# Patient Record
Sex: Female | Born: 1960
Health system: Southern US, Community
[De-identification: ages and names within clinical notes are randomized; demographics above are authoritative.]

## PROBLEM LIST (undated history)

## (undated) DIAGNOSIS — Z9221 Personal history of antineoplastic chemotherapy: Secondary | ICD-10-CM

## (undated) DIAGNOSIS — R011 Cardiac murmur, unspecified: Secondary | ICD-10-CM

## (undated) DIAGNOSIS — Z923 Personal history of irradiation: Secondary | ICD-10-CM

## (undated) DIAGNOSIS — G4730 Sleep apnea, unspecified: Secondary | ICD-10-CM

## (undated) DIAGNOSIS — Z5189 Encounter for other specified aftercare: Secondary | ICD-10-CM

## (undated) DIAGNOSIS — R51 Headache: Secondary | ICD-10-CM

## (undated) DIAGNOSIS — E119 Type 2 diabetes mellitus without complications: Secondary | ICD-10-CM

## (undated) DIAGNOSIS — Z87442 Personal history of urinary calculi: Secondary | ICD-10-CM

## (undated) DIAGNOSIS — I10 Essential (primary) hypertension: Secondary | ICD-10-CM

## (undated) DIAGNOSIS — C801 Malignant (primary) neoplasm, unspecified: Secondary | ICD-10-CM

## (undated) DIAGNOSIS — C50919 Malignant neoplasm of unspecified site of unspecified female breast: Secondary | ICD-10-CM

## (undated) DIAGNOSIS — R519 Headache, unspecified: Secondary | ICD-10-CM

## (undated) DIAGNOSIS — K219 Gastro-esophageal reflux disease without esophagitis: Secondary | ICD-10-CM

## (undated) DIAGNOSIS — IMO0001 Reserved for inherently not codable concepts without codable children: Secondary | ICD-10-CM

## (undated) DIAGNOSIS — E785 Hyperlipidemia, unspecified: Secondary | ICD-10-CM

## (undated) HISTORY — DX: Type 2 diabetes mellitus without complications: E11.9

## (undated) HISTORY — PX: EYE SURGERY: SHX253

## (undated) HISTORY — PX: KIDNEY STONE SURGERY: SHX686

---

## 2002-09-19 ENCOUNTER — Encounter: Payer: Self-pay | Admitting: *Deleted

## 2002-09-19 ENCOUNTER — Ambulatory Visit (HOSPITAL_COMMUNITY): Admission: RE | Admit: 2002-09-19 | Discharge: 2002-09-19 | Payer: Self-pay | Admitting: *Deleted

## 2002-09-20 ENCOUNTER — Other Ambulatory Visit: Admission: RE | Admit: 2002-09-20 | Discharge: 2002-09-20 | Payer: Self-pay | Admitting: *Deleted

## 2002-10-13 HISTORY — PX: ABDOMINAL HYSTERECTOMY: SHX81

## 2002-11-15 ENCOUNTER — Inpatient Hospital Stay (HOSPITAL_COMMUNITY): Admission: RE | Admit: 2002-11-15 | Discharge: 2002-11-18 | Payer: Self-pay | Admitting: *Deleted

## 2006-01-02 ENCOUNTER — Other Ambulatory Visit: Admission: RE | Admit: 2006-01-02 | Discharge: 2006-01-02 | Payer: Self-pay | Admitting: Family Medicine

## 2006-01-02 ENCOUNTER — Encounter (INDEPENDENT_AMBULATORY_CARE_PROVIDER_SITE_OTHER): Payer: Self-pay | Admitting: Specialist

## 2008-06-14 ENCOUNTER — Ambulatory Visit (HOSPITAL_COMMUNITY): Admission: RE | Admit: 2008-06-14 | Discharge: 2008-06-14 | Payer: Self-pay | Admitting: Family Medicine

## 2008-06-14 ENCOUNTER — Encounter (INDEPENDENT_AMBULATORY_CARE_PROVIDER_SITE_OTHER): Payer: Self-pay | Admitting: Family Medicine

## 2009-04-27 ENCOUNTER — Ambulatory Visit (HOSPITAL_COMMUNITY): Admission: RE | Admit: 2009-04-27 | Discharge: 2009-04-27 | Payer: Self-pay | Admitting: Family Medicine

## 2011-02-28 NOTE — Discharge Summary (Signed)
NAME:  Renee Harris, Renee Harris                        ACCOUNT NO.:  [id number]   MEDICAL RECORD NO.:  [id number]                   PATIENT TYPE:  INP   LOCATION:  A426                                 FACILITY:  [facility]   PHYSICIAN:  Langley Gauss, M.D.                DATE OF BIRTH:  26-Dec-1960   DATE OF ADMISSION:  11/15/2002  DATE OF DISCHARGE:  11/18/2002                                 DISCHARGE SUMMARY   DISCHARGE DIAGNOSES:  1. Irregular menses.  2. Menometorrhagia.  3. Pelvic mass.   PROCEDURE PERFORMED:  Total abdominal hysterectomy with an interoperative  estimated blood loss of 600 cc.  Final pathology is currently pending.   DISPOSITION:  The patient is to follow up in the office in four days time  for staple removal from the midline abdominal incision.  JP drains x2 were  removed prior to discharge.   COMPLICATIONS:  Hospitalization was complicated by postoperative ileus  requiring an additional day until bowel functioning resumed.  In addition  there was anemia secondary to intraoperative blood loss.   LABORATORY DATA:  Admission hemoglobin and hematocrit of 10.3/30.9 with a  white count of 6.4.  In the recovery room H&H revealed hemoglobin 10.1,  hematocrit 30.6.  On postoperative day #1 hemoglobin had fallen to 8.1/24.9  with a white count of 7.2 and equilibration has occurred on the day of  discharge to a hemoglobin of 7.9, hematocrit 24.2, with a white count of  8.2.  Certainly the relative anemia has tolerated very well by the patient  with no significant symptoms of anemia.   DISCHARGE MEDICATIONS:  1. Tylox for pain relief.  2. Hemocyte-F 1 p.o. daily #30 with no refills.   HOSPITAL COURSE:  See previous dictations.  The patient had a very large  pelvis mass extending above the umbilicus.  The operative procedure was  complicated by this with a very large leiomyoma removed, weighing a total of  five pounds five ounces as a fresh specimen.  A large midline  abdominal  incision was performed with a three hour time period within the operating  room.  Postoperatively the patient did well with good urine output.  JP  drains drained very well for the first three days.  The patient was very  slow to ambulate initially with minimal ambulation during the first 24  hours.  Thirty-six hours postoperatively the Foley catheter was removed at  which time the patient was able to ambulate and void.  However, thereafter  she had no passage of flatus, has very slow resumption of bowel function,  and did not have any passage of flatus until late in the p.m. of  postoperative day #2.  On  postoperative day #3 the patient was up ambulatory, passing flatus, and has  had resumption of bowel function.  The abdomen is soft, nontender, and  nondistended.  JP drains are removed and the incision is  well approximated.  Thus patient is discharged to home on 11/18/02.  Final pathology is currently  pending.                                               Langley Gauss, M.D.    DC/MEDQ  D:  11/22/2002  T:  11/22/2002  Job:  [id number]   cc:   [facility]

## 2011-02-28 NOTE — Op Note (Signed)
Renee Harris, Renee Harris                          ACCOUNT NO.:  [id number]   MEDICAL RECORD NO.:  [id number]                  PATIENT TYPE:   LOCATION:                                       FACILITY:  [facility]   PHYSICIAN:  Langley Gauss, M.D.                DATE OF BIRTH:   DATE OF PROCEDURE:  10/13/2003  DATE OF DISCHARGE:                                 OPERATIVE REPORT   PREOPERATIVE DIAGNOSIS:   POSTOPERATIVE DIAGNOSIS:   PROCEDURE:  Total abdominal hysterectomy.   SURGEON:  Langley Gauss, M.D.   ASSISTANT:   ANESTHESIA:   ESTIMATED BLOOD LOSS:  650 mL.   SPECIMENS:  Uterine fundus and cervix sent as a separate specimen.   COMPLICATIONS:  None.   DRAINS:  JP catheters left within the subcutaneous space x2.  In addition, a  Foley catheter was left to straight drainage with findings of clear, yellow  urine.   FINDINGS:  The findings at the time of surgery include a very large uterine  fundus, normal appearing ovaries bilaterally.  In addition, the patient is  noted to be morbidly obese, being 61 inches tall and weighing 280 pounds.  Thus, the surgical technical difficulty is markedly increased due to these  factors.  The total time in the operating room is three hours rather than  the expected two hours for a hysterectomy performed on a typical patient.  A  very large midline abdominal incision was required to be utilized which  extends up above the umbilicus.  The tumor itself was difficult to  manipulate both during the case and to deliver through this uterine  incision.  The fresh weight of the uterine cervix and fundus in the  pathology lab is 5 pounds and 5 ounces.  The patient was met preoperatively  in the holding area at which time planned operative procedure was discussed  with the patient.  She does desire to maintain her ovaries if possible  during the operative procedure.  However, she was aware that the ovaries  could require removal during the operative  procedure.   DESCRIPTION OF PROCEDURE:  Palpation of the patient's uterus in the  immediate holding area reveals that the pelvic mass does extend up above the  level of the umbilicus and she is noted to have a large panniculus.  The  patient was then taken to the operating room.  Vital signs were stable.  The  patient underwent uncomplicated induction of general endotracheal anesthesia  after which time she was prepped and draped in the usual sterile manner.  A  Foley catheter was placed to straight drainage with findings of clear yellow  urine.  A midline abdominal incision was performed up to the level of the  umbilicus dissected down to the fascial plane utilizing the sharp knife and  cauterizing bleeders along the way.  Of note, is the very large panniculus  and the  immediate depth of subcutaneous fat until reaching the fascial plane  was 8 inches.  After identification of the fascia, this was incised in a  vertical manner while sharply dissecting off the underlying rectus muscles.  The rectus muscles were then bluntly separated.  The fascial incision was  extended superiorly and inferiorly down to the level of the pubic symphysis.  After separation of the rectus muscles, I was able to atraumatically bluntly  enter  the peritoneum at the superior most portion of the incision.  The  peritoneal incision was then extended superiorly and inferiorly down to the  level of the pubic symphysis.  The bladder was directly visualized and  palpated to avoid __________ injury.  At this point in time immediately  within the pelvic and abdominal cavity I see a large pelvic mass as  described previously.  This occupies the entirety of the pelvis and does  extend up above the level of the umbilicus.  Thus it was required to extend  the skin incision extending to the left of the umbilicus a distance of about  3 cm above the umbilicus.   At this point the patient was placed in Trendelenburg position.   Only a  small amount of Trendelenburg position was possible due to the patient's  morbid obesity and high airway pressures, which she ran due to the morbid  obesity and weight of her abdominal wall.  Balfour retractor was then  utilized using the deepest blades possible.  However, these were not deep  enough secondary to the large abdominal girth.  This difficulty in  retraction further complicated the operative procedure.  In an effort to  improve the visibility, I did place retention type sutures along the lateral  borders of the skin incision through and through the skin and down to the  fascia and into the perineum which helped compress the subcutaneous fat  which then allowed the Balfour retractor to actually assist in the  retraction of the lateral abdominal wall.  A deep bladder blade was likewise  utilized to mobilize bladder and inferior most peritoneum off the surgical  field.  Moist packs were then used to mobilize bowel out of the upper field.  However, with great difficulty I was able to manipulate the pelvic mass.  Long straight Kocher clamps were placed through the junction of the  fallopian tubes and the round ligament with the uterus itself.   At this point, I proceeded with the hysterectomy in a very cautious manner.  The right round ligament was secured first, utilizing first 0 Vicryl suture  in a Heaney fashion.  After transection of the right round ligament, I  attempted to skeletonize the utero-ovarian ligament on the right.  The right  ovary was noted to be normal in appearance.  The right utero-ovarian  ligament was then doubly clamped with Kelly clamps followed by double  ligation with 0 Vicryl free tie followed by 0 Vicryl ligature in a Heaney  fashion to result in hemostasis.  These sutures were all then cut.  The left  portion of the pelvic mass had limited visualization secondary to again the very large size of the pelvic mass and proceeded in the likewise  manner in  that the left round ligament was ligated with 0 Vicryl in a Heaney fashion.  It was then transected, and the utero-ovarian ligament on the left was  skeletonized.  This was very difficult secondary to the limited  visualization.  The left utero-ovarian ligament was  then doubly clamped with  Kelly clamps followed by double ligation, first with a free tie of 0 Vicryl  followed by a suture ligature of 0 Vicryl in a Heaney fashion.  This  resulted in hemostasis of the utero-ovarian ligament itself.  However, there  was noted to be marked thickening of the broad ligament on the left.  In  addition, the utero-ovarian ligament on the left was very short with the  left ovary in close proximity to the uterus itself.  Thus an initial curved  Heaney clamp is utilized to sequentially clamp across the broad ligament on  the left continuing inferiorly towards the uterine vessel.  An additional  three clamping and ligatures are required on the left side in an attempt to  maintain hemostasis.   At this point in time with great difficulty, I was able to push the large  uterine fundus upwards toward the abdomen, which allows me to visualize the  lower uterine segment.  A bladder flap is then created from the  vesicouterine fold utilizing sharp dissection in the avascular plane.  This  then allows a bladder flap to be dissected off of the anterior lower uterine  segment and then bluntly dissected free and down distal to the cervix  itself.  Each of the uterine vessels were then carefully skeletonized.  The  right is ligated first.  A Kelly clamp is initially placed to control any  backbleeding followed by a curved Heaney clamp followed by a single suture  ligature of 0 Vicryl in a simple fashion.  This secures the uterine vessel  on the right and on the left a Kelly clamp was not required for  backbleeding.  A curved Heaney clamp is placed across the left uterine  vessel followed by suture  ligature of 0 Vicryl in a simple fashion.  This  resulted in hemostasis of the left uterine vessel.  Backbleeding was noted  to be occurring.  Thus a second curved Heaney clamp was placed just distal  to this along the cervix, very close to it and a 0 Vicryl suture was placed  to secure the uterine vessel on the left.  Thus, the major vascular supply  to the uterus was controlled was well as any significant backbleeding also  controlled.  This then allows me to proceed with a supracervical  hysterectomy by using a sharp knife to dissect across the junction of the  fundus with the cervix itself.  The large fundal portion is then handed off  as a separate specimen and a straight Zannie Cove is used to grasp the truncated  cervix.  At this point in time the visualization is very much improved with  the removal of the large pelvic mass.  Thus I was able to easily transect  the remainder of the ligaments.  A straight Heaney clamp is placed on the right followed by a suture ligature of 0 Vicryl in a Heaney fashion to  secure the cardinal ligament on the right.  Likewise cardinal ligament on  the left was secured with a straight Heaney clamp followed by a suture  ligature of 0 Vicryl in a Heaney fashion.  An additional straight Heaney  clamp is placed on the right and then the left to secure the uterosacral  ligaments.  This then brought me down to about the level of the vaginal  angles.  The right vaginal angle is clamped with a curved Heaney clamp  followed by suture ligatures of 0 Vicryl in  a Heaney fashion.  This was  tagged for later inspection.  The left vaginal angle was clamped with a  curved Heaney clamp followed by suture ligature of Vicryl in a Heaney  fashion.  This likewise is tagged for later inspection.  A second curved  Heaney clamp was placed on the right and then the left to clamp across the  vaginal cuff at the distal most portion of the cervix.  Each of these is  then ligated  utilizing 0 Vicryl in a Heaney fashion.  These likewise are  clamped for later inspection.  This then allowed me to transect across the  remainder of the vaginal cuff in the midline maintaining maximal vaginal  length while removing the entirety of the cervix.  The specimen is examined.  The entire cervix is noted to be incorporated and included in this last  specimen.  This then allows me to grasp the remainder of the vaginal cuff  utilizing straight Kocher clamps.  Irrigation of the upper vaginal area is  then performed utilizing Betadine solution.  The remainder of the vaginal  cuff was then closed utilizing a 0 Vicryl suture in a figure-of-eight  fashion.  This then resulted in complete closure of the vaginal cuff while  maintaining maximal vaginal length.  With the specimen removed, irrigation  of the pelvic cavity is now possible.   Irrigation is performed utilizing a sterile normal saline solution.  There  was noted to be a very small amount of active bleeding from the free  perineal edge at the midline of the vaginal cuff.  This was then closed by  placing a single figure-of-eight suture here which helps reperitonealized  over the suture line.  Copious irrigation is then again repeated until  hemostasis is assured.  At this point in time, our Balfour retractor is  removed.  Sponge and instrument counts are correct x 2 at this point  following removal of the packs from the abdomen.  The large bowel was then  allowed to fall within the pelvic cavity.  The peritoneal edges were then  grasped using Kelly clamps and a single layer closure was performed  utilizing a #1 PDS double strand looped suture.  This was a through and  through suture extending through the fascia through the rectus muscle  underlying and incorporating the overlying fascia.  The initial suture is  tied in the midportion of the incision.  A second suture is required to  continue the closure down to the pubic  symphysis.  This was done with complications.  An excellent and secure closure is performed.  An additional  #1 PDS suture is utilized in the midline in the incision to more closely  reapproximate the fascial edges.  This is a more superficial suture  incorporating just the fascial edges after which time it is assured that a  secure closure has been performed thus subcutaneous bleeder cauterized.  A  JP drain x2 is placed in the subcutaneous space with two exit sites at the  superior most portion of the incision.  Each of these was then separately  sutured into place.  Interrupted #1 PDS sutures were then placed through and  through skin edges to function as retention type sutures which likewise  helped reapproximate the skin in the midline along the skin incision.  Following this is was a very easy matter to proceed with a skin stapler to  completely close the skin incision.  Following complete closure of the skin,  a total of 30 mL of 0.5% bupivacaine plain is injected along the skin  incision just subcutaneously to facilitate with a postoperative analgesia.   The patient tolerated the procedure very well.  She continues to drain clear  yellow urine.  She was then reversed from anesthesia and taken to the  recovery room in stable condition.  Operative findings discussed with the  patient's awaiting family.  Due to the estimated blood loss of 600 mL and a  pre-existing anemia with a hemoglobin of 10.3, a hemoglobin will be assessed  immediately in the recovery room to get a brief idea of the patient's  overall volume and  hemodynamic status.  The patient's family is notified in the recovery room  that there is an increased possibility requiring blood transfusion secondary  to the preexisting anemia and the blood loss encountered during the  difficult surgery.                                               Langley Gauss, M.D.    DC/MEDQ  D:  11/16/2002  T:  11/16/2002  Job:   [id number]

## 2011-03-20 ENCOUNTER — Other Ambulatory Visit (HOSPITAL_COMMUNITY): Payer: Self-pay | Admitting: Family Medicine

## 2011-03-20 ENCOUNTER — Ambulatory Visit (HOSPITAL_COMMUNITY)
Admission: RE | Admit: 2011-03-20 | Discharge: 2011-03-20 | Disposition: A | Discharge status: Home / Self Care | Payer: 59 | Source: Ambulatory Visit | Attending: Family Medicine | Admitting: Family Medicine

## 2011-03-20 DIAGNOSIS — K8020 Calculus of gallbladder without cholecystitis without obstruction: Secondary | ICD-10-CM

## 2011-03-20 DIAGNOSIS — I10 Essential (primary) hypertension: Secondary | ICD-10-CM

## 2011-03-20 DIAGNOSIS — E785 Hyperlipidemia, unspecified: Secondary | ICD-10-CM

## 2011-03-25 ENCOUNTER — Other Ambulatory Visit (HOSPITAL_COMMUNITY): Payer: Self-pay

## 2011-04-01 ENCOUNTER — Ambulatory Visit (HOSPITAL_COMMUNITY)
Admission: RE | Admit: 2011-04-01 | Discharge: 2011-04-01 | Disposition: A | Discharge status: Home / Self Care | Payer: 59 | Source: Ambulatory Visit | Attending: Family Medicine | Admitting: Family Medicine

## 2011-04-01 DIAGNOSIS — K8020 Calculus of gallbladder without cholecystitis without obstruction: Secondary | ICD-10-CM | POA: Insufficient documentation

## 2011-04-01 DIAGNOSIS — R161 Splenomegaly, not elsewhere classified: Secondary | ICD-10-CM | POA: Insufficient documentation

## 2011-04-01 DIAGNOSIS — I10 Essential (primary) hypertension: Secondary | ICD-10-CM

## 2011-04-01 DIAGNOSIS — E785 Hyperlipidemia, unspecified: Secondary | ICD-10-CM

## 2011-05-23 ENCOUNTER — Encounter (HOSPITAL_COMMUNITY)
Admission: RE | Admit: 2011-05-23 | Discharge: 2011-05-23 | Disposition: A | Discharge status: Home / Self Care | Payer: 59 | Source: Ambulatory Visit | Attending: General Surgery | Admitting: General Surgery

## 2011-05-23 ENCOUNTER — Encounter (HOSPITAL_COMMUNITY): Payer: Self-pay

## 2011-05-23 ENCOUNTER — Other Ambulatory Visit: Payer: Self-pay

## 2011-05-23 HISTORY — DX: Cardiac murmur, unspecified: R01.1

## 2011-05-23 HISTORY — DX: Hyperlipidemia, unspecified: E78.5

## 2011-05-23 HISTORY — DX: Encounter for other specified aftercare: Z51.89

## 2011-05-23 HISTORY — DX: Reserved for inherently not codable concepts without codable children: IMO0001

## 2011-05-23 HISTORY — DX: Essential (primary) hypertension: I10

## 2011-05-23 LAB — CBC
HCT: 43.1 % (ref 36.0–46.0)
MCV: 84.3 fL (ref 78.0–100.0)
Platelets: 201 10*3/uL (ref 150–400)
RBC: 5.11 MIL/uL (ref 3.87–5.11)
WBC: 8.7 10*3/uL (ref 4.0–10.5)

## 2011-05-23 LAB — BASIC METABOLIC PANEL
CO2: 24 mEq/L (ref 19–32)
Chloride: 102 mEq/L (ref 96–112)
Sodium: 140 mEq/L (ref 135–145)

## 2011-05-23 LAB — DIFFERENTIAL
Eosinophils Relative: 4 % (ref 0–5)
Lymphocytes Relative: 25 % (ref 12–46)
Lymphs Abs: 2.2 10*3/uL (ref 0.7–4.0)
Monocytes Absolute: 0.6 10*3/uL (ref 0.1–1.0)
Neutro Abs: 5.6 10*3/uL (ref 1.7–7.7)

## 2011-05-23 LAB — SURGICAL PCR SCREEN: Staphylococcus aureus: POSITIVE — AB

## 2011-05-23 MED ORDER — MUPIROCIN 2 % EX OINT
TOPICAL_OINTMENT | CUTANEOUS | Status: AC
  Filled 2011-05-23: qty 22

## 2011-05-23 MED ORDER — LACTATED RINGERS IV SOLN: INTRAVENOUS | Status: DC

## 2011-05-23 NOTE — Patient Instructions (Signed)
20 KEISA BLOW  05/23/2011   Your procedure is scheduled on:  05/30/11  [facility] to [facility].  Call this number if you have problems the morning of surgery: [phone]   Remember:   Do not eat food:After Midnight.  Do not drink clear liquids: After Midnight.  Take these medicines the morning of surgery with A SIP OF WATER: cozaar.  Use albuterol inhaler & bring it with you the day of your surgery.   Do not wear jewelry, make-up or nail polish.  Do not wear lotions, powders, or perfumes. You may wear deodorant.  Do not shave 48 hours prior to surgery.  Do not bring valuables to the hospital.  Contacts, dentures or bridgework may not be worn into surgery.  Leave suitcase in the car. After surgery it may be brought to your room.  For patients admitted to the hospital, checkout time is 11:00 AM the day of discharge.   Patients discharged the day of surgery will not be allowed to drive home.  Name and phone number of your driver: family  Special Instructions: CHG Shower Use Special Wash: 1/2 bottle night before surgery and 1/2 bottle morning of surgery.   Please read over the following fact sheets that you were given: Pain Booklet, MRSA Information, Surgical Site Infection Prevention, Anesthesia Post-op Instructions and Care and Recovery After Surgery   PATIENT INSTRUCTIONS POST-ANESTHESIA  IMMEDIATELY FOLLOWING SURGERY:  Do not drive or operate machinery for the first twenty four hours after surgery.  Do not make any important decisions for twenty four hours after surgery or while taking narcotic pain medications or sedatives.  If you develop intractable nausea and vomiting or a severe headache please notify your doctor immediately.  FOLLOW-UP:  Please make an appointment with your surgeon as instructed. You do not need to follow up with anesthesia unless specifically instructed to do so.  WOUND CARE INSTRUCTIONS (if applicable):  Keep a dry clean dressing on the  anesthesia/puncture wound site if there is drainage.  Once the wound has quit draining you may leave it open to air.  Generally you should leave the bandage intact for twenty four hours unless there is drainage.  If the epidural site drains for more than 36-48 hours please call the anesthesia department.  QUESTIONS?:  Please feel free to call your physician or the hospital operator if you have any questions, and they will be happy to assist you.     [facility] Anesthesia Department [address] [place] [phone]

## 2011-05-29 NOTE — H&P (Signed)
  NTS SOAP Note  Vital Signs:  Vitals as of: 04/22/2011: Systolic 183: Diastolic 112: Heart Rate 77: Temp 98.88F: Height 75ft 1in: Weight 297Lbs 0 Ounces: Pain Level 0: BMI 56  BMI : 56.12 kg/m2  Subjective: This 5 Years 68 Months old Female presents for of flank pain. Pt states has been having intermittant RUQ and R flank pain.  Occ nausea.  No emesis.  No change in BM.  No melena.  No hematochezia.  No jaundice.  SImilar symtoms in the past but increasing events.  Review of Symptoms:  Constitutional:unremarkable Head:unremarkable Eyes:unremarkable Nose/Mouth/Throat:unremarkable Cardiovascular:unremarkable Respiratory:unremarkable Gastrointestinal:unremarkableexcept as per HPI Genitourinary:unremarkable Musculoskeletal:unremarkable Skin:unremarkablenegative Breast:unremarkable Hematolgic/Lymphatic:unremarkable Allergic/Immunologic:unremarkable    Past Medical History:ObtainedReviewed   Past Medical History  Pregnancy Gravida:  0 Pregnancy Para:  0 Surgical History: Hysterectomy, eye surgery Medical Problems: Hypercholesterolemia, HTN Allergies: NKDA Medications: Losartan, benadryl, cozaar   Social History:Obtained   Social History  Preferred Language: English ([place]) Race:  White Ethnicity: Not Hispanic / Latino Age: 50 Years 9 Months Marital Status:  M Alcohol:  Yes, How much  2 per year Recreational drug(s):  No   Smoking Status: Never smoker reviewed on 04/22/2011  Family History:Obtained   Family History  Is there a family history of:DM, CAD   Medication Allergies:   Allergies Insert Code:   Objective Information: General:Well appearing, well nourished in no distress.Obese Skin:no rash or prominent lesions Head:Atraumatic; no masses; no abnormalities Eyes:conjunctiva clear, EOM intact, PERRL Mouth:Mucous membranes moist, no mucosal lesions. Throat:no erythema, exudates  or lesions. Neck:Supple without lymphadenopathy.  Heart:RRR, no murmur Lungs:CTA bilaterally, no wheezes, rhonchi, rales.  Breathing unlabored. Abdomen:Soft, NT/ND, no HSM, no masses.obese Extremities:No deformities, clubbing, cyanosis, or edema.      RUQ u/s:  stones.  No biliary tree dilitation.  No acute findings. Assessment:  Diagnosis &amp; Procedure: DiagnosisCode: 574.10, ProcedureCode: 16109,   Orders:    Plan:  Discussed surgical options.  Will proceed at pt's convience.  Patient Education:Alternative treatments to surgery were discussed with patient (and family).Risks and benefits  of procedure were fully explained to the patient (and family) who gave informed consent. Patient/family questions were addressed.  Follow-up:Pending Surgery

## 2011-05-30 ENCOUNTER — Encounter (HOSPITAL_COMMUNITY): Payer: Self-pay | Admitting: Anesthesiology

## 2011-05-30 ENCOUNTER — Ambulatory Visit (HOSPITAL_COMMUNITY)
Admission: RE | Admit: 2011-05-30 | Discharge: 2011-05-30 | Disposition: A | Discharge status: Home / Self Care | Payer: 59 | Source: Ambulatory Visit | Attending: General Surgery | Admitting: General Surgery

## 2011-05-30 ENCOUNTER — Encounter (HOSPITAL_COMMUNITY): Payer: Self-pay | Admitting: *Deleted

## 2011-05-30 ENCOUNTER — Other Ambulatory Visit: Payer: Self-pay | Admitting: General Surgery

## 2011-05-30 ENCOUNTER — Encounter (HOSPITAL_COMMUNITY)
Admission: RE | Disposition: A | Discharge status: Home / Self Care | Payer: Self-pay | Source: Ambulatory Visit | Attending: General Surgery

## 2011-05-30 ENCOUNTER — Ambulatory Visit (HOSPITAL_COMMUNITY): Payer: 59 | Admitting: Anesthesiology

## 2011-05-30 DIAGNOSIS — Z01810 Encounter for preprocedural cardiovascular examination: Secondary | ICD-10-CM | POA: Insufficient documentation

## 2011-05-30 DIAGNOSIS — K8020 Calculus of gallbladder without cholecystitis without obstruction: Secondary | ICD-10-CM | POA: Insufficient documentation

## 2011-05-30 DIAGNOSIS — I10 Essential (primary) hypertension: Secondary | ICD-10-CM | POA: Insufficient documentation

## 2011-05-30 DIAGNOSIS — Z01812 Encounter for preprocedural laboratory examination: Secondary | ICD-10-CM | POA: Insufficient documentation

## 2011-05-30 DIAGNOSIS — E7800 Pure hypercholesterolemia, unspecified: Secondary | ICD-10-CM | POA: Insufficient documentation

## 2011-05-30 DIAGNOSIS — Z79899 Other long term (current) drug therapy: Secondary | ICD-10-CM | POA: Insufficient documentation

## 2011-05-30 HISTORY — PX: CHOLECYSTECTOMY: SHX55

## 2011-05-30 SURGERY — LAPAROSCOPIC CHOLECYSTECTOMY
Anesthesia: General | Wound class: Clean Contaminated

## 2011-05-30 MED ORDER — FENTANYL CITRATE 0.05 MG/ML IJ SOLN
25.0000 ug | INTRAMUSCULAR | Status: DC | PRN
  Administered 2011-05-30 (×3): 50 ug via INTRAVENOUS

## 2011-05-30 MED ORDER — FENTANYL CITRATE 0.05 MG/ML IJ SOLN
INTRAMUSCULAR | Status: AC
  Administered 2011-05-30: 50 ug via INTRAVENOUS
  Filled 2011-05-30: qty 2

## 2011-05-30 MED ORDER — ROCURONIUM BROMIDE 50 MG/5ML IV SOLN
INTRAVENOUS | Status: AC
  Filled 2011-05-30: qty 1

## 2011-05-30 MED ORDER — PROPOFOL 10 MG/ML IV EMUL
INTRAVENOUS | Status: DC | PRN
  Administered 2011-05-30: 200 mg via INTRAVENOUS

## 2011-05-30 MED ORDER — ONDANSETRON HCL 4 MG/2ML IJ SOLN
4.0000 mg | Freq: Once | INTRAMUSCULAR | Status: AC
  Administered 2011-05-30: 4 mg via INTRAVENOUS

## 2011-05-30 MED ORDER — FENTANYL CITRATE 0.05 MG/ML IJ SOLN
INTRAMUSCULAR | Status: AC
  Filled 2011-05-30: qty 2

## 2011-05-30 MED ORDER — LIDOCAINE HCL 1 % IJ SOLN
INTRAMUSCULAR | Status: DC | PRN
  Administered 2011-05-30: 40 mg via INTRADERMAL

## 2011-05-30 MED ORDER — MIDAZOLAM HCL 2 MG/2ML IJ SOLN
1.0000 mg | INTRAMUSCULAR | Status: DC | PRN
  Administered 2011-05-30: 2 mg via INTRAVENOUS

## 2011-05-30 MED ORDER — GLYCOPYRROLATE 0.2 MG/ML IJ SOLN
INTRAMUSCULAR | Status: AC
  Filled 2011-05-30: qty 2

## 2011-05-30 MED ORDER — GLYCOPYRROLATE 0.2 MG/ML IJ SOLN
INTRAMUSCULAR | Status: AC
  Administered 2011-05-30: 0.2 mg via INTRAVENOUS
  Filled 2011-05-30: qty 1

## 2011-05-30 MED ORDER — HYDROCODONE-ACETAMINOPHEN 5-325 MG PO TABS
ORAL_TABLET | ORAL | Status: AC
  Filled 2011-05-30: qty 1

## 2011-05-30 MED ORDER — ROCURONIUM BROMIDE 100 MG/10ML IV SOLN
INTRAVENOUS | Status: DC | PRN
  Administered 2011-05-30: 5 mg via INTRAVENOUS
  Administered 2011-05-30: 30 mg via INTRAVENOUS
  Administered 2011-05-30: 5 mg via INTRAVENOUS

## 2011-05-30 MED ORDER — ACETAMINOPHEN 325 MG PO TABS: 325.0000 mg | ORAL_TABLET | ORAL | Status: DC | PRN

## 2011-05-30 MED ORDER — CEFAZOLIN SODIUM 1-5 GM-% IV SOLN
INTRAVENOUS | Status: AC
  Filled 2011-05-30: qty 50

## 2011-05-30 MED ORDER — PROPOFOL 10 MG/ML IV EMUL
INTRAVENOUS | Status: AC
  Filled 2011-05-30: qty 20

## 2011-05-30 MED ORDER — GLYCOPYRROLATE 0.2 MG/ML IJ SOLN
0.2000 mg | Freq: Once | INTRAMUSCULAR | Status: AC | PRN
  Administered 2011-05-30: 0.2 mg via INTRAVENOUS

## 2011-05-30 MED ORDER — LACTATED RINGERS IV SOLN
INTRAVENOUS | Status: DC | PRN
  Administered 2011-05-30: 07:00:00 via INTRAVENOUS

## 2011-05-30 MED ORDER — BUPIVACAINE HCL (PF) 0.5 % IJ SOLN
INTRAMUSCULAR | Status: DC | PRN
  Administered 2011-05-30: 10 mL

## 2011-05-30 MED ORDER — HYDROCODONE-ACETAMINOPHEN 5-325 MG PO TABS
ORAL_TABLET | ORAL | Status: AC
  Administered 2011-05-30: 1 via ORAL
  Filled 2011-05-30: qty 1

## 2011-05-30 MED ORDER — ONDANSETRON HCL 4 MG/2ML IJ SOLN: 4.0000 mg | Freq: Once | INTRAMUSCULAR | Status: DC | PRN

## 2011-05-30 MED ORDER — CEFAZOLIN SODIUM 1-5 GM-% IV SOLN: 1.0000 g | INTRAVENOUS | Status: DC

## 2011-05-30 MED ORDER — GLYCOPYRROLATE 0.2 MG/ML IJ SOLN
INTRAMUSCULAR | Status: DC | PRN
  Administered 2011-05-30: .8 mg via INTRAVENOUS

## 2011-05-30 MED ORDER — LACTATED RINGERS IV SOLN
INTRAVENOUS | Status: AC
  Filled 2011-05-30: qty 1000

## 2011-05-30 MED ORDER — LACTATED RINGERS IV SOLN
INTRAVENOUS | Status: DC
  Administered 2011-05-30: 07:00:00 via INTRAVENOUS

## 2011-05-30 MED ORDER — SUCCINYLCHOLINE CHLORIDE 20 MG/ML IJ SOLN
INTRAMUSCULAR | Status: AC
  Filled 2011-05-30: qty 1

## 2011-05-30 MED ORDER — LIDOCAINE HCL (PF) 1 % IJ SOLN
INTRAMUSCULAR | Status: AC
  Filled 2011-05-30: qty 5

## 2011-05-30 MED ORDER — FENTANYL CITRATE 0.05 MG/ML IJ SOLN
INTRAMUSCULAR | Status: DC | PRN
  Administered 2011-05-30 (×4): 50 ug via INTRAVENOUS

## 2011-05-30 MED ORDER — ENOXAPARIN SODIUM 40 MG/0.4ML ~~LOC~~ SOLN
40.0000 mg | Freq: Once | SUBCUTANEOUS | Status: AC
  Administered 2011-05-30: 40 mg via SUBCUTANEOUS

## 2011-05-30 MED ORDER — MIDAZOLAM HCL 2 MG/2ML IJ SOLN
INTRAMUSCULAR | Status: AC
  Administered 2011-05-30: 2 mg via INTRAVENOUS
  Filled 2011-05-30: qty 2

## 2011-05-30 MED ORDER — NEOSTIGMINE METHYLSULFATE 1 MG/ML IJ SOLN
INTRAMUSCULAR | Status: DC | PRN
  Administered 2011-05-30: 4 mg via INTRAMUSCULAR

## 2011-05-30 MED ORDER — BUPIVACAINE HCL (PF) 0.5 % IJ SOLN
INTRAMUSCULAR | Status: AC
  Filled 2011-05-30: qty 30

## 2011-05-30 MED ORDER — FENTANYL CITRATE 0.05 MG/ML IJ SOLN
INTRAMUSCULAR | Status: AC
  Administered 2011-05-30: 50 ug via INTRAVENOUS
  Filled 2011-05-30: qty 5

## 2011-05-30 MED ORDER — ONDANSETRON HCL 4 MG/2ML IJ SOLN
INTRAMUSCULAR | Status: AC
  Administered 2011-05-30: 4 mg via INTRAVENOUS
  Filled 2011-05-30: qty 2

## 2011-05-30 MED ORDER — HYDROCODONE-ACETAMINOPHEN 5-500 MG PO TABS: 1.0000 | ORAL_TABLET | ORAL | Status: DC | PRN

## 2011-05-30 MED ORDER — HYDROCODONE-ACETAMINOPHEN 5-325 MG PO TABS
1.0000 | ORAL_TABLET | Freq: Once | ORAL | Status: AC
  Administered 2011-05-30: 1 via ORAL

## 2011-05-30 MED ORDER — CEFAZOLIN SODIUM 1-5 GM-% IV SOLN
INTRAVENOUS | Status: DC | PRN
  Administered 2011-05-30: 2 g via INTRAVENOUS

## 2011-05-30 MED ORDER — SUCCINYLCHOLINE CHLORIDE 20 MG/ML IJ SOLN
INTRAMUSCULAR | Status: DC | PRN
  Administered 2011-05-30: 140 mg via INTRAVENOUS

## 2011-05-30 MED ORDER — CEFAZOLIN SODIUM 1-5 GM-% IV SOLN: 2.0000 g | Freq: Once | INTRAVENOUS | Status: DC

## 2011-05-30 MED ORDER — ENOXAPARIN SODIUM 40 MG/0.4ML ~~LOC~~ SOLN
SUBCUTANEOUS | Status: AC
  Administered 2011-05-30: 40 mg via SUBCUTANEOUS
  Filled 2011-05-30: qty 0.4

## 2011-05-30 SURGICAL SUPPLY — 44 items
APL SKNCLS STERI-STRIP NONHPOA (GAUZE/BANDAGES/DRESSINGS) ×1
APPLIER CLIP UNV 5X34 EPIX (ENDOMECHANICALS) ×2 IMPLANT
APR XCLPCLP 20M/L UNV 34X5 (ENDOMECHANICALS) ×1
BAG HAMPER (MISCELLANEOUS) ×2 IMPLANT
BAG SPEC RTRVL LRG 6X4 10 (ENDOMECHANICALS) ×1
BENZOIN TINCTURE PRP APPL 2/3 (GAUZE/BANDAGES/DRESSINGS) ×2 IMPLANT
CLOSURE STERI STRIP 1/2 X4 (GAUZE/BANDAGES/DRESSINGS) ×1 IMPLANT
CLOTH BEACON ORANGE TIMEOUT ST (SAFETY) ×2 IMPLANT
COVER LIGHT HANDLE STERIS (MISCELLANEOUS) ×4 IMPLANT
DECANTER SPIKE VIAL GLASS SM (MISCELLANEOUS) ×1 IMPLANT
DEVICE TROCAR PUNCTURE CLOSURE (ENDOMECHANICALS) ×2 IMPLANT
DURAPREP 26ML APPLICATOR (WOUND CARE) ×2 IMPLANT
ELECT REM PT RETURN 9FT ADLT (ELECTROSURGICAL) ×2
ELECTRODE REM PT RTRN 9FT ADLT (ELECTROSURGICAL) ×1 IMPLANT
FILTER SMOKE EVAC LAPAROSHD (FILTER) ×2 IMPLANT
FORMALIN 10 PREFIL 120ML (MISCELLANEOUS) ×2 IMPLANT
GLOVE BIOGEL PI IND STRL 7.0 (GLOVE) IMPLANT
GLOVE BIOGEL PI IND STRL 7.5 (GLOVE) ×1 IMPLANT
GLOVE BIOGEL PI IND STRL 8.5 (GLOVE) IMPLANT
GLOVE BIOGEL PI INDICATOR 7.0 (GLOVE) ×2
GLOVE BIOGEL PI INDICATOR 7.5 (GLOVE) ×1
GLOVE BIOGEL PI INDICATOR 8.5 (GLOVE) ×1
GLOVE ECLIPSE 7.0 STRL STRAW (GLOVE) ×2 IMPLANT
GLOVE ECLIPSE 8.0 STRL XLNG CF (GLOVE) ×1 IMPLANT
GLOVE EXAM NITRILE MD LF STRL (GLOVE) IMPLANT
GLOVE SS BIOGEL STRL SZ 6.5 (GLOVE) IMPLANT
GLOVE SUPERSENSE BIOGEL SZ 6.5 (GLOVE) ×1
GOWN BRE IMP SLV AUR XL STRL (GOWN DISPOSABLE) ×6 IMPLANT
HEMOSTAT SNOW SURGICEL 2X4 (HEMOSTASIS) ×1 IMPLANT
INST SET LAPROSCOPIC AP (KITS) ×2 IMPLANT
IV NS IRRIG 3000ML ARTHROMATIC (IV SOLUTION) ×1 IMPLANT
KIT ROOM TURNOVER APOR (KITS) ×2 IMPLANT
KIT TROCAR LAP CHOLE (TROCAR) ×2 IMPLANT
MANIFOLD NEPTUNE II (INSTRUMENTS) ×2 IMPLANT
PACK LAP CHOLE LZT030E (CUSTOM PROCEDURE TRAY) ×2 IMPLANT
PAD ARMBOARD 7.5X6 YLW CONV (MISCELLANEOUS) ×2 IMPLANT
POUCH SPECIMEN RETRIEVAL 10MM (ENDOMECHANICALS) ×2 IMPLANT
SET BASIN LINEN APH (SET/KITS/TRAYS/PACK) ×2 IMPLANT
SET TUBE IRRIG SUCTION NO TIP (IRRIGATION / IRRIGATOR) IMPLANT
SLEEVE Z-THREAD 5X100MM (TROCAR) ×2 IMPLANT
STRIP CLOSURE SKIN 1/2X4 (GAUZE/BANDAGES/DRESSINGS) ×2 IMPLANT
SUT MNCRL AB 4-0 PS2 18 (SUTURE) ×4 IMPLANT
SUT VIC AB 2-0 CT2 27 (SUTURE) ×4 IMPLANT
WARMER LAPAROSCOPE (MISCELLANEOUS) ×2 IMPLANT

## 2011-05-30 NOTE — Progress Notes (Signed)
Addended by: Roselie Awkward on: 05/30/2011 11:08 AM   Modules accepted: Orders

## 2011-05-30 NOTE — Op Note (Signed)
Patient:  Renee Harris  DOB:  02/05/1961  MRN:  [id number]   Preop Diagnosis:  Cholelithiasis  Postop Diagnosis:  The same  Procedure:  Laparoscopic cholecystectomy  Surgeon:  Dr. Tilford Pillar  Anes:  Gen. endotracheal  Indications:  Patient is a 51 year old female presented my office with a history of epigastric and right upper quadrant abdominal pain. Workup was consistent for cholelithiasis and biliary etiology. Risks benefits and alternatives of a laparoscopic possible open cholecystectomy were discussed at length the patient including but not limited to risk of bleeding, infection, bile leak, small bowel injury, common bile duct injury, intraoperative cardiac and pulmonary events. Patient's questions and concerns were addressed the patient was consented for the planned procedure.  Procedure note:  Patient was taken to the OR she was placed in the supine position on the OR table. General anesthetic was administered and was patient was asleep she was endotracheally intubated by anesthesiology. At this point her abdomen was prepped with DuraPrep solution and draped in standard fashion. A stab incision was created supraumbilically with 11 blade scalpel. A Coker clamp was utilized to dissect down to the anterior abdominal wall fascia. This was then grasped and lifted anteriorly at which point a Veress needle was inserted, saline drop test is utilized to confirm intraperitoneal placement, and then pneumoperitoneum was initiated. Once sufficient pneumoperitoneum was obtained an 11 mm trocar was inserted over a laparoscope allowing visualization the trocar entering into the peritoneal cavity. At this point the remaining trochars were placed a 5 mm trocar in the epigastrium a 5 mm trocar in the midline between the 2 11 mm trochars and a 5 mm trocar was placed in the right lateral abdominal wall. The laparoscope was exchanged for a 5 mm scope. I did have to take down some omental adhesions off the  anterior bowel wall to help with exposure. These were taken down with a combination of blunt and electrocautery dissection. At this point the fundus of the gallbladder was identified as lifted up and over the right lobe of the liver. Blunt [place] dissection was carried out to strip the omental adhesions off the body of the gallbladder as well as to the peritoneal reflection off the infundibulum. This exposed the cystic duct entering into the infundibulum. Cystic artery was also identified this is in an anterior position. A window was created behind the cystic artery 2 endoclips were placed proximally one distally and the cystic artery was divided between 2 most is a clips. Similarly the cystic duct was identified a window was created behind the cystic duct 3 clips were placed proximally to distally and the cystic duct was divided between the proximal and distal clips. At this point electrocautery was utilized to dissect the gallbladder free from the gallbladder fossa. Once free the gallbladder was placed into an Endo Catch bag and placed up and over the right lobe liver. Inspection of the gallbladder fossa demonstrated excellent hemostasis. There is no evidence of any bleeding or bile leak from the endoclips. [name].  Using Endo Close suture passing device a 2-0 Vicryl sutures passed through the 11 mm trocar site. With suture in place the gallbladder was retrieved was removed through the umbilical trocar site and intact Endo Catch bag. To note some blunt and sharp dilatation was required to adequately enlarge the trocar sites to remove the gallbladder. Once the gallbladder was removed was placed in the back table and sent as a permanent specimen to pathology.  At this point the pneumoperitoneum was evacuated. Trochars were removed. The Vicryl suture was secured. Local anesthetic was instilled. A 4-0 Monocryl was utilized to reapproximate the skin edges at all 4 trocar sites.  The skin was washed and dried a moistened dry towel. Benzoin is applied around the incision half inch Steri-Strips are placed. The drapes removed the patient was allowed to come out of general anesthetic was transferred to a regular hospital bed. She is transferred to the postanesthetic care unit in stable condition. At the conclusion of procedure all instrument sponge and needle counts are correct. Patient tolerated procedure extremely well.  Complications:  None  EBL:  Less than 50 mL  Specimen:  Gallbladder

## 2011-05-30 NOTE — Anesthesia Preprocedure Evaluation (Signed)
Anesthesia Evaluation  Name, MR# and DOB Patient awake  General Assessment Comment  Reviewed: Allergy & Precautions, H&P , NPO status , Patient's Chart, lab work & pertinent test results  History of Anesthesia Complications Negative for: history of anesthetic complications  Airway Mallampati: II TM Distance: >3 FB Neck ROM: Full    Dental No notable dental hx.    Pulmonary  asthma    pulmonary exam normalPulmonary Exam Normal     Cardiovascular hypertension, Pt. on medications + Valvular Problems/Murmurs Regular Normal    Neuro/Psych Negative Neurological ROS  Negative Psych ROS  GI/Hepatic/Renal negative GI ROS, negative Liver ROS, and negative Renal ROS (+)       Endo/Other  (+)   Morbid obesity  Abdominal (+) obese,   Musculoskeletal negative musculoskeletal ROS (+)   Hematology negative hematology ROS (+)   Peds  Reproductive/Obstetrics negative OB ROS    Anesthesia Other Findings             Anesthesia Physical Anesthesia Plan  ASA: II  Anesthesia Plan: General   Post-op Pain Management:    Induction: Intravenous  Airway Management Planned: Oral ETT  Additional Equipment:   Intra-op Plan:   Post-operative Plan: Extubation in OR  Informed Consent: I have reviewed the patients History and Physical, chart, labs and discussed the procedure including the risks, benefits and alternatives for the proposed anesthesia with the patient or authorized representative who has indicated his/her understanding and acceptance.     Plan Discussed with: CRNA  Anesthesia Plan Comments:         Anesthesia Quick Evaluation

## 2011-05-30 NOTE — Anesthesia Postprocedure Evaluation (Signed)
  Anesthesia Post-op Note  Patient: Renee Harris  Procedure(s) Performed:  LAPAROSCOPIC CHOLECYSTECTOMY  Patient Location: PACU  Anesthesia Type: General  Level of Consciousness: awake, alert  and oriented  Airway and Oxygen Therapy: Patient Spontanous Breathing  Post-op Pain: mild  Post-op Assessment: Post-op Vital signs reviewed, Patient's Cardiovascular Status Stable and Respiratory Function Stable  Post-op Vital Signs: stable  Complications: No apparent anesthesia complications

## 2011-05-30 NOTE — Anesthesia Procedure Notes (Addendum)
Procedure Name: Intubation Date/Time: 05/30/2011 8:24 AM Performed by: Glynn Octave Pre-anesthesia Checklist: Patient identified, Patient being monitored, Timeout performed, Emergency Drugs available and Suction available Patient Re-evaluated:Patient Re-evaluated prior to inductionOxygen Delivery Method: Circle System Utilized Preoxygenation: Pre-oxygenation with 100% oxygen Intubation Type: IV induction, Rapid sequence and Circoid Pressure applied Laryngoscope Size: Mac and 3 Grade View: Grade II Tube type: Oral Tube size: 7.0 mm Number of attempts: 1 Airway Equipment and Method: stylet Placement Confirmation: ETT inserted through vocal cords under direct vision,  positive ETCO2 and breath sounds checked- equal and bilateral Secured at: 21 cm Tube secured with: Tape Dental Injury: Teeth and Oropharynx as per pre-operative assessment

## 2011-05-30 NOTE — Transfer of Care (Signed)
Immediate Anesthesia Transfer of Care Note  Patient: Renee Harris  Procedure(s) Performed:  LAPAROSCOPIC CHOLECYSTECTOMY  Patient Location: PACU  Anesthesia Type: General  Level of Consciousness: awake, alert  and oriented  Airway & Oxygen Therapy: Patient Spontanous Breathing and Patient connected to face mask oxygen  Post-op Assessment: Report given to PACU RN  Post vital signs: stable  Complications: No apparent anesthesia complications

## 2011-05-30 NOTE — Interval H&P Note (Signed)
Patient seen and evaluated in the preop holding area. No change from history and physical. Discussed surgery again with patient and will plan to proceed with a laparoscopic possible open cholecystectomy as consented.

## 2011-06-04 ENCOUNTER — Encounter (HOSPITAL_COMMUNITY): Payer: Self-pay | Admitting: General Surgery

## 2011-09-05 ENCOUNTER — Ambulatory Visit (HOSPITAL_COMMUNITY)
Admission: RE | Admit: 2011-09-05 | Discharge: 2011-09-05 | Disposition: A | Discharge status: Home / Self Care | Payer: 59 | Source: Ambulatory Visit | Attending: Family Medicine | Admitting: Family Medicine

## 2011-09-05 ENCOUNTER — Other Ambulatory Visit (HOSPITAL_COMMUNITY): Payer: Self-pay | Admitting: Family Medicine

## 2011-09-05 DIAGNOSIS — IMO0002 Reserved for concepts with insufficient information to code with codable children: Secondary | ICD-10-CM | POA: Insufficient documentation

## 2011-09-05 DIAGNOSIS — R52 Pain, unspecified: Secondary | ICD-10-CM

## 2011-09-05 DIAGNOSIS — M1710 Unilateral primary osteoarthritis, unspecified knee: Secondary | ICD-10-CM | POA: Insufficient documentation

## 2011-09-05 DIAGNOSIS — M25569 Pain in unspecified knee: Secondary | ICD-10-CM | POA: Insufficient documentation

## 2014-03-16 ENCOUNTER — Emergency Department (HOSPITAL_COMMUNITY)
Admission: EM | Admit: 2014-03-16 | Discharge: 2014-03-16 | Disposition: A | Discharge status: Home / Self Care | Payer: 59 | Attending: Emergency Medicine | Admitting: Emergency Medicine

## 2014-03-16 ENCOUNTER — Encounter (HOSPITAL_COMMUNITY): Payer: Self-pay | Admitting: Emergency Medicine

## 2014-03-16 DIAGNOSIS — I10 Essential (primary) hypertension: Secondary | ICD-10-CM | POA: Insufficient documentation

## 2014-03-16 DIAGNOSIS — Z9089 Acquired absence of other organs: Secondary | ICD-10-CM | POA: Insufficient documentation

## 2014-03-16 DIAGNOSIS — Z8639 Personal history of other endocrine, nutritional and metabolic disease: Secondary | ICD-10-CM | POA: Insufficient documentation

## 2014-03-16 DIAGNOSIS — Z9889 Other specified postprocedural states: Secondary | ICD-10-CM | POA: Insufficient documentation

## 2014-03-16 DIAGNOSIS — Z79899 Other long term (current) drug therapy: Secondary | ICD-10-CM | POA: Insufficient documentation

## 2014-03-16 DIAGNOSIS — J45909 Unspecified asthma, uncomplicated: Secondary | ICD-10-CM | POA: Insufficient documentation

## 2014-03-16 DIAGNOSIS — Z792 Long term (current) use of antibiotics: Secondary | ICD-10-CM | POA: Insufficient documentation

## 2014-03-16 DIAGNOSIS — Z862 Personal history of diseases of the blood and blood-forming organs and certain disorders involving the immune mechanism: Secondary | ICD-10-CM | POA: Insufficient documentation

## 2014-03-16 DIAGNOSIS — N390 Urinary tract infection, site not specified: Secondary | ICD-10-CM | POA: Insufficient documentation

## 2014-03-16 DIAGNOSIS — R011 Cardiac murmur, unspecified: Secondary | ICD-10-CM | POA: Insufficient documentation

## 2014-03-16 DIAGNOSIS — R7309 Other abnormal glucose: Secondary | ICD-10-CM | POA: Insufficient documentation

## 2014-03-16 DIAGNOSIS — Z7982 Long term (current) use of aspirin: Secondary | ICD-10-CM | POA: Insufficient documentation

## 2014-03-16 DIAGNOSIS — R739 Hyperglycemia, unspecified: Secondary | ICD-10-CM

## 2014-03-16 DIAGNOSIS — Z87442 Personal history of urinary calculi: Secondary | ICD-10-CM | POA: Insufficient documentation

## 2014-03-16 LAB — RAPID URINE DRUG SCREEN, HOSP PERFORMED
Amphetamines: NOT DETECTED
Barbiturates: NOT DETECTED
Benzodiazepines: NOT DETECTED
Cocaine: NOT DETECTED
OPIATES: NOT DETECTED
TETRAHYDROCANNABINOL: NOT DETECTED

## 2014-03-16 LAB — CBC WITH DIFFERENTIAL/PLATELET
BASOS ABS: 0 10*3/uL (ref 0.0–0.1)
BASOS PCT: 0 % (ref 0–1)
EOS ABS: 0.2 10*3/uL (ref 0.0–0.7)
EOS PCT: 2 % (ref 0–5)
HEMATOCRIT: 38.5 % (ref 36.0–46.0)
Hemoglobin: 12.4 g/dL (ref 12.0–15.0)
Lymphocytes Relative: 18 % (ref 12–46)
Lymphs Abs: 2.1 10*3/uL (ref 0.7–4.0)
MCH: 26.9 pg (ref 26.0–34.0)
MCHC: 32.2 g/dL (ref 30.0–36.0)
MCV: 83.5 fL (ref 78.0–100.0)
MONO ABS: 1 10*3/uL (ref 0.1–1.0)
Monocytes Relative: 8 % (ref 3–12)
Neutro Abs: 8.6 10*3/uL — ABNORMAL HIGH (ref 1.7–7.7)
Neutrophils Relative %: 72 % (ref 43–77)
Platelets: 222 10*3/uL (ref 150–400)
RBC: 4.61 MIL/uL (ref 3.87–5.11)
RDW: 14 % (ref 11.5–15.5)
WBC: 11.9 10*3/uL — ABNORMAL HIGH (ref 4.0–10.5)

## 2014-03-16 LAB — URINALYSIS, ROUTINE W REFLEX MICROSCOPIC
Bilirubin Urine: NEGATIVE
Glucose, UA: >1000 mg/dL — AB
KETONES UR: NEGATIVE mg/dL
NITRITE: NEGATIVE
PH: 5.5 (ref 5.0–8.0)
Specific Gravity, Urine: 1.015 (ref 1.005–1.030)
Urobilinogen, UA: 0.2 mg/dL (ref 0.0–1.0)

## 2014-03-16 LAB — BASIC METABOLIC PANEL
BUN: 15 mg/dL (ref 6–23)
CALCIUM: 11.1 mg/dL — AB (ref 8.4–10.5)
CO2: 26 mEq/L (ref 19–32)
CREATININE: 0.88 mg/dL (ref 0.50–1.10)
Chloride: 98 mEq/L (ref 96–112)
GFR calc non Af Amer: 75 mL/min — ABNORMAL LOW (ref >90)
GFR, EST AFRICAN AMERICAN: 87 mL/min — AB (ref >90)
Glucose, Bld: 289 mg/dL — ABNORMAL HIGH (ref 70–99)
Potassium: 4.2 mEq/L (ref 3.7–5.3)
Sodium: 139 mEq/L (ref 137–147)

## 2014-03-16 LAB — URINE MICROSCOPIC-ADD ON

## 2014-03-16 MED ORDER — CIPROFLOXACIN HCL 500 MG PO TABS: 500.0000 mg | ORAL_TABLET | Freq: Two times a day (BID) | ORAL | Status: DC

## 2014-03-16 MED ORDER — HYDROCODONE-ACETAMINOPHEN 5-325 MG PO TABS: 2.0000 | ORAL_TABLET | ORAL | Status: DC | PRN

## 2014-03-16 MED ORDER — SODIUM CHLORIDE 0.9 % IV BOLUS (SEPSIS)
1000.0000 mL | Freq: Once | INTRAVENOUS | Status: AC
  Administered 2014-03-16: 1000 mL via INTRAVENOUS

## 2014-03-16 MED ORDER — CEFTRIAXONE SODIUM 1 G IJ SOLR
1.0000 g | Freq: Once | INTRAMUSCULAR | Status: AC
  Administered 2014-03-16: 1 g via INTRAVENOUS
  Filled 2014-03-16: qty 10

## 2014-03-16 MED ORDER — KETOROLAC TROMETHAMINE 30 MG/ML IJ SOLN
30.0000 mg | Freq: Once | INTRAMUSCULAR | Status: AC
  Administered 2014-03-16: 30 mg via INTRAVENOUS
  Filled 2014-03-16: qty 1

## 2014-03-16 MED ORDER — ONDANSETRON 4 MG PO TBDP: 4.0000 mg | ORAL_TABLET | Freq: Three times a day (TID) | ORAL | Status: DC | PRN

## 2014-03-16 MED ORDER — ONDANSETRON HCL 4 MG/2ML IJ SOLN
4.0000 mg | Freq: Once | INTRAMUSCULAR | Status: AC
  Administered 2014-03-16: 4 mg via INTRAVENOUS
  Filled 2014-03-16: qty 2

## 2014-03-16 MED ORDER — MORPHINE SULFATE 4 MG/ML IJ SOLN: 4.0000 mg | INTRAMUSCULAR | Status: DC | PRN

## 2014-03-16 NOTE — Discharge Instructions (Signed)
High Blood Sugar High blood sugar (hyperglycemia) means that the level of sugar in your blood is higher than it should be. Signs of high blood sugar include:  Feeling thirsty.  Frequent peeing (urinating).  Feeling tired or sleepy.  Dry mouth.  Vision changes.  Feeling weak.  Feeling hungry but losing weight.  Numbness and tingling in your hands or feet.  Headache. When you ignore these signs, your blood sugar may keep going up. These problems may get worse, and other problems may begin. HOME CARE  Check your blood sugars as told by your doctor. Write down the numbers with the date and time.  Take the right amount of insulin or diabetes pills at the right time. Write down the dose with date and time.  Refill your insulin or diabetes pills before running out.  Watch what you eat. Follow your meal plan.  Drink liquids without sugar, such as water. Check with your doctor if you have kidney or heart disease.  Follow your doctor's orders for exercise. Exercise at the same time of day.  Keep your doctor's appointments. GET HELP RIGHT AWAY IF:   You have trouble thinking or are confused.  You have fast breathing with fruity smelling breath.  You pass out (faint).  You have 2 to 3 days of high blood sugars and you do not know why.  You have chest pain.  You are feeling sick to your stomach (nauseous) or throwing up (vomiting).  You have sudden vision changes. MAKE SURE YOU:   Understand these instructions.  Will watch your condition.  Will get help right away if you are not doing well or get worse. Document Released: 07/27/2009 Document Revised: 12/22/2011 Document Reviewed: 07/27/2009 [facility] Patient Information 2014 [place], [place].  Urinary Tract Infection A urinary tract infection (UTI) can occur any place along the urinary tract. The tract includes the kidneys, ureters, bladder, and urethra. A type of germ called bacteria often causes a UTI. UTIs are often  helped with antibiotic medicine.  HOME CARE   If given, take antibiotics as told by your doctor. Finish them even if you start to feel better.  Drink enough fluids to keep your pee (urine) clear or pale yellow.  Avoid tea, drinks with caffeine, and bubbly (carbonated) drinks.  Pee often. Avoid holding your pee in for a long time.  Pee before and after having sex (intercourse).  Wipe from front to back after you poop (bowel movement) if you are a woman. Use each tissue only once. GET HELP RIGHT AWAY IF:   You have back pain.  You have lower belly (abdominal) pain.  You have chills.  You feel sick to your stomach (nauseous).  You throw up (vomit).  Your burning or discomfort with peeing does not go away.  You have a fever.  Your symptoms are not better in 3 days. MAKE SURE YOU:   Understand these instructions.  Will watch your condition.  Will get help right away if you are not doing well or get worse. Document Released: 03/17/2008 Document Revised: 06/23/2012 Document Reviewed: 04/29/2012 [facility] Patient Information 2014 [place], [place].

## 2014-03-16 NOTE — ED Notes (Signed)
PT d/c to home with NAD. 

## 2014-03-16 NOTE — ED Notes (Signed)
Pain rt flank , onset Tuesday, fever 100.2 at home.  Nausea, no vomiting, No cough.

## 2014-03-16 NOTE — ED Provider Notes (Signed)
CSN: [id number]     Arrival date & time 03/16/14  1127 History   First MD Initiated Contact with Patient 03/16/14 1310     Chief Complaint  Patient presents with  . Flank Pain      HPI  Vision presents with flank pain and 100.2 temperature at home today. States she has not felt well since yesterday with poor appetite the pain today. She's had kidney stones in the past. States the pain that she feels now is not nearly as bad as when she's had stones. Has had urinary frequency for 48 hours as well. History of high blood sugars. Is not currently medicated for that.  Past Medical History  Diagnosis Date  . Hypertension   . Hyperlipidemia   . Heart murmur   . Asthma   . Blood transfusion     as child  . Normal echocardiogram    Past Surgical History  Procedure Laterality Date  . Abdominal hysterectomy  2004  . Eye surgery      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, on both eyes  . Cholecystectomy  05/30/2011    Procedure: LAPAROSCOPIC CHOLECYSTECTOMY;  Surgeon: Donato Heinz;  Location: [facility];  Service: General;  Laterality: N/A;   Family History  Problem Relation Age of Onset  . Anesthesia problems Neg Hx   . Hypotension Neg Hx   . Malignant hyperthermia Neg Hx   . Pseudochol deficiency Neg Hx    History  Substance Use Topics  . Smoking status: Never Smoker   . Smokeless tobacco: Not on file  . Alcohol Use: Yes     Comment: 2 x year   OB History   Grav Para Term Preterm Abortions TAB SAB Ect Mult Living                 Review of Systems  Constitutional: Negative for fever, chills, diaphoresis, appetite change and fatigue.  HENT: Negative for mouth sores, sore throat and trouble swallowing.   Eyes: Negative for visual disturbance.  Respiratory: Negative for cough, chest tightness, shortness of breath and wheezing.   Cardiovascular: Negative for chest pain.  Gastrointestinal: Negative for nausea, vomiting, abdominal pain, diarrhea and abdominal distention.  Endocrine:  Negative for polydipsia, polyphagia and polyuria.  Genitourinary: Positive for frequency and flank pain. Negative for dysuria and hematuria.  Musculoskeletal: Negative for gait problem.  Skin: Negative for color change, pallor and rash.  Neurological: Negative for dizziness, syncope, light-headedness and headaches.  Hematological: Does not bruise/bleed easily.  Psychiatric/Behavioral: Negative for behavioral problems and confusion.      Allergies  Pyridostigmine bromide  Home Medications   Prior to Admission medications   Medication Sig Start Date End Date Taking? Authorizing Provider  albuterol (PROVENTIL HFA;VENTOLIN HFA) 108 (90 BASE) MCG/ACT inhaler Inhale 1 puff into the lungs every 6 (six) hours as needed. For shortness of breath    Yes Historical Provider, MD  aspirin EC 81 MG tablet Take 81 mg by mouth daily.   Yes Historical Provider, MD  Cholecalciferol (VITAMIN D) 2000 UNITS tablet Take 2,000 Units by mouth daily.     Yes Historical Provider, MD  diphenhydrAMINE (BENADRYL) 25 mg capsule Take 25 mg by mouth every 6 (six) hours as needed. For allergies    Yes Historical Provider, MD  fish oil-omega-3 fatty acids 1000 MG capsule Take 2 g by mouth daily.     Yes Historical Provider, MD  ibuprofen (ADVIL,MOTRIN) 200 MG tablet Take 600 mg by mouth every 8 (eight)  hours as needed. For pain    Yes Historical Provider, MD  losartan (COZAAR) 50 MG tablet Take 50 mg by mouth daily.     Yes Historical Provider, MD  ciprofloxacin (CIPRO) 500 MG tablet Take 1 tablet (500 mg total) by mouth every 12 (twelve) hours. 03/16/14   Tanna Furry, MD  HYDROcodone-acetaminophen (NORCO/VICODIN) 5-325 MG per tablet Take 2 tablets by mouth every 4 (four) hours as needed. 03/16/14   Tanna Furry, MD  ondansetron (ZOFRAN ODT) 4 MG disintegrating tablet Take 1 tablet (4 mg total) by mouth every 8 (eight) hours as needed for nausea. 03/16/14   Tanna Furry, MD   BP 105/50  Pulse 78  Temp(Src) 99.5 F (37.5 C) (Oral)   Resp 18  Ht 5\' 1"  (1.549 m)  Wt 288 lb 8 oz (130.863 kg)  BMI 54.54 kg/m2  SpO2 97% Physical Exam  Constitutional: She is oriented to person, place, and time. She appears well-developed and well-nourished. No distress.  HENT:  Head: Normocephalic.  Eyes: Conjunctivae are normal. Pupils are equal, round, and reactive to light. No scleral icterus.  Neck: Normal range of motion. Neck supple. No thyromegaly present.  Cardiovascular: Normal rate and regular rhythm.  Exam reveals no gallop and no friction rub.   No murmur heard. Pulmonary/Chest: Effort normal and breath sounds normal. No respiratory distress. She has no wheezes. She has no rales.  Abdominal: Soft. Bowel sounds are normal. She exhibits no distension. There is tenderness. There is no rebound.  Right flank tenderness.  Musculoskeletal: Normal range of motion.  Neurological: She is alert and oriented to person, place, and time.  Skin: Skin is warm and dry. No rash noted.  Psychiatric: She has a normal mood and affect. Her behavior is normal.    ED Course  Procedures (including critical care time) Labs Review Labs Reviewed  URINALYSIS, ROUTINE W REFLEX MICROSCOPIC - Abnormal; Notable for the following:    Glucose, UA >1000 (*)    Hgb urine dipstick MODERATE (*)    Protein, ur TRACE (*)    Leukocytes, UA SMALL (*)    All other components within normal limits  URINE MICROSCOPIC-ADD ON - Abnormal; Notable for the following:    Bacteria, UA FEW (*)    All other components within normal limits  CBC WITH DIFFERENTIAL - Abnormal; Notable for the following:    WBC 11.9 (*)    Neutro Abs 8.6 (*)    All other components within normal limits  BASIC METABOLIC PANEL - Abnormal; Notable for the following:    Glucose, Bld 289 (*)    Calcium 11.1 (*)    GFR calc non Af Amer 75 (*)    GFR calc Af Amer 87 (*)    All other components within normal limits  URINE RAPID DRUG SCREEN (HOSP PERFORMED)    Imaging Review No results  found.   EKG Interpretation None      MDM   Final diagnoses:  Urinary tract infection  Hyperglycemia    Patient's blood sugar is elevated. However, she is not acidotic. She states she is aware of her sugars. States that "my doctor and I are watching them". Her symptoms are under good control here. Plan will be home treatment for urinary tract infection with Cipro. Primary care followup in 48 hours check culture results. Recheck ER with worse symptoms.    Tanna Furry, MD 03/16/14 1538

## 2014-07-28 ENCOUNTER — Other Ambulatory Visit: Payer: Self-pay

## 2015-11-02 ENCOUNTER — Encounter (HOSPITAL_COMMUNITY): Payer: Self-pay

## 2015-11-02 ENCOUNTER — Emergency Department (HOSPITAL_COMMUNITY)
Admission: EM | Admit: 2015-11-02 | Discharge: 2015-11-02 | Disposition: A | Discharge status: Home / Self Care | Payer: 59 | Attending: Emergency Medicine | Admitting: Emergency Medicine

## 2015-11-02 ENCOUNTER — Emergency Department (HOSPITAL_COMMUNITY): Payer: 59

## 2015-11-02 DIAGNOSIS — K5900 Constipation, unspecified: Secondary | ICD-10-CM | POA: Diagnosis not present

## 2015-11-02 DIAGNOSIS — Z79899 Other long term (current) drug therapy: Secondary | ICD-10-CM | POA: Insufficient documentation

## 2015-11-02 DIAGNOSIS — Z8639 Personal history of other endocrine, nutritional and metabolic disease: Secondary | ICD-10-CM | POA: Insufficient documentation

## 2015-11-02 DIAGNOSIS — R011 Cardiac murmur, unspecified: Secondary | ICD-10-CM | POA: Insufficient documentation

## 2015-11-02 DIAGNOSIS — R0981 Nasal congestion: Secondary | ICD-10-CM | POA: Insufficient documentation

## 2015-11-02 DIAGNOSIS — N201 Calculus of ureter: Secondary | ICD-10-CM | POA: Insufficient documentation

## 2015-11-02 DIAGNOSIS — Z7982 Long term (current) use of aspirin: Secondary | ICD-10-CM | POA: Diagnosis not present

## 2015-11-02 DIAGNOSIS — J45909 Unspecified asthma, uncomplicated: Secondary | ICD-10-CM | POA: Diagnosis not present

## 2015-11-02 DIAGNOSIS — N390 Urinary tract infection, site not specified: Secondary | ICD-10-CM | POA: Insufficient documentation

## 2015-11-02 DIAGNOSIS — M545 Low back pain, unspecified: Secondary | ICD-10-CM

## 2015-11-02 DIAGNOSIS — I10 Essential (primary) hypertension: Secondary | ICD-10-CM | POA: Diagnosis not present

## 2015-11-02 DIAGNOSIS — R109 Unspecified abdominal pain: Secondary | ICD-10-CM

## 2015-11-02 LAB — CBC WITH DIFFERENTIAL/PLATELET
Basophils Absolute: 0 10*3/uL (ref 0.0–0.1)
Basophils Relative: 0 %
EOS ABS: 0.1 10*3/uL (ref 0.0–0.7)
EOS PCT: 1 %
HCT: 39.2 % (ref 36.0–46.0)
Hemoglobin: 12.9 g/dL (ref 12.0–15.0)
LYMPHS ABS: 1.2 10*3/uL (ref 0.7–4.0)
LYMPHS PCT: 7 %
MCH: 27.6 pg (ref 26.0–34.0)
MCHC: 32.9 g/dL (ref 30.0–36.0)
MCV: 83.8 fL (ref 78.0–100.0)
MONO ABS: 1.2 10*3/uL — AB (ref 0.1–1.0)
Monocytes Relative: 8 %
Neutro Abs: 13.6 10*3/uL — ABNORMAL HIGH (ref 1.7–7.7)
Neutrophils Relative %: 84 %
PLATELETS: 253 10*3/uL (ref 150–400)
RBC: 4.68 MIL/uL (ref 3.87–5.11)
RDW: 13.6 % (ref 11.5–15.5)
WBC: 16.1 10*3/uL — ABNORMAL HIGH (ref 4.0–10.5)

## 2015-11-02 LAB — URINALYSIS, ROUTINE W REFLEX MICROSCOPIC
BILIRUBIN URINE: NEGATIVE
GLUCOSE, UA: NEGATIVE mg/dL
Nitrite: NEGATIVE
PROTEIN: 100 mg/dL — AB
Specific Gravity, Urine: 1.02 (ref 1.005–1.030)
pH: 6 (ref 5.0–8.0)

## 2015-11-02 LAB — BASIC METABOLIC PANEL
Anion gap: 10 (ref 5–15)
BUN: 16 mg/dL (ref 6–20)
CHLORIDE: 101 mmol/L (ref 101–111)
CO2: 26 mmol/L (ref 22–32)
CREATININE: 1.07 mg/dL — AB (ref 0.44–1.00)
Calcium: 9.7 mg/dL (ref 8.9–10.3)
GFR calc Af Amer: >60 mL/min (ref >60)
GFR, EST NON AFRICAN AMERICAN: 59 mL/min — AB (ref >60)
GLUCOSE: 267 mg/dL — AB (ref 65–99)
POTASSIUM: 4.3 mmol/L (ref 3.5–5.1)
SODIUM: 137 mmol/L (ref 135–145)

## 2015-11-02 LAB — URINE MICROSCOPIC-ADD ON

## 2015-11-02 MED ORDER — ONDANSETRON 4 MG PO TBDP
4.0000 mg | ORAL_TABLET | Freq: Once | ORAL | Status: AC
  Administered 2015-11-02: 4 mg via ORAL
  Filled 2015-11-02: qty 1

## 2015-11-02 MED ORDER — HYDROCODONE-ACETAMINOPHEN 5-325 MG PO TABS: 1.0000 | ORAL_TABLET | Freq: Four times a day (QID) | ORAL | Status: DC | PRN

## 2015-11-02 MED ORDER — HYDROMORPHONE HCL 2 MG/ML IJ SOLN
2.0000 mg | Freq: Once | INTRAMUSCULAR | Status: AC
  Administered 2015-11-02: 2 mg via INTRAMUSCULAR
  Filled 2015-11-02: qty 1

## 2015-11-02 MED ORDER — ONDANSETRON 4 MG PO TBDP: 4.0000 mg | ORAL_TABLET | Freq: Three times a day (TID) | ORAL | Status: DC | PRN

## 2015-11-02 MED ORDER — LIDOCAINE HCL (PF) 1 % IJ SOLN
INTRAMUSCULAR | Status: AC
  Administered 2015-11-02: 2.1 mL
  Filled 2015-11-02: qty 5

## 2015-11-02 MED ORDER — CEPHALEXIN 500 MG PO CAPS: 500.0000 mg | ORAL_CAPSULE | Freq: Four times a day (QID) | ORAL | Status: DC

## 2015-11-02 MED ORDER — CEFTRIAXONE SODIUM 1 G IJ SOLR
1.0000 g | Freq: Once | INTRAMUSCULAR | Status: AC
  Administered 2015-11-02: 1 g via INTRAMUSCULAR
  Filled 2015-11-02: qty 10

## 2015-11-02 NOTE — ED Provider Notes (Addendum)
CSN: [id number]     Arrival date & time 11/02/15  1704 History   First MD Initiated Contact with Patient 11/02/15 1952     Chief Complaint  Patient presents with  . Back Pain     (Consider location/radiation/quality/duration/timing/severity/associated sxs/prior Treatment) Patient is a 55 y.o. female presenting with back pain. The history is provided by the patient.  Back Pain Associated symptoms: abdominal pain   Associated symptoms: no chest pain, no dysuria and no fever    patient with one-week history of right-sided low back pain radiating to the right flank. Maybe a little bit to the right lower quadrant. Associated with nausea no vomiting. Patient has a history of kidney stones. Feels like she passed one yesterday but she still having pain. States that her urine is cloudy but no evidence of blood and no pain with urination. Patient's past medical history is known for hypertension hyperlipidemia and asthma. Status post gallbladder removal as well as hysterectomy.  Past Medical History  Diagnosis Date  . Hypertension   . Hyperlipidemia   . Heart murmur   . Asthma   . Blood transfusion     as child  . Normal echocardiogram    Past Surgical History  Procedure Laterality Date  . Abdominal hysterectomy  2004  . Eye surgery      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, on both eyes  . Cholecystectomy  05/30/2011    Procedure: LAPAROSCOPIC CHOLECYSTECTOMY;  Surgeon: Donato Heinz;  Location: [facility];  Service: General;  Laterality: N/A;   Family History  Problem Relation Age of Onset  . Anesthesia problems Neg Hx   . Hypotension Neg Hx   . Malignant hyperthermia Neg Hx   . Pseudochol deficiency Neg Hx    Social History  Substance Use Topics  . Smoking status: Never Smoker   . Smokeless tobacco: None  . Alcohol Use: Yes     Comment: 2 x year   OB History    No data available     Review of Systems  Constitutional: Negative for fever.  HENT: Positive for congestion.   Eyes:  Negative for redness.  Respiratory: Negative for shortness of breath.   Cardiovascular: Negative for chest pain.  Gastrointestinal: Positive for nausea, abdominal pain and constipation. Negative for vomiting.  Genitourinary: Positive for flank pain. Negative for dysuria and hematuria.  Musculoskeletal: Positive for back pain.  Skin: Negative for rash.  Neurological: Negative for syncope.  Hematological: Does not bruise/bleed easily.  Psychiatric/Behavioral: Negative for confusion.      Allergies  Pyridostigmine bromide  Home Medications   Prior to Admission medications   Medication Sig Start Date End Date Taking? Authorizing Provider  albuterol (PROVENTIL HFA;VENTOLIN HFA) 108 (90 BASE) MCG/ACT inhaler Inhale 1 puff into the lungs every 6 (six) hours as needed. For shortness of breath     Historical Provider, MD  aspirin EC 81 MG tablet Take 81 mg by mouth daily.    Historical Provider, MD  Cholecalciferol (VITAMIN D) 2000 UNITS tablet Take 2,000 Units by mouth daily.      Historical Provider, MD  ciprofloxacin (CIPRO) 500 MG tablet Take 1 tablet (500 mg total) by mouth every 12 (twelve) hours. 03/16/14   Tanna Furry, MD  diphenhydrAMINE (BENADRYL) 25 mg capsule Take 25 mg by mouth every 6 (six) hours as needed. For allergies     Historical Provider, MD  fish oil-omega-3 fatty acids 1000 MG capsule Take 2 g by mouth daily.  Historical Provider, MD  HYDROcodone-acetaminophen (NORCO/VICODIN) 5-325 MG per tablet Take 2 tablets by mouth every 4 (four) hours as needed. 03/16/14   Tanna Furry, MD  ibuprofen (ADVIL,MOTRIN) 200 MG tablet Take 600 mg by mouth every 8 (eight) hours as needed. For pain     Historical Provider, MD  losartan (COZAAR) 50 MG tablet Take 50 mg by mouth daily.      Historical Provider, MD  ondansetron (ZOFRAN ODT) 4 MG disintegrating tablet Take 1 tablet (4 mg total) by mouth every 8 (eight) hours as needed for nausea. 03/16/14   Tanna Furry, MD   BP 130/75 mmHg  Pulse  102  Temp(Src) 99.4 F (37.4 C) (Tympanic)  Resp 20  Ht 5\' 1"  (1.549 m)  Wt 127.869 kg  BMI 53.29 kg/m2  SpO2 98% Physical Exam  Constitutional: She is oriented to person, place, and time. She appears well-developed and well-nourished. No distress.  HENT:  Head: Normocephalic and atraumatic.  Mouth/Throat: Oropharynx is clear and moist.  Eyes: Conjunctivae and EOM are normal. Pupils are equal, round, and reactive to light.  Neck: Normal range of motion.  Cardiovascular: Normal rate, regular rhythm and normal heart sounds.   No murmur heard. Pulmonary/Chest: Effort normal and breath sounds normal. No respiratory distress.  Abdominal: Soft. Bowel sounds are normal. There is no tenderness.  No tenderness to palpation of the right flank or right lower quadrant.  Musculoskeletal: Normal range of motion. She exhibits no tenderness.  No tenderness to palpation of the back midline right or left side.  Neurological: She is alert and oriented to person, place, and time. No cranial nerve deficit. She exhibits normal muscle tone. Coordination normal.  Skin: Skin is warm. No erythema.  Nursing note and vitals reviewed.   ED Course  Procedures (including critical care time) Labs Review Labs Reviewed  URINALYSIS, ROUTINE W REFLEX MICROSCOPIC (NOT AT [facility]) - Abnormal; Notable for the following:    APPearance CLOUDY (*)    Hgb urine dipstick LARGE (*)    Ketones, ur TRACE (*)    Protein, ur 100 (*)    Leukocytes, UA LARGE (*)    All other components within normal limits  BASIC METABOLIC PANEL - Abnormal; Notable for the following:    Glucose, Bld 267 (*)    Creatinine, Ser 1.07 (*)    GFR calc non Af Amer 59 (*)    All other components within normal limits  CBC WITH DIFFERENTIAL/PLATELET - Abnormal; Notable for the following:    WBC 16.1 (*)    Neutro Abs 13.6 (*)    Monocytes Absolute 1.2 (*)    All other components within normal limits  URINE MICROSCOPIC-ADD ON - Abnormal; Notable  for the following:    Squamous Epithelial / LPF 6-30 (*)    Bacteria, UA FEW (*)    All other components within normal limits  URINE CULTURE   Results for orders placed or performed during the hospital encounter of 11/02/15  Urinalysis, Routine w reflex microscopic (not at [facility])  Result Value Ref Range   Color, Urine YELLOW YELLOW   APPearance CLOUDY (A) CLEAR   Specific Gravity, Urine 1.020 1.005 - 1.030   pH 6.0 5.0 - 8.0   Glucose, UA NEGATIVE NEGATIVE mg/dL   Hgb urine dipstick LARGE (A) NEGATIVE   Bilirubin Urine NEGATIVE NEGATIVE   Ketones, ur TRACE (A) NEGATIVE mg/dL   Protein, ur 100 (A) NEGATIVE mg/dL   Nitrite NEGATIVE NEGATIVE   Leukocytes, UA LARGE (A) NEGATIVE  Basic metabolic panel  Result Value Ref Range   Sodium 137 135 - 145 mmol/L   Potassium 4.3 3.5 - 5.1 mmol/L   Chloride 101 101 - 111 mmol/L   CO2 26 22 - 32 mmol/L   Glucose, Bld 267 (H) 65 - 99 mg/dL   BUN 16 6 - 20 mg/dL   Creatinine, Ser 1.07 (H) 0.44 - 1.00 mg/dL   Calcium 9.7 8.9 - 10.3 mg/dL   GFR calc non Af Amer 59 (L) >60 mL/min   GFR calc Af Amer >60 >60 mL/min   Anion gap 10 5 - 15  CBC with Differential/Platelet  Result Value Ref Range   WBC 16.1 (H) 4.0 - 10.5 K/uL   RBC 4.68 3.87 - 5.11 MIL/uL   Hemoglobin 12.9 12.0 - 15.0 g/dL   HCT 39.2 36.0 - 46.0 %   MCV 83.8 78.0 - 100.0 fL   MCH 27.6 26.0 - 34.0 pg   MCHC 32.9 30.0 - 36.0 g/dL   RDW 13.6 11.5 - 15.5 %   Platelets 253 150 - 400 K/uL   Neutrophils Relative % 84 %   Neutro Abs 13.6 (H) 1.7 - 7.7 K/uL   Lymphocytes Relative 7 %   Lymphs Abs 1.2 0.7 - 4.0 K/uL   Monocytes Relative 8 %   Monocytes Absolute 1.2 (H) 0.1 - 1.0 K/uL   Eosinophils Relative 1 %   Eosinophils Absolute 0.1 0.0 - 0.7 K/uL   Basophils Relative 0 %   Basophils Absolute 0.0 0.0 - 0.1 K/uL  Urine microscopic-add on  Result Value Ref Range   Squamous Epithelial / LPF 6-30 (A) NONE SEEN   WBC, UA TOO NUMEROUS TO COUNT 0 - 5 WBC/hpf   RBC / HPF TOO NUMEROUS  TO COUNT 0 - 5 RBC/hpf   Bacteria, UA FEW (A) NONE SEEN   Urine-Other MUCOUS PRESENT      Imaging Review Ct Renal Stone Study  11/02/2015  CLINICAL DATA:  55 year old female with lower back and flank pain. EXAM: CT ABDOMEN AND PELVIS WITHOUT CONTRAST TECHNIQUE: Multidetector CT imaging of the abdomen and pelvis was performed following the standard protocol without IV contrast. COMPARISON:  CT dated 04/27/2009 FINDINGS: Evaluation of this exam is limited in the absence of intravenous contrast. The visualized lung bases are clear. There is coronary vascular calcification. No intra-abdominal free air. No free fluid. Cholecystectomy. Diffuse hepatic steatosis. A the pancreas, spleen, and adrenal glands appear unremarkable. There is a 1.9 cm obstructing stone in the right renal pelvis with mild right hydronephrosis. Punctate right renal upper pole nonobstructing calculi noted. There is mild right perinephric stranding which may represent caliceal rupture and urine extravasation. Correlation with urinalysis recommended to exclude superimposed UTI. There is a 4 mm nonobstructing left renal upper pole calculus. The visualized ureters and urinary bladder appear unremarkable. Hysterectomy. Sigmoid diverticulosis without acute inflammatory changes. There is a 6 cm infrarenal peritoneal defect in the anterior abdominal wall with herniation of multiple loops of small bowel. There is no evidence of bowel obstruction or inflammation. Normal appendix. The abdominal aorta and IVC appear grossly unremarkable on this noncontrast study. No portal venous gas identified. There is no adenopathy. Midline vertical anterior pelvic wall incisional scar. Degenerative changes of the spine. No acute fracture. IMPRESSION: A 1.9 cm obstructing right renal pelvic calculus with mild right hydronephrosis. Correlation with urinalysis recommended to exclude superimposed UTI. Small nonobstructing bilateral renal calculi noted. There is no  hydronephrosis on the left. Electronically Signed  By: Anner Crete M.D.   On: 11/02/2015 21:35   I have personally reviewed and evaluated these images and lab results as part of my medical decision-making.   EKG Interpretation None      MDM   Final diagnoses:  Flank pain  Right-sided low back pain without sciatica    Patient with a history of kidney stones. Patient with one-week history of right low back pain radiating to the right flank. Patient felt as if she passed a kidney stone yesterday. But pain has persisted. Associated with nausea but no vomiting. No history of injury. No fevers. No pain radiating into the leg no numbness or weakness to the right foot.  CT scan to rule out stone is pending. Urinalysis consistent with infection and or hematuria from a kidney stone. Leukocytosis present electrolytes without significant abnormalities other than elevation in her blood sugar she is not a known diabetic and slight elevation in her creatinine at 1.07.  Patient will need follow-up with her primary care doctor for the elevated blood sugar.       Fredia Sorrow, MD 11/02/15 2131  Fredia Sorrow, MD 11/02/15 2132  CT scan findings noted. Evidence of a very proximal right sided the renal calculus. Also concern for urinary tract infection. Patient will be treated with the 1 g of Rocephin here continued on Keflex and pain medicine. Patient has a urologist in [place] area. She states she can follow-up with them. We'll also give her referral information to urology here.  Patient will return if not improving or for any new or worse symptoms to include fever worse abdominal pain persistent vomiting. Close follow-up with urology will be important. Urine culture sent.  Fredia Sorrow, MD 11/02/15 2150

## 2015-11-02 NOTE — ED Notes (Signed)
Pt c/o pain in lower back radiating around to lower abd.  Reports it started after straining hard to have a bm today.  Pt says she passed a kidney stone yesterday and says this pain is different.

## 2015-11-02 NOTE — Discharge Instructions (Signed)
Take pain medicine as needed. Take the antibiotic definitely as prescribed for the next 7 days. Return for any new or worse symptoms over the weekend. Particularly return for nausea vomiting or some abdominal pain fever. Call urology for follow-up either in [facility] her here locally on Monday. Take Zofran as needed for nausea.

## 2015-11-06 LAB — URINE CULTURE: Culture: >=100000

## 2015-11-07 ENCOUNTER — Telehealth (HOSPITAL_BASED_OUTPATIENT_CLINIC_OR_DEPARTMENT_OTHER): Payer: Self-pay | Admitting: Emergency Medicine

## 2015-11-07 NOTE — Telephone Encounter (Signed)
Post ED Visit - Positive Culture Follow-up  Culture report reviewed by antimicrobial stewardship pharmacist:  []  Elenor Quinones, Pharm.D. []  Heide Guile, Pharm.D., BCPS []  Parks Neptune, Pharm.D. []  Alycia Rossetti, Pharm.D., BCPS []  [place], Pharm.D., BCPS, AAHIVP []  Legrand Como, Pharm.D., BCPS, AAHIVP []  Milus Glazier, Pharm.D. []  Stephens November, [place].D.  Positive urine culture Staphylococcus Treated with cephalexin, organism sensitive to the same and no further patient follow-up is required at this time.  Hazle Nordmann 11/07/2015, 9:46 AM

## 2016-10-13 DIAGNOSIS — C50919 Malignant neoplasm of unspecified site of unspecified female breast: Secondary | ICD-10-CM

## 2016-10-13 DIAGNOSIS — Z9221 Personal history of antineoplastic chemotherapy: Secondary | ICD-10-CM

## 2016-10-13 DIAGNOSIS — Z923 Personal history of irradiation: Secondary | ICD-10-CM

## 2016-10-13 HISTORY — DX: Malignant neoplasm of unspecified site of unspecified female breast: C50.919

## 2016-10-13 HISTORY — DX: Personal history of antineoplastic chemotherapy: Z92.21

## 2016-10-13 HISTORY — DX: Personal history of irradiation: Z92.3

## 2016-10-13 HISTORY — PX: BREAST LUMPECTOMY: SHX2

## 2017-03-20 ENCOUNTER — Other Ambulatory Visit (HOSPITAL_COMMUNITY): Payer: Self-pay | Admitting: Family Medicine

## 2017-03-20 DIAGNOSIS — Z1231 Encounter for screening mammogram for malignant neoplasm of breast: Secondary | ICD-10-CM

## 2017-03-23 ENCOUNTER — Ambulatory Visit (HOSPITAL_COMMUNITY)
Admission: RE | Admit: 2017-03-23 | Discharge: 2017-03-23 | Disposition: A | Discharge status: Home / Self Care | Payer: 59 | Source: Ambulatory Visit | Attending: Family Medicine | Admitting: Family Medicine

## 2017-03-23 DIAGNOSIS — Z1231 Encounter for screening mammogram for malignant neoplasm of breast: Secondary | ICD-10-CM | POA: Insufficient documentation

## 2017-03-25 ENCOUNTER — Other Ambulatory Visit (HOSPITAL_COMMUNITY): Payer: Self-pay | Admitting: Family Medicine

## 2017-03-25 DIAGNOSIS — R928 Other abnormal and inconclusive findings on diagnostic imaging of breast: Secondary | ICD-10-CM

## 2017-04-01 ENCOUNTER — Other Ambulatory Visit (HOSPITAL_COMMUNITY): Payer: Self-pay | Admitting: Family Medicine

## 2017-04-01 DIAGNOSIS — R928 Other abnormal and inconclusive findings on diagnostic imaging of breast: Secondary | ICD-10-CM

## 2017-04-07 ENCOUNTER — Ambulatory Visit (HOSPITAL_COMMUNITY)
Admission: RE | Admit: 2017-04-07 | Discharge: 2017-04-07 | Disposition: A | Discharge status: Home / Self Care | Payer: 59 | Source: Ambulatory Visit | Attending: Family Medicine | Admitting: Family Medicine

## 2017-04-07 ENCOUNTER — Other Ambulatory Visit: Payer: Self-pay | Admitting: Family Medicine

## 2017-04-07 DIAGNOSIS — R921 Mammographic calcification found on diagnostic imaging of breast: Secondary | ICD-10-CM

## 2017-04-07 DIAGNOSIS — N6310 Unspecified lump in the right breast, unspecified quadrant: Secondary | ICD-10-CM | POA: Insufficient documentation

## 2017-04-07 DIAGNOSIS — R928 Other abnormal and inconclusive findings on diagnostic imaging of breast: Secondary | ICD-10-CM

## 2017-04-07 DIAGNOSIS — N644 Mastodynia: Secondary | ICD-10-CM

## 2017-04-13 ENCOUNTER — Other Ambulatory Visit: Payer: Self-pay | Admitting: Family Medicine

## 2017-04-13 ENCOUNTER — Ambulatory Visit
Admission: RE | Admit: 2017-04-13 | Discharge: 2017-04-13 | Disposition: A | Discharge status: Home / Self Care | Payer: 59 | Source: Ambulatory Visit | Attending: Family Medicine | Admitting: Family Medicine

## 2017-04-13 ENCOUNTER — Other Ambulatory Visit: Payer: 59

## 2017-04-13 DIAGNOSIS — R921 Mammographic calcification found on diagnostic imaging of breast: Secondary | ICD-10-CM

## 2017-04-13 DIAGNOSIS — N644 Mastodynia: Secondary | ICD-10-CM

## 2017-04-16 ENCOUNTER — Other Ambulatory Visit: Payer: Self-pay | Admitting: *Deleted

## 2017-04-16 ENCOUNTER — Telehealth: Payer: Self-pay | Admitting: *Deleted

## 2017-04-16 DIAGNOSIS — C50211 Malignant neoplasm of upper-inner quadrant of right female breast: Secondary | ICD-10-CM

## 2017-04-16 DIAGNOSIS — Z170 Estrogen receptor positive status [ER+]: Principal | ICD-10-CM

## 2017-04-16 NOTE — Telephone Encounter (Signed)
Confirmed [facility] for 04/22/17 at 815am .  Instructions and contact information given.

## 2017-04-21 NOTE — Progress Notes (Signed)
Radiation Oncology         ([phone]) [phone] ________________________________  Name: Renee Harris MRN: [id number]  Date: 04/22/2017  DOB: May 26, 1961  [id number], Jenny Reichmann, MD  Alphonsa Overall, MD     REFERRING PHYSICIAN: Alphonsa Overall, MD   DIAGNOSIS: There were no encounter diagnoses.   HISTORY OF PRESENT ILLNESS: Renee Harris is a 56 y.o. female seen in the multidisciplinary breast clinic for a new diagnosis of right breast cancer. She was found to have a screening detected assymetry and subsequent diagnostic imaging revealed a mass in the right breast with calcifications. hte mass measured 2.1 x 2 x 1.3 cm at the 12-1:00 position, and the calcifications spanned 3.4 cm. Her axilla on the right was negative for adenopathy.  A biopsy of the right breast on 04/13/17 revealed fibroadenomatous change in the lower outer quadrant in the location of calcifications and was coordinate to the imaging findings, though in the upper inner quadrant, the biopsy of the mass revealed a grade 3, invasive ductal carcinoma, with DCIS and necrosis, triple positive with a Ki-67 of 60%. She comes today to discuss the options in treatment of her cancer.    PREVIOUS RADIATION THERAPY: No   PAST MEDICAL HISTORY:  Past Medical History:  Diagnosis Date  . Asthma   . Blood transfusion    as child  . Heart murmur   . Hyperlipidemia   . Hypertension   . Normal echocardiogram        PAST SURGICAL HISTORY: Past Surgical History:  Procedure Laterality Date  . ABDOMINAL HYSTERECTOMY  2004  . CHOLECYSTECTOMY  05/30/2011   Procedure: LAPAROSCOPIC CHOLECYSTECTOMY;  Surgeon: Donato Heinz;  Location: [facility];  Service: General;  Laterality: N/A;  . 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 Anesthesia problems Neg Hx   . Hypotension Neg Hx   . Malignant hyperthermia Neg Hx   . Pseudochol deficiency Neg Hx      SOCIAL  HISTORY:  reports that she has never smoked. She does not have any smokeless tobacco history on file. She reports that she drinks alcohol. She reports that she does not use drugs. She lives in [place] and is married. She takes care of young nieces.   ALLERGIES: Pyridostigmine bromide   MEDICATIONS:  Current Outpatient Prescriptions  Medication Sig Dispense Refill  . albuterol (PROVENTIL HFA;VENTOLIN HFA) 108 (90 BASE) MCG/ACT inhaler Inhale 1 puff into the lungs every 6 (six) hours as needed. For shortness of breath     . aspirin EC 81 MG tablet Take 81 mg by mouth daily.    . cephALEXin (KEFLEX) 500 MG capsule Take 1 capsule (500 mg total) by mouth 4 (four) times daily. 28 capsule 0  . Cholecalciferol (VITAMIN D) 2000 UNITS tablet Take 2,000 Units by mouth daily.      . ciprofloxacin (CIPRO) 500 MG tablet Take 1 tablet (500 mg total) by mouth every 12 (twelve) hours. 20 tablet 0  . diphenhydrAMINE (BENADRYL) 25 mg capsule Take 25 mg by mouth every 6 (six) hours as needed. For allergies     . fish oil-omega-3 fatty acids 1000 MG capsule Take 2 g by mouth daily.      Marland Kitchen HYDROcodone-acetaminophen (NORCO/VICODIN) 5-325 MG per tablet Take 2 tablets by mouth every 4 (four) hours as needed. 10 tablet 0  . HYDROcodone-acetaminophen (NORCO/VICODIN) 5-325 MG tablet  Take 1-2 tablets by mouth every 6 (six) hours as needed. 20 tablet 0  . ibuprofen (ADVIL,MOTRIN) 200 MG tablet Take 600 mg by mouth every 8 (eight) hours as needed. For pain     . losartan (COZAAR) 50 MG tablet Take 50 mg by mouth daily.      . ondansetron (ZOFRAN ODT) 4 MG disintegrating tablet Take 1 tablet (4 mg total) by mouth every 8 (eight) hours as needed for nausea. 6 tablet 0  . ondansetron (ZOFRAN ODT) 4 MG disintegrating tablet Take 1 tablet (4 mg total) by mouth every 8 (eight) hours as needed. 10 tablet 1   No current facility-administered medications for this encounter.      REVIEW OF SYSTEMS: On review of systems, the  patient reports that she is doing well overall. She denies any chest pain, shortness of breath, cough, fevers, chills, night sweats, unintended weight changes. She denies any bowel or bladder disturbances, and denies abdominal pain, nausea or vomiting. She denies any new musculoskeletal or joint aches or pains. A complete review of systems is obtained and is otherwise negative.     PHYSICAL EXAM:  Wt Readings from Last 3 Encounters:  11/02/15 281 lb 14.4 oz (127.9 kg)  03/16/14 288 lb 8 oz (130.9 kg)  05/23/11 294 lb (133.4 kg)   Temp Readings from Last 3 Encounters:  11/02/15 99.2 F (37.3 C) (Oral)  03/16/14 99.5 F (37.5 C) (Oral)  05/30/11 98 F (36.7 C) (Oral)   BP Readings from Last 3 Encounters:  11/02/15 121/73  03/16/14 121/89  05/30/11 (!) 145/68   Pulse Readings from Last 3 Encounters:  11/02/15 110  03/16/14 78  05/30/11 84     In general this is a well appearing caucasian female in no acute distress. She is alert and oriented x4 and appropriate throughout the examination. HEENT reveals that the patient is normocephalic, atraumatic. EOMs are intact. PERRLA. Skin is intact without any evidence of gross lesions. Cardiovascular exam reveals a regular rate and rhythm, no clicks rubs or murmurs are auscultated. Chest is clear to auscultation bilaterally. Lymphatic assessment is performed and does not reveal any adenopathy in the cervical, supraclavicular, axillary, or inguinal chains. Bilateral breast exam is performed and reveals ecchymosis of the right breast consistent with her biopsy site. No palpable mass is appreciated but her breasts are dense. The left breast does not have any palpable abnormalities. No nipple bleeding or discharge is noted.  Abdomen has active bowel sounds in all quadrants and is intact. The abdomen is soft, non tender, non distended. Lower extremities are negative for pretibial pitting edema, deep calf tenderness, cyanosis or clubbing.   ECOG =  0  0 - Asymptomatic (Fully active, able to carry on all predisease activities without restriction)  1 - Symptomatic but completely ambulatory (Restricted in physically strenuous activity but ambulatory and able to carry out work of a light or sedentary nature. For example, light housework, office work)  2 - Symptomatic, <50% in bed during the day (Ambulatory and capable of all self care but unable to carry out any work activities. Up and about more than 50% of waking hours)  3 - Symptomatic, >50% in bed, but not bedbound (Capable of only limited self-care, confined to bed or chair 50% or more of waking hours)  4 - Bedbound (Completely disabled. Cannot carry on any self-care. Totally confined to bed or chair)  5 - Death   Eustace Pen MM, Creech RH, Tormey DC, et al. [phone]). "Toxicity  and response criteria of the [facility]". [place] Oncol. 5 (6): 649-55    LABORATORY DATA:  Lab Results  Component Value Date   WBC 16.1 (H) 11/02/2015   HGB 12.9 11/02/2015   HCT 39.2 11/02/2015   MCV 83.8 11/02/2015   PLT 253 11/02/2015   Lab Results  Component Value Date   NA 137 11/02/2015   K 4.3 11/02/2015   CL 101 11/02/2015   CO2 26 11/02/2015   No results found for: ALT, AST, GGT, ALKPHOS, BILITOT    RADIOGRAPHY: Mm Digital Screening Bilateral  Result Date: 03/24/2017 CLINICAL DATA:  Screening. EXAM: DIGITAL SCREENING BILATERAL MAMMOGRAM WITH CAD COMPARISON:  None. ACR Breast Density Category b: There are scattered areas of fibroglandular density. FINDINGS: In the right breast, a mass and calcifications warrant further evaluation. In the left breast, no findings suspicious for malignancy. Images were processed with CAD. IMPRESSION: Further evaluation is suggested for a mass and calcifications in the right breast. RECOMMENDATION: Diagnostic mammogram and possibly ultrasound of the right breast. (Code:[id number]-R-[id number]) The patient will be contacted regarding the findings, and  additional imaging will be scheduled. BI-RADS CATEGORY  0: Incomplete. Need additional imaging evaluation and/or prior mammograms for comparison. Electronically Signed   By: Ammie Ferrier M.D.   On: 03/24/2017 11:34   US Breast Ltd Uni Right Inc Axilla  Result Date: 04/07/2017 CLINICAL DATA:  Screening recall for right breast mass and calcifications. In addition, the patient states she now feels a palpable abnormality in her upper right breast since her recent screening mammogram. EXAM: 2D DIGITAL DIAGNOSTIC UNILATERAL RIGHT MAMMOGRAM WITH CAD AND ADJUNCT TOMO RIGHT BREAST ULTRASOUND COMPARISON:  Screening mammogram dated 03/23/2017. ACR Breast Density Category b: There are scattered areas of fibroglandular density. FINDINGS: Cc and MLO tomograms of the right breast as well as spot compression magnification views of the right breast were performed. There is an irregular spiculated mass with associated microcalcifications in the superior right breast measuring approximately 2.1 cm. Spot compression magnification views of the retroareolar right breast demonstrate loosely grouped predominantly coarse calcifications, however some of which are amorphous and suspicious, spanning a distance of approximately 3.4 cm. Mammographic images were processed with CAD. Physical examination of the upper right breast reveals a firm fixed nodule at the approximate 12 o'clock position. Targeted ultrasound of the right breast was performed demonstrating an irregular hypoechoic mass at 12 o'clock 9 cm from nipple measuring 2 x 1.3 x 2.1 cm. This corresponds well with the mass seen at mammography. No definite lymphadenopathy seen in the right axilla. IMPRESSION: 1. Highly suspicious right breast mass with associated calcifications measuring 2.1 cm. 2. Additional indeterminate calcifications in the retroareolar right breast. RECOMMENDATION: 1. Ultrasound-guided biopsy of the mass in the right breast at the 12 o'clock position is  recommended. 3. Stereotactic guided biopsy of the calcifications in the retroareolar right breast is recommended. Biopsies are being scheduled for the patient at the [facility]. I have discussed the findings and recommendations with the patient. Results were also provided in writing at the conclusion of the visit. If applicable, a reminder letter will be sent to the patient regarding the next appointment. BI-RADS CATEGORY  5: Highly suggestive of malignancy. Electronically Signed   By: Everlean Alstrom M.D.   On: 04/07/2017 13:06   Mm Diag Breast Tomo Uni Right  Result Date: 04/07/2017 CLINICAL DATA:  Screening recall for right breast mass and calcifications. In addition, the patient states she now feels a  palpable abnormality in her upper right breast since her recent screening mammogram. EXAM: 2D DIGITAL DIAGNOSTIC UNILATERAL RIGHT MAMMOGRAM WITH CAD AND ADJUNCT TOMO RIGHT BREAST ULTRASOUND COMPARISON:  Screening mammogram dated 03/23/2017. ACR Breast Density Category b: There are scattered areas of fibroglandular density. FINDINGS: Cc and MLO tomograms of the right breast as well as spot compression magnification views of the right breast were performed. There is an irregular spiculated mass with associated microcalcifications in the superior right breast measuring approximately 2.1 cm. Spot compression magnification views of the retroareolar right breast demonstrate loosely grouped predominantly coarse calcifications, however some of which are amorphous and suspicious, spanning a distance of approximately 3.4 cm. Mammographic images were processed with CAD. Physical examination of the upper right breast reveals a firm fixed nodule at the approximate 12 o'clock position. Targeted ultrasound of the right breast was performed demonstrating an irregular hypoechoic mass at 12 o'clock 9 cm from nipple measuring 2 x 1.3 x 2.1 cm. This corresponds well with the mass seen at mammography. No definite  lymphadenopathy seen in the right axilla. IMPRESSION: 1. Highly suspicious right breast mass with associated calcifications measuring 2.1 cm. 2. Additional indeterminate calcifications in the retroareolar right breast. RECOMMENDATION: 1. Ultrasound-guided biopsy of the mass in the right breast at the 12 o'clock position is recommended. 3. Stereotactic guided biopsy of the calcifications in the retroareolar right breast is recommended. Biopsies are being scheduled for the patient at the [facility]. I have discussed the findings and recommendations with the patient. Results were also provided in writing at the conclusion of the visit. If applicable, a reminder letter will be sent to the patient regarding the next appointment. BI-RADS CATEGORY  5: Highly suggestive of malignancy. Electronically Signed   By: Everlean Alstrom M.D.   On: 04/07/2017 13:06   Mm Clip Placement Right  Result Date: 04/13/2017 CLINICAL DATA:  Status post biopsy of calcifications and a mass in the right breast. EXAM: DIAGNOSTIC RIGHT MAMMOGRAM POST STEREOTACTIC AND ULTRASOUND BIOPSIES OF THE RIGHT BREAST COMPARISON:  Previous exam(s). FINDINGS: Mammographic images were obtained following stereotactic guided biopsy of calcifications in the upper-outer retroareolar region of the right breast. A coil shaped clip is identified in this portion of the breast following biopsy. Following ultrasound-guided core biopsy of a mass in the upper inner quadrant of the right breast, a ribbon shaped clip is identified within this mass. Clips are 6.8 cm apart. IMPRESSION: Tissue marker clips are in the expected locations after biopsy. Final Assessment: Post Procedure Mammograms for Marker Placement Electronically Signed   By: Nolon Nations M.D.   On: 04/13/2017 10:44   Mm Rt Breast Bx W Loc Dev 1st Lesion Image Bx Spec Stereo Guide  Addendum Date: 04/14/2017   ADDENDUM REPORT: 04/14/2017 13:51 ADDENDUM: Pathology revealed a fibroadenoma  with calcifications in the upper outer quadrant of the RIGHT breast and grade III invasive ductal carcinoma and ductal carcinoma in situ with necrosis in the RIGHT breast at 1:00. This was found to be concordant by Dr. Nolon Nations. Pathology results were discussed with the patient by telephone. The patient reported doing well after the biopsies. Post biopsy instructions and care were reviewed and questions were answered. The patient was encouraged to call [facility] for any additional concerns. The patient was referred to the [facility] at the [facility] on April 22, 2017. Pathology results reported by Susa Raring RN, BSN on 04/14/2017. Electronically Signed  By: Nolon Nations M.D.   On: 04/14/2017 13:51   Result Date: 04/14/2017 CLINICAL DATA:  Patient presents for stereotactic guided core biopsy of calcifications in the upper-outer quadrant of the right breast. EXAM: RIGHT BREAST STEREOTACTIC CORE NEEDLE BIOPSY COMPARISON:  Previous exams. FINDINGS: The patient and I discussed the procedure of stereotactic-guided biopsy including benefits and alternatives. We discussed the high likelihood of a successful procedure. We discussed the risks of the procedure including infection, bleeding, tissue injury, clip migration, and inadequate sampling. Informed written consent was given. The usual time out protocol was performed immediately prior to the procedure. Using sterile technique and 1% Lidocaine as local anesthetic, under stereotactic guidance, a 9 gauge vacuum assisted device was used to perform core needle biopsy of calcifications in the upper outer retroareolar region of the right breast using a superior approach. Specimen radiograph was performed showing calcifications to be present. Specimens with calcifications are identified for pathology. Lesion quadrant: Upper-outer quadrant right breast At the conclusion of the procedure, a  coil shaped tissue marker clip was deployed into the biopsy cavity. Follow-up 2-view mammogram was performed and dictated separately. IMPRESSION: Stereotactic-guided biopsy of right breast calcifications. No apparent complications. Ultrasound-guided core biopsy of mass in the 1 o'clock location the right breast is performed on the same day and is dictated separately. Electronically Signed: By: Nolon Nations M.D. On: 04/13/2017 10:32   [place] Rt Breast Bx W Loc Dev 1st Lesion Img Bx Spec US Guide  Addendum Date: 04/14/2017   ADDENDUM REPORT: 04/14/2017 13:51 ADDENDUM: Pathology revealed a fibroadenoma with calcifications in the upper outer quadrant of the RIGHT breast and grade III invasive ductal carcinoma and ductal carcinoma in situ with necrosis in the RIGHT breast at 1:00. This was found to be concordant by Dr. Nolon Nations. Pathology results were discussed with the patient by telephone. The patient reported doing well after the biopsies. Post biopsy instructions and care were reviewed and questions were answered. The patient was encouraged to call [facility] for any additional concerns. The patient was referred to the [facility] at the [facility] on April 22, 2017. Pathology results reported by Susa Raring RN, BSN on 04/14/2017. Electronically Signed   By: Nolon Nations M.D.   On: 04/14/2017 13:51   Result Date: 04/14/2017 CLINICAL DATA:  Patient presents for ultrasound-guided core biopsy of palpable mass in the right breast. EXAM: ULTRASOUND GUIDED RIGHT BREAST CORE NEEDLE BIOPSY COMPARISON:  Previous exam(s). FINDINGS: I met with the patient and we discussed the procedure of ultrasound-guided biopsy, including benefits and alternatives. We discussed the high likelihood of a successful procedure. We discussed the risks of the procedure, including infection, bleeding, tissue injury, clip migration, and inadequate sampling.  Informed written consent was given. The usual time-out protocol was performed immediately prior to the procedure. Lesion quadrant: Upper inner quadrant right breast Using sterile technique and 1% Lidocaine as local anesthetic, under direct ultrasound visualization, a 12 gauge spring-loaded device was used to perform biopsy of mass in the 1 o'clock location of the right breast using a medial approach. At the conclusion of the procedure a ribbon shaped tissue marker clip was deployed into the biopsy cavity. Follow up 2 view mammogram was performed and dictated separately. IMPRESSION: Ultrasound guided biopsy of right breast mass. No apparent complications. Stereotactic guided core biopsy of calcifications in the right breast is performed on the same day and is dictated separately. Electronically Signed: By: Benjamine Mola  Owens Shark M.D. On: 04/13/2017 10:34       IMPRESSION/PLAN: 1. Stage IB, cT2,N0, Mx , grade 3, triple positive invasive ductal carcinoma and DCIS with necrosis of the right breast. Dr. Lisbeth Renshaw discusses the pathology findings and reviews the nature of invasive, triple positive disease. The consensus from the breast conference includes proceeding with an MRI of the breast to better delineate the location of her disease and to ensure that there is no additional need for further biopsy.  She would then move forward with PAC placement, breast conservation with lumpectomy with sentinel mapping. The patient's course would then be followed by adjuvant chemotherapy and ultimately, external radiotherapy to the breast followed by antiestrogen therapy. We discussed the risks, benefits, short, and long term effects of radiotherapy, and the patient is interested in proceeding. Dr. Lisbeth Renshaw discusses the delivery and logistics of radiotherapy and would anticipate a course of 6 1/2 weeks of treatment. We will see her back about 2-3 weeks after completing chemotherapy to move forward with the simulation and planning  process.   The above documentation reflects my direct findings during this shared patient visit. Please see the separate note by Dr. Lisbeth Renshaw on this date for the remainder of the patient's plan of care.    Carola Rhine, PAC

## 2017-04-22 ENCOUNTER — Encounter: Payer: Self-pay | Admitting: Hematology and Oncology

## 2017-04-22 ENCOUNTER — Ambulatory Visit (HOSPITAL_BASED_OUTPATIENT_CLINIC_OR_DEPARTMENT_OTHER): Payer: 59 | Admitting: Hematology and Oncology

## 2017-04-22 ENCOUNTER — Encounter: Payer: Self-pay | Admitting: Physical Therapy

## 2017-04-22 ENCOUNTER — Encounter: Payer: Self-pay | Admitting: *Deleted

## 2017-04-22 ENCOUNTER — Telehealth: Payer: Self-pay | Admitting: Hematology and Oncology

## 2017-04-22 ENCOUNTER — Ambulatory Visit
Admission: RE | Admit: 2017-04-22 | Discharge: 2017-04-22 | Disposition: A | Discharge status: Home / Self Care | Payer: 59 | Source: Ambulatory Visit | Attending: Radiation Oncology | Admitting: Radiation Oncology

## 2017-04-22 ENCOUNTER — Ambulatory Visit: Payer: 59 | Attending: Surgery | Admitting: Physical Therapy

## 2017-04-22 ENCOUNTER — Other Ambulatory Visit (HOSPITAL_BASED_OUTPATIENT_CLINIC_OR_DEPARTMENT_OTHER): Payer: 59

## 2017-04-22 ENCOUNTER — Other Ambulatory Visit: Payer: Self-pay | Admitting: *Deleted

## 2017-04-22 DIAGNOSIS — R293 Abnormal posture: Secondary | ICD-10-CM | POA: Diagnosis not present

## 2017-04-22 DIAGNOSIS — C50211 Malignant neoplasm of upper-inner quadrant of right female breast: Secondary | ICD-10-CM | POA: Insufficient documentation

## 2017-04-22 DIAGNOSIS — Z170 Estrogen receptor positive status [ER+]: Principal | ICD-10-CM

## 2017-04-22 DIAGNOSIS — Z801 Family history of malignant neoplasm of trachea, bronchus and lung: Secondary | ICD-10-CM | POA: Diagnosis not present

## 2017-04-22 LAB — CBC WITH DIFFERENTIAL/PLATELET
BASO%: 0.3 % (ref 0.0–2.0)
BASOS ABS: 0 10*3/uL (ref 0.0–0.1)
EOS ABS: 0.3 10*3/uL (ref 0.0–0.5)
EOS%: 3.7 % (ref 0.0–7.0)
HCT: 41.9 % (ref 34.8–46.6)
HEMOGLOBIN: 13.8 g/dL (ref 11.6–15.9)
LYMPH%: 28.7 % (ref 14.0–49.7)
MCH: 28.2 pg (ref 25.1–34.0)
MCHC: 32.9 g/dL (ref 31.5–36.0)
MCV: 85.7 fL (ref 79.5–101.0)
MONO#: 0.5 10*3/uL (ref 0.1–0.9)
MONO%: 7.3 % (ref 0.0–14.0)
NEUT#: 4.3 10*3/uL (ref 1.5–6.5)
NEUT%: 60 % (ref 38.4–76.8)
Platelets: 184 10*3/uL (ref 145–400)
RBC: 4.89 10*6/uL (ref 3.70–5.45)
RDW: 13.9 % (ref 11.2–14.5)
WBC: 7.2 10*3/uL (ref 3.9–10.3)
lymph#: 2.1 10*3/uL (ref 0.9–3.3)

## 2017-04-22 LAB — COMPREHENSIVE METABOLIC PANEL
ALBUMIN: 4 g/dL (ref 3.5–5.0)
ALK PHOS: 68 U/L (ref 40–150)
ALT: 35 U/L (ref 0–55)
AST: 29 U/L (ref 5–34)
Anion Gap: 12 mEq/L — ABNORMAL HIGH (ref 3–11)
BUN: 15.3 mg/dL (ref 7.0–26.0)
CALCIUM: 10.7 mg/dL — AB (ref 8.4–10.4)
CO2: 24 mEq/L (ref 22–29)
Chloride: 106 mEq/L (ref 98–109)
Creatinine: 1 mg/dL (ref 0.6–1.1)
EGFR: 68 mL/min/{1.73_m2} — ABNORMAL LOW (ref >90)
Glucose: 176 mg/dl — ABNORMAL HIGH (ref 70–140)
POTASSIUM: 4.1 meq/L (ref 3.5–5.1)
SODIUM: 142 meq/L (ref 136–145)
Total Bilirubin: 0.51 mg/dL (ref 0.20–1.20)
Total Protein: 7.6 g/dL (ref 6.4–8.3)

## 2017-04-22 NOTE — Progress Notes (Signed)
[place] NOTE  Patient Care Team: Sharilyn Sites, MD as PCP - General (Family Medicine) Alphonsa Overall, MD as Consulting Physician (General Surgery) Nicholas Lose, MD as Consulting Physician (Hematology and Oncology) Kyung Rudd, MD as Consulting Physician (Radiation Oncology)  CHIEF COMPLAINTS/PURPOSE OF CONSULTATION:  Newly diagnosed breast cancer  HISTORY OF PRESENTING ILLNESS:  Renee Harris 56 y.o. female is here because of recent diagnosis of right breast cancer. Patient had a screening mammogram the detected a mass with calcifications. The biopsy of the 2 lesions were performed. The calcification biopsy came back as fibroadenoma. The mass and calcifications biopsy came back as invasive ductal carcinoma grade 3 that is ER/PR and HER-2 positive and Ki-67 of 60%. She was presented this morning at the multidisciplinary tumor board and she is here today to discuss the treatment plan.  I reviewed her records extensively and collaborated the history with the patient.  SUMMARY OF ONCOLOGIC HISTORY:   Malignant neoplasm of upper-inner quadrant of right breast in female, estrogen receptor positive ([place])   04/13/2017 Initial Diagnosis    Screening detected right breast mass with calcifications 2.1 cm in size and 3.4 cm in size. Calcifications were fibroadenoma. Right breast biopsy upper inner quadrant 1:00 mass: IDC with DCIS with necrosis, grade 3, ER 100%, PR 70%, Ki-67 60%, HER-2 positive ratio 2.34, T2 N0 stage II a      MEDICAL HISTORY:  Past Medical History:  Diagnosis Date  . Asthma   . Blood transfusion    as child  . Diabetes ([place])   . Heart murmur   . Hyperlipidemia   . Hypertension   . Normal echocardiogram     SURGICAL HISTORY: Past Surgical History:  Procedure Laterality Date  . ABDOMINAL HYSTERECTOMY  2004  . CHOLECYSTECTOMY  05/30/2011   Procedure: LAPAROSCOPIC CHOLECYSTECTOMY;  Surgeon: Donato Heinz;  Location: [facility];  Service:  General;  Laterality: N/A;  . EYE SURGERY     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, on both eyes    SOCIAL HISTORY: Social History   Social History  . Marital status: Married    Spouse name: N/A  . Number of children: N/A  . Years of education: N/A   Occupational History  . Not on file.   Social History Main Topics  . Smoking status: Never Smoker  . Smokeless tobacco: Never Used  . Alcohol use Yes     Comment: 2 x year  . Drug use: No  . Sexual activity: Yes    Birth control/ protection: Surgical   Other Topics Concern  . Not on file   Social History Narrative  . No narrative on file    FAMILY HISTORY: Family History  Problem Relation Age of Onset  . Lung cancer Maternal Grandmother   . Anesthesia problems Neg Hx   . Hypotension Neg Hx   . Malignant hyperthermia Neg Hx   . Pseudochol deficiency Neg Hx     ALLERGIES:  is allergic to pyridostigmine bromide and statins.  MEDICATIONS:  Current Outpatient Prescriptions  Medication Sig Dispense Refill  . albuterol (PROVENTIL HFA;VENTOLIN HFA) 108 (90 BASE) MCG/ACT inhaler Inhale 1 puff into the lungs every 6 (six) hours as needed. For shortness of breath     . allopurinol (ZYLOPRIM) 300 MG tablet Take 300 mg by mouth daily.    Marland Kitchen aspirin EC 81 MG tablet Take 81 mg by mouth daily.    . Black Cohosh 40 MG CAPS Take by mouth.    Marland Kitchen  diphenhydrAMINE (BENADRYL) 25 mg capsule Take 25 mg by mouth every 6 (six) hours as needed. For allergies     . escitalopram (LEXAPRO) 20 MG tablet Take 20 mg by mouth daily.    . fenofibrate 160 MG tablet Take 160 mg by mouth daily.    . hydrochlorothiazide (HYDRODIURIL) 12.5 MG tablet Take 12.5 mg by mouth daily.    Marland Kitchen losartan (COZAAR) 50 MG tablet Take 50 mg by mouth daily.      . metFORMIN (GLUCOPHAGE) 500 MG tablet Take 500 mg by mouth 2 (two) times daily with a meal.    . Multiple Vitamin (MULTIVITAMIN) tablet Take 1 tablet by mouth daily.    . cephALEXin (KEFLEX) 500 MG capsule Take 1  capsule (500 mg total) by mouth 4 (four) times daily. (Patient not taking: Reported on 04/22/2017) 28 capsule 0  . ondansetron (ZOFRAN ODT) 4 MG disintegrating tablet Take 1 tablet (4 mg total) by mouth every 8 (eight) hours as needed for nausea. 6 tablet 0  . ondansetron (ZOFRAN ODT) 4 MG disintegrating tablet Take 1 tablet (4 mg total) by mouth every 8 (eight) hours as needed. 10 tablet 1   No current facility-administered medications for this visit.     REVIEW OF SYSTEMS:   Constitutional: Denies fevers, chills or abnormal night sweats Eyes: Denies blurriness of vision, double vision or watery eyes Ears, nose, mouth, throat, and face: Denies mucositis or sore throat Respiratory: Denies cough, dyspnea or wheezes Cardiovascular: Denies palpitation, chest discomfort or lower extremity swelling Gastrointestinal:  Denies nausea, heartburn or change in bowel habits Skin: Denies abnormal skin rashes Lymphatics: Denies new lymphadenopathy or easy bruising Neurological:Denies numbness, tingling or new weaknesses Behavioral/Psych: Mood is stable, no new changes  Breast:  Denies any palpable lumps or discharge All other systems were reviewed with the patient and are negative.  PHYSICAL EXAMINATION: ECOG PERFORMANCE STATUS: 1 - Symptomatic but completely ambulatory  Vitals:   04/22/17 0850  BP: (!) 157/66  Pulse: 82  Resp: 18  Temp: 98.3 F (36.8 C)   Filed Weights   04/22/17 0850  Weight: 269 lb 14.4 oz (122.4 kg)    GENERAL:alert, no distress and comfortable SKIN: skin color, texture, turgor are normal, no rashes or significant lesions EYES: normal, conjunctiva are pink and non-injected, sclera clear OROPHARYNX:no exudate, no erythema and lips, buccal mucosa, and tongue normal  NECK: supple, thyroid normal size, non-tender, without nodularity LYMPH:  no palpable lymphadenopathy in the cervical, axillary or inguinal LUNGS: clear to auscultation and percussion with normal breathing  effort HEART: regular rate & rhythm and no murmurs and no lower extremity edema ABDOMEN:abdomen soft, non-tender and normal bowel sounds Musculoskeletal:no cyanosis of digits and no clubbing  PSYCH: alert & oriented x 3 with fluent speech NEURO: no focal motor/sensory deficits BREAST: No palpable nodules in breast. No palpable axillary or supraclavicular lymphadenopathy (exam performed in the presence of a chaperone)   LABORATORY DATA:  I have reviewed the data as listed Lab Results  Component Value Date   WBC 7.2 04/22/2017   HGB 13.8 04/22/2017   HCT 41.9 04/22/2017   MCV 85.7 04/22/2017   PLT 184 04/22/2017   Lab Results  Component Value Date   NA 142 04/22/2017   K 4.1 04/22/2017   CL 101 11/02/2015   CO2 24 04/22/2017    RADIOGRAPHIC STUDIES: I have personally reviewed the radiological reports and agreed with the findings in the report.  ASSESSMENT AND PLAN:  Malignant neoplasm of upper-inner  quadrant of right breast in female, estrogen receptor positive ([place]) 04/13/2016: Screening detected right breast mass with calcifications 2.1 cm in size and 3.4 cm in size. Calcifications were fibroadenoma. Right breast biopsy upper inner quadrant 1:00 mass: IDC with DCIS with necrosis, grade 3, ER 100%, PR 70%, Ki-67 60%, HER-2 positive ratio 2.34, T2 N0 stage II a  Pathology and radiology counseling: Discussed with the patient, the details of pathology including the type of breast cancer,the clinical staging, the significance of ER, PR and HER-2/neu receptors and the implications for treatment. After reviewing the pathology in detail, we proceeded to discuss the different treatment options between surgery, radiation, chemotherapy, antiestrogen therapies.  Recommendation: 1. Breast MRI to further evaluate the 2 different areas 2. breast conserving surgery with sentinel lymph node biopsy 3. Followed by adjuvant chemotherapy with TCH Perjeta 6 cycles followed Herceptin Perjeta for 1  year 4. Followed by adjuvant radiation 5. Followed by adjuvant antiestrogen therapy +/- Neratinib  Chemotherapy Counseling: I discussed the risks and benefits of chemotherapy including the risks of nausea/ vomiting, risk of infection from low WBC count, fatigue due to chemo or anemia, bruising or bleeding due to low platelets, mouth sores, loss/ change in taste and decreased appetite. Liver and kidney function will be monitored through out chemotherapy as abnormalities in liver and kidney function may be a side effect of treatment. Cardiac dysfunction due to Herceptin and Perjeta were discussed in detail. Risk of permanent bone marrow dysfunction and leukemia due to chemo were also discussed.  We will await the result of the breast MRI to determine if she needs neoadjuvant chemotherapy. Since the patient lives in [place], we can arrange for her to receive her treatment closer to home in [place].     All questions were answered. The patient knows to call the clinic with any problems, questions or concerns.    Rulon Eisenmenger, MD 04/22/17

## 2017-04-22 NOTE — Assessment & Plan Note (Signed)
04/13/2016: Screening detected right breast mass with calcifications 2.1 cm in size and 3.4 cm in size. Calcifications were fibroadenoma. Right breast biopsy upper inner quadrant 1:00 mass: IDC with DCIS with necrosis, grade 3, ER 100%, PR 70%, Ki-67 60%, HER-2 positive ratio 2.34, T2 N0 stage II a  Pathology and radiology counseling: Discussed with the patient, the details of pathology including the type of breast cancer,the clinical staging, the significance of ER, PR and HER-2/neu receptors and the implications for treatment. After reviewing the pathology in detail, we proceeded to discuss the different treatment options between surgery, radiation, chemotherapy, antiestrogen therapies.  Recommendation: 1. Breast MRI to further evaluate the 2 different areas 2. breast conserving surgery with sentinel lymph node biopsy 3. Followed by adjuvant chemotherapy with TCH Perjeta 6 cycles followed Herceptin Perjeta for 1 year 4. Followed by adjuvant radiation 5. Followed by adjuvant antiestrogen therapy +/- Neratinib  Chemotherapy Counseling: I discussed the risks and benefits of chemotherapy including the risks of nausea/ vomiting, risk of infection from low WBC count, fatigue due to chemo or anemia, bruising or bleeding due to low platelets, mouth sores, loss/ change in taste and decreased appetite. Liver and kidney function will be monitored through out chemotherapy as abnormalities in liver and kidney function may be a side effect of treatment. Cardiac dysfunction due to Herceptin and Perjeta were discussed in detail. Risk of permanent bone marrow dysfunction and leukemia due to chemo were also discussed.  We will await the result of the breast MRI to determine if she needs neoadjuvant chemotherapy. Since the patient lives in [place], we can arrange for her to receive her treatment closer to home in [place].

## 2017-04-22 NOTE — Therapy (Signed)
[place], [place], [address] Phone: [phone]   Fax:  [phone]  Physical Therapy Evaluation  Patient Details  Name: Renee Harris MRN: [id number] Date of Birth: 1961/05/09 Referring Provider: Dr. Alphonsa Overall  Encounter Date: 04/22/2017      PT End of Session - 04/22/17 1146    Visit Number 1   Number of Visits 1   PT Start Time 1005   PT Stop Time 2505  Also saw pt from 1123-1140 for a total of 28 minutes   PT Time Calculation (min) 11 min   Activity Tolerance Patient tolerated treatment well   Behavior During Therapy [facility] for tasks assessed/performed      Past Medical History:  Diagnosis Date  . Asthma   . Blood transfusion    as child  . Diabetes ([place])   . Heart murmur   . Hyperlipidemia   . Hypertension   . Normal echocardiogram     Past Surgical History:  Procedure Laterality Date  . ABDOMINAL HYSTERECTOMY  2004  . CHOLECYSTECTOMY  05/30/2011   Procedure: LAPAROSCOPIC CHOLECYSTECTOMY;  Surgeon: Donato Heinz;  Location: [facility];  Service: General;  Laterality: N/A;  . EYE SURGERY     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, on both eyes    There were no vitals filed for this visit.       Subjective Assessment - 04/22/17 1113    Subjective Patient reports she is here today to be seen by her medical team for her newly diagnosed right breast cancer.   Patient is accompained by: Family member   Pertinent History Patient was diagnosed on 03/23/17 with right Triple positive grade 3 invasive ductal carcinoma breast cancer. It is located in the upper inner quadrant and measures 2.1 cm.   Patient Stated Goals Reduce lymphedema risk and learn post op shoulder ROM HEP   Currently in Pain? No/denies            [facility] PT Assessment - 04/22/17 0001      Assessment   Medical Diagnosis Right breast cancer   Referring Provider Dr. Alphonsa Overall   Onset Date/Surgical Date 03/23/17   Hand Dominance Right   Prior Therapy none     Precautions   Precautions Other (comment)   Precaution Comments active cancer     Restrictions   Weight Bearing Restrictions No     Balance Screen   Has the patient fallen in the past 6 months No   Has the patient had a decrease in activity level because of a fear of falling?  No   Is the patient reluctant to leave their home because of a fear of falling?  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   Leisure She does not exercise     Cognition   Overall Cognitive Status Within Functional Limits for tasks assessed     Posture/Postural Control   Posture/Postural Control Postural limitations   Postural Limitations Rounded Shoulders;Forward head     ROM / Strength   AROM / PROM / Strength AROM;Strength     AROM   AROM Assessment Site Shoulder;Cervical   Right/Left Shoulder Right;Left   Right Shoulder Extension  35 Degrees   Right Shoulder Flexion 140 Degrees   Right Shoulder ABduction 146 Degrees   Right Shoulder Internal Rotation 60 Degrees   Right Shoulder External Rotation 70 Degrees   Left Shoulder Extension 48 Degrees   Left Shoulder Flexion 146 Degrees   Left Shoulder ABduction 150 Degrees   Left Shoulder Internal Rotation 74 Degrees   Left Shoulder External Rotation 78 Degrees   Cervical Flexion WNL   Cervical Extension WNL   Cervical - Right Side Bend 25% limited   Cervical - Left Side Bend WNL   Cervical - Right Rotation WNL   Cervical - Left Rotation WNL     Strength   Overall Strength Within functional limits for tasks performed           LYMPHEDEMA/ONCOLOGY QUESTIONNAIRE - 04/22/17 1144      Type   Cancer Type Right breast cancer     Lymphedema  Assessments   Lymphedema Assessments Upper extremities     Right Upper Extremity Lymphedema   10 cm Proximal to Olecranon Process 43.5 cm   Olecranon Process 28.4 cm   10 cm Proximal to Ulnar Styloid Process 26.7 cm   Just Proximal to Ulnar Styloid Process 19.2 cm   Across Hand at [employer] 20.5 cm   At [place] of 2nd Digit 7 cm     Left Upper Extremity Lymphedema   10 cm Proximal to Olecranon Process 40.2 cm   Olecranon Process 28.7 cm   10 cm Proximal to Ulnar Styloid Process 26.3 cm   Just Proximal to Ulnar Styloid Process 19.8 cm   Across Hand at [employer] 20.4 cm   At [place] of 2nd Digit 6.9 cm         Objective measurements completed on examination: See above findings.       Patient was instructed today in a home exercise program today for post op shoulder range of motion. These included active assist shoulder flexion in sitting, scapular retraction, wall walking with shoulder abduction, and hands behind head external rotation.  She was encouraged to do these twice a day, holding 3 seconds and repeating 5 times when permitted by her physician.         PT Education - 04/22/17 1145    Education provided Yes   Education Details Lymphedema risk reduction and post op shoulder ROM HEP   Person(s) Educated Patient;Spouse   Methods Explanation;Demonstration;Handout   Comprehension Returned demonstration;Verbalized understanding              Breast Clinic Goals - 04/22/17 1154      Patient will be able to verbalize understanding of pertinent lymphedema risk reduction practices relevant to her diagnosis specifically related to skin care.   Time 1   Period Days   Status Achieved     Patient will be able to return demonstrate and/or verbalize understanding of the post-op home exercise program related to regaining shoulder range of motion.   Time 1   Period Days   Status Achieved     Patient will be able to verbalize understanding of the importance of  attending the postoperative After Breast Cancer Class for further lymphedema risk reduction education and therapeutic exercise.   Time 1   Period Days   Status Achieved               Plan - 04/22/17 1147    Clinical Impression Statement Patient was diagnosed on 03/23/17 with right Triple positive grade 3 invasive  ductal carcinoma breast cancer. It is located in the upper inner quadrant and measures 2.1 cm. Her multidisciplinary medical team met prior to her assessments to determine a recommended treatment plan. She is planning to have an MRI to determine extent of disease in the breast. If that comes back as expected, she will undergo a right lumpectomy with a sentinel node biopsy followed by chemotherapy, radiation, and anti-estrogen therapy. She may benefit from post op PT to regain shoulder ROM and reduce lymphedema risk.   History and Personal Factors relevant to plan of care: Obesity; unknown extent of disease   Clinical Presentation Evolving   Clinical Presentation due to: Condition is evolving until MRI results are obtained as those results may change the surgical plan resulting in a mastectomy.   Clinical Decision Making Moderate   Rehab Potential Excellent   Clinical Impairments Affecting Rehab Potential Obesity; unknown extent of disease   PT Frequency One time visit   PT Treatment/Interventions Patient/family education;Therapeutic exercise   PT Next Visit Plan Will f/u after surgery to determine PT needs   PT Home Exercise Plan Post op shoulder ROM HEP   Consulted and Agree with Plan of Care Patient;Family member/caregiver   Family Member Consulted Husband and sister      Patient will benefit from skilled therapeutic intervention in order to improve the following deficits and impairments:  Postural dysfunction, Decreased knowledge of precautions, Pain, Impaired UE functional use, Decreased range of motion  Visit Diagnosis: Abnormal posture - Plan: PT plan of care  cert/re-cert  Carcinoma of upper-inner quadrant of right breast in female, estrogen receptor positive ([place]) - Plan: PT plan of care cert/re-cert   Patient will follow up at outpatient cancer rehab if needed following surgery.  If the patient requires physical therapy at that time, a specific plan will be dictated and sent to the referring physician for approval. The patient was educated today on appropriate basic range of motion exercises to begin post operatively and the importance of attending the After Breast Cancer class following surgery.  Patient was educated today on lymphedema risk reduction practices as it pertains to recommendations that will benefit the patient immediately following surgery.  She verbalized good understanding.  No additional physical therapy is indicated at this time.      Problem List Patient Active Problem List   Diagnosis Date Noted  . Malignant neoplasm of upper-inner quadrant of right breast in female, estrogen receptor positive ([place]) 04/16/2017    Annia Friendly, PT 04/22/17 11:56 AM  [place], [place], [address] Phone: [phone]   Fax:  [phone]  Name: Renee Harris MRN: [id number] Date of Birth: [birth year]

## 2017-04-22 NOTE — Telephone Encounter (Signed)
LVM TO INFORM PTOF CHEMO CLASS 7/18 AT 10 AM PER [place] MSG

## 2017-04-22 NOTE — Progress Notes (Signed)
Clinical Social Work [place] Psychosocial Distress Screening [place]  Patient completed distress screening protocol and scored a 3 on the Psychosocial Distress Thermometer which indicates mild distress. Clinical Social Worker met with patient and patients family in [facility] to assess for distress and other psychosocial needs. Patient stated she was feeling overwhelmed but felt "better" after meeting with the treatment team and getting more information on her treatment plan.  Patients major concern in financial. CSW and patient discussed common feeling and emotions when being diagnosed with cancer, and the importance of support during treatment. CSW informed patient of the support team and support services at [facility]. CSW provided contact information and encouraged patient to call with any questions or concerns.  ONCBCN DISTRESS SCREENING 04/22/2017  Screening Type Initial Screening  Distress experienced in past week (1-10) 3  Emotional problem type Nervousness/Anxiety;Boredom  Physical Problem type Sleep/insomnia  Physician notified of physical symptoms Yes     Johnnye Lana, MSW, LCSW, OSW-C Clinical Social Worker [place] [phone]

## 2017-04-22 NOTE — Progress Notes (Signed)
Nutrition Assessment  Reason for Assessment:  Pt seen in Breast Clinic  ASSESSMENT:  56 year old female with right breast cancer.  Past medical history HTN, DM, high cholesterol.   Patient reports good appetite.  Medications:  reviewed  Labs: glucose 176  Anthropometrics:   Height: 61 inches Weight: 269 lb BMI: 51.1   NUTRITION DIAGNOSIS: Food and nutrition related knowledge deficit related to new diagnosis of breast cancer as evidenced by no prior need for nutrition related information.  INTERVENTION:   Discussed and provided packet of information regarding nutritional tips for breast cancer patients.  Questions answered.  Teachback method used.  Contact information provided and patient knows to contact me with questions/concerns.    MONITORING, EVALUATION, and GOAL: Pt will consume a healthy plant based diet to maintain lean body mass throughout treatment.   Anieya Helman B. Zenia Resides, [place], [place] Registered Dietitian [phone] (pager)

## 2017-04-22 NOTE — Patient Instructions (Signed)

## 2017-04-25 ENCOUNTER — Ambulatory Visit
Admission: RE | Admit: 2017-04-25 | Discharge: 2017-04-25 | Disposition: A | Discharge status: Home / Self Care | Payer: 59 | Source: Ambulatory Visit | Attending: Hematology and Oncology | Admitting: Hematology and Oncology

## 2017-04-25 DIAGNOSIS — C50211 Malignant neoplasm of upper-inner quadrant of right female breast: Secondary | ICD-10-CM

## 2017-04-25 DIAGNOSIS — Z170 Estrogen receptor positive status [ER+]: Principal | ICD-10-CM

## 2017-04-25 MED ORDER — GADOBENATE DIMEGLUMINE 529 MG/ML IV SOLN
20.0000 mL | Freq: Once | INTRAVENOUS | Status: AC | PRN
  Administered 2017-04-25: 20 mL via INTRAVENOUS

## 2017-04-28 ENCOUNTER — Telehealth: Payer: Self-pay | Admitting: *Deleted

## 2017-04-28 NOTE — Telephone Encounter (Signed)
Spoke to pt concerning [place] from 7.11.18. Denies questions or concerns regarding dx or treatment care plan. Discussed MRI and possibility of needing more bx. Informed Dr. Lucia Gaskins will review and discuss need for more bx. Encourage pt to call with needs. Received verbal understanding. Contact information provided.

## 2017-04-29 ENCOUNTER — Other Ambulatory Visit: Payer: 59

## 2017-05-04 ENCOUNTER — Other Ambulatory Visit: Payer: Self-pay | Admitting: Surgery

## 2017-05-04 ENCOUNTER — Other Ambulatory Visit: Payer: 59

## 2017-05-04 DIAGNOSIS — C50911 Malignant neoplasm of unspecified site of right female breast: Secondary | ICD-10-CM

## 2017-05-04 DIAGNOSIS — Z170 Estrogen receptor positive status [ER+]: Principal | ICD-10-CM

## 2017-05-05 ENCOUNTER — Other Ambulatory Visit: Payer: Self-pay | Admitting: Surgery

## 2017-05-05 DIAGNOSIS — N63 Unspecified lump in unspecified breast: Secondary | ICD-10-CM

## 2017-05-07 ENCOUNTER — Encounter (HOSPITAL_COMMUNITY): Payer: Self-pay | Admitting: Cardiology

## 2017-05-07 ENCOUNTER — Ambulatory Visit (HOSPITAL_COMMUNITY)
Admission: RE | Admit: 2017-05-07 | Discharge: 2017-05-07 | Disposition: A | Discharge status: Home / Self Care | Payer: 59 | Source: Ambulatory Visit | Attending: Family Medicine | Admitting: Family Medicine

## 2017-05-07 ENCOUNTER — Ambulatory Visit (HOSPITAL_BASED_OUTPATIENT_CLINIC_OR_DEPARTMENT_OTHER)
Admission: RE | Admit: 2017-05-07 | Discharge: 2017-05-07 | Disposition: A | Discharge status: Home / Self Care | Payer: 59 | Source: Ambulatory Visit | Attending: Cardiology | Admitting: Cardiology

## 2017-05-07 VITALS — BP 164/110 | HR 86 | Wt 266.8 lb

## 2017-05-07 DIAGNOSIS — C50211 Malignant neoplasm of upper-inner quadrant of right female breast: Secondary | ICD-10-CM | POA: Insufficient documentation

## 2017-05-07 DIAGNOSIS — I350 Nonrheumatic aortic (valve) stenosis: Secondary | ICD-10-CM | POA: Insufficient documentation

## 2017-05-07 DIAGNOSIS — Z170 Estrogen receptor positive status [ER+]: Secondary | ICD-10-CM | POA: Insufficient documentation

## 2017-05-07 DIAGNOSIS — Z7982 Long term (current) use of aspirin: Secondary | ICD-10-CM | POA: Diagnosis not present

## 2017-05-07 DIAGNOSIS — I10 Essential (primary) hypertension: Secondary | ICD-10-CM

## 2017-05-07 DIAGNOSIS — Z7984 Long term (current) use of oral hypoglycemic drugs: Secondary | ICD-10-CM | POA: Insufficient documentation

## 2017-05-07 DIAGNOSIS — E781 Pure hyperglyceridemia: Secondary | ICD-10-CM | POA: Diagnosis not present

## 2017-05-07 NOTE — Patient Instructions (Signed)
Echo and Follow up in 3 Months 

## 2017-05-08 ENCOUNTER — Other Ambulatory Visit: Payer: Self-pay | Admitting: Surgery

## 2017-05-08 ENCOUNTER — Ambulatory Visit
Admission: RE | Admit: 2017-05-08 | Discharge: 2017-05-08 | Disposition: A | Discharge status: Home / Self Care | Payer: 59 | Source: Ambulatory Visit | Attending: Surgery | Admitting: Surgery

## 2017-05-08 DIAGNOSIS — C50911 Malignant neoplasm of unspecified site of right female breast: Secondary | ICD-10-CM

## 2017-05-08 DIAGNOSIS — Z170 Estrogen receptor positive status [ER+]: Principal | ICD-10-CM

## 2017-05-08 DIAGNOSIS — N63 Unspecified lump in unspecified breast: Secondary | ICD-10-CM

## 2017-05-09 NOTE — Progress Notes (Signed)
Oncology: Dr. Lindi Adie  Renee Harris is referred by Dr. Lindi Adie.   56 yo with history of HTN and breast cancer presents for cardio-oncology evaluation.  Patient was diagnosed with breast cancer on the right in 7/18.  ER+/PR+/HER2+.  She will have lumpectomy followed by TCH-Perjeta x 6 months then Herceptin alone to complete a year.    BP is high today but she reports being very anxious.  BP usually 130s/80s on her medications.  Echo today showed mild aortic stenosis.  Other than this, she has no known cardiac problems.  No exertional chest pain or dyspnea.   PMH: 1. HTN 2. Hypertriglyceridemia 3. Breast cancer: Diagnosed 7/18, on right.  ER+/PR+/HER2+.  She will have lumpectomy followed by TCH-Perjeta x 6 months then Herceptin alone to complete a year.   - Echo (7/18): EF 65-70%, GLS -19.8%, mild AS with mean gradient 15 mmHg, normal RV size and systolic function.  4. Aortic stenosis: Mild on 7/18 echo.   Social History   Social History  . Marital status: Married    Spouse name: N/A  . Number of children: N/A  . Years of education: N/A   Occupational History  . Not on file.   Social History Main Topics  . Smoking status: Never Smoker  . Smokeless tobacco: Never Used  . Alcohol use Yes     Comment: 2 x year  . Drug use: No  . Sexual activity: Yes    Birth control/ protection: Surgical   Other Topics Concern  . Not on file   Social History Narrative  . No narrative on file   Family History  Problem Relation Age of Onset  . Lung cancer Maternal Grandmother   . Anesthesia problems Neg Hx   . Hypotension Neg Hx   . Malignant hyperthermia Neg Hx   . Pseudochol deficiency Neg Hx    ROS: All systems reviewed and negative except as per HPI.   Current Outpatient Prescriptions  Medication Sig Dispense Refill  . albuterol (PROVENTIL HFA;VENTOLIN HFA) 108 (90 BASE) MCG/ACT inhaler Inhale 1 puff into the lungs every 6 (six) hours as needed. For shortness of breath     . allopurinol  (ZYLOPRIM) 300 MG tablet Take 300 mg by mouth daily.    Marland Kitchen aspirin EC 81 MG tablet Take 81 mg by mouth daily.    . Black Cohosh 40 MG CAPS Take by mouth.    . diphenhydrAMINE (BENADRYL) 25 mg capsule Take 25 mg by mouth every 6 (six) hours as needed. For allergies     . escitalopram (LEXAPRO) 20 MG tablet Take 20 mg by mouth daily.    . fenofibrate 160 MG tablet Take 160 mg by mouth daily.    . hydrochlorothiazide (HYDRODIURIL) 12.5 MG tablet Take 12.5 mg by mouth daily.    Marland Kitchen losartan (COZAAR) 50 MG tablet Take 50 mg by mouth daily.      . metFORMIN (GLUCOPHAGE) 500 MG tablet Take 500 mg by mouth 2 (two) times daily with a meal.    . Multiple Vitamin (MULTIVITAMIN) tablet Take 1 tablet by mouth daily.     No current facility-administered medications for this encounter.    BP (!) 164/110 (BP Location: Right Arm, Patient Position: Sitting, Cuff Size: Normal)   Pulse 86   Wt 266 lb 12.8 oz (121 kg)   SpO2 97%   BMI 50.41 kg/m  General: NAD Neck: No JVD, no thyromegaly or thyroid nodule.  Lungs: Clear to auscultation bilaterally with normal respiratory  effort. CV: Nondisplaced PMI.  Heart regular S1/S2, no S3/S4, 2/6 early SEM RUSB with clear S2.  Trace ankle edema.  No carotid bruit.  Normal pedal pulses.  Abdomen: Soft, nontender, no hepatosplenomegaly, no distention.  Skin: Intact without lesions or rashes.  Neurologic: Alert and oriented x 3.  Psych: Normal affect. Extremities: No clubbing or cyanosis.  HEENT: Normal.   Assessment/Plan: 1. Breast cancer: Patient will be getting therapy with Herceptin for 1 year starting soon.  I reviewed today's echo. EF and strain pattern are normal.  We discussed the rationale behind echo screening while on Herceptin due to the risk of cardiotoxicity.  She will return for echo and office visit in 3 months.  2. HTN: BP high today but very nervous.  Has been in 537H systolic at home.   This is not ideal (new guidelines recommend < 432 systolic), but I  will not change her meds at this time.  3. Aortic stenosis: Mild.  Will need to be followed over time. Lipid control may help slow progression.   Loralie Champagne 05/09/2017

## 2017-05-13 ENCOUNTER — Ambulatory Visit
Admission: RE | Admit: 2017-05-13 | Discharge: 2017-05-13 | Disposition: A | Discharge status: Home / Self Care | Payer: 59 | Source: Ambulatory Visit | Attending: Surgery | Admitting: Surgery

## 2017-05-13 ENCOUNTER — Other Ambulatory Visit: Payer: Self-pay | Admitting: Surgery

## 2017-05-13 DIAGNOSIS — N63 Unspecified lump in unspecified breast: Secondary | ICD-10-CM

## 2017-05-13 MED ORDER — GADOBENATE DIMEGLUMINE 529 MG/ML IV SOLN
20.0000 mL | Freq: Once | INTRAVENOUS | Status: AC | PRN
  Administered 2017-05-13: 20 mL via INTRAVENOUS

## 2017-05-18 ENCOUNTER — Other Ambulatory Visit: Payer: Self-pay | Admitting: Surgery

## 2017-05-18 ENCOUNTER — Telehealth: Payer: Self-pay

## 2017-05-18 NOTE — Telephone Encounter (Signed)
Per Dr.Gudena, confirmed appt for tomorrow 330pm. Pt verified time/date.

## 2017-05-19 ENCOUNTER — Other Ambulatory Visit: Payer: Self-pay | Admitting: *Deleted

## 2017-05-19 ENCOUNTER — Ambulatory Visit (HOSPITAL_BASED_OUTPATIENT_CLINIC_OR_DEPARTMENT_OTHER): Payer: 59 | Admitting: Hematology and Oncology

## 2017-05-19 ENCOUNTER — Encounter: Payer: Self-pay | Admitting: Hematology and Oncology

## 2017-05-19 VITALS — BP 139/67 | HR 73 | Temp 98.0°F | Resp 18 | Ht 61.0 in | Wt 268.6 lb

## 2017-05-19 DIAGNOSIS — C50211 Malignant neoplasm of upper-inner quadrant of right female breast: Secondary | ICD-10-CM

## 2017-05-19 DIAGNOSIS — Z170 Estrogen receptor positive status [ER+]: Principal | ICD-10-CM

## 2017-05-19 DIAGNOSIS — C773 Secondary and unspecified malignant neoplasm of axilla and upper limb lymph nodes: Secondary | ICD-10-CM | POA: Diagnosis not present

## 2017-05-19 MED ORDER — DEXAMETHASONE 4 MG PO TABS: 4.0000 mg | ORAL_TABLET | Freq: Every day | ORAL | 0 refills | Status: DC

## 2017-05-19 MED ORDER — LIDOCAINE-PRILOCAINE 2.5-2.5 % EX CREA: TOPICAL_CREAM | CUTANEOUS | 3 refills | Status: DC

## 2017-05-19 MED ORDER — PROCHLORPERAZINE MALEATE 10 MG PO TABS: 10.0000 mg | ORAL_TABLET | Freq: Four times a day (QID) | ORAL | 1 refills | Status: DC | PRN

## 2017-05-19 MED ORDER — ONDANSETRON HCL 8 MG PO TABS: 8.0000 mg | ORAL_TABLET | Freq: Two times a day (BID) | ORAL | 1 refills | Status: DC | PRN

## 2017-05-19 MED ORDER — ZOLPIDEM TARTRATE 10 MG PO TABS: 10.0000 mg | ORAL_TABLET | Freq: Every evening | ORAL | 0 refills | Status: DC | PRN

## 2017-05-19 NOTE — Progress Notes (Signed)
START ON PATHWAY REGIMEN - Breast     A cycle is every 21 days:     Pertuzumab      Pertuzumab      Trastuzumab      Trastuzumab      Carboplatin      Docetaxel   **Always confirm dose/schedule in your pharmacy ordering system**    Patient Characteristics: Preoperative or Nonsurgical Candidate (Clinical Staging), Neoadjuvant Therapy followed by Surgery, Invasive Disease, Chemotherapy, HER2 Positive, ER Positive Therapeutic Status: Preoperative or Nonsurgical Candidate (Clinical Staging) AJCC M Category: cM0 AJCC Grade: G3 Breast Surgical Plan: Neoadjuvant Therapy followed by Surgery ER Status: Positive (+) AJCC 8 Stage Grouping: IB HER2 Status: Positive (+) AJCC T Category: cT2 AJCC N Category: cN1 PR Status: Positive (+) Intent of Therapy: Curative Intent, Discussed with Patient 

## 2017-05-19 NOTE — Progress Notes (Signed)
Patient Care Team: Sharilyn Sites, MD as PCP - General (Family Medicine) Alphonsa Overall, MD as Consulting Physician (General Surgery) Nicholas Lose, MD as Consulting Physician (Hematology and Oncology) Kyung Rudd, MD as Consulting Physician (Radiation Oncology)  DIAGNOSIS:  Encounter Diagnosis  Name Primary?  . Malignant neoplasm of upper-inner quadrant of right breast in female, estrogen receptor positive ([place]) Yes    SUMMARY OF ONCOLOGIC HISTORY:   Malignant neoplasm of upper-inner quadrant of right breast in female, estrogen receptor positive ([place])   04/13/2017 Initial Diagnosis    Screening detected right breast mass with calcifications 2.1 cm in size and 3.4 cm in size. Calcifications were fibroadenoma. Right breast biopsy upper inner quadrant 1:00 mass: IDC with DCIS with necrosis, grade 3, ER 100%, PR 70%, Ki-67 60%, HER-2 positive ratio 2.34, T2 N1 stage IB (New AJCC)      04/25/2017 Breast MRI    Right breast upper inner quadrant 2.2 x 1.7 x 2.7 cm mass, abnormal area of clumped non-mass enhancement in the right lateral breast 3.2 x 1.9 x 1 cm, too abnormal lymph nodes right axilla 4.3 and 1.7 cm (biopsy-proven breast cancer)       05/08/2017 Procedure    Right axilla lymph node biopsy: Metastatic carcinoma      05/13/2017 Procedure    Right breast biopsy retroareolar region: Intraductal papilloma       CHIEF COMPLIANT: follow-up after recent lymph node biopsy showing metastatic carcinoma in the axilla  INTERVAL HISTORY: Renee Harris is a 56 year old with above-mentioned history of right breast ER/PR positive HER-2 positive breast cancer who is here to discuss the recent findings of a positive lymph node in the axilla for metastatic cancer. The lymph node was extremely large at 4.3 cm. Because of this we are rethinking our strategy and are now recommending neoadjuvant chemotherapy for her. She is here today accompanied by her husband and daughter. She is anxious to get  the treatment started. Port will be placed next Monday. Echocardiogram has already been performed. She has gone through chemotherapy class as well.  REVIEW OF SYSTEMS:   Constitutional: Denies fevers, chills or abnormal weight loss Eyes: Denies blurriness of vision Ears, nose, mouth, throat, and face: Denies mucositis or sore throat Respiratory: Denies cough, dyspnea or wheezes Cardiovascular: Denies palpitation, chest discomfort Gastrointestinal:  Denies nausea, heartburn or change in bowel habits Skin: Denies abnormal skin rashes Lymphatics: Denies new lymphadenopathy or easy bruising Neurological:Denies numbness, tingling or new weaknesses Behavioral/Psych: Mood is stable, no new changes  Extremities: No lower extremity edema  All other systems were reviewed with the patient and are negative.  I have reviewed the past medical history, past surgical history, social history and family history with the patient and they are unchanged from previous note.  ALLERGIES:  is allergic to pyridostigmine bromide and statins.  MEDICATIONS:  Current Outpatient Prescriptions  Medication Sig Dispense Refill  . albuterol (PROVENTIL HFA;VENTOLIN HFA) 108 (90 BASE) MCG/ACT inhaler Inhale 1 puff into the lungs every 6 (six) hours as needed. For shortness of breath     . allopurinol (ZYLOPRIM) 300 MG tablet Take 300 mg by mouth daily.    Marland Kitchen aspirin EC 81 MG tablet Take 81 mg by mouth daily.    . Black Cohosh 40 MG CAPS Take by mouth.    . dexamethasone (DECADRON) 4 MG tablet Take 1 tablet (4 mg total) by mouth daily. 1 tab day before chemo and 1 tab day after 12 tablet 0  . diphenhydrAMINE (  BENADRYL) 25 mg capsule Take 25 mg by mouth every 6 (six) hours as needed. For allergies     . escitalopram (LEXAPRO) 20 MG tablet Take 20 mg by mouth daily.    . fenofibrate 160 MG tablet Take 160 mg by mouth daily.    . hydrochlorothiazide (HYDRODIURIL) 12.5 MG tablet Take 12.5 mg by mouth daily.    Marland Kitchen  lidocaine-prilocaine (EMLA) cream Apply to affected area once 30 g 3  . losartan (COZAAR) 50 MG tablet Take 50 mg by mouth daily.      . metFORMIN (GLUCOPHAGE) 500 MG tablet Take 500 mg by mouth 2 (two) times daily with a meal.    . Multiple Vitamin (MULTIVITAMIN) tablet Take 1 tablet by mouth daily.    . ondansetron (ZOFRAN) 8 MG tablet Take 1 tablet (8 mg total) by mouth 2 (two) times daily as needed for refractory nausea / vomiting. Start on day 3 after chemo. 30 tablet 1  . prochlorperazine (COMPAZINE) 10 MG tablet Take 1 tablet (10 mg total) by mouth every 6 (six) hours as needed (Nausea or vomiting). 30 tablet 1   No current facility-administered medications for this visit.     PHYSICAL EXAMINATION: ECOG PERFORMANCE STATUS: 1 - Symptomatic but completely ambulatory  Vitals:   05/19/17 1549  BP: 139/67  Pulse: 73  Resp: 18  Temp: 98 F (36.7 C)   Filed Weights   05/19/17 1549  Weight: 268 lb 9.6 oz (121.8 kg)    GENERAL:alert, no distress and comfortable SKIN: skin color, texture, turgor are normal, no rashes or significant lesions EYES: normal, Conjunctiva are pink and non-injected, sclera clear OROPHARYNX:no exudate, no erythema and lips, buccal mucosa, and tongue normal  NECK: supple, thyroid normal size, non-tender, without nodularity LYMPH:  no palpable lymphadenopathy in the cervical, axillary or inguinal LUNGS: clear to auscultation and percussion with normal breathing effort HEART: regular rate & rhythm and no murmurs and no lower extremity edema ABDOMEN:abdomen soft, non-tender and normal bowel sounds MUSCULOSKELETAL:no cyanosis of digits and no clubbing  NEURO: alert & oriented x 3 with fluent speech, no focal motor/sensory deficits EXTREMITIES: No lower extremity edema  LABORATORY DATA:  I have reviewed the data as listed   Chemistry      Component Value Date/Time   NA 142 04/22/2017 0831   K 4.1 04/22/2017 0831   CL 101 11/02/2015 2052   CO2 24  04/22/2017 0831   BUN 15.3 04/22/2017 0831   CREATININE 1.0 04/22/2017 0831      Component Value Date/Time   CALCIUM 10.7 (H) 04/22/2017 0831   ALKPHOS 68 04/22/2017 0831   AST 29 04/22/2017 0831   ALT 35 04/22/2017 0831   BILITOT 0.51 04/22/2017 0831       Lab Results  Component Value Date   WBC 7.2 04/22/2017   HGB 13.8 04/22/2017   HCT 41.9 04/22/2017   MCV 85.7 04/22/2017   PLT 184 04/22/2017   NEUTROABS 4.3 04/22/2017    ASSESSMENT & PLAN:  Malignant neoplasm of upper-inner quadrant of right breast in female, estrogen receptor positive ([place]) 04/13/2016: Screening detected right breast mass with calcifications 2.1 cm in size and 3.4 cm in size. Calcifications were fibroadenoma. Right breast biopsy upper inner quadrant 1:00 mass: IDC with DCIS with necrosis, grade 3, ER 100%, PR 70%, Ki-67 60%, HER-2 positive ratio 2.34, T2 N1 stage II B  04/25/17: Breast MRI Right breast upper inner quadrant 2.2 x 1.7 x 2.7 cm mass, abnormal area of clumped  non-mass enhancement in the right lateral breast 3.2 x 1.9 x 1 cm, too abnormal lymph nodes right axilla 4.3 and 1.7 cm (biopsy-proven breast cancer) ---------------------------------------------------------------------------- Recommendation: 1. Neoadjuvant chemotherapy with TCH Perjeta 6 cycles followed by Herceptin +/- Perjeta maintenance for 1 year.  2. Followed by breast conserving surgery with targeted axillary dissection if possible (if she has an excellent response in axilla) 3. Followed by adjuvant radiation 4. Followed by adjuvant antiestrogen therapy +/- Neratinib  Echocardiogram 05/08/2017: EF 65-70%  I counseled the patient extensively regarding chemotherapy. If she would like to receive this treatment in [place], we can try to arrange it there. I called and discussed the case with Dr. Talbert Cage who has kindly accepted the patient.  I will pass the case to Dr. Talbert Cage to guide her treatment plan. I placed the orders and sent the  prescriptions to her pharmacy to get a prior authorization started.  We are available if there are any questions or concerns.  I spent 25 minutes talking to the patient of which more than half was spent in counseling and coordination of care.  Orders Placed This Encounter  Procedures  . CBC with Differential    Standing Status:   Standing    Number of Occurrences:   20    Standing Expiration Date:   05/20/2018  . Comprehensive metabolic panel    Standing Status:   Standing    Number of Occurrences:   20    Standing Expiration Date:   05/20/2018  . PHYSICIAN COMMUNICATION ORDER    A baseline Echo/[place] should be obtained prior to initiation of Herceptin, at 3, 6, 9 months during Herceptin  Treatment.   The patient has a good understanding of the overall plan. she agrees with it. she will call with any problems that may develop before the next visit here.   Rulon Eisenmenger, MD 05/19/17

## 2017-05-19 NOTE — Assessment & Plan Note (Signed)
04/13/2016: Screening detected right breast mass with calcifications 2.1 cm in size and 3.4 cm in size. Calcifications were fibroadenoma. Right breast biopsy upper inner quadrant 1:00 mass: IDC with DCIS with necrosis, grade 3, ER 100%, PR 70%, Ki-67 60%, HER-2 positive ratio 2.34, T2 N1 stage II B  04/25/17: Breast MRI Right breast upper inner quadrant 2.2 x 1.7 x 2.7 cm mass, abnormal area of clumped non-mass enhancement in the right lateral breast 3.2 x 1.9 x 1 cm, too abnormal lymph nodes right axilla 4.3 and 1.7 cm (biopsy-proven breast cancer) ----------------------------------------------------------------------------Recommendation: 1. Neoadjuvant chemotherapy with TCH Perjeta 6 cycles followed by Herceptin and Perjeta maintenance for 1 year.  2.followed by breast conserving surgery with targeted axillary dissection if possible Followed by adjuvant radiation 4. Followed by adjuvant antiestrogen therapy +/- Neratinib   I counseled the patient extensively regarding chemotherapy. If she would like to receive this treatment in [place], we can try to arrange it there.

## 2017-05-20 ENCOUNTER — Telehealth (HOSPITAL_COMMUNITY): Payer: Self-pay | Admitting: Emergency Medicine

## 2017-05-20 NOTE — Telephone Encounter (Signed)
Called pt to introduced myself and explain a little bit about my role as the patient navigator.  Phone number provided.  Told pt to call if they had any questions or concerns.    Appoints given for CT scans, 8/15 arrive at 1:45 and pick up contrast day before when at whole body scan. Whole body scan on 8/14 at 11:30 am. She knows port will be placed on 8/13. She will start chemo on 8/17 at 8:30 am.  She understand this will take all day.  We went over dexamethasone and ow to take it.  Went over 2 different nausea medications and how to take those. Development worker, community and Education officer, museum numbers provided.  Information will be provided on resources when she comes in on Friday to start chemo. All questions answered.

## 2017-05-21 ENCOUNTER — Other Ambulatory Visit: Payer: Self-pay | Admitting: *Deleted

## 2017-05-21 DIAGNOSIS — Z170 Estrogen receptor positive status [ER+]: Principal | ICD-10-CM

## 2017-05-21 DIAGNOSIS — C50211 Malignant neoplasm of upper-inner quadrant of right female breast: Secondary | ICD-10-CM

## 2017-05-21 NOTE — Patient Instructions (Addendum)
TEYA OTTERSON  05/21/2017   Your procedure is scheduled on: 05/25/2017   Report to [facility] Main  Entrance Take [place]  elevators to 3rd floor to  [place] at   0930 AM.     Call this number if you have problems the morning of surgery [phone]    Remember: ONLY 1 PERSON MAY GO WITH YOU TO SHORT STAY TO GET  READY MORNING OF [place].  Do not eat food or drink liquids :After Midnight.     Take these medicines the morning of surgery with A SIP OF WATER: Albuterol Inhaler if needed and bring with you to hospital                                 You may not have any metal on your body including hair pins and              piercings  Do not wear jewelry, make-up, lotions, powders or perfumes, deodorant             Do not wear nail polish.  Do not shave  48 hours prior to surgery.     Do not bring valuables to the hospital. [place].  Contacts, dentures or bridgework may not be worn into surgery.       Patients discharged the day of surgery will not be allowed to drive home.  Name and phone number of your driver:               Please read over the following fact sheets you were given: _____________________________________________________________________             [facility] - Preparing for Surgery Before surgery, you can play an important role.  Because skin is not sterile, your skin needs to be as free of germs as possible.  You can reduce the number of germs on your skin by washing with CHG (chlorahexidine gluconate) soap before surgery.  CHG is an antiseptic cleaner which kills germs and bonds with the skin to continue killing germs even after washing. Please DO NOT use if you have an allergy to CHG or antibacterial soaps.  If your skin becomes reddened/irritated stop using the CHG and inform your nurse when you arrive at Short Stay. Do not shave (including legs and underarms) for at  least 48 hours prior to the first CHG shower.  You may shave your face/neck. Please follow these instructions carefully:  1.  Shower with CHG Soap the night before surgery and the  morning of Surgery.  2.  If you choose to wash your hair, wash your hair first as usual with your  normal  shampoo.  3.  After you shampoo, rinse your hair and body thoroughly to remove the  shampoo.                           4.  Use CHG as you would any other liquid soap.  You can apply chg directly  to the skin and wash                       Gently with a scrungie or  clean washcloth.  5.  Apply the CHG Soap to your body ONLY FROM THE NECK DOWN.   Do not use on face/ open                           Wound or open sores. Avoid contact with eyes, ears mouth and genitals (private parts).                       Wash face,  Genitals (private parts) with your normal soap.             6.  Wash thoroughly, paying special attention to the area where your surgery  will be performed.  7.  Thoroughly rinse your body with warm water from the neck down.  8.  DO NOT shower/wash with your normal soap after using and rinsing off  the CHG Soap.                9.  Pat yourself dry with a clean towel.            10.  Wear clean pajamas.            11.  Place clean sheets on your bed the night of your first shower and do not  sleep with pets. Day of Surgery : Do not apply any lotions/deodorants the morning of surgery.  Please wear clean clothes to the hospital/surgery center.  FAILURE TO FOLLOW THESE INSTRUCTIONS MAY RESULT IN THE CANCELLATION OF YOUR SURGERY PATIENT SIGNATURE_________________________________  NURSE SIGNATURE__________________________________  ________________________________________________________________________

## 2017-05-22 ENCOUNTER — Encounter (HOSPITAL_COMMUNITY)
Admission: RE | Admit: 2017-05-22 | Discharge: 2017-05-22 | Disposition: A | Discharge status: Home / Self Care | Payer: 59 | Source: Ambulatory Visit | Attending: Surgery | Admitting: Surgery

## 2017-05-22 ENCOUNTER — Encounter (HOSPITAL_COMMUNITY): Payer: Self-pay

## 2017-05-22 DIAGNOSIS — Z6841 Body Mass Index (BMI) 40.0 and over, adult: Secondary | ICD-10-CM | POA: Diagnosis not present

## 2017-05-22 DIAGNOSIS — I10 Essential (primary) hypertension: Secondary | ICD-10-CM | POA: Diagnosis not present

## 2017-05-22 DIAGNOSIS — Z79899 Other long term (current) drug therapy: Secondary | ICD-10-CM | POA: Diagnosis not present

## 2017-05-22 DIAGNOSIS — M1990 Unspecified osteoarthritis, unspecified site: Secondary | ICD-10-CM | POA: Diagnosis not present

## 2017-05-22 DIAGNOSIS — Z90710 Acquired absence of both cervix and uterus: Secondary | ICD-10-CM | POA: Diagnosis not present

## 2017-05-22 DIAGNOSIS — Z7984 Long term (current) use of oral hypoglycemic drugs: Secondary | ICD-10-CM | POA: Diagnosis not present

## 2017-05-22 DIAGNOSIS — E7800 Pure hypercholesterolemia, unspecified: Secondary | ICD-10-CM | POA: Diagnosis not present

## 2017-05-22 DIAGNOSIS — C50911 Malignant neoplasm of unspecified site of right female breast: Secondary | ICD-10-CM | POA: Diagnosis present

## 2017-05-22 HISTORY — DX: Personal history of urinary calculi: Z87.442

## 2017-05-22 HISTORY — DX: Sleep apnea, unspecified: G47.30

## 2017-05-22 LAB — CBC
HEMATOCRIT: 40.9 % (ref 36.0–46.0)
Hemoglobin: 13.5 g/dL (ref 12.0–15.0)
MCH: 28.1 pg (ref 26.0–34.0)
MCHC: 33 g/dL (ref 30.0–36.0)
MCV: 85.2 fL (ref 78.0–100.0)
PLATELETS: 221 10*3/uL (ref 150–400)
RBC: 4.8 MIL/uL (ref 3.87–5.11)
RDW: 13.6 % (ref 11.5–15.5)
WBC: 8.4 10*3/uL (ref 4.0–10.5)

## 2017-05-22 LAB — BASIC METABOLIC PANEL
Anion gap: 9 (ref 5–15)
BUN: 21 mg/dL — ABNORMAL HIGH (ref 6–20)
CHLORIDE: 105 mmol/L (ref 101–111)
CO2: 28 mmol/L (ref 22–32)
Calcium: 10.7 mg/dL — ABNORMAL HIGH (ref 8.9–10.3)
Creatinine, Ser: 0.86 mg/dL (ref 0.44–1.00)
Glucose, Bld: 157 mg/dL — ABNORMAL HIGH (ref 65–99)
POTASSIUM: 4.3 mmol/L (ref 3.5–5.1)
SODIUM: 142 mmol/L (ref 135–145)

## 2017-05-22 LAB — SURGICAL PCR SCREEN
MRSA, PCR: NEGATIVE
STAPHYLOCOCCUS AUREUS: POSITIVE — AB

## 2017-05-22 LAB — GLUCOSE, CAPILLARY: GLUCOSE-CAPILLARY: 151 mg/dL — AB (ref 65–99)

## 2017-05-22 NOTE — Progress Notes (Signed)
[place] cardiology Dr Aundra Dubin 05-07-17 epic  "Echo today showed mild aortic stenosis.  Other than this, she has no known cardiac problems.  No exertional chest pain or dyspnea. "  ECHO 05-07-17 epic

## 2017-05-23 LAB — HEMOGLOBIN A1C
HEMOGLOBIN A1C: 6.9 % — AB (ref 4.8–5.6)
MEAN PLASMA GLUCOSE: 151 mg/dL

## 2017-05-24 MED ORDER — DEXTROSE 5 % IV SOLN
3.0000 g | INTRAVENOUS | Status: AC
  Administered 2017-05-25: 3 g via INTRAVENOUS
  Filled 2017-05-24 (×2): qty 3000

## 2017-05-24 NOTE — H&P (Signed)
Renee Harris  Location: [facility] Surgery Patient #: [id number] DOB: 14-Jul-1961 Undefined / Language: Cleophus Harris / Race: White Female  History of Present Illness   The patient is a 56 year old female who presents with a complaint of Right breast cancer.  The PCP is Renee Harris  The patient was referred by Dr. Everlean Harris  The pateint is at the Breast [facility] - Oncology is Drs. Gudena and [place] Husband, Renee Harris, with her, and her sister, Renee Harris.  The patient had never had a mammogram. She went to the breast center on 03/23/2017 at the insistence of her primary care doctor to get a mammogram. Her mammogram showed in the right breast a mass and an area of calcifications that was suspicious. She returned to [facility] on June 26 and was found to have 2 areas that were suspicious in the right breast on mammogram. She had a hysterectomy in 2004 for fibroids. She had noticed no change in her breasts. She has no family history of breast cancer. She is not on hormone replacement therapy.  Mammograms: Mammogram at [place] (04/07/2017) - 1. Highly suspicious right breast mass with associated calcifications measuring 2.1 cm. 2. Additional indeterminate calcifications in the retroareolar right breast. (on the final clip films, these areas were much further apart than at [place] presentation) Biopsy: 04/13/2017 ([id number]) - UIQ mass at 1 o'clock - IDC, grade 3, ER - 100%, PR - 70%, Ki67 - 60%, Her2Neu - POSITIVE .Renee KitchenMarland Harris UOQ Ca++ - fibroadenoma Family history of breast or ovarian cancer: No On hormone therapy: No  I discussed the options for breast cancer treatment with the patient. The patient is at the [facility], which includes medical oncology and radiation oncology. I discussed the surgical options of lumpectomy vs. mastectomy. If mastectomy, there is the possibility of reconstruction. I discussed the  options of lymph node biopsy. The treatment plan depends on the pathologic staging of the tumor and the patient's personal wishes. The risks of surgery include, but are not limited to, bleeding, infection, the need for further surgery, and nerve injury. The patient has been given literature on the treatment of breast cancer.  We discussed the mammogram, the 2 biopsies, and the subsequent images of the right breast. There is some concern about the cancer/microcalcifications covering a wider area and therefore we'll get an MRI prior to scheduling surgery.  Plan: 1) MRI of breasts - she proved to have right axillary lymph node positive for cancer, 2) power port placement, 3) Neoadjuvant chemot tx for Her2Neu,   I discussed the indications and potential complications of the power port placement.  The primary complications of the power port, include, but are not limited to, bleeding, infection, nerve injury, thrombosis, and pneumothorax.  Past Medical History: 1. HTN - for 15 years 2. Asthma - seasonal - mainly winter 3. History of kidney stones Sees Dr. Leo Harris in [place] 4. Lap chole Renee Harris - 05/2011 5. Never had colonoscopy - we talked about this 6. "wall eyes" - exotropia 7. Heart murmur - 3/6 remote echo 8. HIstory of ocular myasthenia gravis She took steroids for a long time for this, but stopped about 20 years ago.  Social History: Husband, Renee Harris, with her, and her sister, Renee Harris. No children. She keeps her neice's kids. No other job.    Past Surgical History Renee Slipper, RN; 04/22/2017 7:38 AM) Breast Biopsy  Right. Gallbladder Surgery - Laparoscopic  Hysterectomy (due to cancer) - Complete  Oral Surgery   Diagnostic Studies History Renee Slipper, RN; 04/22/2017 7:38 AM) Colonoscopy  never Mammogram  within last year Pap Smear  >5 years ago  Medication History Renee Slipper, RN; 04/22/2017 7:38 AM) Medications  Reconciled  Social History Renee Slipper, RN; 04/22/2017 7:38 AM) Alcohol use  Occasional alcohol use. Caffeine use  Carbonated beverages, Coffee. No drug use  Tobacco use  Never smoker.  Family History Renee Slipper, RN; 04/22/2017 7:38 AM) Arthritis  Family Members In General, Sister. Cerebrovascular Accident  Father. Diabetes Mellitus  Family Members In General, Father, Mother. Heart Disease  Brother, Mother. Heart disease in female family member before age 17  Hypertension  Family Members In General, Mother, Sister. Respiratory Condition  Family Members In General.  Pregnancy / Birth History Renee Slipper, RN; 04/22/2017 7:38 AM) Age at menarche  13 years. Age of menopause  <47 Irregular periods   Other Problems Renee Slipper, RN; 04/22/2017 7:38 AM) Anxiety Disorder  Arthritis  Back Pain  Cholelithiasis  Diabetes Mellitus  Gastroesophageal Reflux Disease  Heart murmur  Hemorrhoids  High blood pressure  Hypercholesterolemia  Kidney Stone  Lump In Breast  Oophorectomy  Bilateral. Sleep Apnea  Transfusion history     Review of Systems Renee Slipper RN; 04/22/2017 7:38 AM) General Not Present- Appetite Loss, Chills, Fatigue, Fever, Night Sweats, Weight Gain and Weight Loss. Skin Not Present- Change in Wart/Mole, Dryness, Hives, Jaundice, New Lesions, Non-Healing Wounds, Rash and Ulcer. HEENT Present- Ringing in the Ears and Wears glasses/contact lenses. Not Present- Earache, Hearing Loss, Hoarseness, Nose Bleed, Oral Ulcers, Seasonal Allergies, Sinus Pain, Sore Throat, Visual Disturbances and Yellow Eyes. Respiratory Present- Snoring. Not Present- Bloody sputum, Chronic Cough, Difficulty Breathing and Wheezing. Breast Not Present- Breast Mass, Breast Pain, Nipple Discharge and Skin Changes. Cardiovascular Not Present- Chest Pain, Difficulty Breathing Lying Down, Leg Cramps, Palpitations, Rapid Heart Rate, Shortness of Breath and Swelling of  Extremities. Gastrointestinal Present- Hemorrhoids. Not Present- Abdominal Pain, Bloating, Bloody Stool, Change in Bowel Habits, Chronic diarrhea, Constipation, Difficulty Swallowing, Excessive gas, Gets full quickly at meals, Indigestion, Nausea, Rectal Pain and Vomiting. Female Genitourinary Present- Frequency, Nocturia and Urgency. Not Present- Painful Urination and Pelvic Pain. Musculoskeletal Present- Joint Pain. Not Present- Back Pain, Joint Stiffness, Muscle Pain, Muscle Weakness and Swelling of Extremities. Neurological Present- Headaches. Not Present- Decreased Memory, Fainting, Numbness, Seizures, Tingling, Tremor, Trouble walking and Weakness. Psychiatric Present- Anxiety and Change in Sleep Pattern. Not Present- Bipolar, Depression, Fearful and Frequent crying. Endocrine Present- Heat Intolerance and New Diabetes. Not Present- Cold Intolerance, Excessive Hunger, Hair Changes and Hot flashes.   Physical Exam Shanon Brow H. Lucia Gaskins MD; 04/22/2017 12:24 PM) General: WN obese WFalert and generally healthy appearing. Skin: Inspection and palpation of the skin unremarkable.  Eyes: Conjunctivae white, Has "wall eye" (exotropia) - she said she had surgery when she was young, but it did not work Face, ears, nose, mouth, and throat: Face - normal. Normal ears and nose. Lips and teeth normal.  Neck: Supple. No mass. Trachea midline. No thyroid mass.  Lymph Nodes: No supraclavicular or cervical adenopathy. No axillary adenopathy.  Lungs: Normal respiratory effort. Clear to auscultation and symmetric breath sounds. Cardiovascular: Regular rate and rythm. Normal auscultation of the heart. Has 3/6 murmur.  Breasts: Right - Bruise along lateral right nipple. She has a mass effect above the nipple - tumor vs hematoma. No skin involvement. No nipple discharge. Left - No mass or nodule.  Abdomen: Soft. No mass. Liver and spleen not palpable. No tenderness.  No hernia. Normal bowel  sounds.  Lower midline scar from hysterectomy. Pannus with skin breakdown below the pannus. Rectal: Not done.  Musculoskeletal/extremities: Normal gait. Good strength and ROM in upper and lower extremities.   Neurologic: Grossly intact to motor and sensory function.   Psychiatric: Has normal mood and affect. Judgement and insight appear normal.    Assessment & Plan  1.  MALIGNANT NEOPLASM OF RIGHT BREAST, STAGE 1, ESTROGEN RECEPTOR POSITIVE (C50.911)  Story: Mammogram at [place] (04/07/2017) - 1. Highly suspicious right breast mass with associated calcifications measuring 2.1 cm. 2. Additional indeterminate calcifications in the retroareolar right breast. (on the final clip films, these areas were much further apart than at [place] presentation)  Biopsy: 04/13/2017 ([id number]) - UIQ mass at 1 o'clock - IDC, grade 3, ER - 100%, PR - 70%, Ki67 - 60%, Her2Neu - POSITIVE .Renee KitchenMarland Harris UOQ Ca++ - fibroadenoma   Oncology - Lindi Adie and [place]  Plan:   1) MRI of breasts  MRI of breast - 1) Right breast biopsy proven malignancy measures 2.7 cm in greatest dimension.   2) An area of clumped non mass enhancement in the right lateral breast at the level of the nipple, anterior depth. This may correspond to the site of recent stereotactic core needle biopsy, however the extent of enhancement is felt to be out of proportion to the mammographically seen abnormality, and/or expected post biopsy changes. Therefore, MRI guided core needle biopsy is recommended for this area.   3) Two abnormal lymph nodal masses in the right axilla. Second-look ultrasound is recommended to plan for ultrasound-guided core needle biopsy of probably malignant right axillary lymphadenopathy. No MRI evidence of malignancy in the left breast.   Addendum Note(Amorina Doerr H. Monay Houlton MD; 05/18/2017 3:34 PM) Her right axillary lymph node biopsy came back positive for cancer (05/13/2017) She had a second  biopsy of her right breast which showed a papilloma.   2)  Neoadjuvant chemotherapy - needs power port placement,  3)  Depending on response to chemotherapy - will decide about surgery  2.  Morbid obesity 3. HTN - for 15 years 4. Asthma - seasonal - mainly winter 5. History of kidney stones Sees Dr. Leo Harris in [place] 6. "wall eyes" - exotropia 7. Heart murmur - 3/6 Echo from 05/07/2017 showed some mild AS 8. HIstory of ocular myasthenia gravis She took steroids for a long time for this, but stopped about 20 years ago.  Alphonsa Overall, MD, [facility] Pager: [phone] Office phone:  [phone]

## 2017-05-25 ENCOUNTER — Ambulatory Visit (HOSPITAL_COMMUNITY)
Admission: RE | Admit: 2017-05-25 | Discharge: 2017-05-25 | Disposition: A | Discharge status: Home / Self Care | Payer: 59 | Source: Ambulatory Visit | Attending: Surgery | Admitting: Surgery

## 2017-05-25 ENCOUNTER — Encounter: Payer: Self-pay | Admitting: Adult Health

## 2017-05-25 ENCOUNTER — Encounter (HOSPITAL_COMMUNITY)
Admission: RE | Disposition: A | Discharge status: Home / Self Care | Payer: Self-pay | Source: Ambulatory Visit | Attending: Surgery

## 2017-05-25 ENCOUNTER — Encounter (HOSPITAL_COMMUNITY): Payer: Self-pay | Admitting: Anesthesiology

## 2017-05-25 ENCOUNTER — Ambulatory Visit (HOSPITAL_COMMUNITY): Payer: 59

## 2017-05-25 ENCOUNTER — Ambulatory Visit (HOSPITAL_COMMUNITY): Payer: 59 | Admitting: Certified Registered Nurse Anesthetist

## 2017-05-25 DIAGNOSIS — I10 Essential (primary) hypertension: Secondary | ICD-10-CM | POA: Insufficient documentation

## 2017-05-25 DIAGNOSIS — E7800 Pure hypercholesterolemia, unspecified: Secondary | ICD-10-CM | POA: Insufficient documentation

## 2017-05-25 DIAGNOSIS — Z95828 Presence of other vascular implants and grafts: Secondary | ICD-10-CM

## 2017-05-25 DIAGNOSIS — Z6841 Body Mass Index (BMI) 40.0 and over, adult: Secondary | ICD-10-CM | POA: Insufficient documentation

## 2017-05-25 DIAGNOSIS — Z79899 Other long term (current) drug therapy: Secondary | ICD-10-CM | POA: Insufficient documentation

## 2017-05-25 DIAGNOSIS — Z90710 Acquired absence of both cervix and uterus: Secondary | ICD-10-CM | POA: Insufficient documentation

## 2017-05-25 DIAGNOSIS — M1990 Unspecified osteoarthritis, unspecified site: Secondary | ICD-10-CM | POA: Insufficient documentation

## 2017-05-25 DIAGNOSIS — Z7984 Long term (current) use of oral hypoglycemic drugs: Secondary | ICD-10-CM | POA: Insufficient documentation

## 2017-05-25 DIAGNOSIS — C50911 Malignant neoplasm of unspecified site of right female breast: Secondary | ICD-10-CM | POA: Diagnosis not present

## 2017-05-25 HISTORY — PX: PORTACATH PLACEMENT: SHX2246

## 2017-05-25 LAB — GLUCOSE, CAPILLARY
Glucose-Capillary: 167 mg/dL — ABNORMAL HIGH (ref 65–99)
Glucose-Capillary: 142 mg/dL — ABNORMAL HIGH (ref 65–99)

## 2017-05-25 SURGERY — INSERTION, TUNNELED CENTRAL VENOUS DEVICE, WITH PORT
Anesthesia: General

## 2017-05-25 MED ORDER — HYDROCODONE-ACETAMINOPHEN 5-325 MG PO TABS: 1.0000 | ORAL_TABLET | Freq: Four times a day (QID) | ORAL | 0 refills | Status: DC | PRN

## 2017-05-25 MED ORDER — PHENYLEPHRINE 40 MCG/ML (10ML) SYRINGE FOR IV PUSH (FOR BLOOD PRESSURE SUPPORT)
PREFILLED_SYRINGE | INTRAVENOUS | Status: DC | PRN
  Administered 2017-05-25 (×7): 80 ug via INTRAVENOUS

## 2017-05-25 MED ORDER — HEPARIN SOD (PORK) LOCK FLUSH 100 UNIT/ML IV SOLN
INTRAVENOUS | Status: DC | PRN
  Administered 2017-05-25: 500 [IU]

## 2017-05-25 MED ORDER — LIDOCAINE 2% (20 MG/ML) 5 ML SYRINGE
INTRAMUSCULAR | Status: DC | PRN
  Administered 2017-05-25: 100 mg via INTRAVENOUS

## 2017-05-25 MED ORDER — DEXAMETHASONE SODIUM PHOSPHATE 10 MG/ML IJ SOLN
INTRAMUSCULAR | Status: AC
  Filled 2017-05-25: qty 1

## 2017-05-25 MED ORDER — PROPOFOL 10 MG/ML IV BOLUS
INTRAVENOUS | Status: AC
  Filled 2017-05-25: qty 20

## 2017-05-25 MED ORDER — HYDROMORPHONE HCL-NACL 0.5-0.9 MG/ML-% IV SOSY: 0.2500 mg | PREFILLED_SYRINGE | INTRAVENOUS | Status: DC | PRN

## 2017-05-25 MED ORDER — ACETAMINOPHEN 500 MG PO TABS
1000.0000 mg | ORAL_TABLET | ORAL | Status: AC
  Administered 2017-05-25: 1000 mg via ORAL
  Filled 2017-05-25: qty 2

## 2017-05-25 MED ORDER — LACTATED RINGERS IV SOLN
INTRAVENOUS | Status: DC
  Administered 2017-05-25 (×3): via INTRAVENOUS

## 2017-05-25 MED ORDER — MIDAZOLAM HCL 5 MG/5ML IJ SOLN
INTRAMUSCULAR | Status: DC | PRN
  Administered 2017-05-25: 2 mg via INTRAVENOUS

## 2017-05-25 MED ORDER — PROPOFOL 10 MG/ML IV BOLUS
INTRAVENOUS | Status: DC | PRN
  Administered 2017-05-25: 200 mg via INTRAVENOUS

## 2017-05-25 MED ORDER — LIDOCAINE 2% (20 MG/ML) 5 ML SYRINGE
INTRAMUSCULAR | Status: AC
  Filled 2017-05-25: qty 5

## 2017-05-25 MED ORDER — EPHEDRINE 5 MG/ML INJ
INTRAVENOUS | Status: AC
  Filled 2017-05-25: qty 10

## 2017-05-25 MED ORDER — 0.9 % SODIUM CHLORIDE (POUR BTL) OPTIME
TOPICAL | Status: DC | PRN
  Administered 2017-05-25: 1000 mL

## 2017-05-25 MED ORDER — MEPERIDINE HCL 50 MG/ML IJ SOLN: 6.2500 mg | INTRAMUSCULAR | Status: DC | PRN

## 2017-05-25 MED ORDER — FENTANYL CITRATE (PF) 250 MCG/5ML IJ SOLN
INTRAMUSCULAR | Status: AC
  Filled 2017-05-25: qty 5

## 2017-05-25 MED ORDER — EPHEDRINE SULFATE-NACL 50-0.9 MG/10ML-% IV SOSY
PREFILLED_SYRINGE | INTRAVENOUS | Status: DC | PRN
  Administered 2017-05-25 (×5): 5 mg via INTRAVENOUS
  Administered 2017-05-25: 10 mg via INTRAVENOUS
  Administered 2017-05-25 (×3): 5 mg via INTRAVENOUS

## 2017-05-25 MED ORDER — FENTANYL CITRATE (PF) 100 MCG/2ML IJ SOLN
INTRAMUSCULAR | Status: AC
  Filled 2017-05-25: qty 2

## 2017-05-25 MED ORDER — HEPARIN SOD (PORK) LOCK FLUSH 100 UNIT/ML IV SOLN
INTRAVENOUS | Status: AC
  Filled 2017-05-25: qty 5

## 2017-05-25 MED ORDER — PHENYLEPHRINE 40 MCG/ML (10ML) SYRINGE FOR IV PUSH (FOR BLOOD PRESSURE SUPPORT)
PREFILLED_SYRINGE | INTRAVENOUS | Status: AC
  Filled 2017-05-25: qty 10

## 2017-05-25 MED ORDER — MIDAZOLAM HCL 2 MG/2ML IJ SOLN
INTRAMUSCULAR | Status: AC
  Filled 2017-05-25: qty 2

## 2017-05-25 MED ORDER — DEXAMETHASONE SODIUM PHOSPHATE 10 MG/ML IJ SOLN
INTRAMUSCULAR | Status: DC | PRN
  Administered 2017-05-25: 5 mg via INTRAVENOUS

## 2017-05-25 MED ORDER — BUPIVACAINE-EPINEPHRINE (PF) 0.25% -1:200000 IJ SOLN
INTRAMUSCULAR | Status: AC
  Filled 2017-05-25: qty 30

## 2017-05-25 MED ORDER — BUPIVACAINE-EPINEPHRINE (PF) 0.25% -1:200000 IJ SOLN
INTRAMUSCULAR | Status: DC | PRN
  Administered 2017-05-25: 13 mL

## 2017-05-25 MED ORDER — SODIUM CHLORIDE 0.9 % IV SOLN
Freq: Once | INTRAVENOUS | Status: AC
  Administered 2017-05-25: 12:00:00 500 mL
  Filled 2017-05-25: qty 1.2

## 2017-05-25 MED ORDER — GABAPENTIN 300 MG PO CAPS
300.0000 mg | ORAL_CAPSULE | ORAL | Status: AC
  Administered 2017-05-25: 300 mg via ORAL
  Filled 2017-05-25: qty 1

## 2017-05-25 MED ORDER — ONDANSETRON HCL 4 MG/2ML IJ SOLN
INTRAMUSCULAR | Status: DC | PRN
  Administered 2017-05-25: 4 mg via INTRAVENOUS

## 2017-05-25 MED ORDER — ONDANSETRON HCL 4 MG/2ML IJ SOLN: 4.0000 mg | Freq: Once | INTRAMUSCULAR | Status: DC | PRN

## 2017-05-25 MED ORDER — SUCCINYLCHOLINE CHLORIDE 200 MG/10ML IV SOSY
PREFILLED_SYRINGE | INTRAVENOUS | Status: AC
  Filled 2017-05-25: qty 10

## 2017-05-25 MED ORDER — ROCURONIUM BROMIDE 50 MG/5ML IV SOSY
PREFILLED_SYRINGE | INTRAVENOUS | Status: AC
  Filled 2017-05-25: qty 5

## 2017-05-25 MED ORDER — FENTANYL CITRATE (PF) 100 MCG/2ML IJ SOLN
INTRAMUSCULAR | Status: DC | PRN
  Administered 2017-05-25 (×2): 100 ug via INTRAVENOUS

## 2017-05-25 MED ORDER — EPHEDRINE 5 MG/ML INJ
INTRAVENOUS | Status: AC
  Filled 2017-05-25: qty 20

## 2017-05-25 MED ORDER — ONDANSETRON HCL 4 MG/2ML IJ SOLN
INTRAMUSCULAR | Status: AC
  Filled 2017-05-25: qty 2

## 2017-05-25 SURGICAL SUPPLY — 34 items
ADH SKN CLS APL DERMABOND .7 (GAUZE/BANDAGES/DRESSINGS) ×1
APL SKNCLS STERI-STRIP NONHPOA (GAUZE/BANDAGES/DRESSINGS) ×1
BAG DECANTER FOR FLEXI CONT (MISCELLANEOUS) ×3 IMPLANT
BENZOIN TINCTURE PRP APPL 2/3 (GAUZE/BANDAGES/DRESSINGS) ×2 IMPLANT
BLADE SURG 15 STRL LF DISP TIS (BLADE) ×1 IMPLANT
BLADE SURG 15 STRL SS (BLADE) ×3
CHLORAPREP W/TINT 26ML (MISCELLANEOUS) ×3 IMPLANT
CLOSURE STERI-STRIP 1/4X4 (GAUZE/BANDAGES/DRESSINGS) ×2 IMPLANT
COVER PROBE U/S 5X48 (MISCELLANEOUS) IMPLANT
COVER SURGICAL LIGHT HANDLE (MISCELLANEOUS) ×3 IMPLANT
DECANTER SPIKE VIAL GLASS SM (MISCELLANEOUS) ×3 IMPLANT
DERMABOND ADVANCED (GAUZE/BANDAGES/DRESSINGS) ×2
DERMABOND ADVANCED .7 DNX12 (GAUZE/BANDAGES/DRESSINGS) IMPLANT
DRAPE C-ARM 42X120 X-RAY (DRAPES) ×3 IMPLANT
DRAPE LAPAROSCOPIC ABDOMINAL (DRAPES) ×3 IMPLANT
DRSG TEGADERM 4X4.75 (GAUZE/BANDAGES/DRESSINGS) ×2 IMPLANT
ELECT PENCIL ROCKER SW 15FT (MISCELLANEOUS) ×3 IMPLANT
ELECT REM PT RETURN 15FT ADLT (MISCELLANEOUS) ×3 IMPLANT
GAUZE SPONGE 2X2 8PLY STRL LF (GAUZE/BANDAGES/DRESSINGS) IMPLANT
GAUZE SPONGE 4X4 16PLY XRAY LF (GAUZE/BANDAGES/DRESSINGS) ×3 IMPLANT
GLOVE SURG SIGNA 7.5 PF LTX (GLOVE) ×3 IMPLANT
GOWN STRL REUS W/TWL XL LVL3 (GOWN DISPOSABLE) ×6 IMPLANT
KIT BASIN OR (CUSTOM PROCEDURE TRAY) ×3 IMPLANT
KIT PORT POWER 8FR ISP CVUE (Miscellaneous) ×2 IMPLANT
NDL HYPO 25X1 1.5 SAFETY (NEEDLE) ×1 IMPLANT
NEEDLE HYPO 25X1 1.5 SAFETY (NEEDLE) ×3 IMPLANT
PACK BASIC VI WITH GOWN DISP (CUSTOM PROCEDURE TRAY) ×3 IMPLANT
SPONGE GAUZE 2X2 STER 10/PKG (GAUZE/BANDAGES/DRESSINGS) ×2
SUT MNCRL AB 4-0 PS2 18 (SUTURE) ×3 IMPLANT
SUT VIC AB 3-0 SH 18 (SUTURE) ×3 IMPLANT
SYR 10ML ECCENTRIC (SYRINGE) ×3 IMPLANT
SYR CONTROL 10ML LL (SYRINGE) ×3 IMPLANT
TOWEL OR 17X26 10 PK STRL BLUE (TOWEL DISPOSABLE) ×3 IMPLANT
TOWEL OR NON WOVEN STRL DISP B (DISPOSABLE) ×3 IMPLANT

## 2017-05-25 NOTE — Anesthesia Procedure Notes (Signed)
Procedure Name: LMA Insertion Date/Time: 05/25/2017 11:37 AM Performed by: Jennife Zaucha, Virgel Gess Pre-anesthesia Checklist: Patient identified, Emergency Drugs available, Suction available, Patient being monitored and Timeout performed Patient Re-evaluated:Patient Re-evaluated prior to induction Oxygen Delivery Method: Circle system utilized Preoxygenation: Pre-oxygenation with 100% oxygen Induction Type: IV induction Ventilation: Mask ventilation without difficulty LMA: LMA inserted LMA Size: 4.0 Tube size: 4.0 mm Number of attempts: 1 Placement Confirmation: positive ETCO2 and breath sounds checked- equal and bilateral Tube secured with: Tape Dental Injury: Teeth and Oropharynx as per pre-operative assessment

## 2017-05-25 NOTE — Anesthesia Postprocedure Evaluation (Signed)
Anesthesia Post Note  Patient: Renee Harris  Procedure(s) Performed: Procedure(s) (LRB): INSERTION PORT-A-CATH (N/A)     Patient location during evaluation: PACU Anesthesia Type: General Level of consciousness: awake and alert Pain management: pain level controlled Vital Signs Assessment: post-procedure vital signs reviewed and stable Respiratory status: spontaneous breathing, nonlabored ventilation, respiratory function stable and patient connected to nasal cannula oxygen Cardiovascular status: blood pressure returned to baseline and stable Postop Assessment: no signs of nausea or vomiting Anesthetic complications: no    Last Vitals:  Vitals:   05/25/17 1327 05/25/17 1338  BP: (!) 160/86 130/72  Pulse: 81 86  Resp: 16 16  Temp: 36.6 C 36.8 C  SpO2: 96% 100%    Last Pain:  Vitals:   05/25/17 1059  TempSrc:   PainSc: 0-No pain                 Chantrell Apsey DAVID

## 2017-05-25 NOTE — Discharge Instructions (Signed)
General Anesthesia, Adult, Care After These instructions provide you with information about caring for yourself after your procedure. Your health care provider may also give you more specific instructions. Your treatment has been planned according to current medical practices, but problems sometimes occur. Call your health care provider if you have any problems or questions after your procedure. What can I expect after the procedure? After the procedure, it is common to have:  Vomiting.  A sore throat.  Mental slowness.  It is common to feel:  Nauseous.  Cold or shivery.  Sleepy.  Tired.  Sore or achy, even in parts of your body where you did not have surgery.  Follow these instructions at home: For at least 24 hours after the procedure:  Do not: ? Participate in activities where you could fall or become injured. ? Drive. ? Use heavy machinery. ? Drink alcohol. ? Take sleeping pills or medicines that cause drowsiness. ? Make important decisions or sign legal documents. ? Take care of children on your own.  Rest. Eating and drinking  If you vomit, drink water, juice, or soup when you can drink without vomiting.  Drink enough fluid to keep your urine clear or pale yellow.  Make sure you have little or no nausea before eating solid foods.  Follow the diet recommended by your health care provider. General instructions  Have a responsible adult stay with you until you are awake and alert.  Return to your normal activities as told by your health care provider. Ask your health care provider what activities are safe for you.  Take over-the-counter and prescription medicines only as told by your health care provider.  If you smoke, do not smoke without supervision.  Keep all follow-up visits as told by your health care provider. This is important. Contact a health care provider if:  You continue to have nausea or vomiting at home, and medicines are not helpful.  You  cannot drink fluids or start eating again.  You cannot urinate after 8-12 hours.  You develop a skin rash.  You have fever.  You have increasing redness at the site of your procedure. Get help right away if:  You have difficulty breathing.  You have chest pain.  You have unexpected bleeding.  You feel that you are having a life-threatening or urgent problem. This information is not intended to replace advice given to you by your health care provider. Make sure you discuss any questions you have with your health care provider. Document Released: 01/05/2001 Document Revised: 03/03/2016 Document Reviewed: 09/13/2015 Elsevier Interactive Patient Education  2018 [place] TO PATIENT  Activity:  Driving - May drive in a day or two, if doing well.   Lifting - No lifting more that 15 pounds for 5 days, then no limit  Wound Care:   Leave incision dry for two days, then may shower.         No public water (pool, lake, ocean) for 3 weeks.  Diet:  As tolerated  Follow up appointment:  Call Dr. Pollie Friar office [facility]) at [phone] for an appointment in 4 weeks.  Medications and dosages:  Resume your home medications.  You have a prescription for:  vicodin  Call Dr. Lucia Gaskins or his office  [phone]) if you have:  Temperature greater than 100.4,  Persistent nausea and vomiting,  Severe uncontrolled pain,  Redness, tenderness, or signs of infection (pain, swelling, redness, odor or green/yellow discharge around the  site),  Difficulty breathing, headache or visual disturbances,  Any other questions or concerns you may have after discharge.  In an emergency, call 911 or go to an Emergency Department at a nearby hospital.

## 2017-05-25 NOTE — Transfer of Care (Signed)
Immediate Anesthesia Transfer of Care Note  Patient: Renee Harris  Procedure(s) Performed: Procedure(s): INSERTION PORT-A-CATH (N/A)  Patient Location: PACU  Anesthesia Type:General  Level of Consciousness:  sedated, patient cooperative and responds to stimulation  Airway & Oxygen Therapy:Patient Spontanous Breathing and Patient connected to face mask oxgen  Post-op Assessment:  Report given to PACU RN and Post -op Vital signs reviewed and stable  Post vital signs:  Reviewed and stable  Last Vitals:  Vitals:   05/25/17 1035 05/25/17 1232  BP: (!) 155/89 (!) 153/86  Pulse: 74 92  Resp: 20 16  Temp: 36.4 C (P) 36.6 C  SpO2: 100% (P) 169%    Complications: No apparent anesthesia complications

## 2017-05-25 NOTE — Anesthesia Preprocedure Evaluation (Signed)
Anesthesia Evaluation  Patient identified by MRN, date of birth, ID band Patient awake    Reviewed: Allergy & Precautions, NPO status , Patient's Chart, lab work & pertinent test results  Airway Mallampati: II  TM Distance: >3 FB Neck ROM: Full    Dental   Pulmonary asthma , sleep apnea ,    Pulmonary exam normal        Cardiovascular hypertension, Pt. on medications Normal cardiovascular exam     Neuro/Psych    GI/Hepatic   Endo/Other  diabetes, Type 2, Oral Hypoglycemic Agents  Renal/GU      Musculoskeletal   Abdominal   Peds  Hematology   Anesthesia Other Findings   Reproductive/Obstetrics                             Anesthesia Physical Anesthesia Plan  ASA: III  Anesthesia Plan: General   Post-op Pain Management:    Induction: Intravenous  PONV Risk Score and Plan: 3 and Ondansetron, Dexamethasone, Midazolam, Propofol infusion and Treatment may vary due to age or medical condition  Airway Management Planned: LMA  Additional Equipment:   Intra-op Plan:   Post-operative Plan: Extubation in OR  Informed Consent: I have reviewed the patients History and Physical, chart, labs and discussed the procedure including the risks, benefits and alternatives for the proposed anesthesia with the patient or authorized representative who has indicated his/her understanding and acceptance.     Plan Discussed with: CRNA and Surgeon  Anesthesia Plan Comments:         Anesthesia Quick Evaluation

## 2017-05-25 NOTE — Op Note (Signed)
05/25/2017  12:28 PM  PATIENT:  Renee Harris, 56 y.o., female MRN: [id number] DOB: [birth year]  PREOP DIAGNOSIS:  right breast cancer, for chemotherapy  POSTOP DIAGNOSIS:   right breast cancer, for chemotherapy  PROCEDURE:   Procedure(s):  Left subclavian power port insertion  SURGEON:   Alphonsa Overall, M.D.  ANESTHESIA:   general  Anesthesiologist: Lillia Abed, MD CRNA: Lavina Hamman, CRNA  General  EBL:  minimal  ml  COUNTS CORRECT:  YES  INDICATIONS FOR PROCEDURE:  [name] is a 56 y.o. (DOB: January 12, 1961) white female whose primary care physician is Sharilyn Sites, MD and comes for power port placement for the treatment of right breast cancer.  She has a Her2Neu positive breast cancer with a positive axillary node - so she is to get neoadjuvant chemotx.  Dr. Lindi Adie is her treating oncologist.   The indications and risks of the surgery were explained to the patient.  The risks include, but are not limited to, infection, bleeding, pneumothorax, nerve injury, and thrombosis of the vein.  OPERATIVE NOTE:  The patient was taken to OR room #1 at [facility].  Anesthesia was provided by Anesthesiologist: Lillia Abed, MD CRNA: Lavina Hamman, CRNA.  At the beginning of the operation, the patient was given 2 gm Ancef, had a roll placed under her back, and had the upper chest/neck prepped with Chloroprep and draped.   A time out was held and the surgery checklist reviewed.   The patient was placed in Trendelenburg position.  The left subclavian vein was accessed with a 16 gauge needle and a guide wire threaded through the needle into the vein.  The position of the wire was checked with fluoroscopy.   I then developed a pocket in the upper inner aspect of the left chest for the port reservoir.  I used the [employer] for venous access.  The reservoir was sewn in place with a 3-0 Vicryl suture.  The reservoir had been flushed with dilute (10 units/cc)  heparin.   I then passed the silastic tubing from the reservoir incision to the subclavian stick site and used the 8 French introducer to pass it into the vein.  The tip of the silastic catheter was position at the junction of the SVC and the right atrium under fluoroscopy.  The silastic catheter was then attached to the port with the bayonet device.     The entire port and tubing were checked with fluoroscopy and then the port was flushed with 4 cc of concentrated heparin (100 units/cc).   The wounds were then closed with 3-0 vicryl subcutaneous sutures and the skin closed with a 4-0 Monocryl suture.  The skin was painted with DermaBond.   The patient was transferred to the recovery room in good condition.  The sponge and needle count were correct at the end of the case.  A CXR is ordered for port placement and pending at the time of this note.  Alphonsa Overall, MD, [facility] Pager: [phone] Office phone:  [phone]

## 2017-05-25 NOTE — Interval H&P Note (Signed)
History and Physical Interval Note:  05/25/2017 11:24 AM  Renee Harris  has presented today for surgery, with the diagnosis of right breast cancer, for chemotherapy  The various methods of treatment have been discussed with the patient and family.  She is here with her husband.  After consideration of risks, benefits and other options for treatment, the patient has consented to  Procedure(s): INSERTION PORT-A-CATH (N/A) as a surgical intervention .  The patient's history has been reviewed, patient examined, no change in status, stable for surgery.  I have reviewed the patient's chart and labs.  Questions were answered to the patient's satisfaction.     Tanay Misuraca H

## 2017-05-25 NOTE — Progress Notes (Signed)
Pt's husband came in with financial concerns.  After seeing that pt goes to [facility] for treatment I gave him Naoma Diener number to contact for additional assistance.  I informed him of the [employer] and explained how that works once her balance reaches $5,000 as well as the [employer] card.  I gave him an application to apply to the [place] since they assist pt's that live in [place].  He was very Patent attorney.

## 2017-05-26 ENCOUNTER — Encounter (HOSPITAL_COMMUNITY)
Admission: RE | Admit: 2017-05-26 | Discharge: 2017-05-26 | Disposition: A | Discharge status: Home / Self Care | Payer: 59 | Source: Ambulatory Visit | Attending: Hematology and Oncology | Admitting: Hematology and Oncology

## 2017-05-26 ENCOUNTER — Encounter (HOSPITAL_COMMUNITY): Payer: Self-pay

## 2017-05-26 DIAGNOSIS — C50211 Malignant neoplasm of upper-inner quadrant of right female breast: Secondary | ICD-10-CM | POA: Insufficient documentation

## 2017-05-26 DIAGNOSIS — Z170 Estrogen receptor positive status [ER+]: Secondary | ICD-10-CM | POA: Diagnosis not present

## 2017-05-26 MED ORDER — TECHNETIUM TC 99M MEDRONATE IV KIT
25.0000 | PACK | Freq: Once | INTRAVENOUS | Status: AC | PRN
  Administered 2017-05-26: 25 via INTRAVENOUS

## 2017-05-27 ENCOUNTER — Ambulatory Visit (HOSPITAL_COMMUNITY)
Admission: RE | Admit: 2017-05-27 | Discharge: 2017-05-27 | Disposition: A | Discharge status: Home / Self Care | Payer: 59 | Source: Ambulatory Visit | Attending: Hematology and Oncology | Admitting: Hematology and Oncology

## 2017-05-27 ENCOUNTER — Other Ambulatory Visit (HOSPITAL_COMMUNITY): Payer: Self-pay | Admitting: Oncology

## 2017-05-27 DIAGNOSIS — R1907 Generalized intra-abdominal and pelvic swelling, mass and lump: Secondary | ICD-10-CM | POA: Diagnosis not present

## 2017-05-27 DIAGNOSIS — R918 Other nonspecific abnormal finding of lung field: Secondary | ICD-10-CM | POA: Diagnosis not present

## 2017-05-27 DIAGNOSIS — C773 Secondary and unspecified malignant neoplasm of axilla and upper limb lymph nodes: Secondary | ICD-10-CM | POA: Insufficient documentation

## 2017-05-27 DIAGNOSIS — K439 Ventral hernia without obstruction or gangrene: Secondary | ICD-10-CM | POA: Diagnosis not present

## 2017-05-27 DIAGNOSIS — Z170 Estrogen receptor positive status [ER+]: Secondary | ICD-10-CM | POA: Diagnosis present

## 2017-05-27 DIAGNOSIS — K760 Fatty (change of) liver, not elsewhere classified: Secondary | ICD-10-CM | POA: Insufficient documentation

## 2017-05-27 DIAGNOSIS — N6489 Other specified disorders of breast: Secondary | ICD-10-CM | POA: Insufficient documentation

## 2017-05-27 DIAGNOSIS — C50211 Malignant neoplasm of upper-inner quadrant of right female breast: Secondary | ICD-10-CM | POA: Insufficient documentation

## 2017-05-27 MED ORDER — IOPAMIDOL (ISOVUE-300) INJECTION 61%
100.0000 mL | Freq: Once | INTRAVENOUS | Status: AC | PRN
  Administered 2017-05-27: 100 mL via INTRAVENOUS

## 2017-05-28 ENCOUNTER — Encounter: Payer: Self-pay | Admitting: Pharmacist

## 2017-05-29 ENCOUNTER — Encounter (HOSPITAL_COMMUNITY): Payer: 59 | Attending: Adult Health

## 2017-05-29 ENCOUNTER — Encounter (HOSPITAL_COMMUNITY): Payer: Self-pay

## 2017-05-29 VITALS — BP 131/69 | HR 83 | Temp 98.6°F | Resp 59 | Wt 266.7 lb

## 2017-05-29 DIAGNOSIS — Z79899 Other long term (current) drug therapy: Secondary | ICD-10-CM | POA: Diagnosis not present

## 2017-05-29 DIAGNOSIS — Z9221 Personal history of antineoplastic chemotherapy: Secondary | ICD-10-CM | POA: Diagnosis not present

## 2017-05-29 DIAGNOSIS — Z9049 Acquired absence of other specified parts of digestive tract: Secondary | ICD-10-CM | POA: Insufficient documentation

## 2017-05-29 DIAGNOSIS — R12 Heartburn: Secondary | ICD-10-CM

## 2017-05-29 DIAGNOSIS — Z5112 Encounter for antineoplastic immunotherapy: Secondary | ICD-10-CM | POA: Diagnosis not present

## 2017-05-29 DIAGNOSIS — C50211 Malignant neoplasm of upper-inner quadrant of right female breast: Secondary | ICD-10-CM | POA: Diagnosis not present

## 2017-05-29 DIAGNOSIS — E119 Type 2 diabetes mellitus without complications: Secondary | ICD-10-CM | POA: Insufficient documentation

## 2017-05-29 DIAGNOSIS — C773 Secondary and unspecified malignant neoplasm of axilla and upper limb lymph nodes: Secondary | ICD-10-CM

## 2017-05-29 DIAGNOSIS — R197 Diarrhea, unspecified: Secondary | ICD-10-CM | POA: Diagnosis not present

## 2017-05-29 DIAGNOSIS — G4700 Insomnia, unspecified: Secondary | ICD-10-CM | POA: Diagnosis not present

## 2017-05-29 DIAGNOSIS — Z87442 Personal history of urinary calculi: Secondary | ICD-10-CM | POA: Insufficient documentation

## 2017-05-29 DIAGNOSIS — R634 Abnormal weight loss: Secondary | ICD-10-CM | POA: Insufficient documentation

## 2017-05-29 DIAGNOSIS — Z7984 Long term (current) use of oral hypoglycemic drugs: Secondary | ICD-10-CM | POA: Insufficient documentation

## 2017-05-29 DIAGNOSIS — E785 Hyperlipidemia, unspecified: Secondary | ICD-10-CM | POA: Insufficient documentation

## 2017-05-29 DIAGNOSIS — Z5111 Encounter for antineoplastic chemotherapy: Secondary | ICD-10-CM

## 2017-05-29 DIAGNOSIS — Z801 Family history of malignant neoplasm of trachea, bronchus and lung: Secondary | ICD-10-CM | POA: Diagnosis not present

## 2017-05-29 DIAGNOSIS — I10 Essential (primary) hypertension: Secondary | ICD-10-CM | POA: Diagnosis not present

## 2017-05-29 DIAGNOSIS — Z170 Estrogen receptor positive status [ER+]: Secondary | ICD-10-CM | POA: Diagnosis not present

## 2017-05-29 DIAGNOSIS — Z90710 Acquired absence of both cervix and uterus: Secondary | ICD-10-CM | POA: Insufficient documentation

## 2017-05-29 LAB — CBC WITH DIFFERENTIAL/PLATELET
Basophils Absolute: 0 10*3/uL (ref 0.0–0.1)
Basophils Relative: 0 %
EOS ABS: 0.3 10*3/uL (ref 0.0–0.7)
EOS PCT: 4 %
HCT: 40.6 % (ref 36.0–46.0)
Hemoglobin: 13.5 g/dL (ref 12.0–15.0)
LYMPHS ABS: 2 10*3/uL (ref 0.7–4.0)
Lymphocytes Relative: 29 %
MCH: 28.5 pg (ref 26.0–34.0)
MCHC: 33.3 g/dL (ref 30.0–36.0)
MCV: 85.7 fL (ref 78.0–100.0)
Monocytes Absolute: 0.4 10*3/uL (ref 0.1–1.0)
Monocytes Relative: 6 %
Neutro Abs: 4.3 10*3/uL (ref 1.7–7.7)
Neutrophils Relative %: 61 %
PLATELETS: 206 10*3/uL (ref 150–400)
RBC: 4.74 MIL/uL (ref 3.87–5.11)
RDW: 13.4 % (ref 11.5–15.5)
WBC: 7 10*3/uL (ref 4.0–10.5)

## 2017-05-29 LAB — COMPREHENSIVE METABOLIC PANEL
ALT: 31 U/L (ref 14–54)
ANION GAP: 9 (ref 5–15)
AST: 26 U/L (ref 15–41)
Albumin: 3.8 g/dL (ref 3.5–5.0)
Alkaline Phosphatase: 58 U/L (ref 38–126)
BUN: 23 mg/dL — ABNORMAL HIGH (ref 6–20)
CHLORIDE: 102 mmol/L (ref 101–111)
CO2: 27 mmol/L (ref 22–32)
Calcium: 10.3 mg/dL (ref 8.9–10.3)
Creatinine, Ser: 0.92 mg/dL (ref 0.44–1.00)
GFR calc non Af Amer: >60 mL/min (ref >60)
Glucose, Bld: 284 mg/dL — ABNORMAL HIGH (ref 65–99)
Potassium: 3.7 mmol/L (ref 3.5–5.1)
SODIUM: 138 mmol/L (ref 135–145)
Total Bilirubin: 0.8 mg/dL (ref 0.3–1.2)
Total Protein: 7.1 g/dL (ref 6.5–8.1)

## 2017-05-29 MED ORDER — DEXAMETHASONE SODIUM PHOSPHATE 10 MG/ML IJ SOLN
10.0000 mg | Freq: Once | INTRAMUSCULAR | Status: AC
  Administered 2017-05-29: 10 mg via INTRAVENOUS
  Filled 2017-05-29: qty 1

## 2017-05-29 MED ORDER — TRASTUZUMAB CHEMO 150 MG IV SOLR
8.0000 mg/kg | Freq: Once | INTRAVENOUS | Status: AC
  Administered 2017-05-29: 966 mg via INTRAVENOUS
  Filled 2017-05-29: qty 46

## 2017-05-29 MED ORDER — SODIUM CHLORIDE 0.9 % IV SOLN
Freq: Once | INTRAVENOUS | Status: AC
  Administered 2017-05-29: 10:00:00 via INTRAVENOUS

## 2017-05-29 MED ORDER — PALONOSETRON HCL INJECTION 0.25 MG/5ML
0.2500 mg | Freq: Once | INTRAVENOUS | Status: AC
  Administered 2017-05-29: 0.25 mg via INTRAVENOUS
  Filled 2017-05-29: qty 5

## 2017-05-29 MED ORDER — ACETAMINOPHEN 325 MG PO TABS
650.0000 mg | ORAL_TABLET | Freq: Once | ORAL | Status: AC
  Administered 2017-05-29: 650 mg via ORAL

## 2017-05-29 MED ORDER — SODIUM CHLORIDE 0.9 % IV SOLN
840.0000 mg | Freq: Once | INTRAVENOUS | Status: AC
  Administered 2017-05-29: 840 mg via INTRAVENOUS
  Filled 2017-05-29: qty 28

## 2017-05-29 MED ORDER — GI COCKTAIL ~~LOC~~
30.0000 mL | Freq: Once | ORAL | Status: AC
  Administered 2017-05-29: 30 mL via ORAL
  Filled 2017-05-29: qty 30

## 2017-05-29 MED ORDER — SODIUM CHLORIDE 0.9% FLUSH
10.0000 mL | INTRAVENOUS | Status: DC | PRN
Start: 2017-05-29 — End: 2017-06-01
  Administered 2017-05-29: 10 mL
  Filled 2017-05-29: qty 10

## 2017-05-29 MED ORDER — ACETAMINOPHEN 325 MG PO TABS
ORAL_TABLET | ORAL | Status: AC
Start: 2017-05-29 — End: ?
  Filled 2017-05-29: qty 2

## 2017-05-29 MED ORDER — PEGFILGRASTIM 6 MG/0.6ML ~~LOC~~ PSKT
6.0000 mg | PREFILLED_SYRINGE | Freq: Once | SUBCUTANEOUS | Status: AC
  Administered 2017-05-29: 6 mg via SUBCUTANEOUS
  Filled 2017-05-29: qty 0.6

## 2017-05-29 MED ORDER — DIPHENHYDRAMINE HCL 25 MG PO CAPS
50.0000 mg | ORAL_CAPSULE | Freq: Once | ORAL | Status: AC
  Administered 2017-05-29: 50 mg via ORAL

## 2017-05-29 MED ORDER — DIPHENHYDRAMINE HCL 25 MG PO CAPS
ORAL_CAPSULE | ORAL | Status: AC
  Filled 2017-05-29: qty 2

## 2017-05-29 MED ORDER — DOCETAXEL CHEMO INJECTION 160 MG/16ML
75.0000 mg/m2 | Freq: Once | INTRAVENOUS | Status: AC
  Administered 2017-05-29: 170 mg via INTRAVENOUS
  Filled 2017-05-29: qty 17

## 2017-05-29 MED ORDER — PEGFILGRASTIM 6 MG/0.6ML ~~LOC~~ PSKT
PREFILLED_SYRINGE | SUBCUTANEOUS | Status: AC
  Filled 2017-05-29: qty 0.6

## 2017-05-29 MED ORDER — HEPARIN SOD (PORK) LOCK FLUSH 100 UNIT/ML IV SOLN
500.0000 [IU] | Freq: Once | INTRAVENOUS | Status: AC | PRN
  Administered 2017-05-29: 500 [IU]
  Filled 2017-05-29 (×2): qty 5

## 2017-05-29 MED ORDER — SODIUM CHLORIDE 0.9 % IV SOLN
900.0000 mg | Freq: Once | INTRAVENOUS | Status: AC
  Administered 2017-05-29: 900 mg via INTRAVENOUS
  Filled 2017-05-29: qty 90

## 2017-05-29 NOTE — Progress Notes (Signed)
Patient presented today for first cycle of chemotherapy. Consent obtained and labs drawn from port.  Labs within normal range. 2D-Echo done and normal. Proceed with treatment per Renee Craze NP  Chemotherapy given today without any problems. Patient has tolerated it well. Vitals stable and discharged home from clinic ambulatory. Husband at her side. Renee KitchenJeanella Harris arrived today for [facility] neulasta on body injector. See MAR for administration details. Injector in place and engaged with green light indicator on flashing. Tolerated application with out problems.  Patient and husband understand information given about the onpro device.   Follow up as scheduled.

## 2017-05-29 NOTE — Patient Instructions (Signed)
[facility] Discharge Instructions for Patients Receiving Chemotherapy   Beginning January 23rd 2017 lab work for the Cancer Center will be done in the  Main lab at [place] on 1st floor. If you have a lab appointment with the Cancer Center please come in thru the  Main Entrance and check in at the main information desk   Today you received the following chemotherapy agents   To help prevent nausea and vomiting after your treatment, we encourage you to take your nausea medication     If you develop nausea and vomiting, or diarrhea that is not controlled by your medication, call the clinic.  The clinic phone number is [phone]. Office hours are Monday-Friday 8:30am-5:00pm.  BELOW ARE SYMPTOMS THAT SHOULD BE REPORTED IMMEDIATELY:  *FEVER GREATER THAN 101.0 F  *CHILLS WITH OR WITHOUT FEVER  NAUSEA AND VOMITING THAT IS NOT CONTROLLED WITH YOUR NAUSEA MEDICATION  *UNUSUAL SHORTNESS OF BREATH  *UNUSUAL BRUISING OR BLEEDING  TENDERNESS IN MOUTH AND THROAT WITH OR WITHOUT PRESENCE OF ULCERS  *URINARY PROBLEMS  *BOWEL PROBLEMS  UNUSUAL RASH Items with * indicate a potential emergency and should be followed up as soon as possible. If you have an emergency after office hours please contact your primary care physician or go to the nearest emergency department.  Please call the clinic during office hours if you have any questions or concerns.   You may also contact the Patient Navigator at [phone] should you have any questions or need assistance in obtaining follow up care.      Resources For Cancer Patients and their Caregivers ? American Cancer Society: Can assist with transportation, wigs, general needs, runs Look Good Feel Better.        1-888-227-6333 ? Cancer Care: Provides financial assistance, online support groups, medication/co-pay assistance.  1-800-813-HOPE (4673) ? [facility] Assists [place] cancer  patients and their families through emotional , educational and financial support.  336-427-4357 ? [place] DSS Where to apply for food stamps, Medicaid and utility assistance. 336-342-1394 ? [facility]: Transportation to medical appointments. 336-347-2287 ? Social Security Administration: May apply for disability if have a Stage IV cancer. 336-342-7796 1-800-772-1213 ? [place] Aging, Disability and Transit Services: Assists with nutrition, care and transit needs. 336-349-2343         

## 2017-05-31 ENCOUNTER — Encounter (HOSPITAL_COMMUNITY): Payer: Self-pay | Admitting: Surgery

## 2017-06-01 ENCOUNTER — Telehealth (HOSPITAL_COMMUNITY): Payer: Self-pay

## 2017-06-01 ENCOUNTER — Encounter (HOSPITAL_COMMUNITY): Payer: Self-pay | Admitting: Surgery

## 2017-06-01 NOTE — Telephone Encounter (Signed)
24 hour follow up - patient stated she is doing pretty good. Having a little diarrhea last night and this morning. Patient instructed to follow diarrhea sheet, she stated she has some imodium, she will take it this morning. Encouraged her to eat and drink her fluids throught out the day. Patient states she understood.

## 2017-06-10 ENCOUNTER — Encounter (HOSPITAL_BASED_OUTPATIENT_CLINIC_OR_DEPARTMENT_OTHER): Payer: 59 | Admitting: Adult Health

## 2017-06-10 ENCOUNTER — Encounter (HOSPITAL_COMMUNITY): Payer: 59

## 2017-06-10 ENCOUNTER — Encounter (HOSPITAL_COMMUNITY): Payer: Self-pay | Admitting: Surgery

## 2017-06-10 VITALS — BP 150/98 | HR 94 | Temp 98.1°F | Resp 94 | Wt 252.7 lb

## 2017-06-10 DIAGNOSIS — G4700 Insomnia, unspecified: Secondary | ICD-10-CM | POA: Diagnosis not present

## 2017-06-10 DIAGNOSIS — C50211 Malignant neoplasm of upper-inner quadrant of right female breast: Secondary | ICD-10-CM

## 2017-06-10 DIAGNOSIS — K521 Toxic gastroenteritis and colitis: Secondary | ICD-10-CM

## 2017-06-10 DIAGNOSIS — C773 Secondary and unspecified malignant neoplasm of axilla and upper limb lymph nodes: Secondary | ICD-10-CM

## 2017-06-10 DIAGNOSIS — Z170 Estrogen receptor positive status [ER+]: Principal | ICD-10-CM

## 2017-06-10 DIAGNOSIS — R12 Heartburn: Secondary | ICD-10-CM | POA: Diagnosis not present

## 2017-06-10 DIAGNOSIS — E119 Type 2 diabetes mellitus without complications: Secondary | ICD-10-CM

## 2017-06-10 DIAGNOSIS — T451X5A Adverse effect of antineoplastic and immunosuppressive drugs, initial encounter: Secondary | ICD-10-CM

## 2017-06-10 DIAGNOSIS — E118 Type 2 diabetes mellitus with unspecified complications: Secondary | ICD-10-CM

## 2017-06-10 DIAGNOSIS — R197 Diarrhea, unspecified: Secondary | ICD-10-CM | POA: Diagnosis not present

## 2017-06-10 DIAGNOSIS — R634 Abnormal weight loss: Secondary | ICD-10-CM | POA: Diagnosis not present

## 2017-06-10 LAB — CBC WITH DIFFERENTIAL/PLATELET
BASOS PCT: 0 %
Basophils Absolute: 0 10*3/uL (ref 0.0–0.1)
EOS PCT: 0 %
Eosinophils Absolute: 0 10*3/uL (ref 0.0–0.7)
HCT: 41.1 % (ref 36.0–46.0)
HEMOGLOBIN: 14.2 g/dL (ref 12.0–15.0)
LYMPHS PCT: 14 %
Lymphs Abs: 3.6 10*3/uL (ref 0.7–4.0)
MCH: 28.4 pg (ref 26.0–34.0)
MCHC: 34.5 g/dL (ref 30.0–36.0)
MCV: 82.2 fL (ref 78.0–100.0)
MONO ABS: 1.8 10*3/uL — AB (ref 0.1–1.0)
Monocytes Relative: 7 %
NEUTROS PCT: 79 %
Neutro Abs: 20.5 10*3/uL — ABNORMAL HIGH (ref 1.7–7.7)
Platelets: 124 10*3/uL — ABNORMAL LOW (ref 150–400)
RBC: 5 MIL/uL (ref 3.87–5.11)
RDW: 13.2 % (ref 11.5–15.5)
WBC: 25.9 10*3/uL — ABNORMAL HIGH (ref 4.0–10.5)

## 2017-06-10 LAB — COMPREHENSIVE METABOLIC PANEL
ALK PHOS: 126 U/L (ref 38–126)
ALT: 113 U/L — AB (ref 14–54)
AST: 73 U/L — ABNORMAL HIGH (ref 15–41)
Albumin: 4.3 g/dL (ref 3.5–5.0)
Anion gap: 12 (ref 5–15)
BUN: 15 mg/dL (ref 6–20)
CALCIUM: 11 mg/dL — AB (ref 8.9–10.3)
CO2: 23 mmol/L (ref 22–32)
CREATININE: 1.08 mg/dL — AB (ref 0.44–1.00)
Chloride: 96 mmol/L — ABNORMAL LOW (ref 101–111)
GFR, EST NON AFRICAN AMERICAN: 58 mL/min — AB (ref >60)
Glucose, Bld: 195 mg/dL — ABNORMAL HIGH (ref 65–99)
Potassium: 3.9 mmol/L (ref 3.5–5.1)
Sodium: 131 mmol/L — ABNORMAL LOW (ref 135–145)
Total Bilirubin: 0.5 mg/dL (ref 0.3–1.2)
Total Protein: 7.6 g/dL (ref 6.5–8.1)

## 2017-06-10 MED ORDER — DIPHENOXYLATE-ATROPINE 2.5-0.025 MG PO TABS: 1.0000 | ORAL_TABLET | Freq: Four times a day (QID) | ORAL | 0 refills | Status: DC | PRN

## 2017-06-10 MED ORDER — OMEPRAZOLE 40 MG PO CPDR: 40.0000 mg | DELAYED_RELEASE_CAPSULE | Freq: Every day | ORAL | 3 refills | Status: DC

## 2017-06-11 ENCOUNTER — Encounter (HOSPITAL_COMMUNITY): Payer: Self-pay | Admitting: Surgery

## 2017-06-13 DIAGNOSIS — R634 Abnormal weight loss: Secondary | ICD-10-CM | POA: Insufficient documentation

## 2017-06-13 NOTE — Progress Notes (Signed)
 [facility] [address] [place], [place] [address]   CLINIC:  Medical Oncology/Hematology  PCP:  Golding, John, MD [address] [place] [address] [phone]   REASON FOR VISIT:  Follow-up for Stage IIB invasive ductal carcinoma of (R) breast; ER+/PR+/HER2+  CURRENT THERAPY: Neoadjuvant Taxotere/Carbo/Herceptin/Perjeta every 3 weeks, beginning 05/29/17   BRIEF ONCOLOGIC HISTORY:    Malignant neoplasm of upper-inner quadrant of right breast in female, estrogen receptor positive (HCC)   04/13/2017 Initial Diagnosis    Screening detected right breast mass with calcifications 2.1 cm in size and 3.4 cm in size. Calcifications were fibroadenoma. Right breast biopsy upper inner quadrant 1:00 mass: IDC with DCIS with necrosis, grade 3, ER 100%, PR 70%, Ki-67 60%, HER-2 positive ratio 2.34, T2 N1 stage IB (New AJCC)      04/25/2017 Breast MRI    Right breast upper inner quadrant 2.2 x 1.7 x 2.7 cm mass, abnormal area of clumped non-mass enhancement in the right lateral breast 3.2 x 1.9 x 1 cm, too abnormal lymph nodes right axilla 4.3 and 1.7 cm (biopsy-proven breast cancer)       05/08/2017 Procedure    Right axilla lymph node biopsy: Metastatic carcinoma      05/13/2017 Procedure    Right breast biopsy retroareolar region: Intraductal papilloma         INTERVAL HISTORY:  Renee Harris 56 y.o. female returns for routine follow-up after her first cycle of neoadjuvant chemotherapy.   She was initially seen by Dr. Gudena at [facility]. She elected to have her treatments closer to home, as she lives in [place]. She started TCHP chemotherapy on 05/29/17.    Today, she feels like she is improving, "but it has been a rough week."  Diarrhea and heartburn are her biggest complaints.  She has been eating Tums for the heartburn, which are temporarily helpful.  For the diarrhea, se has been taking periodic Imodium, but admittedly may not be taking enough.  She  took Claritin after chemo to help with Neulasta OnPro-associated bone pain, but this gave her a slight nosebleed; no copious amount of blood loss per her report.  She has not tried any medications for heartburn aside from Tums.   Weight is down ~16 lbs since 05/19/17. She attributes her decreased appetite to taste changes and not feeling like eating.  Nausea was well-controlled.  Energy levels 50%, "but I feel like I'm getting better each day."    She tells me that she sees Dr. Golding, her PCP, for her diabetes management. She is concerned about her blood sugars. She is wondering if we can check a Hgb A1c with her next chemo "so I don't have to go to Dr. Golding's office and be around all of those sick people while I'm getting chemo."  This is not unreasonable; I will add-on Hgb A1c to next cycle and will send results to Dr. Golding for further management.   She doesn't sleep well; takes Ambien. She is asking for a refill today; her PCP has been managing this medication for her.      REVIEW OF SYSTEMS:  Review of Systems  Constitutional: Positive for appetite change, fatigue and unexpected weight change. Negative for chills and fever.  HENT:   Positive for nosebleeds (resolved after 1 occurrence ).   Eyes: Negative.   Respiratory: Negative.   Cardiovascular: Negative.  Negative for chest pain.  Gastrointestinal: Positive for diarrhea. Negative for blood in stool, constipation, nausea and vomiting.         Heartburn   Endocrine: Negative.   Genitourinary: Negative.  Negative for dysuria, hematuria and vaginal bleeding.   Musculoskeletal: Negative.   Skin: Negative.  Negative for rash.  Neurological: Negative.   Hematological: Negative.   Psychiatric/Behavioral: Negative.      PAST MEDICAL/SURGICAL HISTORY:  Past Medical History:  Diagnosis Date  . Asthma   . Blood transfusion    as child  . Diabetes (HCC)    type 2   . Heart murmur    d/t aortic stenosis   . History of kidney  stones   . Hyperlipidemia   . Hypertension   . Normal echocardiogram   . Sleep apnea    Past Surgical History:  Procedure Laterality Date  . ABDOMINAL HYSTERECTOMY  2004  . CHOLECYSTECTOMY  05/30/2011   Procedure: LAPAROSCOPIC CHOLECYSTECTOMY;  Surgeon: Brent C Ziegler;  Location: [facility];  Service: General;  Laterality: N/A;  . 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 KIDNEY STONE SURGERY     2017 2-23  . PORTACATH PLACEMENT N/A 05/25/2017   Procedure: INSERTION PORT-A-CATH;  Surgeon: Newman, David, MD;  Location: [facility];  Service: General;  Laterality: N/A;     SOCIAL HISTORY:  Social History   Social History  . Marital status: Married    Spouse name: N/A  . Number of children: N/A  . Years of education: N/A   Occupational History  . Not on file.   Social History Main Topics  . Smoking status: Never Smoker  . Smokeless tobacco: Never Used  . Alcohol use Yes     Comment: 2 x year  . Drug use: No  . Sexual activity: Yes    Birth control/ protection: Surgical   Other Topics Concern  . Not on file   Social History Narrative  . No narrative on file    FAMILY HISTORY:  Family History  Problem Relation Age of Onset  . Lung cancer Maternal Grandmother   . Anesthesia problems Neg Hx   . Hypotension Neg Hx   . Malignant hyperthermia Neg Hx   . Pseudochol deficiency Neg Hx     CURRENT MEDICATIONS:  Outpatient Encounter Prescriptions as of 06/10/2017  Medication Sig Note  . albuterol (PROVENTIL HFA;VENTOLIN HFA) 108 (90 BASE) MCG/ACT inhaler Inhale 1 puff into the lungs every 6 (six) hours as needed for wheezing or shortness of breath.    . allopurinol (ZYLOPRIM) 300 MG tablet Take 300 mg by mouth at bedtime.    . Black Cohosh 40 MG CAPS Take 40 mg by mouth at bedtime.    . dexamethasone (DECADRON) 4 MG tablet Take 1 tablet (4 mg total) by mouth daily. 1 tab day before chemo and 1 tab day after 05/20/2017: Will start when starts chemtherapy   .  diphenhydrAMINE (BENADRYL) 25 mg capsule Take 25 mg by mouth at bedtime as needed for allergies. For allergies    . diphenoxylate-atropine (LOMOTIL) 2.5-0.025 MG tablet Take 1 tablet by mouth 4 (four) times daily as needed for diarrhea or loose stools.   . escitalopram (LEXAPRO) 20 MG tablet Take 20 mg by mouth at bedtime.    . fenofibrate 160 MG tablet Take 160 mg by mouth at bedtime.    . hydrochlorothiazide (HYDRODIURIL) 12.5 MG tablet Take 12.5 mg by mouth at bedtime.    . HYDROcodone-acetaminophen (NORCO/VICODIN) 5-325 MG tablet Take 1 tablet by mouth every 6 (six) hours as needed for moderate pain.   .   ibuprofen (ADVIL,MOTRIN) 200 MG tablet Take 200 mg by mouth every 8 (eight) hours as needed for headache or mild pain.   Marland Kitchen lidocaine-prilocaine (EMLA) cream Apply to affected area once 05/20/2017: Will start when starts chemtherapy   . losartan (COZAAR) 50 MG tablet Take 50 mg by mouth at bedtime.    . metFORMIN (GLUCOPHAGE) 500 MG tablet Take 500 mg by mouth 2 (two) times daily with a meal.   . Multiple Vitamin (MULTIVITAMIN) tablet Take 1 tablet by mouth daily.   Marland Kitchen omeprazole (PRILOSEC) 40 MG capsule Take 1 capsule (40 mg total) by mouth daily.   . ondansetron (ZOFRAN) 8 MG tablet Take 1 tablet (8 mg total) by mouth 2 (two) times daily as needed for refractory nausea / vomiting. Start on day 3 after chemo. 05/20/2017: Will start when starts chemtherapy  . prochlorperazine (COMPAZINE) 10 MG tablet Take 1 tablet (10 mg total) by mouth every 6 (six) hours as needed (Nausea or vomiting). 05/20/2017: Will start when starts chemtherapy  . zolpidem (AMBIEN) 10 MG tablet Take 1 tablet (10 mg total) by mouth at bedtime as needed for sleep.    No facility-administered encounter medications on file as of 06/10/2017.     ALLERGIES:  Allergies  Allergen Reactions  . Latex Itching  . Pyridostigmine Bromide Other (See Comments)    Makes muscles twitch  . Statins Nausea Only     PHYSICAL EXAM:  ECOG  Performance status: 1-2 - Symptomatic; requires occasional assistance.   Vitals:   06/10/17 1254  BP: (!) 150/98  Pulse: 94  Resp: (!) 94  Temp: 98.1 F (36.7 C)  SpO2: 98%   Filed Weights   06/10/17 1254  Weight: 252 lb 11.2 oz (114.6 kg)    Physical Exam  Constitutional: She is oriented to person, place, and time and well-developed, well-nourished, and in no distress.  HENT:  Head: Normocephalic.  Mouth/Throat: Oropharynx is clear and moist. No oropharyngeal exudate.  Eyes: Pupils are equal, round, and reactive to light. Conjunctivae are normal. No scleral icterus.  Neck: Normal range of motion. Neck supple.  Cardiovascular: Regular rhythm.   Mild tachycardia  Pulmonary/Chest: Effort normal and breath sounds normal. No respiratory distress.  Abdominal: Soft. Bowel sounds are normal. There is no tenderness.  Musculoskeletal: Normal range of motion. She exhibits edema (Trace ankle edema).  Lymphadenopathy:    She has no cervical adenopathy.       Right: No supraclavicular adenopathy present.       Left: No supraclavicular adenopathy present.  Neurological: She is alert and oriented to person, place, and time. No cranial nerve deficit.  Skin: Skin is warm and dry. No rash noted.  Psychiatric: Mood, memory, affect and judgment normal.  Nursing note and vitals reviewed.    LABORATORY DATA:  I have reviewed the labs as listed.  CBC    Component Value Date/Time   WBC 25.9 (H) 06/10/2017 1230   RBC 5.00 06/10/2017 1230   HGB 14.2 06/10/2017 1230   HGB 13.8 04/22/2017 0831   HCT 41.1 06/10/2017 1230   HCT 41.9 04/22/2017 0831   PLT 124 (L) 06/10/2017 1230   PLT 184 04/22/2017 0831   MCV 82.2 06/10/2017 1230   MCV 85.7 04/22/2017 0831   MCH 28.4 06/10/2017 1230   MCHC 34.5 06/10/2017 1230   RDW 13.2 06/10/2017 1230   RDW 13.9 04/22/2017 0831   LYMPHSABS 3.6 06/10/2017 1230   LYMPHSABS 2.1 04/22/2017 0831   MONOABS 1.8 (H) 06/10/2017 1230  MONOABS 0.5 04/22/2017  0831   EOSABS 0.0 06/10/2017 1230   EOSABS 0.3 04/22/2017 0831   BASOSABS 0.0 06/10/2017 1230   BASOSABS 0.0 04/22/2017 0831   CMP Latest Ref Rng & Units 06/10/2017 05/29/2017 05/22/2017  Glucose 65 - 99 mg/dL 195(H) 284(H) 157(H)  BUN 6 - 20 mg/dL 15 23(H) 21(H)  Creatinine 0.44 - 1.00 mg/dL 1.08(H) 0.92 0.86  Sodium 135 - 145 mmol/L 131(L) 138 142  Potassium 3.5 - 5.1 mmol/L 3.9 3.7 4.3  Chloride 101 - 111 mmol/L 96(L) 102 105  CO2 22 - 32 mmol/L 23 27 28  Calcium 8.9 - 10.3 mg/dL 11.0(H) 10.3 10.7(H)  Total Protein 6.5 - 8.1 g/dL 7.6 7.1 -  Total Bilirubin 0.3 - 1.2 mg/dL 0.5 0.8 -  Alkaline Phos 38 - 126 U/L 126 58 -  AST 15 - 41 U/L 73(H) 26 -  ALT 14 - 54 U/L 113(H) 31 -    PENDING LABS:    DIAGNOSTIC IMAGING:  *The following radiologic images and reports have been reviewed independently and agree with below findings.  Mammogram: 04/07/17 CLINICAL DATA:  Screening recall for right breast mass and calcifications. In addition, the patient states she now feels a palpable abnormality in her upper right breast since her recent screening mammogram.  EXAM: 2D DIGITAL DIAGNOSTIC UNILATERAL RIGHT MAMMOGRAM WITH CAD AND ADJUNCT TOMO  RIGHT BREAST ULTRASOUND  COMPARISON:  Screening mammogram dated 03/23/2017.  ACR Breast Density Category b: There are scattered areas of fibroglandular density.  FINDINGS: Cc and MLO tomograms of the right breast as well as spot compression magnification views of the right breast were performed. There is an irregular spiculated mass with associated microcalcifications in the superior right breast measuring approximately 2.1 cm. Spot compression magnification views of the retroareolar right breast demonstrate loosely grouped predominantly coarse calcifications, however some of which are amorphous and suspicious, spanning a distance of approximately 3.4 cm.  Mammographic images were processed with CAD.  Physical examination of the  upper right breast reveals a firm fixed nodule at the approximate 12 o'clock position.  Targeted ultrasound of the right breast was performed demonstrating an irregular hypoechoic mass at 12 o'clock 9 cm from nipple measuring 2 x 1.3 x 2.1 cm. This corresponds well with the mass seen at mammography. No definite lymphadenopathy seen in the right axilla.  IMPRESSION: 1. Highly suspicious right breast mass with associated calcifications measuring 2.1 cm.  2. Additional indeterminate calcifications in the retroareolar right breast.  RECOMMENDATION: 1. Ultrasound-guided biopsy of the mass in the right breast at the 12 o'clock position is recommended.  3. Stereotactic guided biopsy of the calcifications in the retroareolar right breast is recommended.  Biopsies are being scheduled for the patient at the [facility].  I have discussed the findings and recommendations with the patient. Results were also provided in writing at the conclusion of the visit. If applicable, a reminder letter will be sent to the patient regarding the next appointment.  BI-RADS CATEGORY  5: Highly suggestive of malignancy.   Electronically Signed   By: Jennifer  Jarosz M.D.   On: 04/07/2017 13:06   PATHOLOGY:  (R) breast biopsy: 04/13/17     (R) axillary lymph node biopsy: 05/08/17      ASSESSMENT & PLAN:   Stage IIB invasive ductal carcinoma of (R) breast; ER+/PR+/HER2+:  -Recalled from screening mammogram in 03/2017 for (R) breast abnormality. Diagnostic mammogram/ultrasound of (R) breast suspicious for malignancy. Underwent (R) breast biopsy on 04/13/17 revealing invasive   ductal carcinoma, grade 3, ER+, PR+, HER2+. Seen initially in consultation with Dr. Lindi Adie at [facility], and patient elected to have her treatment closer to home and presented to [facility] for cycle #1 neoadjuvant chemo with [facility] on 05/29/17. Plan is to complete 6 cycles of neoadjuvant TCHP before breast  surgery. She wants Dr. Lucia Gaskins in [place] to do her surgery; we will help facilitate that in the future.  -Symptoms with cycle #1 include decreased appetite/weight loss, diarrhea, and heartburn.  She is continuing to recover.  -Pre-treatment ECHO 05/07/17 with normal EF 65-70%; she will need repeat ECHO in late 07/2017; will place these orders at subsequent follow-up.  -Return to cancer center for cycle #2 as scheduled. Will add on follow-up visit for cycle #3 TCHP.   Diarrhea/Heartburn:  -Likely chemo-induced. I am not sure that she has been taking sufficient amount of OTC Imodium to control her symptoms; provided her with additional education on how to use the Imodium.  -Also provided her with prescription for Lomotil to use PRN for diarrhea not controlled with Imodium.   -GERD symptoms could be secondary to chemo and/or steroids.  Will start her on Prilosec; prescription e-scribed to her pharmacy.   Weight loss:  -She has had substantial weight loss (>10 lbs) in a few short weeks. Discussed the use of high-protein, high-calorie foods to help maintain her weight during treatment, as able.  -Will also place Nutrition consultation for additional support for her.  -Encouraged her to push non-caffeinated/non-alcoholic fluids as tolerated to avoid dehydration when food intake is low.   Insomnia/Concerns re: diabetes:  -Requesting refills of her Ambien. Her PCP has been managing this. Encouraged her to ask PCP if they would be willing to refill; if they are not, then I am happy to give her a refill, but explained the importance of maintaining continuity of care for her other non-cancer issues/conditions.  -Will collect Hgb A1c with next chemo cycle and forward results to her PCP. She understands that it is important for her PCP to continue to manage her other conditions.       Dispo:  -Return to cancer center as scheduled for cycle #2 TCHP.  -Follow-up visit to be added to cycle #3  chemotherapy.    All questions were answered to patient's stated satisfaction. Encouraged patient to call with any new concerns or questions before her next visit to the cancer center and we can certain see her sooner, if needed.    Plan of care discussed with Dr. Talbert Cage, who agrees with the above aforementioned.    Orders placed this encounter:  Orders Placed This Encounter  Procedures  . Hemoglobin A1c      Mike Craze, NP [place] [phone]

## 2017-06-19 ENCOUNTER — Encounter (HOSPITAL_COMMUNITY): Payer: 59 | Attending: Adult Health

## 2017-06-19 ENCOUNTER — Encounter (HOSPITAL_COMMUNITY): Payer: Self-pay

## 2017-06-19 ENCOUNTER — Encounter (HOSPITAL_COMMUNITY): Payer: 59

## 2017-06-19 VITALS — BP 132/74 | HR 59 | Temp 98.9°F | Resp 19 | Wt 264.7 lb

## 2017-06-19 DIAGNOSIS — E785 Hyperlipidemia, unspecified: Secondary | ICD-10-CM | POA: Diagnosis not present

## 2017-06-19 DIAGNOSIS — Z9221 Personal history of antineoplastic chemotherapy: Secondary | ICD-10-CM | POA: Diagnosis not present

## 2017-06-19 DIAGNOSIS — C773 Secondary and unspecified malignant neoplasm of axilla and upper limb lymph nodes: Secondary | ICD-10-CM | POA: Diagnosis not present

## 2017-06-19 DIAGNOSIS — Z9049 Acquired absence of other specified parts of digestive tract: Secondary | ICD-10-CM | POA: Insufficient documentation

## 2017-06-19 DIAGNOSIS — Z7984 Long term (current) use of oral hypoglycemic drugs: Secondary | ICD-10-CM | POA: Insufficient documentation

## 2017-06-19 DIAGNOSIS — C50211 Malignant neoplasm of upper-inner quadrant of right female breast: Secondary | ICD-10-CM | POA: Diagnosis not present

## 2017-06-19 DIAGNOSIS — Z90710 Acquired absence of both cervix and uterus: Secondary | ICD-10-CM | POA: Diagnosis not present

## 2017-06-19 DIAGNOSIS — R634 Abnormal weight loss: Secondary | ICD-10-CM | POA: Diagnosis not present

## 2017-06-19 DIAGNOSIS — E118 Type 2 diabetes mellitus with unspecified complications: Secondary | ICD-10-CM

## 2017-06-19 DIAGNOSIS — G4700 Insomnia, unspecified: Secondary | ICD-10-CM | POA: Diagnosis not present

## 2017-06-19 DIAGNOSIS — R197 Diarrhea, unspecified: Secondary | ICD-10-CM | POA: Diagnosis not present

## 2017-06-19 DIAGNOSIS — Z79899 Other long term (current) drug therapy: Secondary | ICD-10-CM | POA: Insufficient documentation

## 2017-06-19 DIAGNOSIS — E119 Type 2 diabetes mellitus without complications: Secondary | ICD-10-CM | POA: Insufficient documentation

## 2017-06-19 DIAGNOSIS — Z5112 Encounter for antineoplastic immunotherapy: Secondary | ICD-10-CM | POA: Diagnosis not present

## 2017-06-19 DIAGNOSIS — Z5111 Encounter for antineoplastic chemotherapy: Secondary | ICD-10-CM | POA: Diagnosis not present

## 2017-06-19 DIAGNOSIS — Z801 Family history of malignant neoplasm of trachea, bronchus and lung: Secondary | ICD-10-CM | POA: Insufficient documentation

## 2017-06-19 DIAGNOSIS — Z170 Estrogen receptor positive status [ER+]: Secondary | ICD-10-CM | POA: Diagnosis not present

## 2017-06-19 DIAGNOSIS — I10 Essential (primary) hypertension: Secondary | ICD-10-CM | POA: Diagnosis not present

## 2017-06-19 DIAGNOSIS — Z87442 Personal history of urinary calculi: Secondary | ICD-10-CM | POA: Diagnosis not present

## 2017-06-19 LAB — CBC WITH DIFFERENTIAL/PLATELET
BASOS ABS: 0 10*3/uL (ref 0.0–0.1)
Basophils Relative: 0 %
Eosinophils Absolute: 0 10*3/uL (ref 0.0–0.7)
Eosinophils Relative: 0 %
HEMATOCRIT: 32.9 % — AB (ref 36.0–46.0)
Hemoglobin: 11 g/dL — ABNORMAL LOW (ref 12.0–15.0)
LYMPHS ABS: 1.1 10*3/uL (ref 0.7–4.0)
LYMPHS PCT: 14 %
MCH: 28.6 pg (ref 26.0–34.0)
MCHC: 33.4 g/dL (ref 30.0–36.0)
MCV: 85.7 fL (ref 78.0–100.0)
Monocytes Absolute: 0.4 10*3/uL (ref 0.1–1.0)
Monocytes Relative: 5 %
NEUTROS ABS: 6.3 10*3/uL (ref 1.7–7.7)
Neutrophils Relative %: 81 %
Platelets: 148 10*3/uL — ABNORMAL LOW (ref 150–400)
RBC: 3.84 MIL/uL — AB (ref 3.87–5.11)
RDW: 14.9 % (ref 11.5–15.5)
WBC: 7.8 10*3/uL (ref 4.0–10.5)

## 2017-06-19 LAB — COMPREHENSIVE METABOLIC PANEL
ALK PHOS: 62 U/L (ref 38–126)
ALT: 62 U/L — AB (ref 14–54)
AST: 40 U/L (ref 15–41)
Albumin: 3.9 g/dL (ref 3.5–5.0)
Anion gap: 7 (ref 5–15)
BUN: 15 mg/dL (ref 6–20)
CALCIUM: 10 mg/dL (ref 8.9–10.3)
CHLORIDE: 105 mmol/L (ref 101–111)
CO2: 26 mmol/L (ref 22–32)
CREATININE: 0.77 mg/dL (ref 0.44–1.00)
GFR calc Af Amer: >60 mL/min (ref >60)
GFR calc non Af Amer: >60 mL/min (ref >60)
GLUCOSE: 182 mg/dL — AB (ref 65–99)
Potassium: 4 mmol/L (ref 3.5–5.1)
SODIUM: 138 mmol/L (ref 135–145)
Total Bilirubin: 0.5 mg/dL (ref 0.3–1.2)
Total Protein: 6.8 g/dL (ref 6.5–8.1)

## 2017-06-19 LAB — HEMOGLOBIN A1C
Hgb A1c MFr Bld: 6.8 % — ABNORMAL HIGH (ref 4.8–5.6)
Mean Plasma Glucose: 148.46 mg/dL

## 2017-06-19 MED ORDER — ACETAMINOPHEN 325 MG PO TABS
650.0000 mg | ORAL_TABLET | Freq: Once | ORAL | Status: AC
  Administered 2017-06-19: 650 mg via ORAL

## 2017-06-19 MED ORDER — PALONOSETRON HCL INJECTION 0.25 MG/5ML
0.2500 mg | Freq: Once | INTRAVENOUS | Status: AC
  Administered 2017-06-19: 0.25 mg via INTRAVENOUS
  Filled 2017-06-19: qty 5

## 2017-06-19 MED ORDER — ACETAMINOPHEN 325 MG PO TABS
ORAL_TABLET | ORAL | Status: AC
  Filled 2017-06-19: qty 2

## 2017-06-19 MED ORDER — SODIUM CHLORIDE 0.9 % IV SOLN
750.0000 mg | Freq: Once | INTRAVENOUS | Status: AC
  Administered 2017-06-19: 750 mg via INTRAVENOUS
  Filled 2017-06-19: qty 35.72

## 2017-06-19 MED ORDER — DEXAMETHASONE SODIUM PHOSPHATE 10 MG/ML IJ SOLN
10.0000 mg | Freq: Once | INTRAMUSCULAR | Status: AC
  Administered 2017-06-19: 10 mg via INTRAVENOUS
  Filled 2017-06-19: qty 1

## 2017-06-19 MED ORDER — SODIUM CHLORIDE 0.9 % IV SOLN
Freq: Once | INTRAVENOUS | Status: AC
  Administered 2017-06-19: 10:00:00 via INTRAVENOUS

## 2017-06-19 MED ORDER — DIPHENHYDRAMINE HCL 25 MG PO CAPS
ORAL_CAPSULE | ORAL | Status: AC
  Filled 2017-06-19: qty 2

## 2017-06-19 MED ORDER — PEGFILGRASTIM 6 MG/0.6ML ~~LOC~~ PSKT
6.0000 mg | PREFILLED_SYRINGE | Freq: Once | SUBCUTANEOUS | Status: AC
  Administered 2017-06-19: 6 mg via SUBCUTANEOUS
  Filled 2017-06-19: qty 0.6

## 2017-06-19 MED ORDER — SODIUM CHLORIDE 0.9 % IV SOLN
900.0000 mg | Freq: Once | INTRAVENOUS | Status: AC
  Administered 2017-06-19: 900 mg via INTRAVENOUS
  Filled 2017-06-19: qty 90

## 2017-06-19 MED ORDER — DIPHENHYDRAMINE HCL 25 MG PO CAPS
50.0000 mg | ORAL_CAPSULE | Freq: Once | ORAL | Status: AC
  Administered 2017-06-19: 50 mg via ORAL

## 2017-06-19 MED ORDER — SODIUM CHLORIDE 0.9 % IV SOLN
420.0000 mg | Freq: Once | INTRAVENOUS | Status: AC
  Administered 2017-06-19: 420 mg via INTRAVENOUS
  Filled 2017-06-19: qty 14

## 2017-06-19 MED ORDER — HEPARIN SOD (PORK) LOCK FLUSH 100 UNIT/ML IV SOLN
500.0000 [IU] | Freq: Once | INTRAVENOUS | Status: AC | PRN
  Administered 2017-06-19: 500 [IU]
  Filled 2017-06-19 (×2): qty 5

## 2017-06-19 MED ORDER — SODIUM CHLORIDE 0.9% FLUSH
10.0000 mL | INTRAVENOUS | Status: DC | PRN
  Administered 2017-06-19: 10 mL
  Filled 2017-06-19: qty 10

## 2017-06-19 MED ORDER — DOCETAXEL CHEMO INJECTION 160 MG/16ML
75.0000 mg/m2 | Freq: Once | INTRAVENOUS | Status: AC
  Administered 2017-06-19: 170 mg via INTRAVENOUS
  Filled 2017-06-19: qty 17

## 2017-06-19 MED ORDER — TRASTUZUMAB CHEMO 150 MG IV SOLR: 6.0000 mg/kg | Freq: Once | INTRAVENOUS | Status: DC

## 2017-06-19 NOTE — Patient Instructions (Signed)
[facility] Discharge Instructions for Patients Receiving Chemotherapy   Beginning January 23rd 2017 lab work for the Cancer Center will be done in the  Main lab at [place] on 1st floor. If you have a lab appointment with the Cancer Center please come in thru the  Main Entrance and check in at the main information desk   Today you received the following chemotherapy agents   To help prevent nausea and vomiting after your treatment, we encourage you to take your nausea medication     If you develop nausea and vomiting, or diarrhea that is not controlled by your medication, call the clinic.  The clinic phone number is [phone]. Office hours are Monday-Friday 8:30am-5:00pm.  BELOW ARE SYMPTOMS THAT SHOULD BE REPORTED IMMEDIATELY:  *FEVER GREATER THAN 101.0 F  *CHILLS WITH OR WITHOUT FEVER  NAUSEA AND VOMITING THAT IS NOT CONTROLLED WITH YOUR NAUSEA MEDICATION  *UNUSUAL SHORTNESS OF BREATH  *UNUSUAL BRUISING OR BLEEDING  TENDERNESS IN MOUTH AND THROAT WITH OR WITHOUT PRESENCE OF ULCERS  *URINARY PROBLEMS  *BOWEL PROBLEMS  UNUSUAL RASH Items with * indicate a potential emergency and should be followed up as soon as possible. If you have an emergency after office hours please contact your primary care physician or go to the nearest emergency department.  Please call the clinic during office hours if you have any questions or concerns.   You may also contact the Patient Navigator at [phone] should you have any questions or need assistance in obtaining follow up care.      Resources For Cancer Patients and their Caregivers ? American Cancer Society: Can assist with transportation, wigs, general needs, runs Look Good Feel Better.        1-888-227-6333 ? Cancer Care: Provides financial assistance, online support groups, medication/co-pay assistance.  1-800-813-HOPE (4673) ? [facility] Assists [place] cancer  patients and their families through emotional , educational and financial support.  336-427-4357 ? [place] DSS Where to apply for food stamps, Medicaid and utility assistance. 336-342-1394 ? [facility]: Transportation to medical appointments. 336-347-2287 ? Social Security Administration: May apply for disability if have a Stage IV cancer. 336-342-7796 1-800-772-1213 ? [place] Aging, Disability and Transit Services: Assists with nutrition, care and transit needs. [phone]         

## 2017-06-19 NOTE — Progress Notes (Signed)
Labs reviewed with MD. Proceed with treatment.  Treatment given per orders. Patient tolerated it well without problems. Vitals stable and discharged home from clinic ambulatory. Follow up as scheduled. Renee KitchenJeanella Harris arrived today for [facility] neulasta on body injector. See MAR for administration details. Injector in place and engaged with green light indicator on flashing. Tolerated application with out problems.

## 2017-06-19 NOTE — Progress Notes (Signed)
Nutrition Assessment   Reason for Assessment:   Referral for weight loss  ASSESSMENT:  56 year old female with breast cancer.  Patient currently receiving neoadjuvant chemotherapy.  Past medical history of DM, HTN, HLD  Met with patient during chemotherapy today. Patient reports that following first round of chemotherapy she had diarrhea, heartburn, foods taste like dirt and poor appetite.  Patient reports appetite has improved, diarrhea has been under control and heart burn is better after starting pepcid.  Reports typically has been trying to eat oatmeal for breakfast, although this morning going to have a biscuit, lunch is chicken and vegetables and then dinner is more meat and vegetables.  Does not really like hamburger right now and eats more raw vegetables than cooked.    Nutrition Focused Physical Exam: deferred  Medications: decadron, lomotil, imodium, MVI, zofran, compazine, glucophage  Labs: glucose 195, Na 131, creatinine 1.08  Anthropometrics:   Height: 61 inches Weight: 264 lb 11. 2 oz today, up from 252 lb 11.2 oz on 8/29, on 8/17 266 lb 11.2 oz UBW: 260s BMI: 50  Weight gain today after initial 5% weight loss in 2 weeks which was significant.    Estimated Energy Needs  Kcals: 1580-6386 calorie/d Protein: 75-100 g of protein Fluid: > 2.3 L/d  NUTRITION DIAGNOSIS: Inadequate oral intake related to cancer related treatment side effects as evidenced by 5% weight loss in 2 weeks although have regained most of weight and diarrhea, heart burn, taste changes.     MALNUTRITION DIAGNOSIS: none at this time   INTERVENTION:   Discussed importance of nutrition and choosing nutritional dense foods.   Discussed foods to help with blood glucose management. Discussed foods to choose when having diarrhea and fact sheet given to patient. Soft moist protein sheet given to patient as well. Discussed oral nutrition supplements (options to assist with blood glucose control and  high calorie, high protein options if appetite decreases). Samples given and coupons Encouraged patient to check blood glucose on regular basis especially on sick days and call MD for medication adjustment if needed.  Encouraged patient to liberalize diet during times when appetite is extremely poor, otherwise should be monitor intake for good blood glucose control.  Teach back used.    MONITORING, EVALUATION, GOAL: weight trends, intake, glucose   NEXT VISIT: Sept 28 during infusion  Santina Trillo B. Zenia Resides, [place], [place] Registered Dietitian [phone] (pager)

## 2017-06-29 ENCOUNTER — Telehealth (HOSPITAL_COMMUNITY): Payer: Self-pay | Admitting: Emergency Medicine

## 2017-06-29 ENCOUNTER — Other Ambulatory Visit (HOSPITAL_COMMUNITY): Payer: Self-pay | Admitting: Emergency Medicine

## 2017-06-29 ENCOUNTER — Encounter (HOSPITAL_BASED_OUTPATIENT_CLINIC_OR_DEPARTMENT_OTHER): Payer: 59

## 2017-06-29 ENCOUNTER — Encounter (HOSPITAL_COMMUNITY): Payer: Self-pay

## 2017-06-29 ENCOUNTER — Telehealth (HOSPITAL_COMMUNITY): Payer: Self-pay

## 2017-06-29 ENCOUNTER — Telehealth (HOSPITAL_COMMUNITY): Payer: Self-pay | Admitting: Oncology

## 2017-06-29 VITALS — BP 105/50 | HR 86 | Temp 97.6°F | Resp 18 | Wt 244.8 lb

## 2017-06-29 DIAGNOSIS — R110 Nausea: Secondary | ICD-10-CM

## 2017-06-29 DIAGNOSIS — R300 Dysuria: Secondary | ICD-10-CM

## 2017-06-29 DIAGNOSIS — Z170 Estrogen receptor positive status [ER+]: Secondary | ICD-10-CM

## 2017-06-29 DIAGNOSIS — E860 Dehydration: Secondary | ICD-10-CM

## 2017-06-29 DIAGNOSIS — C773 Secondary and unspecified malignant neoplasm of axilla and upper limb lymph nodes: Secondary | ICD-10-CM | POA: Diagnosis not present

## 2017-06-29 DIAGNOSIS — C50211 Malignant neoplasm of upper-inner quadrant of right female breast: Secondary | ICD-10-CM

## 2017-06-29 DIAGNOSIS — R197 Diarrhea, unspecified: Secondary | ICD-10-CM

## 2017-06-29 LAB — COMPREHENSIVE METABOLIC PANEL
ALBUMIN: 4.5 g/dL (ref 3.5–5.0)
ALT: 83 U/L — AB (ref 14–54)
AST: 48 U/L — AB (ref 15–41)
Alkaline Phosphatase: 115 U/L (ref 38–126)
Anion gap: 13 (ref 5–15)
BUN: 24 mg/dL — AB (ref 6–20)
CHLORIDE: 96 mmol/L — AB (ref 101–111)
CO2: 23 mmol/L (ref 22–32)
CREATININE: 1.41 mg/dL — AB (ref 0.44–1.00)
Calcium: 11.4 mg/dL — ABNORMAL HIGH (ref 8.9–10.3)
GFR calc Af Amer: 48 mL/min — ABNORMAL LOW (ref >60)
GFR calc non Af Amer: 42 mL/min — ABNORMAL LOW (ref >60)
GLUCOSE: 179 mg/dL — AB (ref 65–99)
Potassium: 3.5 mmol/L (ref 3.5–5.1)
SODIUM: 132 mmol/L — AB (ref 135–145)
Total Bilirubin: 0.8 mg/dL (ref 0.3–1.2)
Total Protein: 7.9 g/dL (ref 6.5–8.1)

## 2017-06-29 LAB — URINALYSIS, COMPLETE (UACMP) WITH MICROSCOPIC
BILIRUBIN URINE: NEGATIVE
Glucose, UA: NEGATIVE mg/dL
KETONES UR: NEGATIVE mg/dL
Nitrite: NEGATIVE
PROTEIN: 100 mg/dL — AB
SPECIFIC GRAVITY, URINE: 1.019 (ref 1.005–1.030)
pH: 5 (ref 5.0–8.0)

## 2017-06-29 LAB — CBC WITH DIFFERENTIAL/PLATELET
BASOS PCT: 0 %
Basophils Absolute: 0 10*3/uL (ref 0.0–0.1)
EOS ABS: 0 10*3/uL (ref 0.0–0.7)
Eosinophils Relative: 0 %
HCT: 33.3 % — ABNORMAL LOW (ref 36.0–46.0)
Hemoglobin: 11.6 g/dL — ABNORMAL LOW (ref 12.0–15.0)
LYMPHS PCT: 24 %
Lymphs Abs: 2.7 10*3/uL (ref 0.7–4.0)
MCH: 29.1 pg (ref 26.0–34.0)
MCHC: 34.8 g/dL (ref 30.0–36.0)
MCV: 83.7 fL (ref 78.0–100.0)
Monocytes Absolute: 1.4 10*3/uL — ABNORMAL HIGH (ref 0.1–1.0)
Monocytes Relative: 12 %
NEUTROS PCT: 64 %
Neutro Abs: 7.2 10*3/uL (ref 1.7–7.7)
PLATELETS: 86 10*3/uL — AB (ref 150–400)
RBC: 3.98 MIL/uL (ref 3.87–5.11)
RDW: 15.6 % — ABNORMAL HIGH (ref 11.5–15.5)
WBC: 11.3 10*3/uL — ABNORMAL HIGH (ref 4.0–10.5)

## 2017-06-29 MED ORDER — HEPARIN SOD (PORK) LOCK FLUSH 100 UNIT/ML IV SOLN
500.0000 [IU] | Freq: Once | INTRAVENOUS | Status: AC
  Administered 2017-06-29: 500 [IU] via INTRAVENOUS

## 2017-06-29 MED ORDER — HEPARIN SOD (PORK) LOCK FLUSH 100 UNIT/ML IV SOLN
INTRAVENOUS | Status: AC
  Filled 2017-06-29: qty 5

## 2017-06-29 MED ORDER — SODIUM CHLORIDE 0.9% FLUSH
10.0000 mL | Freq: Once | INTRAVENOUS | Status: AC
Start: 2017-06-29 — End: 2017-06-29
  Administered 2017-06-29: 10 mL via INTRAVENOUS

## 2017-06-29 MED ORDER — SODIUM CHLORIDE 0.9 % IV SOLN
INTRAVENOUS | Status: DC
Start: 2017-06-29 — End: 2017-06-29
  Administered 2017-06-29: 10:00:00 via INTRAVENOUS

## 2017-06-29 NOTE — Patient Instructions (Signed)
[facility]  Discharge Instructions:  You received hydration today. Call for any questions or concerns.  _______________________________________________________________  Thank you for choosing [facility] at [facility] to provide your oncology and hematology care.  To afford each patient quality time with our providers, please arrive at least 15 minutes before your scheduled appointment.  You need to re-schedule your appointment if you arrive 10 or more minutes late.  We strive to give you quality time with our providers, and arriving late affects you and other patients whose appointments are after yours.  Also, if you no show three or more times for appointments you may be dismissed from the clinic.  Again, thank you for choosing [facility] at [facility]. Our hope is that these requests will allow you access to exceptional care and in a timely manner. _______________________________________________________________  If you have questions after your visit, please contact our office at [phone] between the hours of 8:30 a.m. and 5:00 p.m. Voicemails left after 4:30 p.m. will not be returned until the following business day. _______________________________________________________________  For prescription refill requests, have your pharmacy contact our office. _______________________________________________________________  Recommendations made by the consultant and any test results will be sent to your referring physician. _______________________________________________________________ 

## 2017-06-29 NOTE — Progress Notes (Signed)
To treatment room for labs and hydration for diarrhea and nose bleeds over the weekend.  Patient used lomotil/imodium as directed with not much relief.  Nose bleeds would start off dripping and end with pressure.  When she would blow her nose she would start another nose bleed.  Stated she has had this problem in the past but not this much at one time.  She is going to start using nasal spray because she feels this may be due to sinus problems.   No other complaints at this time.   Patient complained of burning with urination.  Reviewed labs and patients symptoms with orders received.  To collect urine specimen.    Patient tolerated hydration with no complaints voiced.  Stated she felt better and no dizziness when she went to the bathroom earlier this morning.  VSS with discharge and left via wheelchair with husband.

## 2017-06-29 NOTE — Telephone Encounter (Signed)
ENROLLED PT IN [facility] COPAY CARD ASSIST PRO FOR BOTH HERCEPTIN AND PERJETA 05/29/17-05/28/18 UO#[id number]  ALSO ENROLLED PT IN [facility] COPAY FOR NEULASTA ON PRO [id number] [place] # [phone]  EXP 08/2019 CVV [id number]

## 2017-06-29 NOTE — Telephone Encounter (Signed)
Patients husband called stating patient has been having nosebleeds, nausea, and diarrhea. She has had 5-6 nosebleeds over the last 2 days. She has had diarrhea and nausea for the last week. She is taking compazine for the nausea and lomotil for the diarrhea. She is not taking imodium and she is not following the directions for the imodium. Husband states she is dizzy and light-headed also. Reviewed with Dr. Talbert Cage. Patient can come to cancer center for labs and fluids or go to ER. Reviewed with treatment room coordinator, Barnetta Chapel, to be sure there was space for patient to receive fluids at the cancer center.There is ample room for patient to receive hydration. Notified husband who states they want to come to the cancer center and not the ER. Instructed patient to come as soon as possible with understanding verbalized.

## 2017-06-29 NOTE — Telephone Encounter (Signed)
Called pt back this morning.  Pt said husband was on the phone with another nurse.  She said she had a bad weekend, nothing tastes good, diarrhea, didn't want to eat.  I tild her I would put her down for fluids after her next 2 cycles.  Maybe this would help her from feeling bad.  She could always cancel these appts if she wanted.  She verbalized understanding.

## 2017-06-30 ENCOUNTER — Other Ambulatory Visit (HOSPITAL_COMMUNITY): Payer: Self-pay | Admitting: Oncology

## 2017-06-30 MED ORDER — CIPROFLOXACIN HCL 500 MG PO TABS: 500.0000 mg | ORAL_TABLET | Freq: Two times a day (BID) | ORAL | 0 refills | Status: AC

## 2017-06-30 NOTE — Progress Notes (Signed)
Sharyn Lull, can you call the patient and let her know it looks like she has a UTI on her UA. I'm sending in a prescription for cipro 500mg  PO BID x 10 days for her.

## 2017-07-02 LAB — URINE CULTURE: Culture: >=100000 — AB

## 2017-07-06 ENCOUNTER — Telehealth (HOSPITAL_COMMUNITY): Payer: Self-pay | Admitting: Oncology

## 2017-07-10 ENCOUNTER — Encounter (HOSPITAL_COMMUNITY): Payer: 59

## 2017-07-10 ENCOUNTER — Encounter (HOSPITAL_BASED_OUTPATIENT_CLINIC_OR_DEPARTMENT_OTHER): Payer: 59 | Admitting: Hematology and Oncology

## 2017-07-10 ENCOUNTER — Ambulatory Visit (HOSPITAL_COMMUNITY): Payer: 59

## 2017-07-10 ENCOUNTER — Encounter (HOSPITAL_COMMUNITY): Payer: Self-pay

## 2017-07-10 ENCOUNTER — Ambulatory Visit (HOSPITAL_COMMUNITY): Payer: 59 | Admitting: Adult Health

## 2017-07-10 ENCOUNTER — Encounter (HOSPITAL_BASED_OUTPATIENT_CLINIC_OR_DEPARTMENT_OTHER): Payer: 59

## 2017-07-10 VITALS — BP 137/69 | HR 77 | Temp 98.6°F | Resp 16 | Wt 259.0 lb

## 2017-07-10 DIAGNOSIS — C773 Secondary and unspecified malignant neoplasm of axilla and upper limb lymph nodes: Secondary | ICD-10-CM

## 2017-07-10 DIAGNOSIS — Z5112 Encounter for antineoplastic immunotherapy: Secondary | ICD-10-CM

## 2017-07-10 DIAGNOSIS — R197 Diarrhea, unspecified: Secondary | ICD-10-CM | POA: Diagnosis not present

## 2017-07-10 DIAGNOSIS — C50211 Malignant neoplasm of upper-inner quadrant of right female breast: Secondary | ICD-10-CM

## 2017-07-10 DIAGNOSIS — Z5111 Encounter for antineoplastic chemotherapy: Secondary | ICD-10-CM

## 2017-07-10 DIAGNOSIS — Z170 Estrogen receptor positive status [ER+]: Secondary | ICD-10-CM

## 2017-07-10 LAB — CBC WITH DIFFERENTIAL/PLATELET
BASOS PCT: 0 %
Basophils Absolute: 0 10*3/uL (ref 0.0–0.1)
EOS ABS: 0 10*3/uL (ref 0.0–0.7)
EOS PCT: 0 %
HCT: 26.6 % — ABNORMAL LOW (ref 36.0–46.0)
Hemoglobin: 8.9 g/dL — ABNORMAL LOW (ref 12.0–15.0)
Lymphocytes Relative: 35 %
Lymphs Abs: 1.7 10*3/uL (ref 0.7–4.0)
MCH: 30.3 pg (ref 26.0–34.0)
MCHC: 33.5 g/dL (ref 30.0–36.0)
MCV: 90.5 fL (ref 78.0–100.0)
MONO ABS: 0.6 10*3/uL (ref 0.1–1.0)
Monocytes Relative: 12 %
NEUTROS ABS: 2.6 10*3/uL (ref 1.7–7.7)
Neutrophils Relative %: 53 %
Platelets: 110 10*3/uL — ABNORMAL LOW (ref 150–400)
RBC: 2.94 MIL/uL — ABNORMAL LOW (ref 3.87–5.11)
RDW: 21.1 % — AB (ref 11.5–15.5)
WBC: 4.9 10*3/uL (ref 4.0–10.5)

## 2017-07-10 LAB — COMPREHENSIVE METABOLIC PANEL
ALBUMIN: 3.6 g/dL (ref 3.5–5.0)
ALK PHOS: 68 U/L (ref 38–126)
ALT: 48 U/L (ref 14–54)
AST: 35 U/L (ref 15–41)
Anion gap: 9 (ref 5–15)
BUN: 15 mg/dL (ref 6–20)
CALCIUM: 9.7 mg/dL (ref 8.9–10.3)
CO2: 26 mmol/L (ref 22–32)
CREATININE: 0.82 mg/dL (ref 0.44–1.00)
Chloride: 106 mmol/L (ref 101–111)
GFR calc Af Amer: >60 mL/min (ref >60)
GFR calc non Af Amer: >60 mL/min (ref >60)
Glucose, Bld: 129 mg/dL — ABNORMAL HIGH (ref 65–99)
Potassium: 3.5 mmol/L (ref 3.5–5.1)
SODIUM: 141 mmol/L (ref 135–145)
Total Bilirubin: 0.6 mg/dL (ref 0.3–1.2)
Total Protein: 6.2 g/dL — ABNORMAL LOW (ref 6.5–8.1)

## 2017-07-10 MED ORDER — PERTUZUMAB CHEMO INJECTION 420 MG/14ML
420.0000 mg | Freq: Once | INTRAVENOUS | Status: AC
  Administered 2017-07-10: 420 mg via INTRAVENOUS
  Filled 2017-07-10: qty 14

## 2017-07-10 MED ORDER — HEPARIN SOD (PORK) LOCK FLUSH 100 UNIT/ML IV SOLN
500.0000 [IU] | Freq: Once | INTRAVENOUS | Status: AC | PRN
  Administered 2017-07-10: 500 [IU]

## 2017-07-10 MED ORDER — HEPARIN SOD (PORK) LOCK FLUSH 100 UNIT/ML IV SOLN
500.0000 [IU] | Freq: Once | INTRAVENOUS | Status: AC | PRN
  Administered 2017-07-10: 500 [IU]
  Filled 2017-07-10 (×2): qty 5

## 2017-07-10 MED ORDER — DEXAMETHASONE SODIUM PHOSPHATE 10 MG/ML IJ SOLN
INTRAMUSCULAR | Status: AC
  Filled 2017-07-10: qty 1

## 2017-07-10 MED ORDER — SODIUM CHLORIDE 0.9 % IV SOLN
Freq: Once | INTRAVENOUS | Status: AC
  Administered 2017-07-10: 12:00:00 via INTRAVENOUS

## 2017-07-10 MED ORDER — PALONOSETRON HCL INJECTION 0.25 MG/5ML
INTRAVENOUS | Status: AC
  Filled 2017-07-10: qty 5

## 2017-07-10 MED ORDER — DIPHENHYDRAMINE HCL 25 MG PO CAPS
ORAL_CAPSULE | ORAL | Status: AC
  Filled 2017-07-10: qty 1

## 2017-07-10 MED ORDER — PALONOSETRON HCL INJECTION 0.25 MG/5ML
0.2500 mg | Freq: Once | INTRAVENOUS | Status: AC
  Administered 2017-07-10: 0.25 mg via INTRAVENOUS

## 2017-07-10 MED ORDER — ACETAMINOPHEN 325 MG PO TABS
650.0000 mg | ORAL_TABLET | Freq: Once | ORAL | Status: AC
  Administered 2017-07-10: 650 mg via ORAL

## 2017-07-10 MED ORDER — DIPHENHYDRAMINE HCL 25 MG PO CAPS
50.0000 mg | ORAL_CAPSULE | Freq: Once | ORAL | Status: AC
  Administered 2017-07-10: 50 mg via ORAL

## 2017-07-10 MED ORDER — PEGFILGRASTIM 6 MG/0.6ML ~~LOC~~ PSKT
6.0000 mg | PREFILLED_SYRINGE | Freq: Once | SUBCUTANEOUS | Status: AC
  Administered 2017-07-10: 6 mg via SUBCUTANEOUS
  Filled 2017-07-10: qty 0.6

## 2017-07-10 MED ORDER — ACETAMINOPHEN 325 MG PO TABS
ORAL_TABLET | ORAL | Status: AC
  Filled 2017-07-10: qty 2

## 2017-07-10 MED ORDER — TRASTUZUMAB CHEMO 150 MG IV SOLR
700.0000 mg | Freq: Once | INTRAVENOUS | Status: AC
  Administered 2017-07-10: 700 mg via INTRAVENOUS
  Filled 2017-07-10: qty 33.33

## 2017-07-10 MED ORDER — SODIUM CHLORIDE 0.9% FLUSH
10.0000 mL | INTRAVENOUS | Status: DC | PRN
  Administered 2017-07-10: 10 mL
  Filled 2017-07-10: qty 10

## 2017-07-10 MED ORDER — DEXAMETHASONE SODIUM PHOSPHATE 10 MG/ML IJ SOLN
10.0000 mg | Freq: Once | INTRAMUSCULAR | Status: AC
  Administered 2017-07-10: 10 mg via INTRAVENOUS

## 2017-07-10 MED ORDER — DEXTROSE 5 % IV SOLN
75.0000 mg/m2 | Freq: Once | INTRAVENOUS | Status: AC
  Administered 2017-07-10: 170 mg via INTRAVENOUS
  Filled 2017-07-10: qty 17

## 2017-07-10 MED ORDER — CARBOPLATIN CHEMO INJECTION 600 MG/60ML
900.0000 mg | Freq: Once | INTRAVENOUS | Status: AC
  Administered 2017-07-10: 900 mg via INTRAVENOUS
  Filled 2017-07-10: qty 90

## 2017-07-10 NOTE — Patient Instructions (Signed)
[facility] Discharge Instructions for Patients Receiving Chemotherapy   Beginning January 23rd 2017 lab work for the Cancer Center will be done in the  Main lab at [place] on 1st floor. If you have a lab appointment with the Cancer Center please come in thru the  Main Entrance and check in at the main information desk   Today you received the following chemotherapy agents   To help prevent nausea and vomiting after your treatment, we encourage you to take your nausea medication     If you develop nausea and vomiting, or diarrhea that is not controlled by your medication, call the clinic.  The clinic phone number is [phone]. Office hours are Monday-Friday 8:30am-5:00pm.  BELOW ARE SYMPTOMS THAT SHOULD BE REPORTED IMMEDIATELY:  *FEVER GREATER THAN 101.0 F  *CHILLS WITH OR WITHOUT FEVER  NAUSEA AND VOMITING THAT IS NOT CONTROLLED WITH YOUR NAUSEA MEDICATION  *UNUSUAL SHORTNESS OF BREATH  *UNUSUAL BRUISING OR BLEEDING  TENDERNESS IN MOUTH AND THROAT WITH OR WITHOUT PRESENCE OF ULCERS  *URINARY PROBLEMS  *BOWEL PROBLEMS  UNUSUAL RASH Items with * indicate a potential emergency and should be followed up as soon as possible. If you have an emergency after office hours please contact your primary care physician or go to the nearest emergency department.  Please call the clinic during office hours if you have any questions or concerns.   You may also contact the Patient Navigator at [phone] should you have any questions or need assistance in obtaining follow up care.      Resources For Cancer Patients and their Caregivers ? American Cancer Society: Can assist with transportation, wigs, general needs, runs Look Good Feel Better.        1-888-227-6333 ? Cancer Care: Provides financial assistance, online support groups, medication/co-pay assistance.  1-800-813-HOPE (4673) ? [facility] Assists [place] cancer  patients and their families through emotional , educational and financial support.  336-427-4357 ? [place] DSS Where to apply for food stamps, Medicaid and utility assistance. 336-342-1394 ? [facility]: Transportation to medical appointments. 336-347-2287 ? Social Security Administration: May apply for disability if have a Stage IV cancer. 336-342-7796 1-800-772-1213 ? [place] Aging, Disability and Transit Services: Assists with nutrition, care and transit needs. 336-349-2343         

## 2017-07-10 NOTE — Progress Notes (Signed)
Nutrition Follow-up:  Met with patient during infusion today.  Sister at chairside.  Patient with breast cancer.    Patient reports appetite is better, usually intake decreases on Tuesday following chemotherapy on Friday and is down for the next 5 days.  Noted diarrhea and nosebleeds during last round of chemotherapy.  Patient reports this has resolved.  Reports that she has been drinking glucerna at breakfast, lunch yesterday was pork sandwich with mayo and coleslaw and supper was the same.  Also had apples yesterday.    No other nutrition impact symptoms reported today  Medications: reveiwed  Labs: glucose 129  Anthropometrics:   Weight 259 lb today decreased from 264 lb 11.2 oz on 9/7 (last nutrition visit).  1% weight loss in the last 3 weeks.   NUTRITION DIAGNOSIS: Inadequate oral intake improved following chemotherapy   MALNUTRITION DIAGNOSIS: continue to monitor   INTERVENTION:   Reviewed foods to consume with diarrhea.  Patient reports she still has fact sheet.  Patient reports she will take medication to help with diarrhea as well.   Patient reports she is checking blood glucose and knows to call MD with changes.   Coupons given for glucerna    MONITORING, EVALUATION, GOAL: weight trends, intake, glucose   NEXT VISIT: October 26 during infusion  Renee Harris B. Zenia Resides, [place], [place] Registered Dietitian [phone] (pager)

## 2017-07-10 NOTE — Progress Notes (Signed)
Treatment given per orders. Patient tolerated it well without problems. Vitals stable and discharged home from clinic ambulatory. Follow up as scheduled.  

## 2017-07-11 ENCOUNTER — Other Ambulatory Visit (HOSPITAL_COMMUNITY): Payer: Self-pay | Admitting: Adult Health

## 2017-07-11 DIAGNOSIS — K521 Toxic gastroenteritis and colitis: Secondary | ICD-10-CM

## 2017-07-11 DIAGNOSIS — Z170 Estrogen receptor positive status [ER+]: Principal | ICD-10-CM

## 2017-07-11 DIAGNOSIS — T451X5A Adverse effect of antineoplastic and immunosuppressive drugs, initial encounter: Secondary | ICD-10-CM

## 2017-07-11 DIAGNOSIS — C50211 Malignant neoplasm of upper-inner quadrant of right female breast: Secondary | ICD-10-CM

## 2017-07-15 ENCOUNTER — Other Ambulatory Visit (HOSPITAL_COMMUNITY): Payer: Self-pay | Admitting: Adult Health

## 2017-07-15 ENCOUNTER — Ambulatory Visit (HOSPITAL_COMMUNITY): Payer: 59

## 2017-07-15 DIAGNOSIS — Z170 Estrogen receptor positive status [ER+]: Principal | ICD-10-CM

## 2017-07-15 DIAGNOSIS — T451X5A Adverse effect of antineoplastic and immunosuppressive drugs, initial encounter: Secondary | ICD-10-CM

## 2017-07-15 DIAGNOSIS — C50211 Malignant neoplasm of upper-inner quadrant of right female breast: Secondary | ICD-10-CM

## 2017-07-15 DIAGNOSIS — K521 Toxic gastroenteritis and colitis: Secondary | ICD-10-CM

## 2017-07-16 ENCOUNTER — Other Ambulatory Visit (HOSPITAL_COMMUNITY): Payer: Self-pay | Admitting: Adult Health

## 2017-07-16 DIAGNOSIS — C50211 Malignant neoplasm of upper-inner quadrant of right female breast: Secondary | ICD-10-CM

## 2017-07-16 DIAGNOSIS — T451X5A Adverse effect of antineoplastic and immunosuppressive drugs, initial encounter: Secondary | ICD-10-CM

## 2017-07-16 DIAGNOSIS — K521 Toxic gastroenteritis and colitis: Secondary | ICD-10-CM

## 2017-07-16 DIAGNOSIS — Z170 Estrogen receptor positive status [ER+]: Principal | ICD-10-CM

## 2017-07-16 MED ORDER — DIPHENOXYLATE-ATROPINE 2.5-0.025 MG PO TABS: ORAL_TABLET | ORAL | 0 refills | Status: DC

## 2017-07-17 ENCOUNTER — Encounter (HOSPITAL_COMMUNITY): Payer: 59 | Attending: Hematology and Oncology

## 2017-07-17 ENCOUNTER — Encounter (HOSPITAL_COMMUNITY): Payer: Self-pay

## 2017-07-17 VITALS — BP 136/73 | HR 86 | Temp 98.0°F | Resp 18 | Wt 244.0 lb

## 2017-07-17 DIAGNOSIS — R634 Abnormal weight loss: Secondary | ICD-10-CM | POA: Insufficient documentation

## 2017-07-17 DIAGNOSIS — G4700 Insomnia, unspecified: Secondary | ICD-10-CM | POA: Insufficient documentation

## 2017-07-17 DIAGNOSIS — C50211 Malignant neoplasm of upper-inner quadrant of right female breast: Secondary | ICD-10-CM | POA: Diagnosis not present

## 2017-07-17 DIAGNOSIS — Z79899 Other long term (current) drug therapy: Secondary | ICD-10-CM | POA: Insufficient documentation

## 2017-07-17 DIAGNOSIS — Z87442 Personal history of urinary calculi: Secondary | ICD-10-CM | POA: Insufficient documentation

## 2017-07-17 DIAGNOSIS — R197 Diarrhea, unspecified: Secondary | ICD-10-CM | POA: Insufficient documentation

## 2017-07-17 DIAGNOSIS — I10 Essential (primary) hypertension: Secondary | ICD-10-CM | POA: Insufficient documentation

## 2017-07-17 DIAGNOSIS — E785 Hyperlipidemia, unspecified: Secondary | ICD-10-CM | POA: Insufficient documentation

## 2017-07-17 DIAGNOSIS — Z9221 Personal history of antineoplastic chemotherapy: Secondary | ICD-10-CM | POA: Insufficient documentation

## 2017-07-17 DIAGNOSIS — Z90710 Acquired absence of both cervix and uterus: Secondary | ICD-10-CM | POA: Insufficient documentation

## 2017-07-17 DIAGNOSIS — Z801 Family history of malignant neoplasm of trachea, bronchus and lung: Secondary | ICD-10-CM | POA: Insufficient documentation

## 2017-07-17 DIAGNOSIS — K1379 Other lesions of oral mucosa: Secondary | ICD-10-CM

## 2017-07-17 DIAGNOSIS — C773 Secondary and unspecified malignant neoplasm of axilla and upper limb lymph nodes: Secondary | ICD-10-CM | POA: Diagnosis not present

## 2017-07-17 DIAGNOSIS — Z170 Estrogen receptor positive status [ER+]: Secondary | ICD-10-CM | POA: Insufficient documentation

## 2017-07-17 DIAGNOSIS — Z9049 Acquired absence of other specified parts of digestive tract: Secondary | ICD-10-CM | POA: Insufficient documentation

## 2017-07-17 DIAGNOSIS — E119 Type 2 diabetes mellitus without complications: Secondary | ICD-10-CM | POA: Insufficient documentation

## 2017-07-17 DIAGNOSIS — Z7984 Long term (current) use of oral hypoglycemic drugs: Secondary | ICD-10-CM | POA: Insufficient documentation

## 2017-07-17 MED ORDER — MAGIC MOUTHWASH W/LIDOCAINE: 5.0000 mL | Freq: Four times a day (QID) | ORAL | 1 refills | Status: DC | PRN

## 2017-07-17 MED ORDER — HEPARIN SOD (PORK) LOCK FLUSH 100 UNIT/ML IV SOLN
500.0000 [IU] | Freq: Once | INTRAVENOUS | Status: AC
  Administered 2017-07-17: 500 [IU] via INTRAVENOUS
  Filled 2017-07-17: qty 5

## 2017-07-17 MED ORDER — SODIUM CHLORIDE 0.9 % IV SOLN
INTRAVENOUS | Status: DC
  Administered 2017-07-17: 12:00:00 via INTRAVENOUS

## 2017-07-17 NOTE — Progress Notes (Signed)
[facility] Follow-up Visit:  Assessment: Malignant neoplasm of upper-inner quadrant of right breast in female, estrogen receptor positive ([place]) 56 y.o. female with diagnosis of stage IIB hormone-receptor positive HER-2-positive carcinoma of the breast undergoing neoadjuvant chemotherapy immunotherapy with TCHP combination. Tolerating therapy reasonably well with grade 2 diarrhea and grade 1 lower extremity edema.  Diarrhea appears to be improving at this time. Patient had mild nasal bleeding which was self-contained. Clinical evaluation and lab work today permissive to proceed with the third cycle of chemoimmunotherapy as scheduled.  Plan:  --Proceed with TCHP Cycle #3 today --MRI Breast prior to RTC --ECHO prior to RTC --RTC 3 weeks: labs, clinic visit, possible Cycle #4 of TCHP  Voice recognition software was used and creation of this note. Despite my best effort at editing the text, some misspelling/errors may have occurred.  Orders Placed This Encounter  Procedures  . MR Breast Bilateral Wo Contrast    Standing Status:   Future    Standing Expiration Date:   07/10/2018    Order Specific Question:   What is the patient's sedation requirement?    Answer:   No Sedation    Order Specific Question:   Does the patient have a pacemaker or implanted devices?    Answer:   No    Order Specific Question:   Preferred imaging location?    Answer:   [facility] (table limit-550lbs)    Order Specific Question:   Radiology Contrast Protocol - do NOT remove file path    Answer:   \\[email or web address]    Order Specific Question:   Reason for Exam additional comments    Answer:   MRI to assess treatment response for neo-adjuvant chemotherapy  . MR BREAST BILATERAL W WO CONTRAST    Standing Status:   Future    Standing Expiration Date:   09/09/2018    Order Specific Question:   If indicated for the ordered procedure, I authorize the administration  of contrast media per Radiology protocol    Answer:   Yes    Order Specific Question:   What is the patient's sedation requirement?    Answer:   No Sedation    Order Specific Question:   Does the patient have a pacemaker or implanted devices?    Answer:   No    Order Specific Question:   Radiology Contrast Protocol - do NOT remove file path    Answer:   \\[email or web address]    Order Specific Question:   Reason for Exam additional comments    Answer:   MRI to assess treatment response for neo-adjuvant chemotherapy; Breast cancer, post initiation or completion of neoadjuvant chemo    Order Specific Question:   Preferred imaging location?    Answer:   [facility] (table limit-550lbs)  . CBC with Differential    Standing Status:   Future    Standing Expiration Date:   07/10/2018  . Comprehensive metabolic panel    Standing Status:   Future    Standing Expiration Date:   07/10/2018  . Magnesium    Standing Status:   Future    Standing Expiration Date:   07/10/2018    Cancer Staging Malignant neoplasm of upper-inner quadrant of right breast in female, estrogen receptor positive ([place]) Staging form: Breast, AJCC 8th Edition - Clinical stage from 04/22/2017: Stage IB (cT2, cN0, cM0, G3, ER: Positive, PR: Positive, HER2: Positive) - Unsigned Staging comments: Staged at breast conference on 7.11.18   All  questions were answered. . The patient knows to call the clinic with any problems, questions or concerns.  This note was electronically signed.    History of Presenting Illness Renee Harris 56 y.o. presenting to the [facility] for Continued treatment adjuvant TCH-P [place] immunotherapy for diagnosis of stage IIB invasive ductal carcinoma, triple positive morphology. Patient had an episode of nosebleeds over several days which resolved spontaneously and has not recurred so far. As any shortness of breath, chest pain or cough. Denies any swelling in the lower  extremities. Uncontrolled nausea. No breakthrough fevers, chills, night sweats.  Oncological/hematological History:   Malignant neoplasm of upper-inner quadrant of right breast in female, estrogen receptor positive ([place])   04/13/2017 Initial Diagnosis    Screening detected right breast mass with calcifications 2.1 cm in size and 3.4 cm in size. Calcifications were fibroadenoma. Right breast biopsy upper inner quadrant 1:00 mass: IDC with DCIS with necrosis, grade 3, ER 100%, PR 70%, Ki-67 60%, HER-2 positive ratio 2.34, T2 N1 stage IB (New AJCC)      04/25/2017 Breast MRI    Right breast upper inner quadrant 2.2 x 1.7 x 2.7 cm mass, abnormal area of clumped non-mass enhancement in the right lateral breast 3.2 x 1.9 x 1 cm, too abnormal lymph nodes right axilla 4.3 and 1.7 cm (biopsy-proven breast cancer)       05/08/2017 Procedure    Right axilla lymph node biopsy: Metastatic carcinoma      05/13/2017 Procedure    Right breast biopsy retroareolar region: Intraductal papilloma       Medical History: Past Medical History:  Diagnosis Date  . Asthma   . Blood transfusion    as child  . Diabetes ([place])    type 2   . Heart murmur    d/t aortic stenosis   . History of kidney stones   . Hyperlipidemia   . Hypertension   . Normal echocardiogram   . Sleep apnea     Surgical History: Past Surgical History:  Procedure Laterality Date  . ABDOMINAL HYSTERECTOMY  2004  . CHOLECYSTECTOMY  05/30/2011   Procedure: LAPAROSCOPIC CHOLECYSTECTOMY;  Surgeon: Donato Heinz;  Location: [facility];  Service: General;  Laterality: N/A;  . 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 KIDNEY STONE SURGERY     2017 2-23  . PORTACATH PLACEMENT N/A 05/25/2017   Procedure: INSERTION PORT-A-CATH;  Surgeon: Alphonsa Overall, MD;  Location: [facility];  Service: General;  Laterality: N/A;    Family History: Family History  Problem Relation Age of Onset  . Lung cancer Maternal Grandmother   .  Anesthesia problems Neg Hx   . Hypotension Neg Hx   . Malignant hyperthermia Neg Hx   . Pseudochol deficiency Neg Hx     Social History: Social History   Social History  . Marital status: Married    Spouse name: N/A  . Number of children: N/A  . Years of education: N/A   Occupational History  . Not on file.   Social History Main Topics  . Smoking status: Never Smoker  . Smokeless tobacco: Never Used  . Alcohol use Yes     Comment: 2 x year  . Drug use: No  . Sexual activity: Yes    Birth control/ protection: Surgical   Other Topics Concern  . Not on file   Social History Narrative  . No narrative on file    Allergies: Allergies  Allergen Reactions  . Latex Itching  . Pyridostigmine Bromide Other (See Comments)    Makes muscles twitch  . Statins Nausea Only    Medications:  Current Outpatient Prescriptions  Medication Sig Dispense Refill  . albuterol (PROVENTIL HFA;VENTOLIN HFA) 108 (90 BASE) MCG/ACT inhaler Inhale 1 puff into the lungs every 6 (six) hours as needed for wheezing or shortness of breath.     . allopurinol (ZYLOPRIM) 300 MG tablet Take 300 mg by mouth at bedtime.     . Black Cohosh 40 MG CAPS Take 40 mg by mouth at bedtime.     Marland Kitchen dexamethasone (DECADRON) 4 MG tablet Take 1 tablet (4 mg total) by mouth daily. 1 tab day before chemo and 1 tab day after 12 tablet 0  . diphenhydrAMINE (BENADRYL) 25 mg capsule Take 25 mg by mouth at bedtime as needed for allergies. For allergies     . diphenoxylate-atropine (LOMOTIL) 2.5-0.025 MG tablet TAKE (1) TABLET BY MOUTH (4) TIMES DAILY AS NEEDED FOR DIARRHEA OR LOOSE STOOLS. 45 tablet 0  . escitalopram (LEXAPRO) 20 MG tablet Take 20 mg by mouth at bedtime.     . fenofibrate 160 MG tablet Take 160 mg by mouth at bedtime.     . hydrochlorothiazide (HYDRODIURIL) 12.5 MG tablet Take 12.5 mg by mouth at bedtime.     Marland Kitchen HYDROcodone-acetaminophen (NORCO/VICODIN) 5-325 MG tablet Take 1 tablet by mouth every 6 (six) hours  as needed for moderate pain. 15 tablet 0  . ibuprofen (ADVIL,MOTRIN) 200 MG tablet Take 200 mg by mouth every 8 (eight) hours as needed for headache or mild pain.    Marland Kitchen lidocaine-prilocaine (EMLA) cream Apply to affected area once 30 g 3  . losartan (COZAAR) 50 MG tablet Take 50 mg by mouth at bedtime.     . magic mouthwash w/lidocaine SOLN Take 5 mLs by mouth 4 (four) times daily as needed for mouth pain. 360 mL 1  . metFORMIN (GLUCOPHAGE) 500 MG tablet Take 500 mg by mouth 2 (two) times daily with a meal.    . Multiple Vitamin (MULTIVITAMIN) tablet Take 1 tablet by mouth daily.    Marland Kitchen omeprazole (PRILOSEC) 40 MG capsule Take 1 capsule (40 mg total) by mouth daily. 30 capsule 3  . ondansetron (ZOFRAN) 8 MG tablet Take 1 tablet (8 mg total) by mouth 2 (two) times daily as needed for refractory nausea / vomiting. Start on day 3 after chemo. 30 tablet 1  . prochlorperazine (COMPAZINE) 10 MG tablet Take 1 tablet (10 mg total) by mouth every 6 (six) hours as needed (Nausea or vomiting). 30 tablet 1  . zolpidem (AMBIEN) 10 MG tablet Take 1 tablet (10 mg total) by mouth at bedtime as needed for sleep. 30 tablet 0   No current facility-administered medications for this visit.    Facility-Administered Medications Ordered in Other Visits  Medication Dose Route Frequency Provider Last Rate Last Dose  . 0.9 %  sodium chloride infusion   Intravenous Continuous Ralene Cork, MD   Stopped at 07/17/17 1445    Review of Systems: Review of Systems  HENT:   Positive for nosebleeds.   Gastrointestinal: Positive for diarrhea.  All other systems reviewed and are negative.    PHYSICAL EXAMINATION There were no vitals taken for this visit.  ECOG PERFORMANCE STATUS: 1 - Symptomatic but completely ambulatory  Physical Exam  Constitutional: She is oriented to person, place, and time and well-developed, well-nourished, and in no distress.  HENT:  Head: Normocephalic.  Mouth/Throat: Oropharynx is clear and  moist. No oropharyngeal exudate.  Eyes: Pupils are equal, round, and reactive to light. Conjunctivae are normal. No scleral icterus.  Neck: Normal range of motion. Neck supple.  Cardiovascular: Regular rhythm.   Mild tachycardia  Pulmonary/Chest: Effort normal and breath sounds normal. No respiratory distress.  Abdominal: Soft. Bowel sounds are normal. There is no tenderness.  Musculoskeletal: Normal range of motion. She exhibits edema (Trace ankle edema).  Lymphadenopathy:    She has no cervical adenopathy.       Right: No supraclavicular adenopathy present.       Left: No supraclavicular adenopathy present.  Neurological: She is alert and oriented to person, place, and time. No cranial nerve deficit.  Skin: Skin is warm and dry. No rash noted.  Psychiatric: Mood, memory, affect and judgment normal.  Nursing note and vitals reviewed.    LABORATORY DATA: I have personally reviewed the data as listed: Infusion on 07/10/2017  Component Date Value Ref Range Status  . Sodium 07/10/2017 141  135 - 145 mmol/L Final  . Potassium 07/10/2017 3.5  3.5 - 5.1 mmol/L Final  . Chloride 07/10/2017 106  101 - 111 mmol/L Final  . CO2 07/10/2017 26  22 - 32 mmol/L Final  . Glucose, Bld 07/10/2017 129* 65 - 99 mg/dL Final  . BUN 07/10/2017 15  6 - 20 mg/dL Final  . Creatinine, Ser 07/10/2017 0.82  0.44 - 1.00 mg/dL Final  . Calcium 07/10/2017 9.7  8.9 - 10.3 mg/dL Final  . Total Protein 07/10/2017 6.2* 6.5 - 8.1 g/dL Final  . Albumin 07/10/2017 3.6  3.5 - 5.0 g/dL Final  . AST 07/10/2017 35  15 - 41 U/L Final  . ALT 07/10/2017 48  14 - 54 U/L Final  . Alkaline Phosphatase 07/10/2017 68  38 - 126 U/L Final  . Total Bilirubin 07/10/2017 0.6  0.3 - 1.2 mg/dL Final  . GFR calc non Af Amer 07/10/2017 >60  >60 mL/min Final  . GFR calc Af Amer 07/10/2017 >60  >60 mL/min Final   Comment: (NOTE) The eGFR has been calculated using the CKD EPI equation. This calculation has not been validated in all  clinical situations. eGFR's persistently <60 mL/min signify possible Chronic Kidney Disease.   . Anion gap 07/10/2017 9  5 - 15 Final  . WBC 07/10/2017 4.9  4.0 - 10.5 K/uL Final  . RBC 07/10/2017 2.94* 3.87 - 5.11 MIL/uL Final  . Hemoglobin 07/10/2017 8.9* 12.0 - 15.0 g/dL Final  . HCT 07/10/2017 26.6* 36.0 - 46.0 % Final  . MCV 07/10/2017 90.5  78.0 - 100.0 fL Final  . MCH 07/10/2017 30.3  26.0 - 34.0 pg Final  . MCHC 07/10/2017 33.5  30.0 - 36.0 g/dL Final  . RDW 07/10/2017 21.1* 11.5 - 15.5 % Final  . Platelets 07/10/2017 110* 150 - 400 K/uL Final   Comment: PLATELET COUNT CONFIRMED BY SMEAR SPECIMEN CHECKED FOR CLOTS   . Neutrophils Relative % 07/10/2017 53  % Final  . Lymphocytes Relative 07/10/2017 35  % Final  . Monocytes Relative 07/10/2017 12  % Final  . Eosinophils Relative 07/10/2017 0  % Final  . Basophils Relative 07/10/2017 0  % Final  . Neutro Abs 07/10/2017 2.6  1.7 - 7.7 K/uL Final  . Lymphs Abs 07/10/2017 1.7  0.7 - 4.0 K/uL Final  . Monocytes Absolute 07/10/2017 0.6  0.1 - 1.0 K/uL Final  . Eosinophils Absolute 07/10/2017 0.0  0.0 - 0.7 K/uL Final  .  Basophils Absolute 07/10/2017 0.0  0.0 - 0.1 K/uL Final  . RBC Morphology 07/10/2017 ANISOCYTES   Final       Ardath Sax, MD

## 2017-07-17 NOTE — Assessment & Plan Note (Signed)
56 y.o. female with diagnosis of stage IIB hormone-receptor positive HER-2-positive carcinoma of the breast undergoing neoadjuvant chemotherapy immunotherapy with TCHP combination. Tolerating therapy reasonably well with grade 2 diarrhea and grade 1 lower extremity edema.  Diarrhea appears to be improving at this time. Patient had mild nasal bleeding which was self-contained. Clinical evaluation and lab work today permissive to proceed with the third cycle of chemoimmunotherapy as scheduled.  Plan:  --Proceed with TCHP Cycle #3 today --MRI Breast prior to RTC --ECHO prior to RTC --RTC 3 weeks: labs, clinic visit, possible Cycle #4 of TCHP

## 2017-07-17 NOTE — Progress Notes (Signed)
Tolerated infusion w/o adverse reaction.  Alert, in no distress.  VSS.  Discharged ambulatory in c/o family.  

## 2017-07-20 ENCOUNTER — Encounter (HOSPITAL_BASED_OUTPATIENT_CLINIC_OR_DEPARTMENT_OTHER): Payer: 59

## 2017-07-20 ENCOUNTER — Encounter (HOSPITAL_COMMUNITY): Payer: Self-pay

## 2017-07-20 DIAGNOSIS — C50211 Malignant neoplasm of upper-inner quadrant of right female breast: Secondary | ICD-10-CM

## 2017-07-20 DIAGNOSIS — C773 Secondary and unspecified malignant neoplasm of axilla and upper limb lymph nodes: Secondary | ICD-10-CM | POA: Diagnosis not present

## 2017-07-20 MED ORDER — HEPARIN SOD (PORK) LOCK FLUSH 100 UNIT/ML IV SOLN
500.0000 [IU] | Freq: Once | INTRAVENOUS | Status: AC
  Administered 2017-07-20: 500 [IU] via INTRAVENOUS

## 2017-07-20 MED ORDER — SODIUM CHLORIDE 0.9 % IV SOLN
INTRAVENOUS | Status: DC
  Administered 2017-07-20: 13:00:00 via INTRAVENOUS

## 2017-07-20 NOTE — Patient Instructions (Signed)
[place] at [facility] Discharge Instructions  RECOMMENDATIONS MADE BY THE CONSULTANT AND ANY TEST RESULTS WILL BE SENT TO YOUR REFERRING PHYSICIAN.  One liter of fluids given today Follow up as scheduled.  Thank you for choosing [place] at [facility] to provide your oncology and hematology care.  To afford each patient quality time with our provider, please arrive at least 15 minutes before your scheduled appointment time.    If you have a lab appointment with the [place] please come in thru the  Main Entrance and check in at the main information desk  You need to re-schedule your appointment should you arrive 10 or more minutes late.  We strive to give you quality time with our providers, and arriving late affects you and other patients whose appointments are after yours.  Also, if you no show three or more times for appointments you may be dismissed from the clinic at the providers discretion.     Again, thank you for choosing [facility].  Our hope is that these requests will decrease the amount of time that you wait before being seen by our physicians.       _____________________________________________________________  Should you have questions after your visit to [facility], please contact our office at [phone] between the hours of 8:30 a.m. and 4:30 p.m.  Voicemails left after 4:30 p.m. will not be returned until the following business day.  For prescription refill requests, have your pharmacy contact our office.       Resources For Cancer Patients and their Caregivers ? American Cancer Society: Can assist with transportation, wigs, general needs, runs Look Good Feel Better.        639-874-5980 ? Cancer Care: Provides financial assistance, online support groups, medication/co-pay assistance.  1-800-813-HOPE 657-661-3653) ? [place] Assists [place] cancer  patients and their families through emotional , educational and financial support.  908-335-0431 ? [place] DSS Where to apply for food stamps, Medicaid and utility assistance. (848) 588-6192 ? RCATS: Transportation to medical appointments. 228-348-7091 ? Social Security Administration: May apply for disability if have a Stage IV cancer. ([phone] [phone] 4054360838 ? [employer], Disability and Transit Services: Assists with nutrition, care and transit needs. [place] Support Programs: @10RELATIVEDAYS @ > Cancer Support Group  2nd Tuesday of the month 1pm-2pm, Journey Room  > Creative Journey  3rd Tuesday of the month 1130am-1pm, Journey Room  > Look Good Feel Better  1st Wednesday of the month 10am-12 noon, Journey Room (Call [place] to register [phone])

## 2017-07-20 NOTE — Progress Notes (Signed)
Treatment given per orders. Patient tolerated it well without problems. Vitals stable and discharged home from clinic ambulatory. Follow up as scheduled.  

## 2017-07-27 ENCOUNTER — Ambulatory Visit (HOSPITAL_COMMUNITY)
Admission: RE | Admit: 2017-07-27 | Discharge: 2017-07-27 | Disposition: A | Discharge status: Home / Self Care | Payer: 59 | Source: Ambulatory Visit | Attending: Hematology and Oncology | Admitting: Hematology and Oncology

## 2017-07-27 ENCOUNTER — Other Ambulatory Visit (HOSPITAL_COMMUNITY): Payer: Self-pay | Admitting: Emergency Medicine

## 2017-07-27 DIAGNOSIS — C50211 Malignant neoplasm of upper-inner quadrant of right female breast: Secondary | ICD-10-CM

## 2017-07-27 DIAGNOSIS — Z170 Estrogen receptor positive status [ER+]: Principal | ICD-10-CM

## 2017-07-27 MED ORDER — GADOBENATE DIMEGLUMINE 529 MG/ML IV SOLN: 20.0000 mL | Freq: Once | INTRAVENOUS | Status: DC | PRN

## 2017-07-28 ENCOUNTER — Other Ambulatory Visit (HOSPITAL_COMMUNITY): Payer: Self-pay | Admitting: Oncology

## 2017-07-28 DIAGNOSIS — C50211 Malignant neoplasm of upper-inner quadrant of right female breast: Secondary | ICD-10-CM

## 2017-07-28 DIAGNOSIS — Z170 Estrogen receptor positive status [ER+]: Principal | ICD-10-CM

## 2017-07-31 ENCOUNTER — Encounter (HOSPITAL_COMMUNITY): Payer: Self-pay | Admitting: Adult Health

## 2017-07-31 ENCOUNTER — Encounter (HOSPITAL_BASED_OUTPATIENT_CLINIC_OR_DEPARTMENT_OTHER): Payer: 59 | Admitting: Adult Health

## 2017-07-31 ENCOUNTER — Encounter (HOSPITAL_BASED_OUTPATIENT_CLINIC_OR_DEPARTMENT_OTHER): Payer: 59

## 2017-07-31 VITALS — BP 118/66 | HR 66 | Temp 98.6°F | Resp 16

## 2017-07-31 DIAGNOSIS — D649 Anemia, unspecified: Secondary | ICD-10-CM | POA: Diagnosis not present

## 2017-07-31 DIAGNOSIS — E119 Type 2 diabetes mellitus without complications: Secondary | ICD-10-CM | POA: Diagnosis not present

## 2017-07-31 DIAGNOSIS — G4700 Insomnia, unspecified: Secondary | ICD-10-CM | POA: Diagnosis not present

## 2017-07-31 DIAGNOSIS — D696 Thrombocytopenia, unspecified: Secondary | ICD-10-CM | POA: Diagnosis not present

## 2017-07-31 DIAGNOSIS — Z9221 Personal history of antineoplastic chemotherapy: Secondary | ICD-10-CM | POA: Diagnosis not present

## 2017-07-31 DIAGNOSIS — E785 Hyperlipidemia, unspecified: Secondary | ICD-10-CM | POA: Diagnosis not present

## 2017-07-31 DIAGNOSIS — I10 Essential (primary) hypertension: Secondary | ICD-10-CM | POA: Diagnosis not present

## 2017-07-31 DIAGNOSIS — K521 Toxic gastroenteritis and colitis: Secondary | ICD-10-CM

## 2017-07-31 DIAGNOSIS — R634 Abnormal weight loss: Secondary | ICD-10-CM | POA: Diagnosis not present

## 2017-07-31 DIAGNOSIS — R197 Diarrhea, unspecified: Secondary | ICD-10-CM | POA: Diagnosis not present

## 2017-07-31 DIAGNOSIS — Z90710 Acquired absence of both cervix and uterus: Secondary | ICD-10-CM | POA: Diagnosis not present

## 2017-07-31 DIAGNOSIS — R0609 Other forms of dyspnea: Secondary | ICD-10-CM | POA: Diagnosis not present

## 2017-07-31 DIAGNOSIS — Z79899 Other long term (current) drug therapy: Secondary | ICD-10-CM | POA: Diagnosis not present

## 2017-07-31 DIAGNOSIS — C50211 Malignant neoplasm of upper-inner quadrant of right female breast: Secondary | ICD-10-CM | POA: Diagnosis present

## 2017-07-31 DIAGNOSIS — Z7984 Long term (current) use of oral hypoglycemic drugs: Secondary | ICD-10-CM | POA: Diagnosis not present

## 2017-07-31 DIAGNOSIS — Z801 Family history of malignant neoplasm of trachea, bronchus and lung: Secondary | ICD-10-CM | POA: Diagnosis not present

## 2017-07-31 DIAGNOSIS — Z170 Estrogen receptor positive status [ER+]: Secondary | ICD-10-CM

## 2017-07-31 DIAGNOSIS — Z87442 Personal history of urinary calculi: Secondary | ICD-10-CM | POA: Diagnosis not present

## 2017-07-31 DIAGNOSIS — Z9049 Acquired absence of other specified parts of digestive tract: Secondary | ICD-10-CM | POA: Diagnosis not present

## 2017-07-31 DIAGNOSIS — C773 Secondary and unspecified malignant neoplasm of axilla and upper limb lymph nodes: Secondary | ICD-10-CM | POA: Diagnosis not present

## 2017-07-31 LAB — CBC WITH DIFFERENTIAL/PLATELET
BASOS PCT: 0 %
Basophils Absolute: 0 10*3/uL (ref 0.0–0.1)
EOS ABS: 0 10*3/uL (ref 0.0–0.7)
EOS PCT: 0 %
HCT: 24.7 % — ABNORMAL LOW (ref 36.0–46.0)
Hemoglobin: 8.1 g/dL — ABNORMAL LOW (ref 12.0–15.0)
LYMPHS ABS: 1.4 10*3/uL (ref 0.7–4.0)
LYMPHS PCT: 33 %
MCH: 32 pg (ref 26.0–34.0)
MCHC: 32.8 g/dL (ref 30.0–36.0)
MCV: 97.6 fL (ref 78.0–100.0)
MONO ABS: 0.5 10*3/uL (ref 0.1–1.0)
MONOS PCT: 12 %
NEUTROS ABS: 2.4 10*3/uL (ref 1.7–7.7)
Neutrophils Relative %: 55 %
PLATELETS: 71 10*3/uL — AB (ref 150–400)
RBC: 2.53 MIL/uL — ABNORMAL LOW (ref 3.87–5.11)
RDW: 24 % — ABNORMAL HIGH (ref 11.5–15.5)
WBC: 4.3 10*3/uL (ref 4.0–10.5)

## 2017-07-31 LAB — COMPREHENSIVE METABOLIC PANEL
ALT: 29 U/L (ref 14–54)
ANION GAP: 8 (ref 5–15)
AST: 24 U/L (ref 15–41)
Albumin: 3.8 g/dL (ref 3.5–5.0)
Alkaline Phosphatase: 65 U/L (ref 38–126)
BUN: 15 mg/dL (ref 6–20)
CALCIUM: 9.6 mg/dL (ref 8.9–10.3)
CHLORIDE: 107 mmol/L (ref 101–111)
CO2: 25 mmol/L (ref 22–32)
CREATININE: 0.89 mg/dL (ref 0.44–1.00)
Glucose, Bld: 151 mg/dL — ABNORMAL HIGH (ref 65–99)
Potassium: 3.7 mmol/L (ref 3.5–5.1)
SODIUM: 140 mmol/L (ref 135–145)
Total Bilirubin: 0.8 mg/dL (ref 0.3–1.2)
Total Protein: 6.6 g/dL (ref 6.5–8.1)

## 2017-07-31 LAB — MAGNESIUM: MAGNESIUM: 1.5 mg/dL — AB (ref 1.7–2.4)

## 2017-07-31 LAB — PREPARE RBC (CROSSMATCH)

## 2017-07-31 LAB — ABO/RH: ABO/RH(D): A POS

## 2017-07-31 MED ORDER — MAGNESIUM SULFATE 2 GM/50ML IV SOLN
2.0000 g | Freq: Once | INTRAVENOUS | Status: AC
  Administered 2017-07-31: 2 g via INTRAVENOUS
  Filled 2017-07-31: qty 50

## 2017-07-31 MED ORDER — ACETAMINOPHEN 325 MG PO TABS
650.0000 mg | ORAL_TABLET | Freq: Once | ORAL | Status: AC
  Administered 2017-07-31: 650 mg via ORAL
  Filled 2017-07-31: qty 2

## 2017-07-31 MED ORDER — HEPARIN SOD (PORK) LOCK FLUSH 100 UNIT/ML IV SOLN
500.0000 [IU] | Freq: Every day | INTRAVENOUS | Status: AC | PRN
  Administered 2017-07-31: 500 [IU]
  Filled 2017-07-31: qty 5

## 2017-07-31 MED ORDER — SODIUM CHLORIDE 0.9% FLUSH
10.0000 mL | INTRAVENOUS | Status: AC | PRN
  Administered 2017-07-31: 10 mL

## 2017-07-31 MED ORDER — DIPHENHYDRAMINE HCL 25 MG PO CAPS
25.0000 mg | ORAL_CAPSULE | Freq: Once | ORAL | Status: AC
  Administered 2017-07-31: 25 mg via ORAL
  Filled 2017-07-31: qty 1

## 2017-07-31 MED ORDER — SODIUM CHLORIDE 0.9 % IV SOLN
250.0000 mL | Freq: Once | INTRAVENOUS | Status: AC
  Administered 2017-07-31: 250 mL via INTRAVENOUS

## 2017-07-31 MED ORDER — SODIUM CHLORIDE 0.9 % IV SOLN
INTRAVENOUS | Status: DC
  Administered 2017-07-31: 10:00:00 via INTRAVENOUS

## 2017-07-31 NOTE — Progress Notes (Signed)
Will hold treatment today per MD due to abnormal labs.  One units of blood given today . One unit of fluids given per orders. Magnesium given as ordered.   Treatment given per orders. Patient tolerated it well without problems. Vitals stable and discharged home from clinic ambulatory. Follow up as scheduled.

## 2017-07-31 NOTE — Progress Notes (Signed)
[place], [place] [address]   CLINIC:  Medical Oncology/Hematology  PCP:  Sharilyn Sites, MD [place] [address] [phone]   REASON FOR VISIT:  Follow-up for Stage IIB invasive ductal carcinoma of (R) breast; ER+/PR+/HER2+  CURRENT THERAPY: Neoadjuvant Taxotere/Carbo/Herceptin/Perjeta every 3 weeks, beginning 05/29/17   BRIEF ONCOLOGIC HISTORY:    Malignant neoplasm of upper-inner quadrant of right breast in female, estrogen receptor positive ([place])   04/13/2017 Initial Diagnosis    Screening detected right breast mass with calcifications 2.1 cm in size and 3.4 cm in size. Calcifications were fibroadenoma. Right breast biopsy upper inner quadrant 1:00 mass: IDC with DCIS with necrosis, grade 3, ER 100%, PR 70%, Ki-67 60%, HER-2 positive ratio 2.34, T2 N1 stage IB (New AJCC)      04/25/2017 Breast MRI    Right breast upper inner quadrant 2.2 x 1.7 x 2.7 cm mass, abnormal area of clumped non-mass enhancement in the right lateral breast 3.2 x 1.9 x 1 cm, too abnormal lymph nodes right axilla 4.3 and 1.7 cm (biopsy-proven breast cancer)       05/08/2017 Procedure    Right axilla lymph node biopsy: Metastatic carcinoma      05/13/2017 Procedure    Right breast biopsy retroareolar region: Intraductal papilloma         INTERVAL HISTORY:  Ms. Orama 56 y.o. female returns for routine follow-up for right breast cancer.    Due for cycle #4 TCHP chemo today.   Overall, she tells me she has been feeling okay since her last cycle of chemo. Her appetite and energy levels are both 75%. She does feel like "I get tired faster than normal, especially when I exert myself."  Some shortness of breath with exertion as well, which has worsened in past week or so.  Diarrhea, GERD, and N&V better controlled on current medication regimen including Imodium/Lomotil, Prilosec, & Zofran/Compazine.   She last saw Dr. Lebron Conners (med onc) for  follow-up visit on 07/10/17; he recommended MRI breasts to evaluate disease response thus far with neoadjuvant chemotherapy. Unfortunately, patient's body habitus prohibited her from completing this imaging.    Her next ECHO is scheduled for 08/10/17.   Overall, she tells me that she feels well enough for her next round of chemo. Due for cycle #4 TCHP today.     REVIEW OF SYSTEMS:  Review of Systems  Constitutional: Positive for fatigue. Negative for chills and fever.  HENT:  Negative.   Eyes: Negative.   Respiratory: Positive for shortness of breath. Negative for cough.   Cardiovascular: Negative.   Gastrointestinal: Negative for abdominal pain and blood in stool.  Genitourinary: Negative.  Negative for dysuria, hematuria and vaginal bleeding.   Musculoskeletal: Negative.   Skin: Negative.   Neurological: Negative for headaches.  Hematological: Bruises/bleeds easily.  Psychiatric/Behavioral: Negative.      PAST MEDICAL/SURGICAL HISTORY:  Past Medical History:  Diagnosis Date  . Asthma   . Blood transfusion    as child  . Diabetes ([place])    type 2   . Heart murmur    d/t aortic stenosis   . History of kidney stones   . Hyperlipidemia   . Hypertension   . Normal echocardiogram   . Sleep apnea    Past Surgical History:  Procedure Laterality Date  . ABDOMINAL HYSTERECTOMY  2004  . CHOLECYSTECTOMY  05/30/2011   Procedure: LAPAROSCOPIC CHOLECYSTECTOMY;  Surgeon: Donato Heinz;  Location: [facility];  Service: General;  Laterality: N/A;  . 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 KIDNEY STONE SURGERY     2017 2-23  . PORTACATH PLACEMENT N/A 05/25/2017   Procedure: INSERTION PORT-A-CATH;  Surgeon: Alphonsa Overall, MD;  Location: [facility];  Service: General;  Laterality: N/A;     SOCIAL HISTORY:  Social History   Social History  . Marital status: Married    Spouse name: N/A  . Number of children: N/A  . Years of education: N/A   Occupational History   . Not on file.   Social History Main Topics  . Smoking status: Never Smoker  . Smokeless tobacco: Never Used  . Alcohol use Yes     Comment: 2 x year  . Drug use: No  . Sexual activity: Yes    Birth control/ protection: Surgical   Other Topics Concern  . Not on file   Social History Narrative  . No narrative on file    FAMILY HISTORY:  Family History  Problem Relation Age of Onset  . Lung cancer Maternal Grandmother   . Anesthesia problems Neg Hx   . Hypotension Neg Hx   . Malignant hyperthermia Neg Hx   . Pseudochol deficiency Neg Hx     CURRENT MEDICATIONS:  Outpatient Encounter Prescriptions as of 07/31/2017  Medication Sig Note  . albuterol (PROVENTIL HFA;VENTOLIN HFA) 108 (90 BASE) MCG/ACT inhaler Inhale 1 puff into the lungs every 6 (six) hours as needed for wheezing or shortness of breath.    . allopurinol (ZYLOPRIM) 300 MG tablet Take 300 mg by mouth at bedtime.    . Black Cohosh 40 MG CAPS Take 40 mg by mouth at bedtime.    Marland Kitchen dexamethasone (DECADRON) 4 MG tablet Take 1 tablet (4 mg total) by mouth daily. 1 tab day before chemo and 1 tab day after 05/20/2017: Will start when starts chemtherapy   . diphenhydrAMINE (BENADRYL) 25 mg capsule Take 25 mg by mouth at bedtime as needed for allergies. For allergies    . diphenoxylate-atropine (LOMOTIL) 2.5-0.025 MG tablet TAKE (1) TABLET BY MOUTH (4) TIMES DAILY AS NEEDED FOR DIARRHEA OR LOOSE STOOLS.   Marland Kitchen escitalopram (LEXAPRO) 20 MG tablet Take 20 mg by mouth at bedtime.    . fenofibrate 160 MG tablet Take 160 mg by mouth at bedtime.    . hydrochlorothiazide (HYDRODIURIL) 12.5 MG tablet Take 12.5 mg by mouth at bedtime.    Marland Kitchen HYDROcodone-acetaminophen (NORCO/VICODIN) 5-325 MG tablet Take 1 tablet by mouth every 6 (six) hours as needed for moderate pain.   Marland Kitchen ibuprofen (ADVIL,MOTRIN) 200 MG tablet Take 200 mg by mouth every 8 (eight) hours as needed for headache or mild pain.   Marland Kitchen lidocaine (XYLOCAINE) 2 % solution    .  lidocaine-prilocaine (EMLA) cream Apply to affected area once 05/20/2017: Will start when starts chemtherapy   . losartan (COZAAR) 50 MG tablet Take 50 mg by mouth at bedtime.    . magic mouthwash w/lidocaine SOLN Take 5 mLs by mouth 4 (four) times daily as needed for mouth pain.   . metFORMIN (GLUCOPHAGE) 500 MG tablet Take 500 mg by mouth 2 (two) times daily with a meal.   . Multiple Vitamin (MULTIVITAMIN) tablet Take 1 tablet by mouth daily.   Marland Kitchen omeprazole (PRILOSEC) 40 MG capsule Take 1 capsule (40 mg total) by mouth daily.   . ondansetron (ZOFRAN) 8 MG tablet Take 1 tablet (8 mg total) by mouth  2 (two) times daily as needed for refractory nausea / vomiting. Start on day 3 after chemo. 05/20/2017: Will start when starts chemtherapy  . [DISCONTINUED] prochlorperazine (COMPAZINE) 10 MG tablet Take 1 tablet (10 mg total) by mouth every 6 (six) hours as needed (Nausea or vomiting). 05/20/2017: Will start when starts chemtherapy  . zolpidem (AMBIEN) 10 MG tablet Take 1 tablet (10 mg total) by mouth at bedtime as needed for sleep.    No facility-administered encounter medications on file as of 07/31/2017.     ALLERGIES:  Allergies  Allergen Reactions  . Latex Itching  . Pyridostigmine Bromide Other (See Comments)    Makes muscles twitch  . Statins Nausea Only     PHYSICAL EXAM:  ECOG Performance status: 1-2 - Symptomatic; requires occasional assistance.      Physical Exam  Constitutional: She is oriented to person, place, and time and well-developed, well-nourished, and in no distress.  Seen in chemo chair in infusion area   HENT:  Head: Normocephalic.  Mouth/Throat: Oropharynx is clear and moist.  Eyes: Conjunctivae are normal. No scleral icterus.  Neck: Normal range of motion. Neck supple.  Cardiovascular: Normal rate and regular rhythm.   Pulmonary/Chest: Effort normal and breath sounds normal. No respiratory distress.  Abdominal: Soft. Bowel sounds are normal. There is no  tenderness.  Musculoskeletal: Normal range of motion. She exhibits no edema.  Lymphadenopathy:    She has no cervical adenopathy.       Right: No supraclavicular adenopathy present.       Left: No supraclavicular adenopathy present.  Neurological: She is alert and oriented to person, place, and time. No cranial nerve deficit.  Skin: Skin is warm and dry. No rash noted. There is pallor.  Psychiatric: Mood, memory, affect and judgment normal.  Nursing note and vitals reviewed.    LABORATORY DATA:  I have reviewed the labs as listed.  CBC    Component Value Date/Time   WBC 5.2 08/07/2017 1021   RBC 3.44 (L) 08/07/2017 1021   HGB 10.9 (L) 08/07/2017 1021   HGB 13.8 04/22/2017 0831   HCT 33.7 (L) 08/07/2017 1021   HCT 41.9 04/22/2017 0831   PLT 157 08/07/2017 1021   PLT 184 04/22/2017 0831   MCV 98.0 08/07/2017 1021   MCV 85.7 04/22/2017 0831   MCH 31.7 08/07/2017 1021   MCHC 32.3 08/07/2017 1021   RDW 18.3 (H) 08/07/2017 1021   RDW 13.9 04/22/2017 0831   LYMPHSABS 1.0 08/07/2017 1021   LYMPHSABS 2.1 04/22/2017 0831   MONOABS 0.3 08/07/2017 1021   MONOABS 0.5 04/22/2017 0831   EOSABS 0.1 08/07/2017 1021   EOSABS 0.3 04/22/2017 0831   BASOSABS 0.0 08/07/2017 1021   BASOSABS 0.0 04/22/2017 0831   CMP Latest Ref Rng & Units 08/07/2017 07/31/2017 07/10/2017  Glucose 65 - 99 mg/dL 241(H) 151(H) 129(H)  BUN 6 - 20 mg/dL _0 Creatinine 0.44 - 1.00 mg/dL 0.90 0.89 0.82  Sodium 135 - 145 mmol/L 137 140 141  Potassium 3.5 - 5.1 mmol/L 4.3 3.7 3.5  Chloride 101 - 111 mmol/L 104 107 106  CO2 22 - 32 mmol/L _1 Calcium 8.9 - 10.3 mg/dL 10.1 9.6 9.7  Total Protein 6.5 - 8.1 g/dL 7.1 6.6 6.2(L)  Total Bilirubin 0.3 - 1.2 mg/dL 0.6 0.8 0.6  Alkaline Phos 38 - 126 U/L 60 65 68  AST 15 - 41 U/L 25 24 35  ALT 14 - 54 U/L 25 29 48  PENDING LABS:    DIAGNOSTIC IMAGING:  *The following radiologic images and reports have been reviewed independently and agree with below  findings.  Mammogram: 04/07/17 CLINICAL DATA:  Screening recall for right breast mass and calcifications. In addition, the patient states she now feels a palpable abnormality in her upper right breast since her recent screening mammogram.  EXAM: 2D DIGITAL DIAGNOSTIC UNILATERAL RIGHT MAMMOGRAM WITH CAD AND ADJUNCT TOMO  RIGHT BREAST ULTRASOUND  COMPARISON:  Screening mammogram dated 03/23/2017.  ACR Breast Density Category b: There are scattered areas of fibroglandular density.  FINDINGS: Cc and MLO tomograms of the right breast as well as spot compression magnification views of the right breast were performed. There is an irregular spiculated mass with associated microcalcifications in the superior right breast measuring approximately 2.1 cm. Spot compression magnification views of the retroareolar right breast demonstrate loosely grouped predominantly coarse calcifications, however some of which are amorphous and suspicious, spanning a distance of approximately 3.4 cm.  Mammographic images were processed with CAD.  Physical examination of the upper right breast reveals a firm fixed nodule at the approximate 12 o'clock position.  Targeted ultrasound of the right breast was performed demonstrating an irregular hypoechoic mass at 12 o'clock 9 cm from nipple measuring 2 x 1.3 x 2.1 cm. This corresponds well with the mass seen at mammography. No definite lymphadenopathy seen in the right axilla.  IMPRESSION: 1. Highly suspicious right breast mass with associated calcifications measuring 2.1 cm.  2. Additional indeterminate calcifications in the retroareolar right breast.  RECOMMENDATION: 1. Ultrasound-guided biopsy of the mass in the right breast at the 12 o'clock position is recommended.  3. Stereotactic guided biopsy of the calcifications in the retroareolar right breast is recommended.  Biopsies are being scheduled for the patient at the [facility].  I have discussed the findings and recommendations with the patient. Results were also provided in writing at the conclusion of the visit. If applicable, a reminder letter will be sent to the patient regarding the next appointment.  BI-RADS CATEGORY  5: Highly suggestive of malignancy.   Electronically Signed   By: Everlean Alstrom M.D.   On: 04/07/2017 13:06    PATHOLOGY:  (R) breast biopsy: 04/13/17     (R) axillary lymph node biopsy: 05/08/17         ASSESSMENT & PLAN:   Stage IIB invasive ductal carcinoma of (R) breast; ER+/PR+/HER2+:  -Recalled from screening mammogram in 03/2017 for (R) breast abnormality. Diagnostic mammogram/ultrasound of (R) breast suspicious for malignancy. Underwent (R) breast biopsy on 04/13/17 revealing invasive ductal carcinoma, grade 3, ER+, PR+, HER2+. Seen initially in consultation with Dr. Lindi Adie at [facility], and patient elected to have her treatment closer to home and presented to [facility] for cycle #1 neoadjuvant chemo with [facility] on 05/29/17. Plan is to complete 6 cycles of neoadjuvant TCHP before breast surgery. She wants Dr. Lucia Gaskins in [place] to do her surgery; we will help facilitate that in the future.  -Due for cycle #4 TCHP today; labs reviewed and discussed with Dr. Talbert Cage. Treatment to be held today d/t thrombocytopenia and anemia.   -Next ECHO scheduled for 08/10/17; encouraged her to maintain this appointment.   -Since she was unable to complete MRI breasts at [facility] d/t body habitus, will help her get MRI breasts rescheduled at [place] [facility] where she has successfully had MRIs done in the past; will try to get this rescheduled before she returns to re-challenge cycle #4 chemotherapy.  -Return in  1 week to re-challenge cycle #4 TCHP.  -Return to cancer center for follow-up visit on day 1 of cycle #5 (in ~4 weeks).   Symptomatic anemia:  -Likely d/t chemotherapy.  -Hgb 8.1 g/dL today.  Recommended 1  unit PRBCs to be given today in lieu of chemo. Discussed the risks/benefits of PRBC transfusion and she agreed to proceed.  Orders placed.   Thrombocytopenia:  -Plts 71,000 today. Chemo held as a result.  No active bleeding.  -She will return next week to see if plts have sufficiently recovered to be able to give cycle #4 chemo.   Chemo-induced diarrhea:  -Improved with Imodium, Lomotil, and administration of IV fluids.  Will plan to continue IV fluids PRN.   Hypomagnesemia:  -Given her struggle with diarrhea post-chemotherapy, will administer 2 gm IV magnesium sulfate today. Shared with her that while diarrhea is still a possible side effect with infusion of IV mag, it is better than oral mag supplementation.  She agreed with this plan.         Dispo:  -Reschedule MRI breasts to evaluate disease response.  -Return to cancer center in 1 week to re-challenge cycle #4 TCHP chemo.  -Return to cancer center for follow-up on day 1, cycle #5 chemo.     All questions were answered to patient's stated satisfaction. Encouraged patient to call with any new concerns or questions before her next visit to the cancer center and we can certain see her sooner, if needed.    Plan of care discussed with Dr. Talbert Cage, who agrees with the above aforementioned.    Orders placed this encounter:  No orders of the defined types were placed in this encounter.     Mike Craze, NP [place] [phone]

## 2017-07-31 NOTE — Patient Instructions (Signed)
[place] at [facility] Discharge Instructions  RECOMMENDATIONS MADE BY THE CONSULTANT AND ANY TEST RESULTS WILL BE SENT TO YOUR REFERRING PHYSICIAN.  One unit of blood given, one liter of fluids given. Follow up as scheduled.  Thank you for choosing [place] at [facility] to provide your oncology and hematology care.  To afford each patient quality time with our provider, please arrive at least 15 minutes before your scheduled appointment time.    If you have a lab appointment with the [place] please come in thru the  Main Entrance and check in at the main information desk  You need to re-schedule your appointment should you arrive 10 or more minutes late.  We strive to give you quality time with our providers, and arriving late affects you and other patients whose appointments are after yours.  Also, if you no show three or more times for appointments you may be dismissed from the clinic at the providers discretion.     Again, thank you for choosing [facility].  Our hope is that these requests will decrease the amount of time that you wait before being seen by our physicians.       _____________________________________________________________  Should you have questions after your visit to [facility], please contact our office at [phone] between the hours of 8:30 a.m. and 4:30 p.m.  Voicemails left after 4:30 p.m. will not be returned until the following business day.  For prescription refill requests, have your pharmacy contact our office.       Resources For Cancer Patients and their Caregivers ? American Cancer Society: Can assist with transportation, wigs, general needs, runs Look Good Feel Better.        636-766-3858 ? Cancer Care: Provides financial assistance, online support groups, medication/co-pay assistance.  1-800-813-HOPE (548) 615-5347) ? [place] Assists  [place] cancer patients and their families through emotional , educational and financial support.  (801)746-1435 ? [place] DSS Where to apply for food stamps, Medicaid and utility assistance. (705) 008-8049 ? RCATS: Transportation to medical appointments. (424) 037-2622 ? Social Security Administration: May apply for disability if have a Stage IV cancer. (224)396-0980 816-090-5044 ? [employer], Disability and Transit Services: Assists with nutrition, care and transit needs. [place] Support Programs: @10RELATIVEDAYS @ > Cancer Support Group  2nd Tuesday of the month 1pm-2pm, Journey Room  > Creative Journey  3rd Tuesday of the month 1130am-1pm, Journey Room  > Look Good Feel Better  1st Wednesday of the month 10am-12 noon, Journey Room (Call [place] to register [phone])

## 2017-07-31 NOTE — Patient Instructions (Signed)
[facility] at [facility] Discharge Instructions  RECOMMENDATIONS MADE BY THE CONSULTANT AND ANY TEST RESULTS WILL BE SENT TO YOUR REFERRING PHYSICIAN.  You were seen today by Gretchen Dawson NP.   Thank you for choosing [facility] at [facility] to provide your oncology and hematology care.  To afford each patient quality time with our provider, please arrive at least 15 minutes before your scheduled appointment time.    If you have a lab appointment with the Cancer Center please come in thru the  Main Entrance and check in at the main information desk  You need to re-schedule your appointment should you arrive 10 or more minutes late.  We strive to give you quality time with our providers, and arriving late affects you and other patients whose appointments are after yours.  Also, if you no show three or more times for appointments you may be dismissed from the clinic at the providers discretion.     Again, thank you for choosing [facility].  Our hope is that these requests will decrease the amount of time that you wait before being seen by our physicians.       _____________________________________________________________  Should you have questions after your visit to [facility], please contact our office at [phone] between the hours of 8:30 a.m. and 4:30 p.m.  Voicemails left after 4:30 p.m. will not be returned until the following business day.  For prescription refill requests, have your pharmacy contact our office.       Resources For Cancer Patients and their Caregivers ? American Cancer Society: Can assist with transportation, wigs, general needs, runs Look Good Feel Better.        1-888-227-6333 ? Cancer Care: Provides financial assistance, online support groups, medication/co-pay assistance.  1-800-813-HOPE (4673) ? [facility] Assists [place] cancer patients and  their families through emotional , educational and financial support.  336-427-4357 ? [place] DSS Where to apply for food stamps, Medicaid and utility assistance. 336-342-1394 ? RC[facility]: Transportation to medical appointments. 336-347-2287 ? Social Security Administration: May apply for disability if have a Stage IV cancer. 336-342-7796 1-800-772-1213 ? [place] Aging, Disability and Transit Services: Assists with nutrition, care and transit needs. 336-349-2343  Cancer Center Support Programs: [email or web address]@ > Cancer Support Group  2nd Tuesday of the month 1pm-2pm, Journey Room  > Creative Journey  3rd Tuesday of the month 1130am-1pm, Journey Room  > Look Good Feel Better  1st Wednesday of the month 10am-12 noon, Journey Room (Call American Cancer Society to register 1-800-395-5775)    

## 2017-08-01 LAB — BPAM RBC
BLOOD PRODUCT EXPIRATION DATE: 201810212359
ISSUE DATE / TIME: 201810191207
Unit Type and Rh: 5100

## 2017-08-01 LAB — TYPE AND SCREEN
ABO/RH(D): A POS
Antibody Screen: NEGATIVE
Unit division: 0

## 2017-08-05 ENCOUNTER — Ambulatory Visit
Admission: RE | Admit: 2017-08-05 | Discharge: 2017-08-05 | Disposition: A | Discharge status: Home / Self Care | Payer: 59 | Source: Ambulatory Visit | Attending: Oncology | Admitting: Oncology

## 2017-08-05 ENCOUNTER — Ambulatory Visit (HOSPITAL_COMMUNITY): Payer: 59

## 2017-08-05 ENCOUNTER — Other Ambulatory Visit: Payer: Self-pay | Admitting: Hematology and Oncology

## 2017-08-05 DIAGNOSIS — Z170 Estrogen receptor positive status [ER+]: Principal | ICD-10-CM

## 2017-08-05 DIAGNOSIS — C50211 Malignant neoplasm of upper-inner quadrant of right female breast: Secondary | ICD-10-CM

## 2017-08-05 MED ORDER — GADOBENATE DIMEGLUMINE 529 MG/ML IV SOLN
20.0000 mL | Freq: Once | INTRAVENOUS | Status: AC | PRN
  Administered 2017-08-05: 20 mL via INTRAVENOUS

## 2017-08-07 ENCOUNTER — Encounter (HOSPITAL_COMMUNITY): Payer: Self-pay

## 2017-08-07 ENCOUNTER — Ambulatory Visit (HOSPITAL_COMMUNITY): Payer: 59

## 2017-08-07 ENCOUNTER — Encounter (HOSPITAL_BASED_OUTPATIENT_CLINIC_OR_DEPARTMENT_OTHER): Payer: 59

## 2017-08-07 VITALS — BP 145/85 | HR 74 | Temp 97.8°F | Resp 19 | Wt 248.0 lb

## 2017-08-07 DIAGNOSIS — C50211 Malignant neoplasm of upper-inner quadrant of right female breast: Secondary | ICD-10-CM

## 2017-08-07 DIAGNOSIS — E119 Type 2 diabetes mellitus without complications: Secondary | ICD-10-CM

## 2017-08-07 DIAGNOSIS — Z5112 Encounter for antineoplastic immunotherapy: Secondary | ICD-10-CM | POA: Diagnosis not present

## 2017-08-07 DIAGNOSIS — Z170 Estrogen receptor positive status [ER+]: Principal | ICD-10-CM

## 2017-08-07 DIAGNOSIS — Z5111 Encounter for antineoplastic chemotherapy: Secondary | ICD-10-CM | POA: Diagnosis not present

## 2017-08-07 DIAGNOSIS — C773 Secondary and unspecified malignant neoplasm of axilla and upper limb lymph nodes: Secondary | ICD-10-CM

## 2017-08-07 DIAGNOSIS — R739 Hyperglycemia, unspecified: Secondary | ICD-10-CM

## 2017-08-07 LAB — CBC WITH DIFFERENTIAL/PLATELET
BASOS PCT: 0 %
Basophils Absolute: 0 10*3/uL (ref 0.0–0.1)
EOS ABS: 0.1 10*3/uL (ref 0.0–0.7)
Eosinophils Relative: 1 %
HCT: 33.7 % — ABNORMAL LOW (ref 36.0–46.0)
HEMOGLOBIN: 10.9 g/dL — AB (ref 12.0–15.0)
Lymphocytes Relative: 19 %
Lymphs Abs: 1 10*3/uL (ref 0.7–4.0)
MCH: 31.7 pg (ref 26.0–34.0)
MCHC: 32.3 g/dL (ref 30.0–36.0)
MCV: 98 fL (ref 78.0–100.0)
MONOS PCT: 5 %
Monocytes Absolute: 0.3 10*3/uL (ref 0.1–1.0)
NEUTROS PCT: 75 %
Neutro Abs: 3.8 10*3/uL (ref 1.7–7.7)
PLATELETS: 157 10*3/uL (ref 150–400)
RBC: 3.44 MIL/uL — ABNORMAL LOW (ref 3.87–5.11)
RDW: 18.3 % — AB (ref 11.5–15.5)
WBC: 5.2 10*3/uL (ref 4.0–10.5)

## 2017-08-07 LAB — COMPREHENSIVE METABOLIC PANEL
ALBUMIN: 3.9 g/dL (ref 3.5–5.0)
ALK PHOS: 60 U/L (ref 38–126)
ALT: 25 U/L (ref 14–54)
ANION GAP: 8 (ref 5–15)
AST: 25 U/L (ref 15–41)
BUN: 15 mg/dL (ref 6–20)
CALCIUM: 10.1 mg/dL (ref 8.9–10.3)
CHLORIDE: 104 mmol/L (ref 101–111)
CO2: 25 mmol/L (ref 22–32)
Creatinine, Ser: 0.9 mg/dL (ref 0.44–1.00)
GFR calc Af Amer: >60 mL/min (ref >60)
GFR calc non Af Amer: >60 mL/min (ref >60)
GLUCOSE: 241 mg/dL — AB (ref 65–99)
POTASSIUM: 4.3 mmol/L (ref 3.5–5.1)
SODIUM: 137 mmol/L (ref 135–145)
TOTAL PROTEIN: 7.1 g/dL (ref 6.5–8.1)
Total Bilirubin: 0.6 mg/dL (ref 0.3–1.2)

## 2017-08-07 MED ORDER — DIPHENHYDRAMINE HCL 25 MG PO CAPS
50.0000 mg | ORAL_CAPSULE | Freq: Once | ORAL | Status: AC
  Administered 2017-08-07: 50 mg via ORAL
  Filled 2017-08-07: qty 2

## 2017-08-07 MED ORDER — ACETAMINOPHEN 325 MG PO TABS
650.0000 mg | ORAL_TABLET | Freq: Once | ORAL | Status: AC
  Administered 2017-08-07: 650 mg via ORAL
  Filled 2017-08-07: qty 2

## 2017-08-07 MED ORDER — PERTUZUMAB CHEMO INJECTION 420 MG/14ML
420.0000 mg | Freq: Once | INTRAVENOUS | Status: AC
  Administered 2017-08-07: 420 mg via INTRAVENOUS
  Filled 2017-08-07: qty 14

## 2017-08-07 MED ORDER — DOCETAXEL CHEMO INJECTION 160 MG/16ML
75.0000 mg/m2 | Freq: Once | INTRAVENOUS | Status: AC
  Administered 2017-08-07: 170 mg via INTRAVENOUS
  Filled 2017-08-07: qty 17

## 2017-08-07 MED ORDER — DEXAMETHASONE SODIUM PHOSPHATE 10 MG/ML IJ SOLN
10.0000 mg | Freq: Once | INTRAMUSCULAR | Status: AC
  Administered 2017-08-07: 10 mg via INTRAVENOUS
  Filled 2017-08-07: qty 1

## 2017-08-07 MED ORDER — SODIUM CHLORIDE 0.9 % IV SOLN
900.0000 mg | Freq: Once | INTRAVENOUS | Status: AC
  Administered 2017-08-07: 900 mg via INTRAVENOUS
  Filled 2017-08-07: qty 90

## 2017-08-07 MED ORDER — SODIUM CHLORIDE 0.9 % IV SOLN
Freq: Once | INTRAVENOUS | Status: AC
  Administered 2017-08-07: 10:00:00 via INTRAVENOUS

## 2017-08-07 MED ORDER — HEPARIN SOD (PORK) LOCK FLUSH 100 UNIT/ML IV SOLN
500.0000 [IU] | Freq: Once | INTRAVENOUS | Status: AC | PRN
  Administered 2017-08-07: 500 [IU]
  Filled 2017-08-07 (×2): qty 5

## 2017-08-07 MED ORDER — PALONOSETRON HCL INJECTION 0.25 MG/5ML
0.2500 mg | Freq: Once | INTRAVENOUS | Status: AC
  Administered 2017-08-07: 0.25 mg via INTRAVENOUS
  Filled 2017-08-07: qty 5

## 2017-08-07 MED ORDER — TRASTUZUMAB CHEMO 150 MG IV SOLR
700.0000 mg | Freq: Once | INTRAVENOUS | Status: AC
  Administered 2017-08-07: 700 mg via INTRAVENOUS
  Filled 2017-08-07: qty 33.33

## 2017-08-07 MED ORDER — DIPHENHYDRAMINE HCL 25 MG PO CAPS
ORAL_CAPSULE | ORAL | Status: AC
  Filled 2017-08-07: qty 1

## 2017-08-07 MED ORDER — PEGFILGRASTIM 6 MG/0.6ML ~~LOC~~ PSKT
6.0000 mg | PREFILLED_SYRINGE | Freq: Once | SUBCUTANEOUS | Status: AC
  Administered 2017-08-07: 6 mg via SUBCUTANEOUS
  Filled 2017-08-07: qty 0.6

## 2017-08-07 MED ORDER — INSULIN ASPART 100 UNIT/ML ~~LOC~~ SOLN
6.0000 [IU] | Freq: Once | SUBCUTANEOUS | Status: AC
  Administered 2017-08-07: 6 [IU] via SUBCUTANEOUS
  Filled 2017-08-07: qty 0.06

## 2017-08-07 NOTE — Progress Notes (Signed)
Unable to obtain enough blood from port a cath for lab work. Venipuncture performed with a 23 gauge butterfly needle to L Antecubital. Tolerated procedure well and without incident.    Tolerated infusions w/o adverse reaction. Alert, in no distress.  VSS.  Neulasta OBI placement per MD orders. OBI device filled per protocol and placed on  left upper arm. Needle/catheter placement noted prior to patient leaving. Tolerated without incident and aware of injection to be delivered in 27 hours.  Discharged ambulatory in c/o family.

## 2017-08-07 NOTE — Progress Notes (Signed)
Nutrition Follow-up:  Met with patient during infusion today.  Sister at chairside.  Patient with breast cancer.  Patient reports appetite has been good over the past 2 weeks as did not receive chemotherapy recently.  Awaiting labs for today to determine if chemotherapy can be given today.  Patient reports she continues to drink glucerna shake at least 1 time per day, had gravy biscuit this am, chicken nuggets and potato wedges last night for dinner.  Does not report diarrhea or any other nutrition impact symptoms today.    Medications: reviewed  Labs: glucose 241  Anthropometrics:   Weight today 248 lb increased from 243 lb on 10/8 but decreased from 259 lb on 9/28  Weight 264 lb 11.2 on 9/7.  6% weight loss in the last month and 3 weeks.   NUTRITION DIAGNOSIS: Inadequate oral intake improved    INTERVENTION:   Encouraged patient to continue glucerna shake for added nutrition. Discussed premier protein as option as well (better blood glucose control and higher protein) Encouraged patient to include good sources of protein at every meal (examples discussed). Patient has material for foods to eat with diarrhea.    MONITORING, EVALUATION, GOAL: weight trends, intake and glucose   NEXT VISIT: Nov 16 during infusion  Mary Secord B. Zenia Resides, [place], [place] Registered Dietitian [phone] (pager)

## 2017-08-10 ENCOUNTER — Encounter (HOSPITAL_COMMUNITY): Payer: Self-pay | Admitting: Cardiology

## 2017-08-10 ENCOUNTER — Ambulatory Visit (HOSPITAL_COMMUNITY)
Admission: RE | Admit: 2017-08-10 | Discharge: 2017-08-10 | Disposition: A | Discharge status: Home / Self Care | Payer: 59 | Source: Ambulatory Visit | Attending: Family Medicine | Admitting: Family Medicine

## 2017-08-10 ENCOUNTER — Ambulatory Visit (HOSPITAL_BASED_OUTPATIENT_CLINIC_OR_DEPARTMENT_OTHER)
Admission: RE | Admit: 2017-08-10 | Discharge: 2017-08-10 | Disposition: A | Discharge status: Home / Self Care | Payer: 59 | Source: Ambulatory Visit | Attending: Cardiology | Admitting: Cardiology

## 2017-08-10 ENCOUNTER — Ambulatory Visit (HOSPITAL_COMMUNITY): Payer: 59

## 2017-08-10 VITALS — BP 138/81 | HR 90 | Wt 245.5 lb

## 2017-08-10 DIAGNOSIS — I10 Essential (primary) hypertension: Secondary | ICD-10-CM

## 2017-08-10 DIAGNOSIS — C50211 Malignant neoplasm of upper-inner quadrant of right female breast: Secondary | ICD-10-CM

## 2017-08-10 DIAGNOSIS — I350 Nonrheumatic aortic (valve) stenosis: Secondary | ICD-10-CM

## 2017-08-10 DIAGNOSIS — Z170 Estrogen receptor positive status [ER+]: Secondary | ICD-10-CM

## 2017-08-10 DIAGNOSIS — E785 Hyperlipidemia, unspecified: Secondary | ICD-10-CM | POA: Insufficient documentation

## 2017-08-10 DIAGNOSIS — R011 Cardiac murmur, unspecified: Secondary | ICD-10-CM | POA: Insufficient documentation

## 2017-08-10 DIAGNOSIS — E119 Type 2 diabetes mellitus without complications: Secondary | ICD-10-CM | POA: Diagnosis not present

## 2017-08-10 NOTE — Progress Notes (Signed)
  Echocardiogram 2D Echocardiogram has been performed.  Renee Harris 08/10/2017, 9:50 AM

## 2017-08-10 NOTE — Progress Notes (Signed)
Oncology: Dr. Lindi Adie  56 yo with history of HTN and breast cancer presents for cardio-oncology followup.  Patient was diagnosed with breast cancer on the right in 7/18.  ER+/PR+/HER2+.  She will have lumpectomy followed by [facility] x 6 months then Herceptin alone to complete a year.    She is stable symptomatically.  No exertional chest pain or dyspnea. No lightheadedness/syncope.   PMH: 1. HTN 2. Hypertriglyceridemia 3. Breast cancer: Diagnosed 7/18, on right.  ER+/PR+/HER2+.  She will have lumpectomy followed by [facility] x 6 months then Herceptin alone to complete a year.   - Echo (7/18): EF 65-70%, GLS -19.8%, mild AS with mean gradient 15 mmHg, normal RV size and systolic function.  - Echo (10/18): EF 65-70%, mild LVH, GLS -19.6%, moderate AS with mean gradient 26, AVA 1.3 cm^2.  4. Aortic stenosis: Mild on 7/18 echo.  Moderate on 10/18 echo.   Social History   Social History  . Marital status: Married    Spouse name: N/A  . Number of children: N/A  . Years of education: N/A   Occupational History  . Not on file.   Social History Main Topics  . Smoking status: Never Smoker  . Smokeless tobacco: Never Used  . Alcohol use Yes     Comment: 2 x year  . Drug use: No  . Sexual activity: Yes    Birth control/ protection: Surgical   Other Topics Concern  . Not on file   Social History Narrative  . No narrative on file   Family History  Problem Relation Age of Onset  . Lung cancer Maternal Grandmother   . Anesthesia problems Neg Hx   . Hypotension Neg Hx   . Malignant hyperthermia Neg Hx   . Pseudochol deficiency Neg Hx    ROS: All systems reviewed and negative except as per HPI.   Current Outpatient Prescriptions  Medication Sig Dispense Refill  . albuterol (PROVENTIL HFA;VENTOLIN HFA) 108 (90 BASE) MCG/ACT inhaler Inhale 1 puff into the lungs every 6 (six) hours as needed for wheezing or shortness of breath.     . allopurinol (ZYLOPRIM) 300 MG tablet Take 300  mg by mouth at bedtime.     . Black Cohosh 40 MG CAPS Take 40 mg by mouth at bedtime.     Marland Kitchen dexamethasone (DECADRON) 4 MG tablet Take 1 tablet (4 mg total) by mouth daily. 1 tab day before chemo and 1 tab day after 12 tablet 0  . diphenhydrAMINE (BENADRYL) 25 mg capsule Take 25 mg by mouth at bedtime as needed for allergies. For allergies     . diphenoxylate-atropine (LOMOTIL) 2.5-0.025 MG tablet TAKE (1) TABLET BY MOUTH (4) TIMES DAILY AS NEEDED FOR DIARRHEA OR LOOSE STOOLS. 45 tablet 0  . escitalopram (LEXAPRO) 20 MG tablet Take 20 mg by mouth at bedtime.     . fenofibrate 160 MG tablet Take 160 mg by mouth at bedtime.     . hydrochlorothiazide (HYDRODIURIL) 12.5 MG tablet Take 12.5 mg by mouth at bedtime.     Marland Kitchen HYDROcodone-acetaminophen (NORCO/VICODIN) 5-325 MG tablet Take 1 tablet by mouth every 6 (six) hours as needed for moderate pain. 15 tablet 0  . ibuprofen (ADVIL,MOTRIN) 200 MG tablet Take 200 mg by mouth every 8 (eight) hours as needed for headache or mild pain.    Marland Kitchen lidocaine (XYLOCAINE) 2 % solution     . lidocaine-prilocaine (EMLA) cream Apply to affected area once 30 g 3  . losartan (COZAAR) 50 MG  tablet Take 50 mg by mouth at bedtime.     . magic mouthwash w/lidocaine SOLN Take 5 mLs by mouth 4 (four) times daily as needed for mouth pain. 360 mL 1  . metFORMIN (GLUCOPHAGE) 500 MG tablet Take 500 mg by mouth 2 (two) times daily with a meal.    . Multiple Vitamin (MULTIVITAMIN) tablet Take 1 tablet by mouth daily.    Marland Kitchen omeprazole (PRILOSEC) 40 MG capsule Take 1 capsule (40 mg total) by mouth daily. 30 capsule 3  . ondansetron (ZOFRAN) 8 MG tablet Take 1 tablet (8 mg total) by mouth 2 (two) times daily as needed for refractory nausea / vomiting. Start on day 3 after chemo. 30 tablet 1  . prochlorperazine (COMPAZINE) 10 MG tablet TAKE (1) TABLET BY MOUTH EVERY SIX HOURS AS NEEDED FOR NAUSEA AND VOMITING. 30 tablet 0  . zolpidem (AMBIEN) 10 MG tablet Take 1 tablet (10 mg total) by mouth  at bedtime as needed for sleep. 30 tablet 0   No current facility-administered medications for this encounter.    BP 138/81   Pulse 90   Wt 245 lb 8 oz (111.4 kg)   SpO2 98%   BMI 46.39 kg/m  General: NAD Neck: No JVD, no thyromegaly or thyroid nodule.  Lungs: Clear to auscultation bilaterally with normal respiratory effort. CV: Nondisplaced PMI.  Heart regular S1/S2, no S3/S4, 2/6 SEM RUSB with clear S2.  No peripheral edema.  No carotid bruit.  Normal pedal pulses.  Abdomen: Soft, nontender, no hepatosplenomegaly, no distention.  Skin: Intact without lesions or rashes.  Neurologic: Alert and oriented x 3.  Psych: Normal affect. Extremities: No clubbing or cyanosis.  HEENT: Normal.   Assessment/Plan: 1. Breast cancer: Patient will be getting therapy with Herceptin for 1 year.  I reviewed today's echo. EF and strain pattern remain normal.  She will return for echo and office visit in 3 months.  2. HTN: BP reasonably controlled.   3. Aortic stenosis: Moderate.  Will need to be followed over time.   Loralie Champagne 08/10/2017

## 2017-08-14 ENCOUNTER — Ambulatory Visit (HOSPITAL_COMMUNITY): Payer: 59

## 2017-08-17 ENCOUNTER — Encounter (HOSPITAL_COMMUNITY): Payer: Self-pay

## 2017-08-17 ENCOUNTER — Encounter (HOSPITAL_COMMUNITY): Payer: 59 | Attending: Hematology and Oncology

## 2017-08-17 DIAGNOSIS — Z79899 Other long term (current) drug therapy: Secondary | ICD-10-CM | POA: Insufficient documentation

## 2017-08-17 DIAGNOSIS — C50211 Malignant neoplasm of upper-inner quadrant of right female breast: Secondary | ICD-10-CM | POA: Insufficient documentation

## 2017-08-17 DIAGNOSIS — Z7984 Long term (current) use of oral hypoglycemic drugs: Secondary | ICD-10-CM | POA: Insufficient documentation

## 2017-08-17 DIAGNOSIS — Z170 Estrogen receptor positive status [ER+]: Secondary | ICD-10-CM | POA: Insufficient documentation

## 2017-08-17 DIAGNOSIS — G4700 Insomnia, unspecified: Secondary | ICD-10-CM | POA: Insufficient documentation

## 2017-08-17 DIAGNOSIS — Z87442 Personal history of urinary calculi: Secondary | ICD-10-CM | POA: Insufficient documentation

## 2017-08-17 DIAGNOSIS — Z9049 Acquired absence of other specified parts of digestive tract: Secondary | ICD-10-CM | POA: Insufficient documentation

## 2017-08-17 DIAGNOSIS — E785 Hyperlipidemia, unspecified: Secondary | ICD-10-CM | POA: Insufficient documentation

## 2017-08-17 DIAGNOSIS — R634 Abnormal weight loss: Secondary | ICD-10-CM | POA: Insufficient documentation

## 2017-08-17 DIAGNOSIS — I10 Essential (primary) hypertension: Secondary | ICD-10-CM | POA: Insufficient documentation

## 2017-08-17 DIAGNOSIS — Z90710 Acquired absence of both cervix and uterus: Secondary | ICD-10-CM | POA: Insufficient documentation

## 2017-08-17 DIAGNOSIS — E119 Type 2 diabetes mellitus without complications: Secondary | ICD-10-CM | POA: Insufficient documentation

## 2017-08-17 DIAGNOSIS — Z23 Encounter for immunization: Secondary | ICD-10-CM | POA: Diagnosis not present

## 2017-08-17 DIAGNOSIS — Z801 Family history of malignant neoplasm of trachea, bronchus and lung: Secondary | ICD-10-CM | POA: Insufficient documentation

## 2017-08-17 DIAGNOSIS — Z9221 Personal history of antineoplastic chemotherapy: Secondary | ICD-10-CM | POA: Insufficient documentation

## 2017-08-17 DIAGNOSIS — R197 Diarrhea, unspecified: Secondary | ICD-10-CM | POA: Insufficient documentation

## 2017-08-17 MED ORDER — INFLUENZA VAC SPLIT QUAD 0.5 ML IM SUSY
0.5000 mL | PREFILLED_SYRINGE | Freq: Once | INTRAMUSCULAR | Status: AC
  Administered 2017-08-17: 0.5 mL via INTRAMUSCULAR
  Filled 2017-08-17: qty 0.5

## 2017-08-17 MED ORDER — HEPARIN SOD (PORK) LOCK FLUSH 100 UNIT/ML IV SOLN
500.0000 [IU] | Freq: Once | INTRAVENOUS | Status: AC
  Administered 2017-08-17: 500 [IU] via INTRAVENOUS
  Filled 2017-08-17: qty 5

## 2017-08-17 MED ORDER — SODIUM CHLORIDE 0.9 % IV SOLN
INTRAVENOUS | Status: DC
  Administered 2017-08-17: 11:00:00 via INTRAVENOUS

## 2017-08-17 MED ORDER — SODIUM CHLORIDE 0.9% FLUSH
10.0000 mL | INTRAVENOUS | Status: DC | PRN
  Administered 2017-08-17: 10 mL via INTRAVENOUS
  Filled 2017-08-17: qty 10

## 2017-08-17 NOTE — Patient Instructions (Signed)
[facility] at [facility] Discharge Instructions  RECOMMENDATIONS MADE BY THE CONSULTANT AND ANY TEST RESULTS WILL BE SENT TO YOUR REFERRING PHYSICIAN.  One liter of fluids given Follow up as scheduled.  Thank you for choosing [facility] at [facility] to provide your oncology and hematology care.  To afford each patient quality time with our provider, please arrive at least 15 minutes before your scheduled appointment time.    If you have a lab appointment with the Cancer Center please come in thru the  Main Entrance and check in at the main information desk  You need to re-schedule your appointment should you arrive 10 or more minutes late.  We strive to give you quality time with our providers, and arriving late affects you and other patients whose appointments are after yours.  Also, if you no show three or more times for appointments you may be dismissed from the clinic at the providers discretion.     Again, thank you for choosing [facility].  Our hope is that these requests will decrease the amount of time that you wait before being seen by our physicians.       _____________________________________________________________  Should you have questions after your visit to [facility], please contact our office at [phone] between the hours of 8:30 a.m. and 4:30 p.m.  Voicemails left after 4:30 p.m. will not be returned until the following business day.  For prescription refill requests, have your pharmacy contact our office.       Resources For Cancer Patients and their Caregivers ? American Cancer Society: Can assist with transportation, wigs, general needs, runs Look Good Feel Better.        1-888-227-6333 ? Cancer Care: Provides financial assistance, online support groups, medication/co-pay assistance.  1-800-813-HOPE (4673) ? [facility] Assists [place] cancer patients  and their families through emotional , educational and financial support.  336-427-4357 ? [place] DSS Where to apply for food stamps, Medicaid and utility assistance. 336-342-1394 ? [facility]: Transportation to medical appointments. 336-347-2287 ? Social Security Administration: May apply for disability if have a Stage IV cancer. 336-342-7796 1-800-772-1213 ? [place] Aging, Disability and Transit Services: Assists with nutrition, care and transit needs. 336-349-2343  Cancer Center Support Programs: [email or web address]@ > Cancer Support Group  2nd Tuesday of the month 1pm-2pm, Journey Room  > Creative Journey  3rd Tuesday of the month 1130am-1pm, Journey Room  > Look Good Feel Better  1st Wednesday of the month 10am-12 noon, Journey Room (Call American Cancer Society to register 1-800-395-5775)   

## 2017-08-17 NOTE — Progress Notes (Signed)
Renee Harris presents today for injection per MD orders.  Fluarix 0.37ml administered IM  in left Upper Arm. Administration without incident. Patient tolerated well.  Treatment given per orders. Patient tolerated it well without problems. Vitals stable and discharged home from clinic ambulatory. Follow up as scheduled.

## 2017-08-28 ENCOUNTER — Encounter (HOSPITAL_COMMUNITY): Payer: 59

## 2017-08-28 ENCOUNTER — Encounter (HOSPITAL_COMMUNITY): Payer: Self-pay

## 2017-08-28 ENCOUNTER — Ambulatory Visit (HOSPITAL_COMMUNITY): Payer: 59

## 2017-08-28 ENCOUNTER — Other Ambulatory Visit: Payer: Self-pay

## 2017-08-28 ENCOUNTER — Encounter (HOSPITAL_BASED_OUTPATIENT_CLINIC_OR_DEPARTMENT_OTHER): Payer: 59 | Admitting: Oncology

## 2017-08-28 DIAGNOSIS — C50211 Malignant neoplasm of upper-inner quadrant of right female breast: Secondary | ICD-10-CM | POA: Diagnosis not present

## 2017-08-28 DIAGNOSIS — Z170 Estrogen receptor positive status [ER+]: Secondary | ICD-10-CM | POA: Diagnosis not present

## 2017-08-28 DIAGNOSIS — Z9049 Acquired absence of other specified parts of digestive tract: Secondary | ICD-10-CM | POA: Diagnosis not present

## 2017-08-28 DIAGNOSIS — I10 Essential (primary) hypertension: Secondary | ICD-10-CM | POA: Diagnosis not present

## 2017-08-28 DIAGNOSIS — Z79899 Other long term (current) drug therapy: Secondary | ICD-10-CM | POA: Diagnosis not present

## 2017-08-28 DIAGNOSIS — G4700 Insomnia, unspecified: Secondary | ICD-10-CM | POA: Diagnosis not present

## 2017-08-28 DIAGNOSIS — E119 Type 2 diabetes mellitus without complications: Secondary | ICD-10-CM | POA: Diagnosis not present

## 2017-08-28 DIAGNOSIS — Z7984 Long term (current) use of oral hypoglycemic drugs: Secondary | ICD-10-CM | POA: Diagnosis not present

## 2017-08-28 DIAGNOSIS — Z801 Family history of malignant neoplasm of trachea, bronchus and lung: Secondary | ICD-10-CM | POA: Diagnosis not present

## 2017-08-28 DIAGNOSIS — R634 Abnormal weight loss: Secondary | ICD-10-CM | POA: Diagnosis not present

## 2017-08-28 DIAGNOSIS — C773 Secondary and unspecified malignant neoplasm of axilla and upper limb lymph nodes: Secondary | ICD-10-CM | POA: Diagnosis not present

## 2017-08-28 DIAGNOSIS — E785 Hyperlipidemia, unspecified: Secondary | ICD-10-CM | POA: Diagnosis not present

## 2017-08-28 DIAGNOSIS — Z87442 Personal history of urinary calculi: Secondary | ICD-10-CM | POA: Diagnosis not present

## 2017-08-28 DIAGNOSIS — Z90710 Acquired absence of both cervix and uterus: Secondary | ICD-10-CM | POA: Diagnosis not present

## 2017-08-28 DIAGNOSIS — Z9221 Personal history of antineoplastic chemotherapy: Secondary | ICD-10-CM | POA: Diagnosis not present

## 2017-08-28 DIAGNOSIS — R197 Diarrhea, unspecified: Secondary | ICD-10-CM | POA: Diagnosis not present

## 2017-08-28 LAB — CBC WITH DIFFERENTIAL/PLATELET
Basophils Absolute: 0 10*3/uL (ref 0.0–0.1)
Basophils Relative: 0 %
EOS ABS: 0 10*3/uL (ref 0.0–0.7)
EOS PCT: 0 %
HEMATOCRIT: 28.2 % — AB (ref 36.0–46.0)
Hemoglobin: 9.5 g/dL — ABNORMAL LOW (ref 12.0–15.0)
LYMPHS PCT: 28 %
Lymphs Abs: 1.7 10*3/uL (ref 0.7–4.0)
MCH: 32.9 pg (ref 26.0–34.0)
MCHC: 33.7 g/dL (ref 30.0–36.0)
MCV: 97.6 fL (ref 78.0–100.0)
MONO ABS: 0.9 10*3/uL (ref 0.1–1.0)
Monocytes Relative: 14 %
Neutro Abs: 3.5 10*3/uL (ref 1.7–7.7)
Neutrophils Relative %: 58 %
Platelets: 49 10*3/uL — ABNORMAL LOW (ref 150–400)
RBC: 2.89 MIL/uL — ABNORMAL LOW (ref 3.87–5.11)
RDW: 18.7 % — AB (ref 11.5–15.5)
WBC: 6 10*3/uL (ref 4.0–10.5)

## 2017-08-28 LAB — COMPREHENSIVE METABOLIC PANEL
ALT: 23 U/L (ref 14–54)
ANION GAP: 8 (ref 5–15)
AST: 23 U/L (ref 15–41)
Albumin: 4.1 g/dL (ref 3.5–5.0)
Alkaline Phosphatase: 60 U/L (ref 38–126)
BILIRUBIN TOTAL: 0.6 mg/dL (ref 0.3–1.2)
BUN: 21 mg/dL — AB (ref 6–20)
CO2: 25 mmol/L (ref 22–32)
Calcium: 10.2 mg/dL (ref 8.9–10.3)
Chloride: 103 mmol/L (ref 101–111)
Creatinine, Ser: 0.93 mg/dL (ref 0.44–1.00)
GFR calc Af Amer: >60 mL/min (ref >60)
GFR calc non Af Amer: >60 mL/min (ref >60)
Glucose, Bld: 121 mg/dL — ABNORMAL HIGH (ref 65–99)
POTASSIUM: 3.8 mmol/L (ref 3.5–5.1)
Sodium: 136 mmol/L (ref 135–145)
TOTAL PROTEIN: 6.9 g/dL (ref 6.5–8.1)

## 2017-08-28 MED ORDER — HEPARIN SOD (PORK) LOCK FLUSH 100 UNIT/ML IV SOLN
500.0000 [IU] | Freq: Once | INTRAVENOUS | Status: AC
  Administered 2017-08-28: 500 [IU] via INTRAVENOUS

## 2017-08-28 MED ORDER — HEPARIN SOD (PORK) LOCK FLUSH 100 UNIT/ML IV SOLN
INTRAVENOUS | Status: AC
  Filled 2017-08-28: qty 5

## 2017-08-28 NOTE — Progress Notes (Signed)
Hold tx today per Dr. Talbert Cage d/t platelet count of 49,000.

## 2017-08-28 NOTE — Progress Notes (Signed)
Nutrition Follow-up:  Patient with breast cancer and receiving chemotherapy.    Met with patient and sister today, infusion held due to low platelets.  Patient reports appetite has been good over the past few weeks, just a few episodes of diarrhea. Otherwise has been eating well, still drinking glucerna shakes for additional calories and protein.  Looking forward to eating [place] on Thanksgiving  Medications: reviewed  Labs: reviewed  Anthropometrics:   Weight 242 lb today decreased from 248 lb on 10/26.    243 lb on 10/8 259 lb on 9/28  NUTRITION DIAGNOSIS: Inadequate oral intake improved   INTERVENTION:   Encouraged patient to continue glucerna shake for added nutrition.  Patient still planning on trying premier protein but has not yet. Encouraged well balanced diet with focus on getting good sources of protein at every meal.      MONITORING, EVALUATION, GOAL: weight trends, intake, glucose   NEXT VISIT: as needed, treatment days have been switched on non RD-days.  Patient knows to contact me with questions  Jerren Flinchbaugh B. Zenia Resides, [place], [place] Registered Dietitian [phone] (pager)

## 2017-08-28 NOTE — Progress Notes (Signed)
Renee Harris, Renee Harris [id number]   CLINIC:  Medical Oncology/Hematology  PCP:  Sharilyn Sites, MD [place] [address] [phone]   REASON FOR VISIT:  Follow-up for Stage IIB invasive ductal carcinoma of (R) breast; ER+/PR+/HER2+  CURRENT THERAPY: Neoadjuvant Taxotere/Carbo/Herceptin/Perjeta every 3 weeks, beginning 05/29/17   BRIEF ONCOLOGIC HISTORY:    Malignant neoplasm of upper-inner quadrant of right breast in female, estrogen receptor positive ([place])   04/13/2017 Initial Diagnosis    Screening detected right breast mass with calcifications 2.1 cm in size and 3.4 cm in size. Calcifications were fibroadenoma. Right breast biopsy upper inner quadrant 1:00 mass: IDC with DCIS with necrosis, grade 3, ER 100%, PR 70%, Ki-67 60%, HER-2 positive ratio 2.34, T2 N1 stage IB (New AJCC)      04/25/2017 Breast MRI    Right breast upper inner quadrant 2.2 x 1.7 x 2.7 cm mass, abnormal area of clumped non-mass enhancement in the right lateral breast 3.2 x 1.9 x 1 cm, too abnormal lymph nodes right axilla 4.3 and 1.7 cm (biopsy-proven breast cancer)       05/08/2017 Procedure    Right axilla lymph node biopsy: Metastatic carcinoma      05/13/2017 Procedure    Right breast biopsy retroareolar region: Intraductal papilloma         INTERVAL HISTORY:  Renee Harris 56 y.o. female returns for routine follow-up for right breast cancer.   She presents today for continue follow-up and evaluation for cycle 5 of TCH-P.  She states that she has been doing fairly well.  She states that her appetite is good and she has not had any weight loss.  Her previous diarrhea has improved.  She denies any recent infections, chest pain, shortness of breath, abdominal pain, focal weakness.     REVIEW OF SYSTEMS:  Review of Systems  Constitutional: Positive for fatigue. Negative for chills and fever.  HENT:  Negative.   Eyes: Negative.   Respiratory:  Negative for cough and shortness of breath.   Cardiovascular: Negative.   Gastrointestinal: Negative for abdominal pain and blood in stool.  Genitourinary: Negative.  Negative for dysuria, hematuria and vaginal bleeding.   Musculoskeletal: Negative.   Skin: Negative.   Neurological: Negative for headaches.  Hematological: Does not bruise/bleed easily.  Psychiatric/Behavioral: Negative.      PAST MEDICAL/SURGICAL HISTORY:  Past Medical History:  Diagnosis Date  . Asthma   . Blood transfusion    as child  . Diabetes ([place])    type 2   . Heart murmur    d/t aortic stenosis   . History of kidney stones   . Hyperlipidemia   . Hypertension   . Normal echocardiogram   . Sleep apnea    Past Surgical History:  Procedure Laterality Date  . ABDOMINAL HYSTERECTOMY  2004  . 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 INSERTION PORT-A-CATH N/A 05/25/2017   Performed by Alphonsa Overall, MD at [facility]  . KIDNEY STONE SURGERY     2017 2-23  . LAPAROSCOPIC CHOLECYSTECTOMY N/A 05/30/2011   Performed by Donato Heinz, MD at [facility] ORS     SOCIAL HISTORY:  Social History   Socioeconomic History  . Marital status: Married    Spouse name: Not on file  . Number of children: Not on file  . Years of education: Not on file  . Highest education level: Not on file  Social Needs  . Financial resource strain: Not on file  . Food insecurity - worry: Not on file  . Food insecurity - inability: Not on file  . Transportation needs - medical: Not on file  . Transportation needs - non-medical: Not on file  Occupational History  . Not on file  Tobacco Use  . Smoking status: Never Smoker  . Smokeless tobacco: Never Used  Substance and Sexual Activity  . Alcohol use: Yes    Comment: 2 x year  . Drug use: No  . Sexual activity: Yes    Birth control/protection: Surgical  Other Topics Concern  . Not on file  Social History Narrative  . Not on file    FAMILY HISTORY:    Family History  Problem Relation Age of Onset  . Lung cancer Maternal Grandmother   . Anesthesia problems Neg Hx   . Hypotension Neg Hx   . Malignant hyperthermia Neg Hx   . Pseudochol deficiency Neg Hx     CURRENT MEDICATIONS:  Outpatient Encounter Medications as of 08/28/2017  Medication Sig Note  . albuterol (PROVENTIL HFA;VENTOLIN HFA) 108 (90 BASE) MCG/ACT inhaler Inhale 1 puff into the lungs every 6 (six) hours as needed for wheezing or shortness of breath.    . allopurinol (ZYLOPRIM) 300 MG tablet Take 300 mg by mouth at bedtime.    . Black Cohosh 40 MG CAPS Take 40 mg by mouth at bedtime.    Marland Kitchen dexamethasone (DECADRON) 4 MG tablet Take 1 tablet (4 mg total) by mouth daily. 1 tab day before chemo and 1 tab day after 05/20/2017: Will start when starts chemtherapy   . diphenhydrAMINE (BENADRYL) 25 mg capsule Take 25 mg by mouth at bedtime as needed for allergies. For allergies    . diphenoxylate-atropine (LOMOTIL) 2.5-0.025 MG tablet TAKE (1) TABLET BY MOUTH (4) TIMES DAILY AS NEEDED FOR DIARRHEA OR LOOSE STOOLS.   Marland Kitchen escitalopram (LEXAPRO) 20 MG tablet Take 20 mg by mouth at bedtime.    . fenofibrate 160 MG tablet Take 160 mg by mouth at bedtime.    . hydrochlorothiazide (HYDRODIURIL) 12.5 MG tablet Take 12.5 mg by mouth at bedtime.    Marland Kitchen HYDROcodone-acetaminophen (NORCO/VICODIN) 5-325 MG tablet Take 1 tablet by mouth every 6 (six) hours as needed for moderate pain.   Marland Kitchen ibuprofen (ADVIL,MOTRIN) 200 MG tablet Take 200 mg by mouth every 8 (eight) hours as needed for headache or mild pain.   Marland Kitchen lidocaine (XYLOCAINE) 2 % solution    . lidocaine-prilocaine (EMLA) cream Apply to affected area once 05/20/2017: Will start when starts chemtherapy   . losartan (COZAAR) 50 MG tablet Take 50 mg by mouth at bedtime.    . magic mouthwash w/lidocaine SOLN Take 5 mLs by mouth 4 (four) times daily as needed for mouth pain.   . metFORMIN (GLUCOPHAGE) 500 MG tablet Take 500 mg by mouth 2 (two) times daily  with a meal.   . Multiple Vitamin (MULTIVITAMIN) tablet Take 1 tablet by mouth daily.   Marland Kitchen omeprazole (PRILOSEC) 40 MG capsule Take 1 capsule (40 mg total) by mouth daily.   . ondansetron (ZOFRAN) 8 MG tablet Take 1 tablet (8 mg total) by mouth 2 (two) times daily as needed for refractory nausea / vomiting. Start on day 3 after chemo. 05/20/2017: Will start when starts chemtherapy  . prochlorperazine (COMPAZINE) 10 MG tablet TAKE (1) TABLET BY MOUTH EVERY SIX HOURS AS NEEDED FOR NAUSEA AND VOMITING.   Marland Kitchen zolpidem (AMBIEN) 10 MG  tablet Take 1 tablet (10 mg total) by mouth at bedtime as needed for sleep.    No facility-administered encounter medications on file as of 08/28/2017.     ALLERGIES:  Allergies  Allergen Reactions  . Latex Itching  . Pyridostigmine Bromide Other (See Comments)    Makes muscles twitch  . Statins Nausea Only     PHYSICAL EXAM:  ECOG Performance status: 1-2 - Symptomatic; requires occasional assistance.      Physical Exam  Constitutional: She is oriented to person, place, and time and well-developed, well-nourished, and in no distress.  Seen in chemo chair in infusion area   HENT:  Head: Normocephalic.  Mouth/Throat: Oropharynx is clear and moist.  Eyes: Conjunctivae are normal. No scleral icterus.  Neck: Normal range of motion. Neck supple.  Cardiovascular: Normal rate and regular rhythm.  Pulmonary/Chest: Effort normal and breath sounds normal. No respiratory distress.  Abdominal: Soft. Bowel sounds are normal. There is no tenderness.  Musculoskeletal: Normal range of motion. She exhibits no edema.  Lymphadenopathy:    She has no cervical adenopathy.       Right: No supraclavicular adenopathy present.       Left: No supraclavicular adenopathy present.  Neurological: She is alert and oriented to person, place, and time. No cranial nerve deficit.  Skin: Skin is warm and dry. No rash noted. There is pallor.  Psychiatric: Mood, memory, affect and judgment  normal.  Nursing note and vitals reviewed.    LABORATORY DATA:  I have reviewed the labs as listed.  CBC    Component Value Date/Time   WBC 6.0 08/28/2017 0843   RBC 2.89 (L) 08/28/2017 0843   HGB 9.5 (L) 08/28/2017 0843   HGB 13.8 04/22/2017 0831   HCT 28.2 (L) 08/28/2017 0843   HCT 41.9 04/22/2017 0831   PLT 49 (L) 08/28/2017 0843   PLT 184 04/22/2017 0831   MCV 97.6 08/28/2017 0843   MCV 85.7 04/22/2017 0831   MCH 32.9 08/28/2017 0843   MCHC 33.7 08/28/2017 0843   RDW 18.7 (H) 08/28/2017 0843   RDW 13.9 04/22/2017 0831   LYMPHSABS 1.7 08/28/2017 0843   LYMPHSABS 2.1 04/22/2017 0831   MONOABS 0.9 08/28/2017 0843   MONOABS 0.5 04/22/2017 0831   EOSABS 0.0 08/28/2017 0843   EOSABS 0.3 04/22/2017 0831   BASOSABS 0.0 08/28/2017 0843   BASOSABS 0.0 04/22/2017 0831   CMP Latest Ref Rng & Units 08/28/2017 08/07/2017 07/31/2017  Glucose 65 - 99 mg/dL 121(H) 241(H) 151(H)  BUN 6 - 20 mg/dL 21(H) 15 15  Creatinine 0.44 - 1.00 mg/dL 0.93 0.90 0.89  Sodium 135 - 145 mmol/L 136 137 140  Potassium 3.5 - 5.1 mmol/L 3.8 4.3 3.7  Chloride 101 - 111 mmol/L 103 104 107  CO2 22 - 32 mmol/L '25 25 25  ' Calcium 8.9 - 10.3 mg/dL 10.2 10.1 9.6  Total Protein 6.5 - 8.1 g/dL 6.9 7.1 6.6  Total Bilirubin 0.3 - 1.2 mg/dL 0.6 0.6 0.8  Alkaline Phos 38 - 126 U/L 60 60 65  AST 15 - 41 U/L '23 25 24  ' ALT 14 - 54 U/L '23 25 29    ' PENDING LABS:    DIAGNOSTIC IMAGING:  *The following radiologic images and reports have been reviewed independently and agree with below findings.  Mammogram: 04/07/17 CLINICAL DATA:  Screening recall for right breast mass and calcifications. In addition, the patient states she now feels a palpable abnormality in her upper right breast since her recent screening  mammogram.  EXAM: 2D DIGITAL DIAGNOSTIC UNILATERAL RIGHT MAMMOGRAM WITH CAD AND ADJUNCT TOMO  RIGHT BREAST ULTRASOUND  COMPARISON:  Screening mammogram dated 03/23/2017.  ACR Breast Density  Category b: There are scattered areas of fibroglandular density.  FINDINGS: Cc and MLO tomograms of the right breast as well as spot compression magnification views of the right breast were performed. There is an irregular spiculated mass with associated microcalcifications in the superior right breast measuring approximately 2.1 cm. Spot compression magnification views of the retroareolar right breast demonstrate loosely grouped predominantly coarse calcifications, however some of which are amorphous and suspicious, spanning a distance of approximately 3.4 cm.  Mammographic images were processed with CAD.  Physical examination of the upper right breast reveals a firm fixed nodule at the approximate 12 o'clock position.  Targeted ultrasound of the right breast was performed demonstrating an irregular hypoechoic mass at 12 o'clock 9 cm from nipple measuring 2 x 1.3 x 2.1 cm. This corresponds well with the mass seen at mammography. No definite lymphadenopathy seen in the right axilla.  IMPRESSION: 1. Highly suspicious right breast mass with associated calcifications measuring 2.1 cm.  2. Additional indeterminate calcifications in the retroareolar right breast.  RECOMMENDATION: 1. Ultrasound-guided biopsy of the mass in the right breast at the 12 o'clock position is recommended.  3. Stereotactic guided biopsy of the calcifications in the retroareolar right breast is recommended.  Biopsies are being scheduled for the patient at the [facility].  I have discussed the findings and recommendations with the patient. Results were also provided in writing at the conclusion of the visit. If applicable, a reminder letter will be sent to the patient regarding the next appointment.  BI-RADS CATEGORY  5: Highly suggestive of malignancy.   Electronically Signed   By: Everlean Alstrom M.D.   On: 04/07/2017 13:06    PATHOLOGY:  (R) breast biopsy:  04/13/17     (R) axillary lymph node biopsy: 05/08/17         ASSESSMENT & PLAN:   Stage IIB invasive ductal carcinoma of (R) breast; ER+/PR+/HER2+:  -Recalled from screening mammogram in 03/2017 for (R) breast abnormality. Diagnostic mammogram/ultrasound of (R) breast suspicious for malignancy. Underwent (R) breast biopsy on 04/13/17 revealing invasive ductal carcinoma, grade 3, ER+, PR+, HER2+. Seen initially in consultation with Dr. Lindi Adie at [facility], and patient elected to have her treatment closer to home and presented to [facility] for cycle #1 neoadjuvant chemo with [facility] on 05/29/17. Plan is to complete 6 cycles of neoadjuvant TCHP before breast surgery. She wants Dr. Lucia Gaskins in [place] to do her surgery; we will help facilitate that in the future.   -Reviewed her MRI breast results with her in detail, she's had a slight decrease in her primary breast mass and her axillary lymphadenopathy. This was not as good of a response as we had hoped. Patient states she's thinking of going for bilateral mastectomies. -Plts are very low today at 49k. We need to hold her chemo for today. Reschedule cycle 5 for in 1 week. -Plan to send her for surgical evaluation after cycle 6.   Dispo:  -RTC in 1 week for cycle 5 of chemo. -RTC for follow up with cycle 6 of chemo.  All questions were answered to patient's stated satisfaction. Encouraged patient to call with any new concerns or questions before her next visit to the cancer center and we can certain see her sooner, if needed.    Twana First, mD

## 2017-09-09 ENCOUNTER — Ambulatory Visit (HOSPITAL_COMMUNITY): Payer: 59

## 2017-09-11 ENCOUNTER — Encounter (HOSPITAL_COMMUNITY): Payer: Self-pay

## 2017-09-11 ENCOUNTER — Other Ambulatory Visit: Payer: Self-pay

## 2017-09-11 ENCOUNTER — Encounter (HOSPITAL_BASED_OUTPATIENT_CLINIC_OR_DEPARTMENT_OTHER): Payer: 59

## 2017-09-11 VITALS — BP 119/56 | HR 83 | Temp 97.7°F | Resp 20 | Wt 250.6 lb

## 2017-09-11 DIAGNOSIS — C773 Secondary and unspecified malignant neoplasm of axilla and upper limb lymph nodes: Secondary | ICD-10-CM | POA: Diagnosis not present

## 2017-09-11 DIAGNOSIS — Z5111 Encounter for antineoplastic chemotherapy: Secondary | ICD-10-CM | POA: Diagnosis not present

## 2017-09-11 DIAGNOSIS — R739 Hyperglycemia, unspecified: Secondary | ICD-10-CM

## 2017-09-11 DIAGNOSIS — Z5112 Encounter for antineoplastic immunotherapy: Secondary | ICD-10-CM

## 2017-09-11 DIAGNOSIS — C50211 Malignant neoplasm of upper-inner quadrant of right female breast: Secondary | ICD-10-CM | POA: Diagnosis not present

## 2017-09-11 DIAGNOSIS — Z170 Estrogen receptor positive status [ER+]: Principal | ICD-10-CM

## 2017-09-11 LAB — CBC WITH DIFFERENTIAL/PLATELET
Basophils Absolute: 0 10*3/uL (ref 0.0–0.1)
Basophils Relative: 0 %
Eosinophils Absolute: 0.1 10*3/uL (ref 0.0–0.7)
Eosinophils Relative: 2 %
HCT: 33.8 % — ABNORMAL LOW (ref 36.0–46.0)
Hemoglobin: 10.9 g/dL — ABNORMAL LOW (ref 12.0–15.0)
Lymphocytes Relative: 23 %
Lymphs Abs: 1.5 10*3/uL (ref 0.7–4.0)
MCH: 32 pg (ref 26.0–34.0)
MCHC: 32.2 g/dL (ref 30.0–36.0)
MCV: 99.1 fL (ref 78.0–100.0)
Monocytes Absolute: 0.5 10*3/uL (ref 0.1–1.0)
Monocytes Relative: 8 %
Neutro Abs: 4.4 10*3/uL (ref 1.7–7.7)
Neutrophils Relative %: 67 %
Platelets: 139 10*3/uL — ABNORMAL LOW (ref 150–400)
RBC: 3.41 MIL/uL — ABNORMAL LOW (ref 3.87–5.11)
RDW: 16.1 % — ABNORMAL HIGH (ref 11.5–15.5)
WBC: 6.6 10*3/uL (ref 4.0–10.5)

## 2017-09-11 LAB — COMPREHENSIVE METABOLIC PANEL
ALT: 26 U/L (ref 14–54)
AST: 24 U/L (ref 15–41)
Albumin: 4.1 g/dL (ref 3.5–5.0)
Alkaline Phosphatase: 66 U/L (ref 38–126)
Anion gap: 8 (ref 5–15)
BUN: 25 mg/dL — ABNORMAL HIGH (ref 6–20)
CO2: 25 mmol/L (ref 22–32)
Calcium: 10.3 mg/dL (ref 8.9–10.3)
Chloride: 105 mmol/L (ref 101–111)
Creatinine, Ser: 0.89 mg/dL (ref 0.44–1.00)
GFR calc Af Amer: >60 mL/min (ref >60)
GFR calc non Af Amer: >60 mL/min (ref >60)
Glucose, Bld: 169 mg/dL — ABNORMAL HIGH (ref 65–99)
Potassium: 4 mmol/L (ref 3.5–5.1)
Sodium: 138 mmol/L (ref 135–145)
Total Bilirubin: 0.6 mg/dL (ref 0.3–1.2)
Total Protein: 7.2 g/dL (ref 6.5–8.1)

## 2017-09-11 MED ORDER — DIPHENHYDRAMINE HCL 25 MG PO CAPS
50.0000 mg | ORAL_CAPSULE | Freq: Once | ORAL | Status: AC
  Administered 2017-09-11: 50 mg via ORAL
  Filled 2017-09-11: qty 2

## 2017-09-11 MED ORDER — SODIUM CHLORIDE 0.9 % IV SOLN
420.0000 mg | Freq: Once | INTRAVENOUS | Status: DC
  Filled 2017-09-11: qty 14

## 2017-09-11 MED ORDER — SODIUM CHLORIDE 0.9% FLUSH
10.0000 mL | INTRAVENOUS | Status: DC | PRN
  Administered 2017-09-11: 10 mL
  Filled 2017-09-11: qty 10

## 2017-09-11 MED ORDER — SODIUM CHLORIDE 0.9 % IV SOLN: Freq: Once | INTRAVENOUS | Status: DC

## 2017-09-11 MED ORDER — ACETAMINOPHEN 325 MG PO TABS
650.0000 mg | ORAL_TABLET | Freq: Once | ORAL | Status: AC
  Administered 2017-09-11: 650 mg via ORAL
  Filled 2017-09-11: qty 2

## 2017-09-11 MED ORDER — DEXTROSE 5 % IV SOLN
75.0000 mg/m2 | Freq: Once | INTRAVENOUS | Status: AC
  Administered 2017-09-11: 170 mg via INTRAVENOUS
  Filled 2017-09-11: qty 17

## 2017-09-11 MED ORDER — SODIUM CHLORIDE 0.9 % IV SOLN
Freq: Once | INTRAVENOUS | Status: AC
  Administered 2017-09-11: 11:00:00 via INTRAVENOUS

## 2017-09-11 MED ORDER — DEXAMETHASONE SODIUM PHOSPHATE 10 MG/ML IJ SOLN
10.0000 mg | Freq: Once | INTRAMUSCULAR | Status: AC
  Administered 2017-09-11: 10 mg via INTRAVENOUS
  Filled 2017-09-11: qty 1

## 2017-09-11 MED ORDER — HEPARIN SOD (PORK) LOCK FLUSH 100 UNIT/ML IV SOLN
500.0000 [IU] | Freq: Once | INTRAVENOUS | Status: AC | PRN
  Administered 2017-09-11: 500 [IU]
  Filled 2017-09-11: qty 5

## 2017-09-11 MED ORDER — TRASTUZUMAB CHEMO 150 MG IV SOLR
750.0000 mg | Freq: Once | INTRAVENOUS | Status: AC
  Administered 2017-09-11: 750 mg via INTRAVENOUS
  Filled 2017-09-11: qty 35.72

## 2017-09-11 MED ORDER — SODIUM CHLORIDE 0.9 % IV SOLN: 420.0000 mg | Freq: Once | INTRAVENOUS | Status: DC

## 2017-09-11 MED ORDER — PALONOSETRON HCL INJECTION 0.25 MG/5ML
0.2500 mg | Freq: Once | INTRAVENOUS | Status: AC
  Administered 2017-09-11: 0.25 mg via INTRAVENOUS
  Filled 2017-09-11: qty 5

## 2017-09-11 MED ORDER — PEGFILGRASTIM 6 MG/0.6ML ~~LOC~~ PSKT
6.0000 mg | PREFILLED_SYRINGE | Freq: Once | SUBCUTANEOUS | Status: AC
  Administered 2017-09-11: 6 mg via SUBCUTANEOUS
  Filled 2017-09-11: qty 0.6

## 2017-09-11 MED ORDER — SODIUM CHLORIDE 0.9 % IV SOLN
900.0000 mg | Freq: Once | INTRAVENOUS | Status: AC
  Administered 2017-09-11: 900 mg via INTRAVENOUS
  Filled 2017-09-11: qty 90

## 2017-09-11 NOTE — Progress Notes (Signed)
For treatment today with no complaints voiced.    Labs reviewed with Dr. Talbert Cage and ok to treat today.   Patient tolerated chemotherapy with no complaints voiced.  Port site clean and dry with no bruising or swelling noted at site.  Band aid applied.  Neulasta Onpro applied to left arm with green indicator light flashing.  No complaints with Onpro.  VSS with discharge and left ambulatory with no s/s of distress noted.  To return on Monday for Perjeta infusion.

## 2017-09-11 NOTE — Patient Instructions (Signed)
[facility] Discharge Instructions for Patients Receiving Chemotherapy  Today you received the following chemotherapy agents Carboplatin, Taxotere, and herceptin.    If you develop nausea and vomiting that is not controlled by your nausea medication, call the clinic.   BELOW ARE SYMPTOMS THAT SHOULD BE REPORTED IMMEDIATELY:  *FEVER GREATER THAN 100.5 F  *CHILLS WITH OR WITHOUT FEVER  NAUSEA AND VOMITING THAT IS NOT CONTROLLED WITH YOUR NAUSEA MEDICATION  *UNUSUAL SHORTNESS OF BREATH  *UNUSUAL BRUISING OR BLEEDING  TENDERNESS IN MOUTH AND THROAT WITH OR WITHOUT PRESENCE OF ULCERS  *URINARY PROBLEMS  *BOWEL PROBLEMS  UNUSUAL RASH Items with * indicate a potential emergency and should be followed up as soon as possible.  Feel free to call the clinic should you have any questions or concerns. The clinic phone number is [phone].  Please show the [facility] at check-in to the Emergency Department and triage nurse.

## 2017-09-14 ENCOUNTER — Other Ambulatory Visit: Payer: Self-pay

## 2017-09-14 ENCOUNTER — Encounter (HOSPITAL_COMMUNITY): Payer: 59 | Attending: Hematology and Oncology

## 2017-09-14 ENCOUNTER — Encounter (HOSPITAL_COMMUNITY): Payer: Self-pay

## 2017-09-14 VITALS — BP 100/58 | HR 72 | Temp 98.0°F | Resp 18 | Wt 249.2 lb

## 2017-09-14 DIAGNOSIS — I10 Essential (primary) hypertension: Secondary | ICD-10-CM | POA: Insufficient documentation

## 2017-09-14 DIAGNOSIS — Z7984 Long term (current) use of oral hypoglycemic drugs: Secondary | ICD-10-CM | POA: Insufficient documentation

## 2017-09-14 DIAGNOSIS — Z79899 Other long term (current) drug therapy: Secondary | ICD-10-CM | POA: Insufficient documentation

## 2017-09-14 DIAGNOSIS — C50211 Malignant neoplasm of upper-inner quadrant of right female breast: Secondary | ICD-10-CM | POA: Diagnosis not present

## 2017-09-14 DIAGNOSIS — Z9049 Acquired absence of other specified parts of digestive tract: Secondary | ICD-10-CM | POA: Insufficient documentation

## 2017-09-14 DIAGNOSIS — E785 Hyperlipidemia, unspecified: Secondary | ICD-10-CM | POA: Insufficient documentation

## 2017-09-14 DIAGNOSIS — Z801 Family history of malignant neoplasm of trachea, bronchus and lung: Secondary | ICD-10-CM | POA: Insufficient documentation

## 2017-09-14 DIAGNOSIS — Z170 Estrogen receptor positive status [ER+]: Secondary | ICD-10-CM | POA: Insufficient documentation

## 2017-09-14 DIAGNOSIS — G4700 Insomnia, unspecified: Secondary | ICD-10-CM | POA: Insufficient documentation

## 2017-09-14 DIAGNOSIS — E119 Type 2 diabetes mellitus without complications: Secondary | ICD-10-CM | POA: Insufficient documentation

## 2017-09-14 DIAGNOSIS — Z87442 Personal history of urinary calculi: Secondary | ICD-10-CM | POA: Insufficient documentation

## 2017-09-14 DIAGNOSIS — Z90710 Acquired absence of both cervix and uterus: Secondary | ICD-10-CM | POA: Insufficient documentation

## 2017-09-14 DIAGNOSIS — R197 Diarrhea, unspecified: Secondary | ICD-10-CM | POA: Insufficient documentation

## 2017-09-14 DIAGNOSIS — Z5112 Encounter for antineoplastic immunotherapy: Secondary | ICD-10-CM | POA: Diagnosis not present

## 2017-09-14 DIAGNOSIS — Z9221 Personal history of antineoplastic chemotherapy: Secondary | ICD-10-CM | POA: Insufficient documentation

## 2017-09-14 DIAGNOSIS — R634 Abnormal weight loss: Secondary | ICD-10-CM | POA: Insufficient documentation

## 2017-09-14 MED ORDER — DIPHENHYDRAMINE HCL 25 MG PO CAPS
ORAL_CAPSULE | ORAL | Status: AC
  Filled 2017-09-14: qty 1

## 2017-09-14 MED ORDER — HEPARIN SOD (PORK) LOCK FLUSH 100 UNIT/ML IV SOLN
500.0000 [IU] | Freq: Once | INTRAVENOUS | Status: AC
  Administered 2017-09-14: 500 [IU] via INTRAVENOUS
  Filled 2017-09-14: qty 5

## 2017-09-14 MED ORDER — ACETAMINOPHEN 325 MG PO TABS
ORAL_TABLET | ORAL | Status: AC
  Filled 2017-09-14: qty 2

## 2017-09-14 MED ORDER — SODIUM CHLORIDE 0.9 % IV SOLN
INTRAVENOUS | Status: DC
  Administered 2017-09-14: 10:00:00 via INTRAVENOUS

## 2017-09-14 MED ORDER — SODIUM CHLORIDE 0.9 % IV SOLN
420.0000 mg | Freq: Once | INTRAVENOUS | Status: AC
  Administered 2017-09-14: 420 mg via INTRAVENOUS
  Filled 2017-09-14: qty 14

## 2017-09-14 MED ORDER — ACETAMINOPHEN 325 MG PO TABS
650.0000 mg | ORAL_TABLET | Freq: Once | ORAL | Status: AC
  Administered 2017-09-14: 650 mg via ORAL

## 2017-09-14 MED ORDER — SODIUM CHLORIDE 0.9% FLUSH
10.0000 mL | INTRAVENOUS | Status: DC | PRN
  Administered 2017-09-14: 10 mL via INTRAVENOUS
  Filled 2017-09-14: qty 10

## 2017-09-14 MED ORDER — DIPHENHYDRAMINE HCL 25 MG PO TABS
25.0000 mg | ORAL_TABLET | Freq: Once | ORAL | Status: AC
  Administered 2017-09-14: 25 mg via ORAL
  Filled 2017-09-14: qty 1

## 2017-09-14 NOTE — Patient Instructions (Signed)
[facility] Discharge Instructions for Patients Receiving Chemotherapy   Beginning January 23rd 2017 lab work for the Cancer Center will be done in the  Main lab at  on 1st floor. If you have a lab appointment with the Cancer Center please come in thru the  Main Entrance and check in at the main information desk   Today you received the following chemotherapy agents opdivo  To help prevent nausea and vomiting after your treatment, we encourage you to take your nausea medication     If you develop nausea and vomiting, or diarrhea that is not controlled by your medication, call the clinic.  The clinic phone number is [phone]. Office hours are Monday-Friday 8:30am-5:00pm.  BELOW ARE SYMPTOMS THAT SHOULD BE REPORTED IMMEDIATELY:  *FEVER GREATER THAN 101.0 F  *CHILLS WITH OR WITHOUT FEVER  NAUSEA AND VOMITING THAT IS NOT CONTROLLED WITH YOUR NAUSEA MEDICATION  *UNUSUAL SHORTNESS OF BREATH  *UNUSUAL BRUISING OR BLEEDING  TENDERNESS IN MOUTH AND THROAT WITH OR WITHOUT PRESENCE OF ULCERS  *URINARY PROBLEMS  *BOWEL PROBLEMS  UNUSUAL RASH Items with * indicate a potential emergency and should be followed up as soon as possible. If you have an emergency after office hours please contact your primary care physician or go to the nearest emergency department.  Please call the clinic during office hours if you have any questions or concerns.   You may also contact the Patient Navigator at [phone] should you have any questions or need assistance in obtaining follow up care.      Resources For Cancer Patients and their Caregivers ? American Cancer Society: Can assist with transportation, wigs, general needs, runs Look Good Feel Better.        1-888-227-6333 ? Cancer Care: Provides financial assistance, online support groups, medication/co-pay assistance.  1-800-813-HOPE (4673) ? [facility] Assists [place]  cancer patients and their families through emotional , educational and financial support.  336-427-4357 ? [place] DSS Where to apply for food stamps, Medicaid and utility assistance. 336-342-1394 ? [facility]: Transportation to medical appointments. 336-347-2287 ? Social Security Administration: May apply for disability if have a Stage IV cancer. 336-342-7796 1-800-772-1213 ? [place] Aging, Disability and Transit Services: Assists with nutrition, care and transit needs. 336-349-2343         

## 2017-09-14 NOTE — Progress Notes (Signed)
Treatment given per orders. Patient tolerated it well without problems. Vitals stable and discharged home from clinic ambulatory. Follow up as scheduled.  

## 2017-09-26 ENCOUNTER — Other Ambulatory Visit: Payer: Self-pay | Admitting: Hematology and Oncology

## 2017-09-26 ENCOUNTER — Other Ambulatory Visit (HOSPITAL_COMMUNITY): Payer: Self-pay | Admitting: Adult Health

## 2017-09-26 DIAGNOSIS — Z170 Estrogen receptor positive status [ER+]: Principal | ICD-10-CM

## 2017-09-26 DIAGNOSIS — T451X5A Adverse effect of antineoplastic and immunosuppressive drugs, initial encounter: Secondary | ICD-10-CM

## 2017-09-26 DIAGNOSIS — C50211 Malignant neoplasm of upper-inner quadrant of right female breast: Secondary | ICD-10-CM

## 2017-09-26 DIAGNOSIS — K521 Toxic gastroenteritis and colitis: Secondary | ICD-10-CM

## 2017-09-26 DIAGNOSIS — R12 Heartburn: Secondary | ICD-10-CM

## 2017-09-30 ENCOUNTER — Ambulatory Visit (HOSPITAL_COMMUNITY): Payer: 59

## 2017-10-02 ENCOUNTER — Encounter (HOSPITAL_COMMUNITY): Payer: Self-pay | Admitting: Adult Health

## 2017-10-02 ENCOUNTER — Other Ambulatory Visit: Payer: Self-pay

## 2017-10-02 ENCOUNTER — Encounter (HOSPITAL_BASED_OUTPATIENT_CLINIC_OR_DEPARTMENT_OTHER): Payer: 59 | Admitting: Adult Health

## 2017-10-02 ENCOUNTER — Encounter (HOSPITAL_COMMUNITY): Payer: 59

## 2017-10-02 DIAGNOSIS — C773 Secondary and unspecified malignant neoplasm of axilla and upper limb lymph nodes: Secondary | ICD-10-CM | POA: Diagnosis not present

## 2017-10-02 DIAGNOSIS — Z9049 Acquired absence of other specified parts of digestive tract: Secondary | ICD-10-CM | POA: Diagnosis not present

## 2017-10-02 DIAGNOSIS — D696 Thrombocytopenia, unspecified: Secondary | ICD-10-CM | POA: Diagnosis not present

## 2017-10-02 DIAGNOSIS — R739 Hyperglycemia, unspecified: Secondary | ICD-10-CM

## 2017-10-02 DIAGNOSIS — Z90710 Acquired absence of both cervix and uterus: Secondary | ICD-10-CM | POA: Diagnosis not present

## 2017-10-02 DIAGNOSIS — C50211 Malignant neoplasm of upper-inner quadrant of right female breast: Secondary | ICD-10-CM | POA: Diagnosis not present

## 2017-10-02 DIAGNOSIS — Z170 Estrogen receptor positive status [ER+]: Principal | ICD-10-CM

## 2017-10-02 DIAGNOSIS — R5383 Other fatigue: Secondary | ICD-10-CM

## 2017-10-02 DIAGNOSIS — K1379 Other lesions of oral mucosa: Secondary | ICD-10-CM | POA: Diagnosis not present

## 2017-10-02 DIAGNOSIS — Z801 Family history of malignant neoplasm of trachea, bronchus and lung: Secondary | ICD-10-CM | POA: Diagnosis not present

## 2017-10-02 DIAGNOSIS — Z7984 Long term (current) use of oral hypoglycemic drugs: Secondary | ICD-10-CM | POA: Diagnosis not present

## 2017-10-02 DIAGNOSIS — E119 Type 2 diabetes mellitus without complications: Secondary | ICD-10-CM | POA: Diagnosis not present

## 2017-10-02 DIAGNOSIS — Z87442 Personal history of urinary calculi: Secondary | ICD-10-CM | POA: Diagnosis not present

## 2017-10-02 DIAGNOSIS — T451X5A Adverse effect of antineoplastic and immunosuppressive drugs, initial encounter: Secondary | ICD-10-CM

## 2017-10-02 DIAGNOSIS — R197 Diarrhea, unspecified: Secondary | ICD-10-CM | POA: Diagnosis not present

## 2017-10-02 DIAGNOSIS — Z5112 Encounter for antineoplastic immunotherapy: Secondary | ICD-10-CM

## 2017-10-02 DIAGNOSIS — Z79899 Other long term (current) drug therapy: Secondary | ICD-10-CM | POA: Diagnosis not present

## 2017-10-02 DIAGNOSIS — Z9221 Personal history of antineoplastic chemotherapy: Secondary | ICD-10-CM | POA: Diagnosis not present

## 2017-10-02 DIAGNOSIS — R634 Abnormal weight loss: Secondary | ICD-10-CM | POA: Diagnosis not present

## 2017-10-02 DIAGNOSIS — G4700 Insomnia, unspecified: Secondary | ICD-10-CM | POA: Diagnosis not present

## 2017-10-02 DIAGNOSIS — E785 Hyperlipidemia, unspecified: Secondary | ICD-10-CM | POA: Diagnosis not present

## 2017-10-02 DIAGNOSIS — D6959 Other secondary thrombocytopenia: Secondary | ICD-10-CM

## 2017-10-02 DIAGNOSIS — I10 Essential (primary) hypertension: Secondary | ICD-10-CM | POA: Diagnosis not present

## 2017-10-02 LAB — CBC WITH DIFFERENTIAL/PLATELET
Basophils Absolute: 0 10*3/uL (ref 0.0–0.1)
Basophils Relative: 0 %
EOS ABS: 0 10*3/uL (ref 0.0–0.7)
EOS PCT: 0 %
HCT: 28 % — ABNORMAL LOW (ref 36.0–46.0)
HEMOGLOBIN: 9.1 g/dL — AB (ref 12.0–15.0)
LYMPHS ABS: 1.5 10*3/uL (ref 0.7–4.0)
Lymphocytes Relative: 36 %
MCH: 31.7 pg (ref 26.0–34.0)
MCHC: 32.5 g/dL (ref 30.0–36.0)
MCV: 97.6 fL (ref 78.0–100.0)
MONOS PCT: 13 %
Monocytes Absolute: 0.5 10*3/uL (ref 0.1–1.0)
NEUTROS PCT: 51 %
Neutro Abs: 2.1 10*3/uL (ref 1.7–7.7)
Platelets: 80 10*3/uL — ABNORMAL LOW (ref 150–400)
RBC: 2.87 MIL/uL — ABNORMAL LOW (ref 3.87–5.11)
RDW: 18.6 % — ABNORMAL HIGH (ref 11.5–15.5)
WBC: 4.1 10*3/uL (ref 4.0–10.5)

## 2017-10-02 LAB — COMPREHENSIVE METABOLIC PANEL
ALK PHOS: 72 U/L (ref 38–126)
ALT: 24 U/L (ref 14–54)
ANION GAP: 10 (ref 5–15)
AST: 25 U/L (ref 15–41)
Albumin: 4 g/dL (ref 3.5–5.0)
BUN: 25 mg/dL — ABNORMAL HIGH (ref 6–20)
CALCIUM: 10.5 mg/dL — AB (ref 8.9–10.3)
CO2: 23 mmol/L (ref 22–32)
Chloride: 105 mmol/L (ref 101–111)
Creatinine, Ser: 0.93 mg/dL (ref 0.44–1.00)
GFR calc non Af Amer: >60 mL/min (ref >60)
Glucose, Bld: 115 mg/dL — ABNORMAL HIGH (ref 65–99)
Potassium: 4 mmol/L (ref 3.5–5.1)
SODIUM: 138 mmol/L (ref 135–145)
TOTAL PROTEIN: 7.1 g/dL (ref 6.5–8.1)
Total Bilirubin: 0.9 mg/dL (ref 0.3–1.2)

## 2017-10-02 MED ORDER — HEPARIN SOD (PORK) LOCK FLUSH 100 UNIT/ML IV SOLN
500.0000 [IU] | Freq: Once | INTRAVENOUS | Status: AC
  Administered 2017-10-02: 500 [IU] via INTRAVENOUS

## 2017-10-02 NOTE — Progress Notes (Signed)
Nutrition Follow-up:  Patient with breast cancer and receiving chemotherapy. Treatment held today due to labs.  Met with patient and sister briefly before leaving clinic.  Patient frustrated that she could not receive treatment today.  Reports that she continues to eat well.  "I know I need to eat my protein."  Reports she drinks glucerna shake in the morning usually.  Reports usually some diarrhea following chemotherapy but manageable.    No nutrition impact symptoms reported  Medications: reviewed  Labs: reviewed  Anthropometrics:   Weight 245 lb 8 oz today increased from 242 lb on 11/16 (last nutrition follow-up)   NUTRITION DIAGNOSIS: Inadequate oral intake improved   INTERVENTION:   Encouraged patient to continue good sources of protein and to keep check on blood glucose.      MONITORING, EVALUATION, GOAL: weight trends, intake, glucose   NEXT VISIT: as needed  Zenia Guest B. Zenia Resides, [place], [place] Registered Dietitian [phone] (pager)

## 2017-10-02 NOTE — Progress Notes (Signed)
Treatment held today per MD.Platelets are 80,000.  Vitals stable and discharged home from clinic ambulatory. Follow up as scheduled.

## 2017-10-02 NOTE — Progress Notes (Signed)
[place], [place] [address]   CLINIC:  Medical Oncology/Hematology  PCP:  Sharilyn Sites, MD [place] [address] [phone]   REASON FOR VISIT:  Follow-up for Stage IIB invasive ductal carcinoma of (R) breast; ER+/PR+/HER2+  CURRENT THERAPY: Neoadjuvant Taxotere/Carbo/Herceptin/Perjeta every 3 weeks, beginning 05/29/17   BRIEF ONCOLOGIC HISTORY:    Malignant neoplasm of upper-inner quadrant of right breast in female, estrogen receptor positive ([place])   04/13/2017 Initial Diagnosis    Screening detected right breast mass with calcifications 2.1 cm in size and 3.4 cm in size. Calcifications were fibroadenoma. Right breast biopsy upper inner quadrant 1:00 mass: IDC with DCIS with necrosis, grade 3, ER 100%, PR 70%, Ki-67 60%, HER-2 positive ratio 2.34, T2 N1 stage IB (New AJCC)      04/25/2017 Breast MRI    Right breast upper inner quadrant 2.2 x 1.7 x 2.7 cm mass, abnormal area of clumped non-mass enhancement in the right lateral breast 3.2 x 1.9 x 1 cm, too abnormal lymph nodes right axilla 4.3 and 1.7 cm (biopsy-proven breast cancer)       05/08/2017 Procedure    Right axilla lymph node biopsy: Metastatic carcinoma      05/13/2017 Procedure    Right breast biopsy retroareolar region: Intraductal papilloma         INTERVAL HISTORY:  Renee Harris 56 y.o. female returns for routine follow-up for right breast cancer.    Due for cycle #6 TCHP chemo today.   Overall, she tells me she has been feeling well. Appetite and energy levels both 75%.  She has intermittent fatigue, mild mucositis, easy bruising, and doesn't sleep well. Otherwise, she is largely without other complaints today.  She is really looking forward to being finished "with the chemo part of my treatment."   Denies fever/chills, shortness of breath, chest pain, abdominal pain, diarrhea, N&V, dizziness, headaches, or peripheral neuropathy.   She and her  sister want to know if she should have genetic counseling/testing.      REVIEW OF SYSTEMS:  Review of Systems  Constitutional: Positive for fatigue. Negative for chills and fever.  HENT:   Positive for mouth sores. Negative for lump/mass and nosebleeds.   Eyes: Negative.   Respiratory: Negative.  Negative for cough and shortness of breath.   Cardiovascular: Negative.  Negative for chest pain and leg swelling.  Gastrointestinal: Negative.  Negative for abdominal pain, blood in stool, constipation, diarrhea, nausea and vomiting.  Endocrine: Negative.   Genitourinary: Negative.  Negative for dysuria and hematuria.   Musculoskeletal: Negative.  Negative for arthralgias.  Skin: Negative.  Negative for rash.  Neurological: Negative.  Negative for dizziness and headaches.  Hematological: Negative for adenopathy. Bruises/bleeds easily.  Psychiatric/Behavioral: Positive for sleep disturbance. Negative for depression. The patient is not nervous/anxious.      PAST MEDICAL/SURGICAL HISTORY:  Past Medical History:  Diagnosis Date  . Asthma   . Blood transfusion    as child  . Diabetes ([place])    type 2   . Heart murmur    d/t aortic stenosis   . History of kidney stones   . Hyperlipidemia   . Hypertension   . Normal echocardiogram   . Sleep apnea    Past Surgical History:  Procedure Laterality Date  . ABDOMINAL HYSTERECTOMY  2004  . CHOLECYSTECTOMY  05/30/2011   Procedure: LAPAROSCOPIC CHOLECYSTECTOMY;  Surgeon: Donato Heinz;  Location: [facility];  Service: General;  Laterality: N/A;  .  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 KIDNEY STONE SURGERY     2017 2-23  . PORTACATH PLACEMENT N/A 05/25/2017   Procedure: INSERTION PORT-A-CATH;  Surgeon: Alphonsa Overall, MD;  Location: [facility];  Service: General;  Laterality: N/A;     SOCIAL HISTORY:  Social History   Socioeconomic History  . Marital status: Married    Spouse name: Not on file  . Number of children:  Not on file  . Years of education: Not on file  . Highest education level: Not on file  Social Needs  . Financial resource strain: Not on file  . Food insecurity - worry: Not on file  . Food insecurity - inability: Not on file  . Transportation needs - medical: Not on file  . Transportation needs - non-medical: Not on file  Occupational History  . Not on file  Tobacco Use  . Smoking status: Never Smoker  . Smokeless tobacco: Never Used  Substance and Sexual Activity  . Alcohol use: Yes    Comment: 2 x year  . Drug use: No  . Sexual activity: Yes    Birth control/protection: Surgical  Other Topics Concern  . Not on file  Social History Narrative  . Not on file    FAMILY HISTORY:  Family History  Problem Relation Age of Onset  . Lung cancer Maternal Grandmother   . Anesthesia problems Neg Hx   . Hypotension Neg Hx   . Malignant hyperthermia Neg Hx   . Pseudochol deficiency Neg Hx     CURRENT MEDICATIONS:  Outpatient Encounter Medications as of 10/02/2017  Medication Sig Note  . acetaminophen (TYLENOL) 325 MG tablet Take 650 mg by mouth every 6 (six) hours as needed.   Marland Kitchen albuterol (PROVENTIL HFA;VENTOLIN HFA) 108 (90 BASE) MCG/ACT inhaler Inhale 1 puff into the lungs every 6 (six) hours as needed for wheezing or shortness of breath.    . Black Cohosh 40 MG CAPS Take 40 mg by mouth at bedtime.    Marland Kitchen dexamethasone (DECADRON) 4 MG tablet Take 1 tablet (4 mg total) by mouth daily. 1 tab day before chemo and 1 tab day after 05/20/2017: Will start when starts chemtherapy   . diphenhydrAMINE (BENADRYL) 25 mg capsule Take 25 mg by mouth at bedtime as needed for allergies. For allergies    . diphenoxylate-atropine (LOMOTIL) 2.5-0.025 MG tablet TAKE (1) TABLET BY MOUTH (4) TIMES DAILY AS NEEDED FOR DIARRHEA OR LOOSE STOOLS.   Marland Kitchen escitalopram (LEXAPRO) 20 MG tablet Take 20 mg by mouth at bedtime.    . fenofibrate 160 MG tablet Take 160 mg by mouth at bedtime.    Marland Kitchen  HYDROcodone-acetaminophen (NORCO/VICODIN) 5-325 MG tablet Take 1 tablet by mouth every 6 (six) hours as needed for moderate pain.   Marland Kitchen lidocaine-prilocaine (EMLA) cream Apply to affected area once 05/20/2017: Will start when starts chemtherapy   . losartan (COZAAR) 50 MG tablet Take 25 mg by mouth at bedtime.    . magic mouthwash w/lidocaine SOLN Take 5 mLs by mouth 4 (four) times daily as needed for mouth pain.   . metFORMIN (GLUCOPHAGE) 500 MG tablet Take 500 mg by mouth 2 (two) times daily with a meal.   . Multiple Vitamin (MULTIVITAMIN) tablet Take 1 tablet by mouth daily.   Marland Kitchen omeprazole (PRILOSEC) 40 MG capsule TAKE ONE CAPSULE BY MOUTH ONCE DAILY.   Marland Kitchen ondansetron (ZOFRAN) 8 MG tablet TAKE (1) TABLET TWICE A DAY  AS NEEDED FOR NAUSEA/VOMITING. START ON DAY 3 AFTER CHEMO.   . prochlorperazine (COMPAZINE) 10 MG tablet TAKE (1) TABLET BY MOUTH EVERY SIX HOURS AS NEEDED FOR NAUSEA AND VOMITING.    No facility-administered encounter medications on file as of 10/02/2017.     ALLERGIES:  Allergies  Allergen Reactions  . Latex Itching  . Pyridostigmine Bromide Other (See Comments)    Makes muscles twitch  . Statins Nausea Only     PHYSICAL EXAM:  ECOG Performance status: 1-2 - Symptomatic; requires occasional assistance.   BP 132/71 HR 82 Resp 16 Temp 98.1 O2 sat 97%  Weight: 245.8 lbs    Physical Exam  Constitutional: She is oriented to person, place, and time and well-developed, well-nourished, and in no distress.  Seen in chemo bed in infusion area   HENT:  Head: Normocephalic.  Mouth/Throat: Oropharynx is clear and moist.  Eyes: Conjunctivae are normal. No scleral icterus.  Neck: Normal range of motion. Neck supple.  Cardiovascular: Normal rate and regular rhythm.  Pulmonary/Chest: Effort normal and breath sounds normal. No respiratory distress.  Abdominal: Soft. Bowel sounds are normal. There is no tenderness.  Musculoskeletal: Normal range of motion. She exhibits no  edema.  Lymphadenopathy:    She has no cervical adenopathy.       Right: No supraclavicular adenopathy present.       Left: No supraclavicular adenopathy present.  Neurological: She is alert and oriented to person, place, and time. No cranial nerve deficit.  Skin: Skin is warm and dry. No rash noted.  Psychiatric: Mood, memory, affect and judgment normal.  Nursing note and vitals reviewed.    LABORATORY DATA:  I have reviewed the labs as listed.  CBC    Component Value Date/Time   WBC 4.1 10/02/2017 0911   RBC 2.87 (L) 10/02/2017 0911   HGB 9.1 (L) 10/02/2017 0911   HGB 13.8 04/22/2017 0831   HCT 28.0 (L) 10/02/2017 0911   HCT 41.9 04/22/2017 0831   PLT 80 (L) 10/02/2017 0911   PLT 184 04/22/2017 0831   MCV 97.6 10/02/2017 0911   MCV 85.7 04/22/2017 0831   MCH 31.7 10/02/2017 0911   MCHC 32.5 10/02/2017 0911   RDW 18.6 (H) 10/02/2017 0911   RDW 13.9 04/22/2017 0831   LYMPHSABS 1.5 10/02/2017 0911   LYMPHSABS 2.1 04/22/2017 0831   MONOABS 0.5 10/02/2017 0911   MONOABS 0.5 04/22/2017 0831   EOSABS 0.0 10/02/2017 0911   EOSABS 0.3 04/22/2017 0831   BASOSABS 0.0 10/02/2017 0911   BASOSABS 0.0 04/22/2017 0831   CMP Latest Ref Rng & Units 10/02/2017 09/11/2017 08/28/2017  Glucose 65 - 99 mg/dL 115(H) 169(H) 121(H)  BUN 6 - 20 mg/dL 25(H) 25(H) 21(H)  Creatinine 0.44 - 1.00 mg/dL 0.93 0.89 0.93  Sodium 135 - 145 mmol/L 138 138 136  Potassium 3.5 - 5.1 mmol/L 4.0 4.0 3.8  Chloride 101 - 111 mmol/L 105 105 103  CO2 22 - 32 mmol/L '23 25 25  ' Calcium 8.9 - 10.3 mg/dL 10.5(H) 10.3 10.2  Total Protein 6.5 - 8.1 g/dL 7.1 7.2 6.9  Total Bilirubin 0.3 - 1.2 mg/dL 0.9 0.6 0.6  Alkaline Phos 38 - 126 U/L 72 66 60  AST 15 - 41 U/L '25 24 23  ' ALT 14 - 54 U/L '24 26 23    ' PENDING LABS:    DIAGNOSTIC IMAGING:  *The following radiologic images and reports have been reviewed independently and agree with below findings.  Mammogram: 04/07/17 CLINICAL DATA:  Screening recall for right  breast mass and calcifications. In addition, the patient states she now feels a palpable abnormality in her upper right breast since her recent screening mammogram.  EXAM: 2D DIGITAL DIAGNOSTIC UNILATERAL RIGHT MAMMOGRAM WITH CAD AND ADJUNCT TOMO  RIGHT BREAST ULTRASOUND  COMPARISON:  Screening mammogram dated 03/23/2017.  ACR Breast Density Category b: There are scattered areas of fibroglandular density.  FINDINGS: Cc and MLO tomograms of the right breast as well as spot compression magnification views of the right breast were performed. There is an irregular spiculated mass with associated microcalcifications in the superior right breast measuring approximately 2.1 cm. Spot compression magnification views of the retroareolar right breast demonstrate loosely grouped predominantly coarse calcifications, however some of which are amorphous and suspicious, spanning a distance of approximately 3.4 cm.  Mammographic images were processed with CAD.  Physical examination of the upper right breast reveals a firm fixed nodule at the approximate 12 o'clock position.  Targeted ultrasound of the right breast was performed demonstrating an irregular hypoechoic mass at 12 o'clock 9 cm from nipple measuring 2 x 1.3 x 2.1 cm. This corresponds well with the mass seen at mammography. No definite lymphadenopathy seen in the right axilla.  IMPRESSION: 1. Highly suspicious right breast mass with associated calcifications measuring 2.1 cm.  2. Additional indeterminate calcifications in the retroareolar right breast.  RECOMMENDATION: 1. Ultrasound-guided biopsy of the mass in the right breast at the 12 o'clock position is recommended.  3. Stereotactic guided biopsy of the calcifications in the retroareolar right breast is recommended.  Biopsies are being scheduled for the patient at the [facility].  I have discussed the findings and recommendations with  the patient. Results were also provided in writing at the conclusion of the visit. If applicable, a reminder letter will be sent to the patient regarding the next appointment.  BI-RADS CATEGORY  5: Highly suggestive of malignancy.   Electronically Signed   By: Everlean Alstrom M.D.   On: 04/07/2017 13:06   Last ECHO: 08/10/17  *[facility]*                         [place], [address] [address]                            [phone]  ------------------------------------------------------------------- Transthoracic Echocardiography  Patient:    Porsche, Noguchi MR #:       [id number] Study Date: 08/10/2017 Gender:     F Age:        67 Height:     154.9 cm Weight:     112.5 kg BSA:        2.27 m^2 Pt. Status: Room:   ATTENDING    Default, Provider [address], [place]  Elenore Paddy, M.D.  REFERRING    Loralie Champagne, M.D.  PERFORMING   [facility], Outpatient  SONOGRAPHER  Chelsea Androw  cc:  ------------------------------------------------------------------- LV EF: 65% -   70%  ------------------------------------------------------------------- Indications:      (Chemo I74.9).  ------------------------------------------------------------------- History:   PMH:  Breast cancer  Risk  factors:  Murmur Hypertension. Diabetes mellitus. Dyslipidemia.  ------------------------------------------------------------------- Study Conclusions  - Left ventricle: The cavity size was normal. Wall thickness was   increased in a pattern of mild LVH. Systolic function was   vigorous. The estimated ejection fraction was in the range of 65%   to 70%. Wall motion was normal; there were no regional wall   motion abnormalities. Doppler parameters are consistent with   abnormal left ventricular relaxation (grade 1 diastolic   dysfunction). GLS -19.6%. - Aortic  valve: Trileaflet; moderately calcified leaflets. There   was moderate stenosis. There was trivial regurgitation. Mean   gradient (S): 26 mm Hg. Peak gradient (S): 52 mm Hg. Valve area   (VTI): 1.3 cm^2. - Mitral valve: Moderately calcified annulus. - Right ventricle: The cavity size was normal. Systolic function   was normal. - Pulmonary arteries: No complete TR doppler jet unable to estimate   PA systolic pressure. - Inferior vena cava: The vessel was normal in size. The   respirophasic diameter changes were in the normal range (>= 50%),   consistent with normal central venous pressure.  Impressions:  - Normal LV size with mild LV hypertrophy. EF 65-70%. Strain as   above. Normal RV size and systolic function. Moderate aortic   stenosis.       PATHOLOGY:  (R) breast biopsy: 04/13/17     (R) axillary lymph node biopsy: 05/08/17         ASSESSMENT & PLAN:   Stage IIB invasive ductal carcinoma of (R) breast; ER+/PR+/HER2+:  -Recalled from screening mammogram in 03/2017 for (R) breast abnormality. Diagnostic mammogram/ultrasound of (R) breast suspicious for malignancy. Underwent (R) breast biopsy on 04/13/17 revealing invasive ductal carcinoma, grade 3, ER+, PR+, HER2+. Seen initially in consultation with Dr. Lindi Adie at [facility], and patient elected to have her treatment closer to home and presented to [facility] for [facility]. Plan is to complete 6 cycles of neoadjuvant TCHP before breast surgery. She wants Dr. Lucia Gaskins in [place] to do her surgery; we will help facilitate that in the future.   -Due for cycle #6 TCHP today. Unfortunately, her labs do not satisfy treatment parameters. Plts 80,000 today.  Will hold chemo today and have her return to cancer center in ~1 week to re-challenge cycle #6 TCHP at that time.   -Return to cancer center next week for follow-up. Will refer her back to Dr. Lucia Gaskins for surgical evaluation after she  likely will complete chemotherapy next week.  -Last ECHO showed EF 65-70% on 08/10/17; she will be due for next cardiac evaluation with ECHO in late 10/2017; will place orders at next visit.  -Reviewed with her possible trajectory of her treatment plan from here; she understands she will need breast surgery, possibly adjuvant radiation therapy, anti-estrogen therapy, and 1 year of anti-HER2 therapy with Herceptin +/- Perjeta.    Genetic counseling:  -NCCN Guidelines for genetic counseling for breast cancer reviewed with patient today. She may qualify for genetic testing given that her paternal aunt had ovarian cancer. Discussed process of referral for genetics; she does not think genetic testing will change her mind about her surgery choice. Therefore, she wants to think about this more and will let me know if she would like referral.  We can discuss further at next visit.             Dispo:  -Return to cancer center in 1 week to re-challenge cycle #6 TCHP chemo.  -Return to  cancer center for follow-up next week with chemo.     All questions were answered to patient's stated satisfaction. Encouraged patient to call with any new concerns or questions before her next visit to the cancer center and we can certain see her sooner, if needed.    Plan of care discussed with Dr. Talbert Cage, who agrees with the above aforementioned.    Orders placed this encounter:  No orders of the defined types were placed in this encounter.     Mike Craze, NP [place] [phone]

## 2017-10-02 NOTE — Patient Instructions (Signed)
[place] at [facility] Discharge Instructions  RECOMMENDATIONS MADE BY THE CONSULTANT AND ANY TEST RESULTS WILL BE SENT TO YOUR REFERRING PHYSICIAN.  One unit of blood today.  Follow up as scheduled.  Thank you for choosing [facility] at [facility] to provide your oncology and hematology care.  To afford each patient quality time with our provider, please arrive at least 15 minutes before your scheduled appointment time.    If you have a lab appointment with the [facility] please come in thru the  Main Entrance and check in at the main information desk  You need to re-schedule your appointment should you arrive 10 or more minutes late.  We strive to give you quality time with our providers, and arriving late affects you and other patients whose appointments are after yours.  Also, if you no show three or more times for appointments you may be dismissed from the clinic at the providers discretion.     Again, thank you for choosing [facility].  Our hope is that these requests will decrease the amount of time that you wait before being seen by our physicians.       _____________________________________________________________  Should you have questions after your visit to [facility], please contact our office at [phone] between the hours of 8:30 a.m. and 4:30 p.m.  Voicemails left after 4:30 p.m. will not be returned until the following business day.  For prescription refill requests, have your pharmacy contact our office.       Resources For Cancer Patients and their Caregivers ? American Cancer Society: Can assist with transportation, wigs, general needs, runs Look Good Feel Better.        (618)362-7540 ? Cancer Care: Provides financial assistance, online support groups, medication/co-pay assistance.  1-800-813-HOPE 361-533-4556) ? [place] Assists [place] cancer patients  and their families through emotional , educational and financial support.  (705) 018-8914 ? [place] DSS Where to apply for food stamps, Medicaid and utility assistance. (314)645-4879 ? [facility]: Transportation to medical appointments. 308-394-2582 ? Social Security Administration: May apply for disability if have a Stage IV cancer. 4050360082 (231)208-0001 ? [employer], Disability and Transit Services: Assists with nutrition, care and transit needs. [place] Support Programs: @10RELATIVEDAYS @ > Cancer Support Group  2nd Tuesday of the month 1pm-2pm, Journey Room  > Creative Journey  3rd Tuesday of the month 1130am-1pm, Journey Room  > Look Good Feel Better  1st Wednesday of the month 10am-12 noon, Journey Room (Call [place] to register [phone])

## 2017-10-09 ENCOUNTER — Encounter (HOSPITAL_COMMUNITY): Payer: Self-pay | Admitting: Adult Health

## 2017-10-09 ENCOUNTER — Encounter: Payer: Self-pay | Admitting: Oncology

## 2017-10-09 ENCOUNTER — Encounter (HOSPITAL_BASED_OUTPATIENT_CLINIC_OR_DEPARTMENT_OTHER): Payer: 59

## 2017-10-09 ENCOUNTER — Encounter (HOSPITAL_BASED_OUTPATIENT_CLINIC_OR_DEPARTMENT_OTHER): Payer: 59 | Admitting: Adult Health

## 2017-10-09 ENCOUNTER — Other Ambulatory Visit: Payer: Self-pay

## 2017-10-09 VITALS — BP 134/77 | HR 68 | Temp 98.2°F | Resp 18 | Wt 246.4 lb

## 2017-10-09 DIAGNOSIS — C773 Secondary and unspecified malignant neoplasm of axilla and upper limb lymph nodes: Secondary | ICD-10-CM

## 2017-10-09 DIAGNOSIS — C50411 Malignant neoplasm of upper-outer quadrant of right female breast: Secondary | ICD-10-CM | POA: Diagnosis not present

## 2017-10-09 DIAGNOSIS — R739 Hyperglycemia, unspecified: Secondary | ICD-10-CM

## 2017-10-09 DIAGNOSIS — C50211 Malignant neoplasm of upper-inner quadrant of right female breast: Secondary | ICD-10-CM

## 2017-10-09 DIAGNOSIS — Z5111 Encounter for antineoplastic chemotherapy: Secondary | ICD-10-CM

## 2017-10-09 DIAGNOSIS — C50919 Malignant neoplasm of unspecified site of unspecified female breast: Secondary | ICD-10-CM

## 2017-10-09 DIAGNOSIS — Z5112 Encounter for antineoplastic immunotherapy: Secondary | ICD-10-CM | POA: Diagnosis not present

## 2017-10-09 DIAGNOSIS — Z170 Estrogen receptor positive status [ER+]: Principal | ICD-10-CM

## 2017-10-09 LAB — COMPREHENSIVE METABOLIC PANEL
ALT: 25 U/L (ref 14–54)
AST: 28 U/L (ref 15–41)
Albumin: 4.2 g/dL (ref 3.5–5.0)
Alkaline Phosphatase: 74 U/L (ref 38–126)
Anion gap: 8 (ref 5–15)
BILIRUBIN TOTAL: 0.7 mg/dL (ref 0.3–1.2)
BUN: 25 mg/dL — AB (ref 6–20)
CO2: 27 mmol/L (ref 22–32)
CREATININE: 0.9 mg/dL (ref 0.44–1.00)
Calcium: 10.5 mg/dL — ABNORMAL HIGH (ref 8.9–10.3)
Chloride: 103 mmol/L (ref 101–111)
GFR calc Af Amer: >60 mL/min (ref >60)
Glucose, Bld: 130 mg/dL — ABNORMAL HIGH (ref 65–99)
Potassium: 4.3 mmol/L (ref 3.5–5.1)
Sodium: 138 mmol/L (ref 135–145)
TOTAL PROTEIN: 7.5 g/dL (ref 6.5–8.1)

## 2017-10-09 LAB — CBC WITH DIFFERENTIAL/PLATELET
BASOS ABS: 0 10*3/uL (ref 0.0–0.1)
Basophils Relative: 0 %
EOS ABS: 0.1 10*3/uL (ref 0.0–0.7)
EOS PCT: 1 %
HCT: 32.4 % — ABNORMAL LOW (ref 36.0–46.0)
Hemoglobin: 10.5 g/dL — ABNORMAL LOW (ref 12.0–15.0)
LYMPHS ABS: 1.6 10*3/uL (ref 0.7–4.0)
Lymphocytes Relative: 34 %
MCH: 31.7 pg (ref 26.0–34.0)
MCHC: 32.4 g/dL (ref 30.0–36.0)
MCV: 97.9 fL (ref 78.0–100.0)
Monocytes Absolute: 0.5 10*3/uL (ref 0.1–1.0)
Monocytes Relative: 11 %
Neutro Abs: 2.6 10*3/uL (ref 1.7–7.7)
Neutrophils Relative %: 54 %
PLATELETS: 220 10*3/uL (ref 150–400)
RBC: 3.31 MIL/uL — AB (ref 3.87–5.11)
RDW: 18.1 % — AB (ref 11.5–15.5)
WBC: 4.8 10*3/uL (ref 4.0–10.5)

## 2017-10-09 MED ORDER — DOCETAXEL CHEMO INJECTION 160 MG/16ML
75.0000 mg/m2 | Freq: Once | INTRAVENOUS | Status: AC
  Administered 2017-10-09: 160 mg via INTRAVENOUS
  Filled 2017-10-09: qty 16

## 2017-10-09 MED ORDER — SODIUM CHLORIDE 0.9 % IV SOLN
6.0000 mg/kg | Freq: Once | INTRAVENOUS | Status: AC
  Administered 2017-10-09: 672 mg via INTRAVENOUS
  Filled 2017-10-09: qty 32

## 2017-10-09 MED ORDER — HEPARIN SOD (PORK) LOCK FLUSH 100 UNIT/ML IV SOLN
500.0000 [IU] | Freq: Once | INTRAVENOUS | Status: AC | PRN
  Administered 2017-10-09: 500 [IU]

## 2017-10-09 MED ORDER — SODIUM CHLORIDE 0.9 % IV SOLN
Freq: Once | INTRAVENOUS | Status: AC
  Administered 2017-10-09: 10:00:00 via INTRAVENOUS

## 2017-10-09 MED ORDER — DIPHENHYDRAMINE HCL 25 MG PO CAPS
50.0000 mg | ORAL_CAPSULE | Freq: Once | ORAL | Status: DC
Start: 2017-10-09 — End: 2017-10-09

## 2017-10-09 MED ORDER — DIPHENHYDRAMINE HCL 25 MG PO CAPS
ORAL_CAPSULE | ORAL | Status: AC
  Filled 2017-10-09: qty 2

## 2017-10-09 MED ORDER — PALONOSETRON HCL INJECTION 0.25 MG/5ML
INTRAVENOUS | Status: AC
  Filled 2017-10-09: qty 5

## 2017-10-09 MED ORDER — CARBOPLATIN CHEMO INJECTION 600 MG/60ML
900.0000 mg | Freq: Once | INTRAVENOUS | Status: AC
  Administered 2017-10-09: 900 mg via INTRAVENOUS
  Filled 2017-10-09: qty 90

## 2017-10-09 MED ORDER — DEXAMETHASONE SODIUM PHOSPHATE 10 MG/ML IJ SOLN
10.0000 mg | Freq: Once | INTRAMUSCULAR | Status: AC
  Administered 2017-10-09: 10 mg via INTRAVENOUS

## 2017-10-09 MED ORDER — ACETAMINOPHEN 325 MG PO TABS
650.0000 mg | ORAL_TABLET | Freq: Once | ORAL | Status: AC
  Administered 2017-10-09: 650 mg via ORAL

## 2017-10-09 MED ORDER — DIPHENHYDRAMINE HCL 25 MG PO CAPS
50.0000 mg | ORAL_CAPSULE | Freq: Once | ORAL | Status: AC
  Administered 2017-10-09: 50 mg via ORAL

## 2017-10-09 MED ORDER — DEXAMETHASONE SODIUM PHOSPHATE 10 MG/ML IJ SOLN
INTRAMUSCULAR | Status: AC
  Filled 2017-10-09: qty 1

## 2017-10-09 MED ORDER — ACETAMINOPHEN 325 MG PO TABS: 650.0000 mg | ORAL_TABLET | Freq: Once | ORAL | Status: DC

## 2017-10-09 MED ORDER — PALONOSETRON HCL INJECTION 0.25 MG/5ML
0.2500 mg | Freq: Once | INTRAVENOUS | Status: AC
  Administered 2017-10-09: 0.25 mg via INTRAVENOUS

## 2017-10-09 MED ORDER — SODIUM CHLORIDE 0.9 % IV SOLN
420.0000 mg | Freq: Once | INTRAVENOUS | Status: AC
  Administered 2017-10-09: 420 mg via INTRAVENOUS
  Filled 2017-10-09: qty 14

## 2017-10-09 MED ORDER — PEGFILGRASTIM 6 MG/0.6ML ~~LOC~~ PSKT
6.0000 mg | PREFILLED_SYRINGE | Freq: Once | SUBCUTANEOUS | Status: AC
  Administered 2017-10-09: 6 mg via SUBCUTANEOUS
  Filled 2017-10-09: qty 0.6

## 2017-10-09 MED ORDER — ACETAMINOPHEN 325 MG PO TABS
ORAL_TABLET | ORAL | Status: AC
  Filled 2017-10-09: qty 2

## 2017-10-09 NOTE — Progress Notes (Signed)
Tolerated infusions w/o adverse reaction.  Alert, in no distress.  VSS.  Discharged ambulatory in c/o family.  

## 2017-10-09 NOTE — Progress Notes (Signed)
[place], [place] [address]   CLINIC:  Medical Oncology/Hematology  PCP:  Sharilyn Sites, MD [place] [address] [phone]   REASON FOR VISIT:  Follow-up for Stage IIB invasive ductal carcinoma of (R) breast; ER+/PR+/HER2+  CURRENT THERAPY: Neoadjuvant Taxotere/Carbo/Herceptin/Perjeta every 3 weeks, beginning 05/29/17   BRIEF ONCOLOGIC HISTORY:    Malignant neoplasm of upper-inner quadrant of right breast in female, estrogen receptor positive ([place])   04/13/2017 Initial Diagnosis    Screening detected right breast mass with calcifications 2.1 cm in size and 3.4 cm in size. Calcifications were fibroadenoma. Right breast biopsy upper inner quadrant 1:00 mass: IDC with DCIS with necrosis, grade 3, ER 100%, PR 70%, Ki-67 60%, HER-2 positive ratio 2.34, T2 N1 stage IB (New AJCC)      04/25/2017 Breast MRI    Right breast upper inner quadrant 2.2 x 1.7 x 2.7 cm mass, abnormal area of clumped non-mass enhancement in the right lateral breast 3.2 x 1.9 x 1 cm, too abnormal lymph nodes right axilla 4.3 and 1.7 cm (biopsy-proven breast cancer)       05/08/2017 Procedure    Right axilla lymph node biopsy: Metastatic carcinoma      05/13/2017 Procedure    Right breast biopsy retroareolar region: Intraductal papilloma         INTERVAL HISTORY:  Ms. Maiden 56 y.o. female returns for routine follow-up for right breast cancer.    Due for cycle #6 TCHP chemo today.  Cycle #6 was held last week d/t thrombocytopenia.    She tells me she has been feeling well; "I've had a good week and felt great."  Appetite 100%; energy levels 75%.  She supplements her diet with Glucerna at times.  Denies any N&V or diarrhea.  Denies peripheral neuropathy, headaches, or dizziness.   Denies any chest pain or shortness of breath.  She is looking forward to "ringing the bell" today, as she will finish the chemo portion of her treatment today.  She  understands she will continue Herceptin/Perjeta to total 1 year of therapy.        REVIEW OF SYSTEMS:  Review of Systems  Constitutional: Positive for fatigue.  HENT:  Negative.   Eyes: Negative.   Respiratory: Negative.   Cardiovascular: Negative.   Gastrointestinal: Negative.   Endocrine: Negative.   Genitourinary: Negative.    Musculoskeletal: Negative.   Skin: Negative.   Neurological: Negative.   Hematological: Negative.   Psychiatric/Behavioral: Negative.      PAST MEDICAL/SURGICAL HISTORY:  Past Medical History:  Diagnosis Date  . Asthma   . Blood transfusion    as child  . Diabetes ([place])    type 2   . Heart murmur    d/t aortic stenosis   . History of kidney stones   . Hyperlipidemia   . Hypertension   . Normal echocardiogram   . Sleep apnea    Past Surgical History:  Procedure Laterality Date  . ABDOMINAL HYSTERECTOMY  2004  . CHOLECYSTECTOMY  05/30/2011   Procedure: LAPAROSCOPIC CHOLECYSTECTOMY;  Surgeon: Donato Heinz;  Location: [facility];  Service: General;  Laterality: N/A;  . 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 KIDNEY STONE SURGERY     2017 2-23  . PORTACATH PLACEMENT N/A 05/25/2017   Procedure: INSERTION PORT-A-CATH;  Surgeon: Alphonsa Overall, MD;  Location: [facility];  Service: General;  Laterality: N/A;  SOCIAL HISTORY:  Social History   Socioeconomic History  . Marital status: Married    Spouse name: Not on file  . Number of children: Not on file  . Years of education: Not on file  . Highest education level: Not on file  Social Needs  . Financial resource strain: Not on file  . Food insecurity - worry: Not on file  . Food insecurity - inability: Not on file  . Transportation needs - medical: Not on file  . Transportation needs - non-medical: Not on file  Occupational History  . Not on file  Tobacco Use  . Smoking status: Never Smoker  . Smokeless tobacco: Never Used  Substance and Sexual Activity  .  Alcohol use: Yes    Comment: 2 x year  . Drug use: No  . Sexual activity: Yes    Birth control/protection: Surgical  Other Topics Concern  . Not on file  Social History Narrative  . Not on file    FAMILY HISTORY:  Family History  Problem Relation Age of Onset  . Lung cancer Maternal Grandmother   . Anesthesia problems Neg Hx   . Hypotension Neg Hx   . Malignant hyperthermia Neg Hx   . Pseudochol deficiency Neg Hx     CURRENT MEDICATIONS:  Outpatient Encounter Medications as of 10/09/2017  Medication Sig Note  . acetaminophen (TYLENOL) 325 MG tablet Take 650 mg by mouth every 6 (six) hours as needed.   Marland Kitchen albuterol (PROVENTIL HFA;VENTOLIN HFA) 108 (90 BASE) MCG/ACT inhaler Inhale 1 puff into the lungs every 6 (six) hours as needed for wheezing or shortness of breath.    . Black Cohosh 40 MG CAPS Take 40 mg by mouth at bedtime.    Marland Kitchen dexamethasone (DECADRON) 4 MG tablet Take 1 tablet (4 mg total) by mouth daily. 1 tab day before chemo and 1 tab day after 05/20/2017: Will start when starts chemtherapy   . diphenhydrAMINE (BENADRYL) 25 mg capsule Take 25 mg by mouth at bedtime as needed for allergies. For allergies    . diphenoxylate-atropine (LOMOTIL) 2.5-0.025 MG tablet TAKE (1) TABLET BY MOUTH (4) TIMES DAILY AS NEEDED FOR DIARRHEA OR LOOSE STOOLS.   Marland Kitchen escitalopram (LEXAPRO) 20 MG tablet Take 20 mg by mouth at bedtime.    . fenofibrate 160 MG tablet Take 160 mg by mouth at bedtime.    Marland Kitchen HYDROcodone-acetaminophen (NORCO/VICODIN) 5-325 MG tablet Take 1 tablet by mouth every 6 (six) hours as needed for moderate pain.   Marland Kitchen lidocaine-prilocaine (EMLA) cream Apply to affected area once 05/20/2017: Will start when starts chemtherapy   . losartan (COZAAR) 50 MG tablet Take 25 mg by mouth at bedtime.    . magic mouthwash w/lidocaine SOLN Take 5 mLs by mouth 4 (four) times daily as needed for mouth pain.   . metFORMIN (GLUCOPHAGE) 500 MG tablet Take 500 mg by mouth 2 (two) times daily with a meal.    . Multiple Vitamin (MULTIVITAMIN) tablet Take 1 tablet by mouth daily.   Marland Kitchen omeprazole (PRILOSEC) 40 MG capsule TAKE ONE CAPSULE BY MOUTH ONCE DAILY.   Marland Kitchen ondansetron (ZOFRAN) 8 MG tablet TAKE (1) TABLET TWICE A DAY AS NEEDED FOR NAUSEA/VOMITING. START ON DAY 3 AFTER CHEMO.   . prochlorperazine (COMPAZINE) 10 MG tablet TAKE (1) TABLET BY MOUTH EVERY SIX HOURS AS NEEDED FOR NAUSEA AND VOMITING.    No facility-administered encounter medications on file as of 10/09/2017.     ALLERGIES:  Allergies  Allergen Reactions  .  Latex Itching  . Pyridostigmine Bromide Other (See Comments)    Makes muscles twitch  . Statins Nausea Only     PHYSICAL EXAM:  ECOG Performance status: 1-2 - Symptomatic; requires occasional assistance.   BP 128/61 HR 73 Resp 18 Temp 97.9 O2 sat 98%  Weight: 246.6 lbs    Physical Exam  Constitutional: She is oriented to person, place, and time and well-developed, well-nourished, and in no distress.  Seen in chemo bed in infusion area   HENT:  Head: Normocephalic.  Mouth/Throat: Oropharynx is clear and moist.  Eyes: Conjunctivae are normal. No scleral icterus.  Neck: Normal range of motion. Neck supple.  Cardiovascular: Normal rate and regular rhythm.  Pulmonary/Chest: Effort normal and breath sounds normal. No respiratory distress.  Abdominal: Soft. Bowel sounds are normal. There is no tenderness.  Musculoskeletal: Normal range of motion. She exhibits no edema.  Lymphadenopathy:    She has no cervical adenopathy.       Right: No supraclavicular adenopathy present.       Left: No supraclavicular adenopathy present.  Neurological: She is alert and oriented to person, place, and time. No cranial nerve deficit.  Skin: Skin is warm and dry. No rash noted.  Psychiatric: Mood, memory, affect and judgment normal.  Nursing note and vitals reviewed.    LABORATORY DATA:  I have reviewed the labs as listed.  CBC    Component Value Date/Time   WBC 4.8  10/09/2017 0900   RBC 3.31 (L) 10/09/2017 0900   HGB 10.5 (L) 10/09/2017 0900   HGB 13.8 04/22/2017 0831   HCT 32.4 (L) 10/09/2017 0900   HCT 41.9 04/22/2017 0831   PLT 220 10/09/2017 0900   PLT 184 04/22/2017 0831   MCV 97.9 10/09/2017 0900   MCV 85.7 04/22/2017 0831   MCH 31.7 10/09/2017 0900   MCHC 32.4 10/09/2017 0900   RDW 18.1 (H) 10/09/2017 0900   RDW 13.9 04/22/2017 0831   LYMPHSABS 1.6 10/09/2017 0900   LYMPHSABS 2.1 04/22/2017 0831   MONOABS 0.5 10/09/2017 0900   MONOABS 0.5 04/22/2017 0831   EOSABS 0.1 10/09/2017 0900   EOSABS 0.3 04/22/2017 0831   BASOSABS 0.0 10/09/2017 0900   BASOSABS 0.0 04/22/2017 0831   CMP Latest Ref Rng & Units 10/09/2017 10/02/2017 09/11/2017  Glucose 65 - 99 mg/dL 130(H) 115(H) 169(H)  BUN 6 - 20 mg/dL 25(H) 25(H) 25(H)  Creatinine 0.44 - 1.00 mg/dL 0.90 0.93 0.89  Sodium 135 - 145 mmol/L 138 138 138  Potassium 3.5 - 5.1 mmol/L 4.3 4.0 4.0  Chloride 101 - 111 mmol/L 103 105 105  CO2 22 - 32 mmol/L _0 Calcium 8.9 - 10.3 mg/dL 10.5(H) 10.5(H) 10.3  Total Protein 6.5 - 8.1 g/dL 7.5 7.1 7.2  Total Bilirubin 0.3 - 1.2 mg/dL 0.7 0.9 0.6  Alkaline Phos 38 - 126 U/L 74 72 66  AST 15 - 41 U/L _1 ALT 14 - 54 U/L _2 PENDING LABS:    DIAGNOSTIC IMAGING:  *The following radiologic images and reports have been reviewed independently and agree with below findings.  Mammogram: 04/07/17 CLINICAL DATA:  Screening recall for right breast mass and calcifications. In addition, the patient states she now feels a palpable abnormality in her upper right breast since her recent screening mammogram.  EXAM: 2D DIGITAL DIAGNOSTIC UNILATERAL RIGHT MAMMOGRAM WITH CAD AND ADJUNCT TOMO  RIGHT BREAST ULTRASOUND  COMPARISON:  Screening mammogram dated 03/23/2017.  ACR Breast  Density Category b: There are scattered areas of fibroglandular density.  FINDINGS: Cc and MLO tomograms of the right breast as well as spot  compression magnification views of the right breast were performed. There is an irregular spiculated mass with associated microcalcifications in the superior right breast measuring approximately 2.1 cm. Spot compression magnification views of the retroareolar right breast demonstrate loosely grouped predominantly coarse calcifications, however some of which are amorphous and suspicious, spanning a distance of approximately 3.4 cm.  Mammographic images were processed with CAD.  Physical examination of the upper right breast reveals a firm fixed nodule at the approximate 12 o'clock position.  Targeted ultrasound of the right breast was performed demonstrating an irregular hypoechoic mass at 12 o'clock 9 cm from nipple measuring 2 x 1.3 x 2.1 cm. This corresponds well with the mass seen at mammography. No definite lymphadenopathy seen in the right axilla.  IMPRESSION: 1. Highly suspicious right breast mass with associated calcifications measuring 2.1 cm.  2. Additional indeterminate calcifications in the retroareolar right breast.  RECOMMENDATION: 1. Ultrasound-guided biopsy of the mass in the right breast at the 12 o'clock position is recommended.  3. Stereotactic guided biopsy of the calcifications in the retroareolar right breast is recommended.  Biopsies are being scheduled for the patient at the [facility].  I have discussed the findings and recommendations with the patient. Results were also provided in writing at the conclusion of the visit. If applicable, a reminder letter will be sent to the patient regarding the next appointment.  BI-RADS CATEGORY  5: Highly suggestive of malignancy.   Electronically Signed   By: Everlean Alstrom M.D.   On: 04/07/2017 13:06   Last ECHO: 08/10/17  *[facility]*                         [place], [place]  [address]                            [phone]  ------------------------------------------------------------------- Transthoracic Echocardiography  Patient:    Idaliz, Tinkle MR #:       [id number] Study Date: 08/10/2017 Gender:     F Age:        56 Height:     154.9 cm Weight:     112.5 kg BSA:        2.27 m^2 Pt. Status: Room:   ATTENDING    Default, Provider [address] [place]  Elenore Paddy, M.D.  REFERRING    Loralie Champagne, M.D.  PERFORMING   [facility], Outpatient  SONOGRAPHER  Chelsea Androw  cc:  ------------------------------------------------------------------- LV EF: 65% -   70%  ------------------------------------------------------------------- Indications:      (Chemo I74.9).  ------------------------------------------------------------------- History:   PMH:  Breast cancer  Risk factors:  Murmur Hypertension. Diabetes mellitus. Dyslipidemia.  ------------------------------------------------------------------- Study Conclusions  - Left ventricle: The cavity size was normal. Wall thickness was   increased in a pattern of mild LVH. Systolic function was   vigorous. The estimated ejection fraction was in the range of 65%   to 70%. Wall motion was normal;  there were no regional wall   motion abnormalities. Doppler parameters are consistent with   abnormal left ventricular relaxation (grade 1 diastolic   dysfunction). GLS -19.6%. - Aortic valve: Trileaflet; moderately calcified leaflets. There   was moderate stenosis. There was trivial regurgitation. Mean   gradient (S): 26 mm Hg. Peak gradient (S): 52 mm Hg. Valve area   (VTI): 1.3 cm^2. - Mitral valve: Moderately calcified annulus. - Right ventricle: The cavity size was normal. Systolic function   was normal. - Pulmonary arteries: No complete TR doppler jet unable to estimate   PA systolic pressure. - Inferior vena cava: The vessel was normal in size. The    respirophasic diameter changes were in the normal range (>= 50%),   consistent with normal central venous pressure.  Impressions:  - Normal LV size with mild LV hypertrophy. EF 65-70%. Strain as   above. Normal RV size and systolic function. Moderate aortic   stenosis.       PATHOLOGY:  (R) breast biopsy: 04/13/17     (R) axillary lymph node biopsy: 05/08/17         ASSESSMENT & PLAN:   Stage IIB invasive ductal carcinoma of (R) breast; ER+/PR+/HER2+:  -Recalled from screening mammogram in 03/2017 for (R) breast abnormality. Diagnostic mammogram/ultrasound of (R) breast suspicious for malignancy. Underwent (R) breast biopsy on 04/13/17 revealing invasive ductal carcinoma, grade 3, ER+, PR+, HER2+. Seen initially in consultation with Dr. Lindi Adie at [facility], and patient elected to have her treatment closer to home and presented to [facility] for cycle #1 neoadjuvant chemo with [facility] on 05/29/17. Plan is to complete 6 cycles of neoadjuvant TCHP before breast surgery. She wants Dr. Lucia Gaskins in [place] to do her surgery; we will help facilitate that in the future.   -Due for cycle #6 TCHP today. Labs reviewed and are adequate for tretment today.   -Continue Herceptin/Perjeta every 3 weeks.  -Last ECHO showed EF 65-70% on 08/10/17; she will be due for next cardiac evaluation with ECHO in late 10/2017; orders placed today.  -Referral back to Dr. Lucia Gaskins to discuss breast surgery options. Depending on breast surgery, she may require adjuvant radiation therapy. She will also need anti-estrogen therapy from 5-10 years.  Will plan to continue Herceptin/Perjeta every 3 weeks to total 1 year of therapy.    -Return to cancer center in ~6 weeks to discuss adjuvant treatment plan. .      Dispo:  -ECHO in 10/2017; orders placed today.  -Refer back to Dr. Lucia Gaskins with [facility] for breast surgery.  -Continue Herceptin/Perjeta every 3 weeks; may need to adjust treatment schedule some depending on  surgery date.  -Return to cancer center in ~6 weeks to see MD to review surgical path and discuss adjuvant treatment plan.  (may need to move this appointment based on when her breast surgery is scheduled).      All questions were answered to patient's stated satisfaction. Encouraged patient to call with any new concerns or questions before her next visit to the cancer center and we can certain see her sooner, if needed.    Plan of care discussed with Dr. Talbert Cage, who agrees with the above aforementioned.    Orders placed this encounter:  Orders Placed This Encounter  Procedures  . ECHOCARDIOGRAM COMPLETE      Mike Craze, NP [place] [phone]

## 2017-10-09 NOTE — Progress Notes (Signed)
Faxed referral/pt records to [facility] / Dr. Lucia Gaskins [phone].

## 2017-10-09 NOTE — Treatment Plan (Signed)
Per Mike Craze, ok to tie treatment plans to patient currrent correct weight of 111.8 kg. Pt has lost 10 kilos since treatment initiation.

## 2017-10-10 ENCOUNTER — Encounter (HOSPITAL_COMMUNITY): Payer: Self-pay | Admitting: Adult Health

## 2017-10-26 ENCOUNTER — Other Ambulatory Visit (HOSPITAL_COMMUNITY): Payer: 59

## 2017-10-30 ENCOUNTER — Encounter (HOSPITAL_COMMUNITY): Payer: Self-pay

## 2017-10-30 ENCOUNTER — Inpatient Hospital Stay (HOSPITAL_COMMUNITY): Payer: 59 | Attending: Internal Medicine

## 2017-10-30 ENCOUNTER — Other Ambulatory Visit: Payer: Self-pay

## 2017-10-30 ENCOUNTER — Inpatient Hospital Stay (HOSPITAL_BASED_OUTPATIENT_CLINIC_OR_DEPARTMENT_OTHER): Payer: 59 | Admitting: Adult Health

## 2017-10-30 VITALS — BP 114/53 | HR 57 | Temp 98.6°F | Resp 16 | Wt 248.8 lb

## 2017-10-30 DIAGNOSIS — Z5112 Encounter for antineoplastic immunotherapy: Secondary | ICD-10-CM | POA: Diagnosis not present

## 2017-10-30 DIAGNOSIS — R0602 Shortness of breath: Secondary | ICD-10-CM | POA: Diagnosis not present

## 2017-10-30 DIAGNOSIS — R011 Cardiac murmur, unspecified: Secondary | ICD-10-CM

## 2017-10-30 DIAGNOSIS — D6481 Anemia due to antineoplastic chemotherapy: Secondary | ICD-10-CM | POA: Insufficient documentation

## 2017-10-30 DIAGNOSIS — R0789 Other chest pain: Secondary | ICD-10-CM

## 2017-10-30 DIAGNOSIS — J45909 Unspecified asthma, uncomplicated: Secondary | ICD-10-CM

## 2017-10-30 DIAGNOSIS — G4730 Sleep apnea, unspecified: Secondary | ICD-10-CM

## 2017-10-30 DIAGNOSIS — C50211 Malignant neoplasm of upper-inner quadrant of right female breast: Secondary | ICD-10-CM | POA: Diagnosis not present

## 2017-10-30 DIAGNOSIS — Z170 Estrogen receptor positive status [ER+]: Secondary | ICD-10-CM | POA: Insufficient documentation

## 2017-10-30 DIAGNOSIS — Z801 Family history of malignant neoplasm of trachea, bronchus and lung: Secondary | ICD-10-CM

## 2017-10-30 DIAGNOSIS — Z79899 Other long term (current) drug therapy: Secondary | ICD-10-CM | POA: Insufficient documentation

## 2017-10-30 DIAGNOSIS — E119 Type 2 diabetes mellitus without complications: Secondary | ICD-10-CM | POA: Insufficient documentation

## 2017-10-30 DIAGNOSIS — Z7984 Long term (current) use of oral hypoglycemic drugs: Secondary | ICD-10-CM | POA: Diagnosis not present

## 2017-10-30 DIAGNOSIS — Z87442 Personal history of urinary calculi: Secondary | ICD-10-CM

## 2017-10-30 DIAGNOSIS — C50919 Malignant neoplasm of unspecified site of unspecified female breast: Secondary | ICD-10-CM

## 2017-10-30 DIAGNOSIS — D696 Thrombocytopenia, unspecified: Secondary | ICD-10-CM | POA: Insufficient documentation

## 2017-10-30 DIAGNOSIS — E785 Hyperlipidemia, unspecified: Secondary | ICD-10-CM | POA: Insufficient documentation

## 2017-10-30 DIAGNOSIS — C773 Secondary and unspecified malignant neoplasm of axilla and upper limb lymph nodes: Secondary | ICD-10-CM | POA: Insufficient documentation

## 2017-10-30 DIAGNOSIS — D649 Anemia, unspecified: Secondary | ICD-10-CM

## 2017-10-30 DIAGNOSIS — I10 Essential (primary) hypertension: Secondary | ICD-10-CM

## 2017-10-30 DIAGNOSIS — R197 Diarrhea, unspecified: Secondary | ICD-10-CM | POA: Diagnosis not present

## 2017-10-30 DIAGNOSIS — R739 Hyperglycemia, unspecified: Secondary | ICD-10-CM

## 2017-10-30 LAB — CBC WITH DIFFERENTIAL/PLATELET
BASOS ABS: 0 10*3/uL (ref 0.0–0.1)
Basophils Relative: 0 %
Eosinophils Absolute: 0 10*3/uL (ref 0.0–0.7)
Eosinophils Relative: 0 %
HEMATOCRIT: 23.6 % — AB (ref 36.0–46.0)
HEMOGLOBIN: 7.5 g/dL — AB (ref 12.0–15.0)
Lymphocytes Relative: 29 %
Lymphs Abs: 1 10*3/uL (ref 0.7–4.0)
MCH: 31.9 pg (ref 26.0–34.0)
MCHC: 31.8 g/dL (ref 30.0–36.0)
MCV: 100.4 fL — ABNORMAL HIGH (ref 78.0–100.0)
MONOS PCT: 15 %
Monocytes Absolute: 0.5 10*3/uL (ref 0.1–1.0)
NEUTROS ABS: 2 10*3/uL (ref 1.7–7.7)
NEUTROS PCT: 56 %
Platelets: 59 10*3/uL — ABNORMAL LOW (ref 150–400)
RBC: 2.35 MIL/uL — AB (ref 3.87–5.11)
RDW: 20.3 % — ABNORMAL HIGH (ref 11.5–15.5)
WBC: 3.6 10*3/uL — AB (ref 4.0–10.5)

## 2017-10-30 LAB — COMPREHENSIVE METABOLIC PANEL
ALK PHOS: 65 U/L (ref 38–126)
ALT: 21 U/L (ref 14–54)
ANION GAP: 10 (ref 5–15)
AST: 23 U/L (ref 15–41)
Albumin: 3.8 g/dL (ref 3.5–5.0)
BUN: 11 mg/dL (ref 6–20)
CO2: 25 mmol/L (ref 22–32)
CREATININE: 0.9 mg/dL (ref 0.44–1.00)
Calcium: 10 mg/dL (ref 8.9–10.3)
Chloride: 106 mmol/L (ref 101–111)
GFR calc Af Amer: >60 mL/min (ref >60)
GFR calc non Af Amer: >60 mL/min (ref >60)
GLUCOSE: 110 mg/dL — AB (ref 65–99)
Potassium: 3.9 mmol/L (ref 3.5–5.1)
Sodium: 141 mmol/L (ref 135–145)
Total Bilirubin: 0.7 mg/dL (ref 0.3–1.2)
Total Protein: 6.9 g/dL (ref 6.5–8.1)

## 2017-10-30 LAB — PREPARE RBC (CROSSMATCH)

## 2017-10-30 MED ORDER — HEPARIN SOD (PORK) LOCK FLUSH 100 UNIT/ML IV SOLN
500.0000 [IU] | Freq: Every day | INTRAVENOUS | Status: AC | PRN
  Administered 2017-10-30: 500 [IU]
  Filled 2017-10-30: qty 5

## 2017-10-30 MED ORDER — SODIUM CHLORIDE 0.9% FLUSH
10.0000 mL | INTRAVENOUS | Status: AC | PRN
  Administered 2017-10-30: 10 mL

## 2017-10-30 MED ORDER — ACETAMINOPHEN 325 MG PO TABS
650.0000 mg | ORAL_TABLET | Freq: Once | ORAL | Status: AC
  Administered 2017-10-30: 650 mg via ORAL
  Filled 2017-10-30: qty 2

## 2017-10-30 MED ORDER — SODIUM CHLORIDE 0.9 % IV SOLN
250.0000 mL | Freq: Once | INTRAVENOUS | Status: AC
  Administered 2017-10-30: 250 mL via INTRAVENOUS

## 2017-10-30 MED ORDER — DIPHENHYDRAMINE HCL 25 MG PO CAPS
25.0000 mg | ORAL_CAPSULE | Freq: Once | ORAL | Status: AC
  Administered 2017-10-30: 25 mg via ORAL
  Filled 2017-10-30: qty 1

## 2017-10-30 MED ORDER — HEPARIN SOD (PORK) LOCK FLUSH 100 UNIT/ML IV SOLN
INTRAVENOUS | Status: AC
  Filled 2017-10-30: qty 5

## 2017-10-30 NOTE — Patient Instructions (Signed)
[facility] at [facility] Discharge Instructions  RECOMMENDATIONS MADE BY THE CONSULTANT AND ANY TEST RESULTS WILL BE SENT TO YOUR REFERRING PHYSICIAN.  2 units of blood given today. Follow up as scheduled.  Thank you for choosing [facility] at [facility] to provide your oncology and hematology care.  To afford each patient quality time with our provider, please arrive at least 15 minutes before your scheduled appointment time.    If you have a lab appointment with the Cancer Center please come in thru the  Main Entrance and check in at the main information desk  You need to re-schedule your appointment should you arrive 10 or more minutes late.  We strive to give you quality time with our providers, and arriving late affects you and other patients whose appointments are after yours.  Also, if you no show three or more times for appointments you may be dismissed from the clinic at the providers discretion.     Again, thank you for choosing [facility].  Our hope is that these requests will decrease the amount of time that you wait before being seen by our physicians.       _____________________________________________________________  Should you have questions after your visit to [facility], please contact our office at [phone] between the hours of 8:30 a.m. and 4:30 p.m.  Voicemails left after 4:30 p.m. will not be returned until the following business day.  For prescription refill requests, have your pharmacy contact our office.       Resources For Cancer Patients and their Caregivers ? American Cancer Society: Can assist with transportation, wigs, general needs, runs Look Good Feel Better.        1-888-227-6333 ? Cancer Care: Provides financial assistance, online support groups, medication/co-pay assistance.  1-800-813-HOPE (4673) ? [facility] Assists [place] cancer  patients and their families through emotional , educational and financial support.  336-427-4357 ? [place] DSS Where to apply for food stamps, Medicaid and utility assistance. 336-342-1394 ? [facility]: Transportation to medical appointments. 336-347-2287 ? Social Security Administration: May apply for disability if have a Stage IV cancer. 336-342-7796 1-800-772-1213 ? [place] Aging, Disability and Transit Services: Assists with nutrition, care and transit needs. 336-349-2343  Cancer Center Support Programs: @10RELATIVEDAYS@ > Cancer Support Group  2nd Tuesday of the month 1pm-2pm, Journey Room  > Creative Journey  3rd Tuesday of the month 1130am-1pm, Journey Room  > Look Good Feel Better  1st Wednesday of the month 10am-12 noon, Journey Room (Call American Cancer Society to register 1-800-395-5775)   

## 2017-10-30 NOTE — Progress Notes (Deleted)
[place], [place] [address]   CLINIC:  Medical Oncology/Hematology  REASON FOR VISIT:  Routine follow-up during active treatment for ***  CURRENT THERAPY: ***  BRIEF ONCOLOGIC HISTORY/HPI:  ***  INTERVAL HISTORY:  Ms. 57 y.o. returns for consideration of next cycle of therapy for ***.   ***   Therapy  Dates ***            REVIEW OF SYSTEMS:  Review of Systems - Oncology   PAST MEDICAL/SURGICAL HISTORY:  Past Medical History:  Diagnosis Date  . Asthma   . Blood transfusion    as child  . Diabetes ([place])    type 2   . Heart murmur    d/t aortic stenosis   . History of kidney stones   . Hyperlipidemia   . Hypertension   . Normal echocardiogram   . Sleep apnea    Past Surgical History:  Procedure Laterality Date  . ABDOMINAL HYSTERECTOMY  2004  . CHOLECYSTECTOMY  05/30/2011   Procedure: LAPAROSCOPIC CHOLECYSTECTOMY;  Surgeon: Donato Heinz;  Location: [facility];  Service: General;  Laterality: N/A;  . 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 KIDNEY STONE SURGERY     2017 2-23  . PORTACATH PLACEMENT N/A 05/25/2017   Procedure: INSERTION PORT-A-CATH;  Surgeon: Alphonsa Overall, MD;  Location: [facility];  Service: General;  Laterality: N/A;     SOCIAL HISTORY:  Social History   Socioeconomic History  . Marital status: Married    Spouse name: Not on file  . Number of children: Not on file  . Years of education: Not on file  . Highest education level: Not on file  Social Needs  . Financial resource strain: Not on file  . Food insecurity - worry: Not on file  . Food insecurity - inability: Not on file  . Transportation needs - medical: Not on file  . Transportation needs - non-medical: Not on file  Occupational History  . Not on file  Tobacco Use  . Smoking status: Never Smoker  . Smokeless tobacco: Never Used  Substance and Sexual Activity  . Alcohol use: Yes    Comment: 2 x year  . Drug use:  No  . Sexual activity: Yes    Birth control/protection: Surgical  Other Topics Concern  . Not on file  Social History Narrative  . Not on file    FAMILY HISTORY:  Family History  Problem Relation Age of Onset  . Lung cancer Maternal Grandmother   . Anesthesia problems Neg Hx   . Hypotension Neg Hx   . Malignant hyperthermia Neg Hx   . Pseudochol deficiency Neg Hx     CURRENT MEDICATIONS:  Outpatient Encounter Medications as of 10/30/2017  Medication Sig Note  . acetaminophen (TYLENOL) 325 MG tablet Take 650 mg by mouth every 6 (six) hours as needed.   Marland Kitchen albuterol (PROVENTIL HFA;VENTOLIN HFA) 108 (90 BASE) MCG/ACT inhaler Inhale 1 puff into the lungs every 6 (six) hours as needed for wheezing or shortness of breath.    . Black Cohosh 40 MG CAPS Take 40 mg by mouth at bedtime.    . diphenhydrAMINE (BENADRYL) 25 mg capsule Take 25 mg by mouth at bedtime as needed for allergies. For allergies    . diphenoxylate-atropine (LOMOTIL) 2.5-0.025 MG tablet TAKE (1) TABLET BY MOUTH (4) TIMES DAILY AS NEEDED FOR DIARRHEA OR LOOSE STOOLS.   Marland Kitchen escitalopram (  LEXAPRO) 20 MG tablet Take 20 mg by mouth at bedtime.    . fenofibrate 160 MG tablet Take 160 mg by mouth at bedtime.    Marland Kitchen HYDROcodone-acetaminophen (NORCO/VICODIN) 5-325 MG tablet Take 1 tablet by mouth every 6 (six) hours as needed for moderate pain.   Marland Kitchen losartan (COZAAR) 50 MG tablet Take 25 mg by mouth at bedtime.    . magic mouthwash w/lidocaine SOLN Take 5 mLs by mouth 4 (four) times daily as needed for mouth pain.   . metFORMIN (GLUCOPHAGE) 500 MG tablet Take 500 mg by mouth 2 (two) times daily with a meal.   . Multiple Vitamin (MULTIVITAMIN) tablet Take 1 tablet by mouth daily.   Marland Kitchen omeprazole (PRILOSEC) 40 MG capsule TAKE ONE CAPSULE BY MOUTH ONCE DAILY.   Marland Kitchen prochlorperazine (COMPAZINE) 10 MG tablet TAKE (1) TABLET BY MOUTH EVERY SIX HOURS AS NEEDED FOR NAUSEA AND VOMITING.   . [DISCONTINUED] dexamethasone (DECADRON) 4 MG tablet Take 1  tablet (4 mg total) by mouth daily. 1 tab day before chemo and 1 tab day after 05/20/2017: Will start when starts chemtherapy   . [DISCONTINUED] lidocaine-prilocaine (EMLA) cream Apply to affected area once 05/20/2017: Will start when starts chemtherapy   . [DISCONTINUED] ondansetron (ZOFRAN) 8 MG tablet TAKE (1) TABLET TWICE A DAY AS NEEDED FOR NAUSEA/VOMITING. START ON DAY 3 AFTER CHEMO.    No facility-administered encounter medications on file as of 10/30/2017.     ALLERGIES:  Allergies  Allergen Reactions  . Latex Itching  . Pyridostigmine Bromide Other (See Comments)    Makes muscles twitch  . Statins Nausea Only     PHYSICAL EXAM:  ECOG Performance status: ***  There were no vitals filed for this visit. There were no vitals filed for this visit.  Physical Exam   LABORATORY DATA:  I have reviewed the labs as listed.  CBC    Component Value Date/Time   WBC 3.6 (L) 10/30/2017 0941   RBC 2.35 (L) 10/30/2017 0941   HGB 7.5 (L) 10/30/2017 0941   HGB 13.8 04/22/2017 0831   HCT 23.6 (L) 10/30/2017 0941   HCT 41.9 04/22/2017 0831   PLT PENDING 10/30/2017 0941   PLT 184 04/22/2017 0831   MCV 100.4 (H) 10/30/2017 0941   MCV 85.7 04/22/2017 0831   MCH 31.9 10/30/2017 0941   MCHC 31.8 10/30/2017 0941   RDW 20.3 (H) 10/30/2017 0941   RDW 13.9 04/22/2017 0831   LYMPHSABS 1.0 10/30/2017 0941   LYMPHSABS 2.1 04/22/2017 0831   MONOABS 0.5 10/30/2017 0941   MONOABS 0.5 04/22/2017 0831   EOSABS 0.0 10/30/2017 0941   EOSABS 0.3 04/22/2017 0831   BASOSABS 0.0 10/30/2017 0941   BASOSABS 0.0 04/22/2017 0831   CMP Latest Ref Rng & Units 10/09/2017 10/02/2017 09/11/2017  Glucose 65 - 99 mg/dL 130(H) 115(H) 169(H)  BUN 6 - 20 mg/dL 25(H) 25(H) 25(H)  Creatinine 0.44 - 1.00 mg/dL 0.90 0.93 0.89  Sodium 135 - 145 mmol/L 138 138 138  Potassium 3.5 - 5.1 mmol/L 4.3 4.0 4.0  Chloride 101 - 111 mmol/L 103 105 105  CO2 22 - 32 mmol/L _0 Calcium 8.9 - 10.3 mg/dL 10.5(H) 10.5(H)  10.3  Total Protein 6.5 - 8.1 g/dL 7.5 7.1 7.2  Total Bilirubin 0.3 - 1.2 mg/dL 0.7 0.9 0.6  Alkaline Phos 38 - 126 U/L 74 72 66  AST 15 - 41 U/L _1 ALT 14 - 54 U/L 25 24 26  DIAGNOSTIC IMAGING:  Last restaging scans Brief summary of findings Next restaging scans due              ASSESSMENT & PLAN:    *** - -Her visit with her surgeon was originally scheduled for today, but she had to reschedule because her husband was injured and it is very important to her that he be present for this visit with Dr. Lucia Gaskins.  I will reach out to Dr. Lucia Gaskins via [facility] message in Epic to see if he would like any additional breast imaging while she is waiting to see him for a visit on 12/02/17.  Her mid-treatment breast MRI showed partial response to both the primary breast mass and axillary adenopathy. If Dr. Lucia Gaskins would like additional imaging before surgery, we are happy to help coordinate this for him. Will await his response to [employer].     Encounter for antineoplastic therapy:  -Current clinical concerns d/t treatment include:   *Anemia: Hgb 7.5 g/dL today. She is symptomatic with shortness of breath and DOE.  Will transfuse 2 units PRBCs today.  Anemia is likely secondary to chemotherapy. No reported bleeding episodes per patient.   *Chest tightness/progressive shortness of breath: Likely secondary to anemia. However, given her murmur and potential for cardiotoxicity with Herceptin/Perjeta, will hold treatment today.  Her ECHO is rescheduled for Monday, 11/02/17. Will bring her back next week after ECHO to ensure that her heart function has not declined and is suitable to continue anti-HER2 therapy.   *Thrombocytopenia: Plts 59,000 today. She does have new bruising to her right upper abdomen and right leg, without any known h/o trauma. No pain or underlying hematoma appreciated on physical exam. Likely d/t chemotherapy/bone marrow suppression.  Encouraged her to exercise caution  given low plt count.       Dispo:  -Hold Herceptin/Perjeta today.  -Transfuse 2 units PRBCs today.  -ECHO rescheduled for Monday, 11/02/17.  -Return to cancer center next week for re-consideration for Herceptin/Perjeta after ECHO.     All questions were answered to patient's stated satisfaction. Encouraged patient to call with any new concerns or questions before her next visit to the cancer center and we can certain see her sooner, if needed.     Plan of care discussed with Dr. Sherrine Maples, who agrees with the above aforementioned.    Orders placed this encounter:  No orders of the defined types were placed in this encounter.     Mike Craze, NP [place] [phone]

## 2017-10-30 NOTE — Progress Notes (Signed)
Renee Harris, Renee Harris [id number]   CLINIC:  Medical Oncology/Hematology  PCP:  Sharilyn Sites, [place] [address] [phone]   REASON FOR VISIT:  Follow-up for Stage IIB invasive ductal carcinoma of (R) breast; ER+/PR+/HER2+  CURRENT THERAPY: Herceptin/Perjeta every 3 weeks (pending breast surgery with Dr. Lucia Gaskins).    BRIEF ONCOLOGIC HISTORY:    Malignant neoplasm of upper-inner quadrant of right breast in female, estrogen receptor positive ([place])   04/13/2017 Initial Diagnosis    Screening detected right breast mass with calcifications 2.1 cm in size and 3.4 cm in size. Calcifications were fibroadenoma. Right breast biopsy upper inner quadrant 1:00 mass: IDC with DCIS with necrosis, grade 3, ER 100%, PR 70%, Ki-67 60%, HER-2 positive ratio 2.34, T2 N1 stage IB (New AJCC)      04/25/2017 Breast MRI    Right breast upper inner quadrant 2.2 x 1.7 x 2.7 cm mass, abnormal area of clumped non-mass enhancement in the right lateral breast 3.2 x 1.9 x 1 cm, too abnormal lymph nodes right axilla 4.3 and 1.7 cm (biopsy-proven breast cancer)       05/08/2017 Procedure    Right axilla lymph node biopsy: Metastatic carcinoma      05/13/2017 Procedure    Right breast biopsy retroareolar region: Intraductal papilloma         INTERVAL HISTORY:  Renee Harris 57 y.o. female returns for routine follow-up for right breast cancer.    Here today for Herceptin/Perjeta alone.  She is s/p cycle #6 TCHP on 10/09/17.  Since her last cycle, she tells me that she has not been feeling well.  "It just seems like the side effects of the last round of chemo have lasted longer."  She reports shortness of breath, particularly with exertion.  "My chest feels tight sometimes. It feels like when I used to have asthma really bad."  Denies any chest pain or orthopnea.  She tells me, "I just have not felt good this week."  She has some new bruising to her  right upper abdomen, and her right leg, that she wants me to take a look at today.  Denies any active bleeding episodes including blood in her stools, hematuria, vaginal bleeding, or gingival bleeding.  She has occasional mild nosebleeds, which have been chronic and are largely unchanged.  She has had a few episodes of diarrhea since her last chemotherapy; OTC Imodium helps.  She was scheduled to see her surgeon, Dr. Lucia Gaskins today.  However, she tells me that her son husband recently injured a back injury, and she decided to reschedule this appointment.  "I have to have my husband there with me for that visit."  She tells me her visit with Dr. Lucia Gaskins has been rescheduled to 12/02/17.  Her routine echocardiogram was recently due, but could not be completed due to recent winter weather.  It has been rescheduled to Monday, 11/02/17.  Overall, she does not feel well today.    REVIEW OF SYSTEMS:  Review of Systems  Constitutional: Positive for fatigue. Negative for chills and fever.  HENT:  Negative.   Eyes: Negative.   Respiratory: Positive for chest tightness and shortness of breath.   Cardiovascular: Negative for chest pain.  Gastrointestinal: Positive for diarrhea. Negative for nausea and vomiting.  Endocrine: Negative.   Genitourinary: Negative.    Hematological: Bruises/bleeds easily.     PAST MEDICAL/SURGICAL HISTORY:  Past Medical History:  Diagnosis Date  . Asthma   .  Blood transfusion    as child  . Diabetes ([facility])    type 2   . Heart murmur    d/t aortic stenosis   . History of kidney stones   . Hyperlipidemia   . Hypertension   . Normal echocardiogram   . Sleep apnea    Past Surgical History:  Procedure Laterality Date  . ABDOMINAL HYSTERECTOMY  2004  . CHOLECYSTECTOMY  05/30/2011   Procedure: LAPAROSCOPIC CHOLECYSTECTOMY;  Surgeon: Donato Heinz;  Location: [facility];  Service: General;  Laterality: N/A;  . 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  KIDNEY STONE SURGERY     2017 2-23  . PORTACATH PLACEMENT N/A 05/25/2017   Procedure: INSERTION PORT-A-CATH;  Surgeon: Alphonsa Overall, MD;  Location: [facility];  Service: General;  Laterality: N/A;     SOCIAL HISTORY:  Social History   Socioeconomic History  . Marital status: Married    Spouse name: Not on file  . Number of children: Not on file  . Years of education: Not on file  . Highest education level: Not on file  Social Needs  . Financial resource strain: Not on file  . Food insecurity - worry: Not on file  . Food insecurity - inability: Not on file  . Transportation needs - medical: Not on file  . Transportation needs - non-medical: Not on file  Occupational History  . Not on file  Tobacco Use  . Smoking status: Never Smoker  . Smokeless tobacco: Never Used  Substance and Sexual Activity  . Alcohol use: Yes    Comment: 2 x year  . Drug use: No  . Sexual activity: Yes    Birth control/protection: Surgical  Other Topics Concern  . Not on file  Social History Narrative  . Not on file    FAMILY HISTORY:  Family History  Problem Relation Age of Onset  . Lung cancer Maternal Grandmother   . Anesthesia problems Neg Hx   . Hypotension Neg Hx   . Malignant hyperthermia Neg Hx   . Pseudochol deficiency Neg Hx     CURRENT MEDICATIONS:  Outpatient Encounter Medications as of 10/30/2017  Medication Sig Note  . acetaminophen (TYLENOL) 325 MG tablet Take 650 mg by mouth every 6 (six) hours as needed.   Marland Kitchen albuterol (PROVENTIL HFA;VENTOLIN HFA) 108 (90 BASE) MCG/ACT inhaler Inhale 1 puff into the lungs every 6 (six) hours as needed for wheezing or shortness of breath.    . Black Cohosh 40 MG CAPS Take 40 mg by mouth at bedtime.    . diphenhydrAMINE (BENADRYL) 25 mg capsule Take 25 mg by mouth at bedtime as needed for allergies. For allergies    . diphenoxylate-atropine (LOMOTIL) 2.5-0.025 MG tablet TAKE (1) TABLET BY MOUTH (4) TIMES DAILY AS NEEDED FOR DIARRHEA OR LOOSE  STOOLS.   Marland Kitchen escitalopram (LEXAPRO) 20 MG tablet Take 20 mg by mouth at bedtime.    . fenofibrate 160 MG tablet Take 160 mg by mouth at bedtime.    Marland Kitchen HYDROcodone-acetaminophen (NORCO/VICODIN) 5-325 MG tablet Take 1 tablet by mouth every 6 (six) hours as needed for moderate pain.   Marland Kitchen losartan (COZAAR) 50 MG tablet Take 25 mg by mouth at bedtime.    . magic mouthwash w/lidocaine SOLN Take 5 mLs by mouth 4 (four) times daily as needed for mouth pain.   . metFORMIN (GLUCOPHAGE) 500 MG tablet Take 500 mg by mouth 2 (two) times daily  with a meal.   . Multiple Vitamin (MULTIVITAMIN) tablet Take 1 tablet by mouth daily.   Marland Kitchen omeprazole (PRILOSEC) 40 MG capsule TAKE ONE CAPSULE BY MOUTH ONCE DAILY.   Marland Kitchen prochlorperazine (COMPAZINE) 10 MG tablet TAKE (1) TABLET BY MOUTH EVERY SIX HOURS AS NEEDED FOR NAUSEA AND VOMITING.   . [DISCONTINUED] dexamethasone (DECADRON) 4 MG tablet Take 1 tablet (4 mg total) by mouth daily. 1 tab day before chemo and 1 tab day after 05/20/2017: Will start when starts chemtherapy   . [DISCONTINUED] lidocaine-prilocaine (EMLA) cream Apply to affected area once 05/20/2017: Will start when starts chemtherapy   . [DISCONTINUED] ondansetron (ZOFRAN) 8 MG tablet TAKE (1) TABLET TWICE A DAY AS NEEDED FOR NAUSEA/VOMITING. START ON DAY 3 AFTER CHEMO.    No facility-administered encounter medications on file as of 10/30/2017.     ALLERGIES:  Allergies  Allergen Reactions  . Latex Itching  . Pyridostigmine Bromide Other (See Comments)    Makes muscles twitch  . Statins Nausea Only     PHYSICAL EXAM:  ECOG Performance status: 1-2 - Symptomatic; requires occasional assistance.   BP 108/58 Pulse 68 Respirations 16 Temp 98.0 O2 sat 100%.  Weight 248.8 lbs    Physical Exam  Constitutional: She is oriented to person, place, and time.  Female in no acute distress. -Seen in chemo chair in infusion area.  HENT:  Head: Normocephalic.  Mouth/Throat: Oropharynx is clear and moist.    Eyes: Conjunctivae are normal. No scleral icterus.  Neck: Normal range of motion. Neck supple.  Cardiovascular: Normal rate and regular rhythm.  Murmur (Systolic murmur appreciated (may be more pronounced today than on previous exams).) heard. Pulmonary/Chest: Effort normal and breath sounds normal. No respiratory distress. She has no wheezes.  Abdominal: Soft. Bowel sounds are normal. There is no tenderness.  Musculoskeletal: Normal range of motion. She exhibits no edema.  Lymphadenopathy:    She has no cervical adenopathy.  Neurological: She is alert and oriented to person, place, and time. No cranial nerve deficit.  Skin: Skin is warm and dry. There is pallor (Skin and mucous membranes are very pale.).  Small area of ecchymosis to right upper quadrant of abdomen; no tenderness to palpation or associated hematoma.  Also small area of bruising to right knee area.  Patient does not recall any recent trauma to either of these areas.  Psychiatric: Mood, memory, affect and judgment normal.  Nursing note and vitals reviewed.    LABORATORY DATA:  I have reviewed the labs as listed.  CBC    Component Value Date/Time   WBC 3.6 (L) 10/30/2017 0941   RBC 2.35 (L) 10/30/2017 0941   HGB 7.5 (L) 10/30/2017 0941   HGB 13.8 04/22/2017 0831   HCT 23.6 (L) 10/30/2017 0941   HCT 41.9 04/22/2017 0831   PLT 59 (L) 10/30/2017 0941   PLT 184 04/22/2017 0831   MCV 100.4 (H) 10/30/2017 0941   MCV 85.7 04/22/2017 0831   MCH 31.9 10/30/2017 0941   MCHC 31.8 10/30/2017 0941   RDW 20.3 (H) 10/30/2017 0941   RDW 13.9 04/22/2017 0831   LYMPHSABS 1.0 10/30/2017 0941   LYMPHSABS 2.1 04/22/2017 0831   MONOABS 0.5 10/30/2017 0941   MONOABS 0.5 04/22/2017 0831   EOSABS 0.0 10/30/2017 0941   EOSABS 0.3 04/22/2017 0831   BASOSABS 0.0 10/30/2017 0941   BASOSABS 0.0 04/22/2017 0831   CMP Latest Ref Rng & Units 10/30/2017 10/09/2017 10/02/2017  Glucose 65 - 99 mg/dL 110(H) 130(H) 115(H)  BUN 6 - 20 mg/dL 11  25(H) 25(H)  Creatinine 0.44 - 1.00 mg/dL 0.90 0.90 0.93  Sodium 135 - 145 mmol/L 141 138 138  Potassium 3.5 - 5.1 mmol/L 3.9 4.3 4.0  Chloride 101 - 111 mmol/L 106 103 105  CO2 22 - 32 mmol/L _0 Calcium 8.9 - 10.3 mg/dL 10.0 10.5(H) 10.5(H)  Total Protein 6.5 - 8.1 g/dL 6.9 7.5 7.1  Total Bilirubin 0.3 - 1.2 mg/dL 0.7 0.7 0.9  Alkaline Phos 38 - 126 U/L 65 74 72  AST 15 - 41 U/L _1 ALT 14 - 54 U/L _2 PENDING LABS:    DIAGNOSTIC IMAGING:  *The following radiologic images and reports have been reviewed independently and agree with below findings.  Mammogram: 04/07/17 CLINICAL DATA:  Screening recall for right breast mass and calcifications. In addition, the patient states she now feels a palpable abnormality in her upper right breast since her recent screening mammogram.  EXAM: 2D DIGITAL DIAGNOSTIC UNILATERAL RIGHT MAMMOGRAM WITH CAD AND ADJUNCT TOMO  RIGHT BREAST ULTRASOUND  COMPARISON:  Screening mammogram dated 03/23/2017.  ACR Breast Density Category b: There are scattered areas of fibroglandular density.  FINDINGS: Cc and MLO tomograms of the right breast as well as spot compression magnification views of the right breast were performed. There is an irregular spiculated mass with associated microcalcifications in the superior right breast measuring approximately 2.1 cm. Spot compression magnification views of the retroareolar right breast demonstrate loosely grouped predominantly coarse calcifications, however some of which are amorphous and suspicious, spanning a distance of approximately 3.4 cm.  Mammographic images were processed with CAD.  Physical examination of the upper right breast reveals a firm fixed nodule at the approximate 12 o'clock position.  Targeted ultrasound of the right breast was performed demonstrating an irregular hypoechoic mass at 12 o'clock 9 cm from nipple measuring 2 x 1.3 x 2.1 cm. This corresponds  well with the mass seen at mammography. No definite lymphadenopathy seen in the right axilla.  IMPRESSION: 1. Highly suspicious right breast mass with associated calcifications measuring 2.1 cm.  2. Additional indeterminate calcifications in the retroareolar right breast.  RECOMMENDATION: 1. Ultrasound-guided biopsy of the mass in the right breast at the 12 o'clock position is recommended.  3. Stereotactic guided biopsy of the calcifications in the retroareolar right breast is recommended.  Biopsies are being scheduled for the patient at the [facility].  I have discussed the findings and recommendations with the patient. Results were also provided in writing at the conclusion of the visit. If applicable, a reminder letter will be sent to the patient regarding the next appointment.  BI-RADS CATEGORY  5: Highly suggestive of malignancy.   Electronically Signed   By: Everlean Alstrom M.D.   On: 04/07/2017 13:06   Last ECHO: 08/10/17  *[facility]*                         [address] [place], [place] [address]                            [phone]  -------------------------------------------------------------------  Transthoracic Echocardiography  Patient:    Sharronda, Schweers MR #:       [id number] Study Date: 08/10/2017 Gender:     F Age:        57 Height:     154.9 cm Weight:     112.5 kg BSA:        2.27 m^2 Pt. Status: Room:   ATTENDING    Default, Provider [address], [place]  Elenore Paddy, M.D.  REFERRING    Loralie Champagne, M.D.  PERFORMING   [facility], Outpatient  SONOGRAPHER  Chelsea Androw  cc:  ------------------------------------------------------------------- LV EF: 65% -   70%  ------------------------------------------------------------------- Indications:      (Chemo  I74.9).  ------------------------------------------------------------------- History:   PMH:  Breast cancer  Risk factors:  Murmur Hypertension. Diabetes mellitus. Dyslipidemia.  ------------------------------------------------------------------- Study Conclusions  - Left ventricle: The cavity size was normal. Wall thickness was   increased in a pattern of mild LVH. Systolic function was   vigorous. The estimated ejection fraction was in the range of 65%   to 70%. Wall motion was normal; there were no regional wall   motion abnormalities. Doppler parameters are consistent with   abnormal left ventricular relaxation (grade 1 diastolic   dysfunction). GLS -19.6%. - Aortic valve: Trileaflet; moderately calcified leaflets. There   was moderate stenosis. There was trivial regurgitation. Mean   gradient (S): 26 mm Hg. Peak gradient (S): 52 mm Hg. Valve area   (VTI): 1.3 cm^2. - Mitral valve: Moderately calcified annulus. - Right ventricle: The cavity size was normal. Systolic function   was normal. - Pulmonary arteries: No complete TR doppler jet unable to estimate   PA systolic pressure. - Inferior vena cava: The vessel was normal in size. The   respirophasic diameter changes were in the normal range (>= 50%),   consistent with normal central venous pressure.  Impressions:  - Normal LV size with mild LV hypertrophy. EF 65-70%. Strain as   above. Normal RV size and systolic function. Moderate aortic   stenosis.       PATHOLOGY:  (R) breast biopsy: 04/13/17     (R) axillary lymph node biopsy: 05/08/17         ASSESSMENT & PLAN:   Stage IIB invasive ductal carcinoma of (R) breast; ER+/PR+/HER2+:  -Recalled from screening mammogram in 03/2017 for (R) breast abnormality. Diagnostic mammogram/ultrasound of (R) breast suspicious for malignancy. Underwent (R) breast biopsy on 04/13/17 revealing invasive ductal carcinoma, grade 3, ER+, PR+, HER2+. Seen initially in  consultation with Dr. Lindi Adie at [facility], and patient elected to have her treatment closer to home and presented to [facility] for cycle #1 neoadjuvant chemo with [facility] on 05/29/17. .   -Completed neoadjuvant TCHP chemo on 10/09/17.  -She was scheduled to see her surgeon, Dr. Lucia Gaskins, today.  However, she had to reschedule this appointment due to her recent injury that her husband sustained.  It is very important to her that her husband be present for this appointment.  She tells me her appointment has been rescheduled for 12/02/17 with Dr. Lucia Gaskins. -I will reach out to Dr. Lucia Gaskins via an basket message in epic to see if he would like any additional breast imaging while she is waiting to see him in February.  Her main treatment breast MRI showed only partial response to both the primary breast mass and axillary adenopathy.  If Dr. Lucia Gaskins would like additional imaging before surgery, we are  happy to help coordinate this for him.  We will await his response to in basket message. -Due for maintenance Herceptin/Perjeta today.  We will hold treatment today given her symptomatic anemia and pending ECHO.  -Return to cancer center next week for Herceptin/Perjeta.  -Future plan will include Herceptin/Perjeta every 3 weeks for total of 17 cycles (to complete 1 year of anti-HER2 therapy).  After surgery, she may require radiation. Then, she will need oral anti-estrogen therapy for 5-10 years.    Symptomatic anemia:  -Hgb 7.5 g/dL today.  She is symptomatic with shortness of breath and DOE.  Will transfuse 2 units PRBCs today.  Anemia is likely secondary to chemotherapy.  No reporting bleeding episodes per patient.  Chest tightness/progressive shortness of breath:  -Likely secondary to anemia.  However, given her murmur and potential for cardiotoxicity with Herceptin/per data, we will hold treatment today.   -Last ECHO on 08/10/17 showed excellent EF at 65-70%.  -Her routine 58-monthECHO was rescheduled for Monday,  11/02/17 (d/t recent winter weather).  We will bring her back next week after ECHO to ensure that her heart function has not declined and is suitable to continue anti-HER2 therapy.  Thrombocytopenia: -Platelets 59,000 today.  She does have some new bruising to the right upper abdomen and right leg, without any known history of trauma.  No pain or underlying hematoma appreciated on physical exam.  Likely due to chemotherapy/bone marrow suppression.  Encouraged her to exercise caution given low platelet count.  No additional intervention needed at this time; counts will likely recover over the next couple of weeks.      Dispo:  -Herceptin/Perjeta held today.  Transfuse 2 units PRBCs today.  -ECHO on Monday, 11/02/17. -Return to cancer center next week for Herceptin/Perjeta.     All questions were answered to patient's stated satisfaction. Encouraged patient to call with any new concerns or questions before her next visit to the cancer center and we can certain see her sooner, if needed.    Plan of care discussed with Dr. PSherrine Maples who agrees with the above aforementioned.    Orders placed this encounter:  No orders of the defined types were placed in this encounter.     GMike Craze NP [phone]

## 2017-10-30 NOTE — Progress Notes (Signed)
Per Mike Craze NP-will hold treatment today. Waiting to get lab results.  2 units of blood ordered for today.  Treatment given per orders. Patient tolerated it well without problems. Vitals stable and discharged home from clinic ambulatory. Follow up as scheduled.

## 2017-10-31 ENCOUNTER — Encounter (HOSPITAL_COMMUNITY): Payer: Self-pay | Admitting: Adult Health

## 2017-11-01 LAB — BPAM RBC
BLOOD PRODUCT EXPIRATION DATE: 201901182359
BLOOD PRODUCT EXPIRATION DATE: 201902022359
ISSUE DATE / TIME: 201901181227
ISSUE DATE / TIME: 201901181404
UNIT TYPE AND RH: 6200
Unit Type and Rh: 5100

## 2017-11-01 LAB — TYPE AND SCREEN
ABO/RH(D): A POS
Antibody Screen: NEGATIVE
UNIT DIVISION: 0
Unit division: 0

## 2017-11-02 ENCOUNTER — Ambulatory Visit (HOSPITAL_COMMUNITY)
Admission: RE | Admit: 2017-11-02 | Discharge: 2017-11-02 | Disposition: A | Discharge status: Home / Self Care | Payer: 59 | Source: Ambulatory Visit | Attending: Adult Health | Admitting: Adult Health

## 2017-11-02 DIAGNOSIS — I119 Hypertensive heart disease without heart failure: Secondary | ICD-10-CM | POA: Insufficient documentation

## 2017-11-02 DIAGNOSIS — E119 Type 2 diabetes mellitus without complications: Secondary | ICD-10-CM | POA: Diagnosis not present

## 2017-11-02 DIAGNOSIS — C50919 Malignant neoplasm of unspecified site of unspecified female breast: Secondary | ICD-10-CM | POA: Insufficient documentation

## 2017-11-02 DIAGNOSIS — I080 Rheumatic disorders of both mitral and aortic valves: Secondary | ICD-10-CM | POA: Diagnosis not present

## 2017-11-02 DIAGNOSIS — E785 Hyperlipidemia, unspecified: Secondary | ICD-10-CM | POA: Diagnosis not present

## 2017-11-02 DIAGNOSIS — Z170 Estrogen receptor positive status [ER+]: Secondary | ICD-10-CM | POA: Diagnosis not present

## 2017-11-02 DIAGNOSIS — C50211 Malignant neoplasm of upper-inner quadrant of right female breast: Secondary | ICD-10-CM | POA: Insufficient documentation

## 2017-11-02 NOTE — Progress Notes (Signed)
*  PRELIMINARY RESULTS* Echocardiogram 2D Echocardiogram has been performed.  Leavy Cella 11/02/2017, 11:22 AM

## 2017-11-03 ENCOUNTER — Encounter: Payer: Self-pay | Admitting: Oncology

## 2017-11-03 ENCOUNTER — Inpatient Hospital Stay (HOSPITAL_BASED_OUTPATIENT_CLINIC_OR_DEPARTMENT_OTHER): Payer: 59 | Admitting: Adult Health

## 2017-11-03 ENCOUNTER — Encounter (HOSPITAL_COMMUNITY): Payer: Self-pay | Admitting: Adult Health

## 2017-11-03 ENCOUNTER — Inpatient Hospital Stay (HOSPITAL_COMMUNITY): Payer: 59

## 2017-11-03 ENCOUNTER — Encounter (HOSPITAL_COMMUNITY): Payer: Self-pay

## 2017-11-03 VITALS — BP 121/60 | HR 66 | Temp 98.0°F | Resp 16

## 2017-11-03 DIAGNOSIS — D6481 Anemia due to antineoplastic chemotherapy: Secondary | ICD-10-CM

## 2017-11-03 DIAGNOSIS — Z170 Estrogen receptor positive status [ER+]: Secondary | ICD-10-CM | POA: Diagnosis not present

## 2017-11-03 DIAGNOSIS — E119 Type 2 diabetes mellitus without complications: Secondary | ICD-10-CM

## 2017-11-03 DIAGNOSIS — R011 Cardiac murmur, unspecified: Secondary | ICD-10-CM | POA: Diagnosis not present

## 2017-11-03 DIAGNOSIS — C773 Secondary and unspecified malignant neoplasm of axilla and upper limb lymph nodes: Secondary | ICD-10-CM | POA: Diagnosis not present

## 2017-11-03 DIAGNOSIS — E785 Hyperlipidemia, unspecified: Secondary | ICD-10-CM

## 2017-11-03 DIAGNOSIS — Z5112 Encounter for antineoplastic immunotherapy: Secondary | ICD-10-CM | POA: Diagnosis not present

## 2017-11-03 DIAGNOSIS — G4730 Sleep apnea, unspecified: Secondary | ICD-10-CM

## 2017-11-03 DIAGNOSIS — R12 Heartburn: Secondary | ICD-10-CM

## 2017-11-03 DIAGNOSIS — C50211 Malignant neoplasm of upper-inner quadrant of right female breast: Secondary | ICD-10-CM

## 2017-11-03 DIAGNOSIS — Z79899 Other long term (current) drug therapy: Secondary | ICD-10-CM

## 2017-11-03 DIAGNOSIS — Z794 Long term (current) use of insulin: Secondary | ICD-10-CM

## 2017-11-03 DIAGNOSIS — J45909 Unspecified asthma, uncomplicated: Secondary | ICD-10-CM | POA: Diagnosis not present

## 2017-11-03 DIAGNOSIS — Z87442 Personal history of urinary calculi: Secondary | ICD-10-CM

## 2017-11-03 DIAGNOSIS — I10 Essential (primary) hypertension: Secondary | ICD-10-CM

## 2017-11-03 DIAGNOSIS — D696 Thrombocytopenia, unspecified: Secondary | ICD-10-CM | POA: Diagnosis not present

## 2017-11-03 DIAGNOSIS — Z801 Family history of malignant neoplasm of trachea, bronchus and lung: Secondary | ICD-10-CM

## 2017-11-03 LAB — CBC WITH DIFFERENTIAL/PLATELET
BASOS ABS: 0 10*3/uL (ref 0.0–0.1)
Basophils Relative: 1 %
EOS ABS: 0 10*3/uL (ref 0.0–0.7)
Eosinophils Relative: 0 %
HEMATOCRIT: 31.2 % — AB (ref 36.0–46.0)
Hemoglobin: 9.9 g/dL — ABNORMAL LOW (ref 12.0–15.0)
LYMPHS PCT: 32 %
Lymphs Abs: 1.2 10*3/uL (ref 0.7–4.0)
MCH: 31.1 pg (ref 26.0–34.0)
MCHC: 31.7 g/dL (ref 30.0–36.0)
MCV: 98.1 fL (ref 78.0–100.0)
MONOS PCT: 14 %
Monocytes Absolute: 0.5 10*3/uL (ref 0.1–1.0)
NEUTROS ABS: 2.2 10*3/uL (ref 1.7–7.7)
NEUTROS PCT: 53 %
Platelets: 90 10*3/uL — ABNORMAL LOW (ref 150–400)
RBC: 3.18 MIL/uL — AB (ref 3.87–5.11)
RDW: 18.7 % — AB (ref 11.5–15.5)
WBC: 3.9 10*3/uL — AB (ref 4.0–10.5)

## 2017-11-03 LAB — COMPREHENSIVE METABOLIC PANEL
ALT: 18 U/L (ref 14–54)
AST: 21 U/L (ref 15–41)
Albumin: 4 g/dL (ref 3.5–5.0)
Alkaline Phosphatase: 65 U/L (ref 38–126)
Anion gap: 9 (ref 5–15)
BILIRUBIN TOTAL: 0.6 mg/dL (ref 0.3–1.2)
BUN: 22 mg/dL — AB (ref 6–20)
CHLORIDE: 106 mmol/L (ref 101–111)
CO2: 24 mmol/L (ref 22–32)
Calcium: 10 mg/dL (ref 8.9–10.3)
Creatinine, Ser: 0.99 mg/dL (ref 0.44–1.00)
GFR calc Af Amer: >60 mL/min (ref >60)
GLUCOSE: 125 mg/dL — AB (ref 65–99)
POTASSIUM: 4.1 mmol/L (ref 3.5–5.1)
Sodium: 139 mmol/L (ref 135–145)
TOTAL PROTEIN: 7 g/dL (ref 6.5–8.1)

## 2017-11-03 MED ORDER — ACETAMINOPHEN 325 MG PO TABS
650.0000 mg | ORAL_TABLET | Freq: Once | ORAL | Status: AC
  Administered 2017-11-03: 650 mg via ORAL

## 2017-11-03 MED ORDER — DIPHENHYDRAMINE HCL 25 MG PO CAPS
50.0000 mg | ORAL_CAPSULE | Freq: Once | ORAL | Status: AC
  Administered 2017-11-03: 50 mg via ORAL

## 2017-11-03 MED ORDER — SODIUM CHLORIDE 0.9 % IV SOLN
420.0000 mg | Freq: Once | INTRAVENOUS | Status: AC
  Administered 2017-11-03: 420 mg via INTRAVENOUS
  Filled 2017-11-03: qty 14

## 2017-11-03 MED ORDER — SODIUM CHLORIDE 0.9% FLUSH
10.0000 mL | INTRAVENOUS | Status: DC | PRN
  Administered 2017-11-03: 10 mL
  Filled 2017-11-03: qty 10

## 2017-11-03 MED ORDER — HEPARIN SOD (PORK) LOCK FLUSH 100 UNIT/ML IV SOLN
500.0000 [IU] | Freq: Once | INTRAVENOUS | Status: AC | PRN
  Administered 2017-11-03: 500 [IU]

## 2017-11-03 MED ORDER — OMEPRAZOLE 40 MG PO CPDR: 40.0000 mg | DELAYED_RELEASE_CAPSULE | Freq: Every day | ORAL | 5 refills | Status: AC

## 2017-11-03 MED ORDER — SODIUM CHLORIDE 0.9 % IV SOLN
Freq: Once | INTRAVENOUS | Status: AC
  Administered 2017-11-03: 11:00:00 via INTRAVENOUS

## 2017-11-03 MED ORDER — TRASTUZUMAB CHEMO 150 MG IV SOLR
6.0000 mg/kg | Freq: Once | INTRAVENOUS | Status: AC
  Administered 2017-11-03: 672 mg via INTRAVENOUS
  Filled 2017-11-03: qty 32

## 2017-11-03 MED ORDER — ACETAMINOPHEN 325 MG PO TABS
ORAL_TABLET | ORAL | Status: AC
  Filled 2017-11-03: qty 2

## 2017-11-03 MED ORDER — DIPHENHYDRAMINE HCL 25 MG PO CAPS
ORAL_CAPSULE | ORAL | Status: AC
  Filled 2017-11-03: qty 2

## 2017-11-03 NOTE — Patient Instructions (Signed)
[facility] Discharge Instructions for Patients Receiving Chemotherapy  Today you received the following chemotherapy agents herceptin and perjeta.    If you develop nausea and vomiting that is not controlled by your nausea medication, call the clinic.   BELOW ARE SYMPTOMS THAT SHOULD BE REPORTED IMMEDIATELY:  *FEVER GREATER THAN 100.5 F  *CHILLS WITH OR WITHOUT FEVER  NAUSEA AND VOMITING THAT IS NOT CONTROLLED WITH YOUR NAUSEA MEDICATION  *UNUSUAL SHORTNESS OF BREATH  *UNUSUAL BRUISING OR BLEEDING  TENDERNESS IN MOUTH AND THROAT WITH OR WITHOUT PRESENCE OF ULCERS  *URINARY PROBLEMS  *BOWEL PROBLEMS  UNUSUAL RASH Items with * indicate a potential emergency and should be followed up as soon as possible.  Feel free to call the clinic should you have any questions or concerns. The clinic phone number is [phone].  Please show the CHEMO ALERT CARD at check-in to the Emergency Department and triage nurse.   

## 2017-11-03 NOTE — Progress Notes (Signed)
[place], [place] [address]   CLINIC:  Medical Oncology/Hematology  PCP:  Sharilyn Sites, [place] [address] [phone]   REASON FOR VISIT:  Follow-up for Stage IIB invasive ductal carcinoma of (R) breast; ER+/PR+/HER2+  CURRENT THERAPY: Herceptin/Perjeta every 3 weeks (pending breast surgery with Dr. Lucia Gaskins).    BRIEF ONCOLOGIC HISTORY:    Malignant neoplasm of upper-inner quadrant of right breast in female, estrogen receptor positive ([place])   04/13/2017 Initial Diagnosis    Screening detected right breast mass with calcifications 2.1 cm in size and 3.4 cm in size. Calcifications were fibroadenoma. Right breast biopsy upper inner quadrant 1:00 mass: IDC with DCIS with necrosis, grade 3, ER 100%, PR 70%, Ki-67 60%, HER-2 positive ratio 2.34, T2 N1 stage IB (New AJCC)      04/25/2017 Breast MRI    Right breast upper inner quadrant 2.2 x 1.7 x 2.7 cm mass, abnormal area of clumped non-mass enhancement in the right lateral breast 3.2 x 1.9 x 1 cm, too abnormal lymph nodes right axilla 4.3 and 1.7 cm (biopsy-proven breast cancer)       05/08/2017 Procedure    Right axilla lymph node biopsy: Metastatic carcinoma      05/13/2017 Procedure    Right breast biopsy retroareolar region: Intraductal papilloma         INTERVAL HISTORY:  [name] 57 y.o. female returns for routine follow-up for right breast cancer.    Here today for Herceptin/Perjeta alone.  She is s/p cycle #6 TCHP on 10/09/17.  Since her last visit on Friday, 10/30/17 when she received 2 units PRBCs, she tells me that she feels much better.  Her shortness of breath and "chest tightness" has improved and she has no recurring symptoms.  She denies any bleeding episodes.  She had echocardiogram on 11/02/17 and would like to review those results today.  Due today for next cycle of Herceptin/Perjeta.  Denies any diarrhea; this was a chronic issue for her  during chemotherapy.  Endorses that her diarrhea has completely resolved.  Otherwise, she is largely without complaints today.   REVIEW OF SYSTEMS:  Review of Systems  Constitutional: Positive for fatigue. Negative for chills and fever.  HENT:  Negative.   Eyes: Negative.   Respiratory: Negative.  Negative for chest tightness, cough and shortness of breath.   Cardiovascular: Negative.   Gastrointestinal: Negative.  Negative for diarrhea.  Endocrine: Negative.   Genitourinary: Negative.    Musculoskeletal: Negative.   Skin: Negative.   Neurological: Negative.   Hematological: Negative.   Psychiatric/Behavioral: Negative.      PAST MEDICAL/SURGICAL HISTORY:  Past Medical History:  Diagnosis Date  . Asthma   . Blood transfusion    as child  . Diabetes ([place])    type 2   . Heart murmur    d/t aortic stenosis   . History of kidney stones   . Hyperlipidemia   . Hypertension   . Normal echocardiogram   . Sleep apnea    Past Surgical History:  Procedure Laterality Date  . ABDOMINAL HYSTERECTOMY  2004  . CHOLECYSTECTOMY  05/30/2011   Procedure: LAPAROSCOPIC CHOLECYSTECTOMY;  Surgeon: Donato Heinz;  Location: [facility];  Service: General;  Laterality: N/A;  . 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 KIDNEY STONE SURGERY     2017 2-23  . PORTACATH PLACEMENT N/A 05/25/2017   Procedure: INSERTION PORT-A-CATH;  Surgeon: Alphonsa Overall, MD;  Location: [facility];  Service: General;  Laterality: N/A;     SOCIAL HISTORY:  Social History   Socioeconomic History  . Marital status: Married    Spouse name: Not on file  . Number of children: Not on file  . Years of education: Not on file  . Highest education level: Not on file  Social Needs  . Financial resource strain: Not on file  . Food insecurity - worry: Not on file  . Food insecurity - inability: Not on file  . Transportation needs - medical: Not on file  . Transportation needs - non-medical: Not on  file  Occupational History  . Not on file  Tobacco Use  . Smoking status: Never Smoker  . Smokeless tobacco: Never Used  Substance and Sexual Activity  . Alcohol use: Yes    Comment: 2 x year  . Drug use: No  . Sexual activity: Yes    Birth control/protection: Surgical  Other Topics Concern  . Not on file  Social History Narrative  . Not on file    FAMILY HISTORY:  Family History  Problem Relation Age of Onset  . Lung cancer Maternal Grandmother   . Anesthesia problems Neg Hx   . Hypotension Neg Hx   . Malignant hyperthermia Neg Hx   . Pseudochol deficiency Neg Hx     CURRENT MEDICATIONS:  Outpatient Encounter Medications as of 11/03/2017  Medication Sig  . acetaminophen (TYLENOL) 325 MG tablet Take 650 mg by mouth every 6 (six) hours as needed.  [place] Kitchen albuterol (PROVENTIL HFA;VENTOLIN HFA) 108 (90 BASE) MCG/ACT inhaler Inhale 1 puff into the lungs every 6 (six) hours as needed for wheezing or shortness of breath.   . Black Cohosh 40 MG CAPS Take 40 mg by mouth at bedtime.   . diphenhydrAMINE (BENADRYL) 25 mg capsule Take 25 mg by mouth at bedtime as needed for allergies. For allergies   . diphenoxylate-atropine (LOMOTIL) 2.5-0.025 MG tablet TAKE (1) TABLET BY MOUTH (4) TIMES DAILY AS NEEDED FOR DIARRHEA OR LOOSE STOOLS.  [place] Kitchen escitalopram (LEXAPRO) 20 MG tablet Take 20 mg by mouth at bedtime.   . fenofibrate 160 MG tablet Take 160 mg by mouth at bedtime.   [place] Kitchen HYDROcodone-acetaminophen (NORCO/VICODIN) 5-325 MG tablet Take 1 tablet by mouth every 6 (six) hours as needed for moderate pain.  Mar[place] Kitchen lidocaine-prilocaine (EMLA) cream   . losartan (COZAAR) 50 MG tablet Take 25 mg by mouth at bedtime.   . magic mouthwash w/lidocaine SOLN Take 5 mLs by mouth 4 (four) times daily as needed for mouth pain.  . metFORMIN (GLUCOPHAGE) 500 MG tablet Take 500 mg by mouth 2 (two) times daily with a meal.  . Multiple Vitamin (MULTIVITAMIN) tablet Take 1 tablet by mouth daily.  [place] Kitchen omeprazole (PRILOSEC) 40  MG capsule Take 1 capsule (40 mg total) by mouth daily.  . ondansetron (ZOFRAN) 8 MG tablet   . prochlorperazine (COMPAZINE) 10 MG tablet TAKE (1) TABLET BY MOUTH EVERY SIX HOURS AS NEEDED FOR NAUSEA AND VOMITING.  . [DISCONTINUED] omeprazole (PRILOSEC) 40 MG capsule TAKE ONE CAPSULE BY MOUTH ONCE DAILY.   No facility-administered encounter medications on file as of 11/03/2017.     ALLERGIES:  Allergies  Allergen Reactions  . Latex Itching  . Pyridostigmine Bromide Other (See Comments)    Makes muscles twitch  . Statins Nausea Only     PHYSICAL EXAM:  ECOG Performance status: 1-2 - Symptomatic; requires occasional assistance.   BP  130/70 Pulse 68 Respirations 20 Temp 98.1 O2 sat 98%  Weight 247.4 lbs    Physical Exam  Constitutional: She is oriented to person, place, and time and well-developed, well-nourished, and in no distress.  Seen in chemo chair in infusion area   HENT:  Head: Normocephalic.  Mouth/Throat: Oropharynx is clear and moist.  Eyes: Conjunctivae are normal. No scleral icterus.  Neck: Normal range of motion. Neck supple.  Cardiovascular: Normal rate and regular rhythm.  Murmur (mild murmur (much improved from exam last week) ) heard. Pulmonary/Chest: Effort normal and breath sounds normal. No respiratory distress.  Abdominal: Soft. Bowel sounds are normal. There is no tenderness.  Musculoskeletal: Normal range of motion. She exhibits no edema.  Lymphadenopathy:    She has no cervical adenopathy.       Right: No supraclavicular adenopathy present.       Left: No supraclavicular adenopathy present.  Neurological: She is alert and oriented to person, place, and time. No cranial nerve deficit.  Skin: Skin is warm and dry. No rash noted. No pallor.  Psychiatric: Mood, memory, affect and judgment normal.  Nursing note and vitals reviewed.    LABORATORY DATA:  I have reviewed the labs as listed.  CBC    Component Value Date/Time   WBC 3.9 (L)  11/03/2017 0930   RBC 3.18 (L) 11/03/2017 0930   HGB 9.9 (L) 11/03/2017 0930   HGB 13.8 04/22/2017 0831   HCT 31.2 (L) 11/03/2017 0930   HCT 41.9 04/22/2017 0831   PLT 90 (L) 11/03/2017 0930   PLT 184 04/22/2017 0831   MCV 98.1 11/03/2017 0930   MCV 85.7 04/22/2017 0831   MCH 31.1 11/03/2017 0930   MCHC 31.7 11/03/2017 0930   RDW 18.7 (H) 11/03/2017 0930   RDW 13.9 04/22/2017 0831   LYMPHSABS 1.2 11/03/2017 0930   LYMPHSABS 2.1 04/22/2017 0831   MONOABS 0.5 11/03/2017 0930   MONOABS 0.5 04/22/2017 0831   EOSABS 0.0 11/03/2017 0930   EOSABS 0.3 04/22/2017 0831   BASOSABS 0.0 11/03/2017 0930   BASOSABS 0.0 04/22/2017 0831   CMP Latest Ref Rng & Units 11/03/2017 10/30/2017 10/09/2017  Glucose 65 - 99 mg/dL 125(H) 110(H) 130(H)  BUN 6 - 20 mg/dL 22(H) 11 25(H)  Creatinine 0.44 - 1.00 mg/dL 0.99 0.90 0.90  Sodium 135 - 145 mmol/L 139 141 138  Potassium 3.5 - 5.1 mmol/L 4.1 3.9 4.3  Chloride 101 - 111 mmol/L 106 106 103  CO2 22 - 32 mmol/L _0 Calcium 8.9 - 10.3 mg/dL 10.0 10.0 10.5(H)  Total Protein 6.5 - 8.1 g/dL 7.0 6.9 7.5  Total Bilirubin 0.3 - 1.2 mg/dL 0.6 0.7 0.7  Alkaline Phos 38 - 126 U/L 65 65 74  AST 15 - 41 U/L _1 ALT 14 - 54 U/L _2 PENDING LABS:    DIAGNOSTIC IMAGING:  *The following radiologic images and reports have been reviewed independently and agree with below findings.  Mammogram: 04/07/17 CLINICAL DATA:  Screening recall for right breast mass and calcifications. In addition, the patient states she now feels a palpable abnormality in her upper right breast since her recent screening mammogram.  EXAM: 2D DIGITAL DIAGNOSTIC UNILATERAL RIGHT MAMMOGRAM WITH CAD AND ADJUNCT TOMO  RIGHT BREAST ULTRASOUND  COMPARISON:  Screening mammogram dated 03/23/2017.  ACR Breast Density Category b: There are scattered areas of fibroglandular density.  FINDINGS: Cc and MLO tomograms of the right breast as well as spot  compression magnification views of the right breast were performed. There is an irregular spiculated mass with associated microcalcifications in the superior right breast measuring approximately 2.1 cm. Spot compression magnification views of the retroareolar right breast demonstrate loosely grouped predominantly coarse calcifications, however some of which are amorphous and suspicious, spanning a distance of approximately 3.4 cm.  Mammographic images were processed with CAD.  Physical examination of the upper right breast reveals a firm fixed nodule at the approximate 12 o'clock position.  Targeted ultrasound of the right breast was performed demonstrating an irregular hypoechoic mass at 12 o'clock 9 cm from nipple measuring 2 x 1.3 x 2.1 cm. This corresponds well with the mass seen at mammography. No definite lymphadenopathy seen in the right axilla.  IMPRESSION: 1. Highly suspicious right breast mass with associated calcifications measuring 2.1 cm.  2. Additional indeterminate calcifications in the retroareolar right breast.  RECOMMENDATION: 1. Ultrasound-guided biopsy of the mass in the right breast at the 12 o'clock position is recommended.  3. Stereotactic guided biopsy of the calcifications in the retroareolar right breast is recommended.  Biopsies are being scheduled for the patient at the [facility].  I have discussed the findings and recommendations with the patient. Results were also provided in writing at the conclusion of the visit. If applicable, a reminder letter will be sent to the patient regarding the next appointment.  BI-RADS CATEGORY  5: Highly suggestive of malignancy.   Electronically Signed   By: Everlean Alstrom M.D.   On: 04/07/2017 13:06   Last ECHO: 11/02/17 *[place], [place] [address]                             [phone]  ------------------------------------------------------------------- Transthoracic Echocardiography  Patient:    Marie, Borowski MR #:       [id number] Study Date: 11/02/2017 Gender:     F Age:        57 Height:     154.9 cm Weight:     112.9 kg BSA:        2.27 m^2 Pt. Status: Room:   SONOGRAPHER  [facility]  PERFORMING   [facility], [facility]  ATTENDING    Laurelyn Sickle, [place]  REFERRING    Mike Craze W  cc:  ------------------------------------------------------------------- LV EF: 60% -   65%  ------------------------------------------------------------------- History:   PMH:  Breast Cancer. Breast Cancer.  Murmur.  Risk factors:  Hypertension. Diabetes mellitus. Dyslipidemia.  ------------------------------------------------------------------- Study Conclusions  - Left ventricle: The cavity size was normal. Wall thickness was   increased in a pattern of mild LVH. Systolic function was normal.   The estimated ejection fraction was in the range of 60% to 65%.   Wall motion was normal; there were no regional wall motion   abnormalities. The study is not technically sufficient to allow   evaluation of LV diastolic function. - Aortic valve: Mildly calcified annulus. Trileaflet; mildly   thickened leaflets. There was moderate stenosis. Mean gradient   (S): 20 mm Hg. VTI ratio of LVOT to aortic valve: 0.47. Valve  area (VTI): 1.48 cm^2. Valve area (Vmax): 1.54 cm^2. Valve area   (Vmean): 1.33 cm^2. - Mitral valve: Mildly to moderately calcified annulus. Normal   thickness leaflets . - Technically adequate study.       PATHOLOGY:  (R) breast biopsy: 04/13/17     (R) axillary lymph node biopsy: 05/08/17         ASSESSMENT & PLAN:   Stage IIB invasive ductal carcinoma of (R) breast; ER+/PR+/HER2+:  -Recalled from screening mammogram in 03/2017 for (R) breast abnormality. Diagnostic  mammogram/ultrasound of (R) breast suspicious for malignancy. Underwent (R) breast biopsy on 04/13/17 revealing invasive ductal carcinoma, grade 3, ER+, PR+, HER2+. Seen initially in consultation with Dr. Lindi Adie at [facility], and patient elected to have her treatment closer to home and presented to [facility] for cycle #1 neoadjuvant chemo with [facility] on 05/29/17. .   -Completed neoadjuvant TCHP chemo on 10/09/17.  -Labs reviewed and are adequate for Herceptin/Perjeta as planned today.  Most recent ECHO completed on 11/02/17 showed excellent EF 60-65% (previously 65-70%); results reviewed with patient today.  Only very mild decrease noted in EF, but with adequate/normal EF. Will keep monitoring every 3 months with ECHO (or sooner if clinically indicated).  -Received return Inbasket message from Dr. Lucia Gaskins. He would like pt to have MRI breasts to evaluate her response to treatment since completing chemotherapy.  He plans to talk with his staff to see if her appt with him can be moved up (currently scheduled for late 11/2017).  Orders placed today for MRI bilat breasts w/wo contrast; will try to get done sometime within the next week or so.   -Plan to continue Herceptin/Perjeta every 3 weeks for total of 17 cycles (to complete 1 year of anti-HER2 therapy).  -After surgery, she may require radiation. Then, she will need oral anti-estrogen therapy for 5-10 years.    Symptomatic anemia/Thrombocytopenia:  -Improved. Hgb 9.9 g/dL today. Clinically, she feels much better.  Plt count a bit better than previous and is 90,000 today. Counts will likely continue to recover over the next few weeks as her bone marrow suppression improves s/p chemo.    Heartburn:  -Improved since starting Prilosec. E-scribed refill to her pharmacy today.       Dispo:  -Herceptin/Perjeta today as scheduled.  -MRI breasts sometime within the next week or so; orders placed today.  -Return to cancer center for follow-up in 3 weeks with  next cycle of Herceptin/Perjeta.     All questions were answered to patient's stated satisfaction. Encouraged patient to call with any new concerns or questions before her next visit to the cancer center and we can certain see her sooner, if needed.       Orders placed this encounter:  Orders Placed This Encounter  Procedures  . MR BREAST BILATERAL W [place] CAD      Mike Craze, NP [place] [phone]

## 2017-11-03 NOTE — Progress Notes (Signed)
Echo 11/02/17.    Patient to treatment area for therapy.  Denied SOB, pain, weakness, or fatigue.  Denied neuropathy, fevers and chills.  Blood transfusion last week and the patient stated she felt better afterwards.    Patient seen by Mike Craze, NP, and ok to treat today.   Patient tolerated treatment with no complaints voiced.  Port site clean and dry with no bruising or swelling noted at site.  Band aid applied. VSS with discharge and left ambulatory with no s/s of distress noted.

## 2017-11-06 ENCOUNTER — Ambulatory Visit: Payer: Self-pay | Admitting: Surgery

## 2017-11-06 DIAGNOSIS — C50911 Malignant neoplasm of unspecified site of right female breast: Secondary | ICD-10-CM

## 2017-11-11 ENCOUNTER — Ambulatory Visit
Admission: RE | Admit: 2017-11-11 | Discharge: 2017-11-11 | Disposition: A | Discharge status: Home / Self Care | Payer: 59 | Source: Ambulatory Visit | Attending: Adult Health | Admitting: Adult Health

## 2017-11-11 ENCOUNTER — Ambulatory Visit: Payer: Self-pay | Admitting: Surgery

## 2017-11-11 DIAGNOSIS — Z170 Estrogen receptor positive status [ER+]: Principal | ICD-10-CM

## 2017-11-11 DIAGNOSIS — C50911 Malignant neoplasm of unspecified site of right female breast: Secondary | ICD-10-CM

## 2017-11-11 DIAGNOSIS — C50211 Malignant neoplasm of upper-inner quadrant of right female breast: Secondary | ICD-10-CM

## 2017-11-11 MED ORDER — GADOBENATE DIMEGLUMINE 529 MG/ML IV SOLN
20.0000 mL | Freq: Once | INTRAVENOUS | Status: AC | PRN
  Administered 2017-11-11: 20 mL via INTRAVENOUS

## 2017-11-20 ENCOUNTER — Ambulatory Visit (HOSPITAL_COMMUNITY): Payer: 59

## 2017-11-20 ENCOUNTER — Ambulatory Visit (HOSPITAL_COMMUNITY): Payer: 59 | Admitting: Internal Medicine

## 2017-11-23 ENCOUNTER — Other Ambulatory Visit: Payer: Self-pay | Admitting: Surgery

## 2017-11-23 DIAGNOSIS — C50911 Malignant neoplasm of unspecified site of right female breast: Secondary | ICD-10-CM

## 2017-11-24 ENCOUNTER — Inpatient Hospital Stay (HOSPITAL_COMMUNITY): Payer: 59 | Attending: Internal Medicine | Admitting: Internal Medicine

## 2017-11-24 ENCOUNTER — Encounter (HOSPITAL_COMMUNITY): Payer: Self-pay

## 2017-11-24 ENCOUNTER — Inpatient Hospital Stay (HOSPITAL_COMMUNITY): Payer: 59

## 2017-11-24 VITALS — BP 125/64 | HR 56 | Temp 97.9°F | Resp 18 | Wt 248.4 lb

## 2017-11-24 DIAGNOSIS — I10 Essential (primary) hypertension: Secondary | ICD-10-CM | POA: Insufficient documentation

## 2017-11-24 DIAGNOSIS — D696 Thrombocytopenia, unspecified: Secondary | ICD-10-CM | POA: Diagnosis not present

## 2017-11-24 DIAGNOSIS — G4730 Sleep apnea, unspecified: Secondary | ICD-10-CM | POA: Diagnosis not present

## 2017-11-24 DIAGNOSIS — Z170 Estrogen receptor positive status [ER+]: Secondary | ICD-10-CM | POA: Diagnosis not present

## 2017-11-24 DIAGNOSIS — R011 Cardiac murmur, unspecified: Secondary | ICD-10-CM | POA: Diagnosis not present

## 2017-11-24 DIAGNOSIS — Z7984 Long term (current) use of oral hypoglycemic drugs: Secondary | ICD-10-CM | POA: Insufficient documentation

## 2017-11-24 DIAGNOSIS — Z87442 Personal history of urinary calculi: Secondary | ICD-10-CM | POA: Insufficient documentation

## 2017-11-24 DIAGNOSIS — Z79899 Other long term (current) drug therapy: Secondary | ICD-10-CM | POA: Insufficient documentation

## 2017-11-24 DIAGNOSIS — C773 Secondary and unspecified malignant neoplasm of axilla and upper limb lymph nodes: Secondary | ICD-10-CM | POA: Insufficient documentation

## 2017-11-24 DIAGNOSIS — C50211 Malignant neoplasm of upper-inner quadrant of right female breast: Secondary | ICD-10-CM

## 2017-11-24 DIAGNOSIS — D649 Anemia, unspecified: Secondary | ICD-10-CM | POA: Diagnosis not present

## 2017-11-24 DIAGNOSIS — Z801 Family history of malignant neoplasm of trachea, bronchus and lung: Secondary | ICD-10-CM | POA: Diagnosis not present

## 2017-11-24 DIAGNOSIS — E785 Hyperlipidemia, unspecified: Secondary | ICD-10-CM | POA: Diagnosis not present

## 2017-11-24 DIAGNOSIS — R634 Abnormal weight loss: Secondary | ICD-10-CM | POA: Diagnosis not present

## 2017-11-24 DIAGNOSIS — Z9049 Acquired absence of other specified parts of digestive tract: Secondary | ICD-10-CM

## 2017-11-24 DIAGNOSIS — Z5112 Encounter for antineoplastic immunotherapy: Secondary | ICD-10-CM | POA: Diagnosis not present

## 2017-11-24 DIAGNOSIS — E119 Type 2 diabetes mellitus without complications: Secondary | ICD-10-CM | POA: Insufficient documentation

## 2017-11-24 DIAGNOSIS — Z9221 Personal history of antineoplastic chemotherapy: Secondary | ICD-10-CM | POA: Insufficient documentation

## 2017-11-24 DIAGNOSIS — I350 Nonrheumatic aortic (valve) stenosis: Secondary | ICD-10-CM | POA: Insufficient documentation

## 2017-11-24 DIAGNOSIS — J45909 Unspecified asthma, uncomplicated: Secondary | ICD-10-CM | POA: Insufficient documentation

## 2017-11-24 DIAGNOSIS — Z90710 Acquired absence of both cervix and uterus: Secondary | ICD-10-CM | POA: Diagnosis not present

## 2017-11-24 MED ORDER — SODIUM CHLORIDE 0.9 % IV SOLN
420.0000 mg | Freq: Once | INTRAVENOUS | Status: AC
  Administered 2017-11-24: 420 mg via INTRAVENOUS
  Filled 2017-11-24: qty 14

## 2017-11-24 MED ORDER — SODIUM CHLORIDE 0.9 % IV SOLN
Freq: Once | INTRAVENOUS | Status: AC
Start: 2017-11-24 — End: 2017-11-24
  Administered 2017-11-24: 10:00:00 via INTRAVENOUS

## 2017-11-24 MED ORDER — SODIUM CHLORIDE 0.9% FLUSH
10.0000 mL | INTRAVENOUS | Status: DC | PRN
  Administered 2017-11-24: 10 mL
  Filled 2017-11-24: qty 10

## 2017-11-24 MED ORDER — ACETAMINOPHEN 325 MG PO TABS
650.0000 mg | ORAL_TABLET | Freq: Once | ORAL | Status: AC
  Administered 2017-11-24: 650 mg via ORAL

## 2017-11-24 MED ORDER — HEPARIN SOD (PORK) LOCK FLUSH 100 UNIT/ML IV SOLN
500.0000 [IU] | Freq: Once | INTRAVENOUS | Status: AC | PRN
  Administered 2017-11-24: 500 [IU]

## 2017-11-24 MED ORDER — TRASTUZUMAB CHEMO 150 MG IV SOLR
6.0000 mg/kg | Freq: Once | INTRAVENOUS | Status: AC
  Administered 2017-11-24: 672 mg via INTRAVENOUS
  Filled 2017-11-24: qty 32

## 2017-11-24 MED ORDER — DIPHENHYDRAMINE HCL 25 MG PO CAPS
50.0000 mg | ORAL_CAPSULE | Freq: Once | ORAL | Status: AC
  Administered 2017-11-24: 50 mg via ORAL

## 2017-11-24 MED ORDER — ACETAMINOPHEN 325 MG PO TABS
ORAL_TABLET | ORAL | Status: AC
  Filled 2017-11-24: qty 2

## 2017-11-24 MED ORDER — DIPHENHYDRAMINE HCL 25 MG PO CAPS
ORAL_CAPSULE | ORAL | Status: AC
  Filled 2017-11-24: qty 2

## 2017-11-24 NOTE — Patient Instructions (Signed)
[facility] Discharge Instructions for Patients Receiving Chemotherapy   Beginning January 23rd 2017 lab work for the Cancer Center will be done in the  Main lab at [facility] on 1st floor. If you have a lab appointment with the Cancer Center please come in thru the  Main Entrance and check in at the main information desk   Today you received the following chemotherapy agents Herceptin and Perjeta. Follow-up as scheduled. Call clinic for any questions or concerns  To help prevent nausea and vomiting after your treatment, we encourage you to take your nausea medication   If you develop nausea and vomiting, or diarrhea that is not controlled by your medication, call the clinic.  The clinic phone number is [phone]. Office hours are Monday-Friday 8:30am-5:00pm.  BELOW ARE SYMPTOMS THAT SHOULD BE REPORTED IMMEDIATELY:  *FEVER GREATER THAN 101.0 F  *CHILLS WITH OR WITHOUT FEVER  NAUSEA AND VOMITING THAT IS NOT CONTROLLED WITH YOUR NAUSEA MEDICATION  *UNUSUAL SHORTNESS OF BREATH  *UNUSUAL BRUISING OR BLEEDING  TENDERNESS IN MOUTH AND THROAT WITH OR WITHOUT PRESENCE OF ULCERS  *URINARY PROBLEMS  *BOWEL PROBLEMS  UNUSUAL RASH Items with * indicate a potential emergency and should be followed up as soon as possible. If you have an emergency after office hours please contact your primary care physician or go to the nearest emergency department.  Please call the clinic during office hours if you have any questions or concerns.   You may also contact the Patient Navigator at [phone] should you have any questions or need assistance in obtaining follow up care.      Resources For Cancer Patients and their Caregivers ? American Cancer Society: Can assist with transportation, wigs, general needs, runs Look Good Feel Better.        1-888-227-6333 ? Cancer Care: Provides financial assistance, online support groups, medication/co-pay assistance.   1-800-813-HOPE (4673) ? [facility] Assists [place] cancer patients and their families through emotional , educational and financial support.  336-427-4357 ? [place] DSS Where to apply for food stamps, Medicaid and utility assistance. 336-342-1394 ? [facility]: Transportation to medical appointments. 336-347-2287 ? Social Security Administration: May apply for disability if have a Stage IV cancer. 336-342-7796 1-800-772-1213 ? [place] Aging, Disability and Transit Services: Assists with nutrition, care and transit needs. 336-349-2343         

## 2017-11-24 NOTE — Patient Instructions (Signed)
[place] at [facility] Discharge Instructions  RECOMMENDATIONS MADE BY THE CONSULTANT AND ANY TEST RESULTS WILL BE SENT TO YOUR REFERRING PHYSICIAN.  You were seen today by Dr. Zoila Shutter We will proceed with treatment today and we will not restart treatment until 6 weeks after your surgery. Please call us in the mean time if you should need anything.  Thank you for choosing [place] at [facility] to provide your oncology and hematology care.  To afford each patient quality time with our provider, please arrive at least 15 minutes before your scheduled appointment time.    If you have a lab appointment with the [facility] please come in thru the  Main Entrance and check in at the main information desk  You need to re-schedule your appointment should you arrive 10 or more minutes late.  We strive to give you quality time with our providers, and arriving late affects you and other patients whose appointments are after yours.  Also, if you no show three or more times for appointments you may be dismissed from the clinic at the providers discretion.     Again, thank you for choosing [facility].  Our hope is that these requests will decrease the amount of time that you wait before being seen by our physicians.       _____________________________________________________________  Should you have questions after your visit to [facility], please contact our office at [phone] between the hours of 8:30 a.m. and 4:30 p.m.  Voicemails left after 4:30 p.m. will not be returned until the following business day.  For prescription refill requests, have your pharmacy contact our office.       Resources For Cancer Patients and their Caregivers ? American Cancer Society: Can assist with transportation, wigs, general needs, runs Look Good Feel Better.        715-621-4769 ? Cancer Care: Provides financial assistance,  online support groups, medication/co-pay assistance.  1-800-813-HOPE (315) 006-3143) ? [facility] Assists [place] cancer patients and their families through emotional , educational and financial support.  418-746-8118 ? [place] DSS Where to apply for food stamps, Medicaid and utility assistance. 470-049-7025 ? [facility]: Transportation to medical appointments. 818-530-8601 ? Social Security Administration: May apply for disability if have a Stage IV cancer. 340-390-0225 9200970699 ? [employer], Disability and Transit Services: Assists with nutrition, care and transit needs. [place] Support Programs: @10RELATIVEDAYS @ > Cancer Support Group  2nd Tuesday of the month 1pm-2pm, Journey Room  > Creative Journey  3rd Tuesday of the month 1130am-1pm, Journey Room  > Look Good Feel Better  1st Wednesday of the month 10am-12 noon, Journey Room (Call [place] to register [phone])

## 2017-11-24 NOTE — Progress Notes (Signed)
Renee Harris tolerated Herceptin and Perjeta infusions well without complaints or incident. Labs reviewed with and pt seen by Dr. Walden Field prior to administering these medications. VSS upon discharge. Pt discharged self ambulatory in satisfactory condition

## 2017-11-30 NOTE — Progress Notes (Signed)
Diagnosis No diagnosis found.  Staging Cancer Staging Malignant neoplasm of upper-inner quadrant of right breast in female, estrogen receptor positive ([place]) Staging form: Breast, AJCC 8th Edition - Clinical stage from 04/22/2017: Stage IB (cT2, cN0, cM0, G3, ER: Positive, PR: Positive, HER2: Positive) - Unsigned Staging comments: Staged at breast conference on 7.11.18   Assessment and Plan:  ASSESSMENT & PLAN:   1.  Stage IIB invasive ductal carcinoma of (R) breast; ER+/PR+/HER2+: Pt had screening mammogram in 03/2017 for (R) breast abnormality. Diagnostic mammogram/ultrasound of (R) breast suspicious for malignancy. Underwent (R) breast biopsy on 04/13/17 revealing invasive ductal carcinoma, grade 3, ER+, PR+, HER2+. Seen initially in consultation with Dr. Lindi Adie at [facility], and patient elected to have her treatment closer to home and presented to [facility] for cycle #1 neoadjuvant chemo with [facility] on 05/29/17. .   Completed neoadjuvant [facility]P chemo on 10/09/17. She was being followed by NP Renato Battles and Dr. [place].  She was scheduled to see her surgeon, Dr. Lucia Gaskins, but she had to reschedule this appointment due to  recent injury that her husband sustained.  It is very important to her that her husband be present for this appointment.  Reportedly, her appointment has been rescheduled for 12/02/17 with Dr. Lucia Gaskins. Her main treatment breast MRI showed only partial response to both the primary breast mass and axillary adenopathy.  If Dr. Lucia Gaskins would like additional imaging before surgery, we are happy to help coordinate this for him.  NP will coordinate this with Dr. Lucia Gaskins.  Recent MRI done 11/11/2017 showed right breast lesion now measuring 2.3 cm previously 2.7 cm per MRI report.    Pt on maintenance Herceptin/Perjeta today.  ECHO done 11/02/2017 showed normal EF 60-65%. She will RTC once surgery has been completed.  She is planned to complete a year of anti Her 2 therapy.  After surgery, she may require  radiation. Then, she will need oral anti-estrogen therapy for 5-10 years.   2.  Anemia.  Hgb 10 on recent labs.  Pt recently transfused.    3.  Thrombocytopenia.  Plts 90,000 on recent labs.  Anticipate counts will continue to recover.    Current Status:  Pt seen today for followup prior to Herceptin and Perjeta.  She is here to go over ECHO.      Malignant neoplasm of upper-inner quadrant of right breast in female, estrogen receptor positive ([place])   04/13/2017 Initial Diagnosis    Screening detected right breast mass with calcifications 2.1 cm in size and 3.4 cm in size. Calcifications were fibroadenoma. Right breast biopsy upper inner quadrant 1:00 mass: IDC with DCIS with necrosis, grade 3, ER 100%, PR 70%, Ki-67 60%, HER-2 positive ratio 2.34, T2 N1 stage IB (New AJCC)      04/25/2017 Breast MRI    Right breast upper inner quadrant 2.2 x 1.7 x 2.7 cm mass, abnormal area of clumped non-mass enhancement in the right lateral breast 3.2 x 1.9 x 1 cm, too abnormal lymph nodes right axilla 4.3 and 1.7 cm (biopsy-proven breast cancer)       05/08/2017 Procedure    Right axilla lymph node biopsy: Metastatic carcinoma      05/13/2017 Procedure    Right breast biopsy retroareolar region: Intraductal papilloma       Problem List Patient Active Problem List   Diagnosis Date Noted  . Weight loss [R63.4] 06/13/2017  . Malignant neoplasm of upper-inner quadrant of right breast in female, estrogen receptor positive ([place]) [C50.211, Z17.0] 04/16/2017  Past Medical History Past Medical History:  Diagnosis Date  . Asthma   . Blood transfusion    as child  . Diabetes ([place])    type 2   . Heart murmur    d/t aortic stenosis   . History of kidney stones   . Hyperlipidemia   . Hypertension   . Normal echocardiogram   . Sleep apnea     Past Surgical History Past Surgical History:  Procedure Laterality Date  . ABDOMINAL HYSTERECTOMY  2004  . CHOLECYSTECTOMY  05/30/2011   Procedure:  LAPAROSCOPIC CHOLECYSTECTOMY;  Surgeon: Donato Heinz;  Location: [facility];  Service: General;  Laterality: N/A;  . 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 KIDNEY STONE SURGERY     2017 2-23  . PORTACATH PLACEMENT N/A 05/25/2017   Procedure: INSERTION PORT-A-CATH;  Surgeon: Alphonsa Overall, MD;  Location: [facility];  Service: General;  Laterality: N/A;    Family History Family History  Problem Relation Age of Onset  . Lung cancer Maternal Grandmother   . Anesthesia problems Neg Hx   . Hypotension Neg Hx   . Malignant hyperthermia Neg Hx   . Pseudochol deficiency Neg Hx      Social History  reports that  has never smoked. she has never used smokeless tobacco. She reports that she drinks alcohol. She reports that she does not use drugs.  Medications  Current Outpatient Medications:  .  acetaminophen (TYLENOL) 500 MG tablet, Take 1,000 mg by mouth daily as needed for moderate pain or headache., Disp: , Rfl:  .  albuterol (PROVENTIL HFA;VENTOLIN HFA) 108 (90 BASE) MCG/ACT inhaler, Inhale 1 puff into the lungs every 6 (six) hours as needed for wheezing or shortness of breath. , Disp: , Rfl:  .  diphenhydrAMINE (BENADRYL) 25 mg capsule, Take 25 mg by mouth at bedtime as needed for allergies. , Disp: , Rfl:  .  diphenoxylate-atropine (LOMOTIL) 2.5-0.025 MG tablet, TAKE (1) TABLET BY MOUTH (4) TIMES DAILY AS NEEDED FOR DIARRHEA OR LOOSE STOOLS., Disp: 45 tablet, Rfl: 0 .  escitalopram (LEXAPRO) 20 MG tablet, Take 20 mg by mouth at bedtime. , Disp: , Rfl:  .  fenofibrate 160 MG tablet, Take 160 mg by mouth at bedtime. , Disp: , Rfl:  .  fluticasone (FLONASE) 50 MCG/ACT nasal spray, Place 1 spray into both nostrils daily as needed for allergies or rhinitis., Disp: , Rfl:  .  HYDROcodone-acetaminophen (NORCO/VICODIN) 5-325 MG tablet, Take 1 tablet by mouth every 6 (six) hours as needed for moderate pain. (Patient not taking: Reported on 11/26/2017), Disp: 15 tablet, Rfl: 0 .   lidocaine-prilocaine (EMLA) cream, Apply 1 application topically daily as needed (port access). , Disp: , Rfl:  .  loperamide (IMODIUM A-D) 2 MG tablet, Take 4 mg by mouth as needed for diarrhea or loose stools., Disp: , Rfl:  .  losartan (COZAAR) 50 MG tablet, Take 25 mg by mouth at bedtime. , Disp: , Rfl:  .  magic mouthwash w/lidocaine SOLN, Take 5 mLs by mouth 4 (four) times daily as needed for mouth pain., Disp: 360 mL, Rfl: 1 .  metFORMIN (GLUCOPHAGE) 500 MG tablet, Take 500 mg by mouth 2 (two) times daily with a meal., Disp: , Rfl:  .  Multiple Vitamin (MULTIVITAMIN) tablet, Take 1 tablet by mouth at bedtime. , Disp: , Rfl:  .  neomycin-bacitracin-polymyxin (NEOSPORIN) ointment, Apply 1 application topically as needed for wound care., Disp: ,  Rfl:  .  omeprazole (PRILOSEC) 40 MG capsule, Take 1 capsule (40 mg total) by mouth daily. (Patient taking differently: Take 40 mg by mouth daily as needed (heartburn). ), Disp: 30 capsule, Rfl: 5 .  prochlorperazine (COMPAZINE) 10 MG tablet, TAKE (1) TABLET BY MOUTH EVERY SIX HOURS AS NEEDED FOR NAUSEA AND VOMITING., Disp: 30 tablet, Rfl: 0  Allergies Latex; Pyridostigmine bromide; and Statins  Review of Systems Review of Systems - Oncology ROS as per HPI otherwise 12 point ROS is negative.   Physical Exam  Vitals Wt Readings from Last 3 Encounters:  11/24/17 248 lb 6.4 oz (112.7 kg)  10/30/17 248 lb 12.8 oz (112.9 kg)  10/09/17 246 lb 6.4 oz (111.8 kg)   Temp Readings from Last 3 Encounters:  11/24/17 97.9 F (36.6 C) (Oral)  11/03/17 98 F (36.7 C) (Oral)  10/30/17 98.6 F (37 C) (Oral)   BP Readings from Last 3 Encounters:  11/24/17 125/64  11/03/17 121/60  10/30/17 (!) 114/53   Pulse Readings from Last 3 Encounters:  11/24/17 (!) 56  11/03/17 66  10/30/17 (!) 57   Constitutional: Well-developed, well-nourished, and in no distress.   HENT:  Head: Normocephalic and atraumatic.  Mouth/Throat: No oropharyngeal exudate.  Mucosa moist. Eyes: Pupils are equal, round, and reactive to light. Conjunctivae are normal. No scleral icterus.  Neck: Normal range of motion. Neck supple. No JVD present.  Cardiovascular: Normal rate, regular rhythm and normal heart sounds.  Exam reveals no gallop and no friction rub.   No murmur heard. Bilateral breast exam:  Palpable area noted in right breast.  No Palpable lesions noted in left breast.   Pulmonary/Chest: Effort normal and breath sounds normal. No respiratory distress. No wheezes.No rales.  Abdominal: Soft. Bowel sounds are normal. No distension. There is no tenderness. There is no guarding.  Musculoskeletal: No edema or tenderness.  Lymphadenopathy: No cervical, axillary or supraclavicular adenopathy.  Neurological: Alert and oriented to person, place, and time. No cranial nerve deficit.  Skin: Skin is warm and dry. No rash noted. No erythema. No pallor.  Psychiatric: Affect and judgment normal.   Labs No visits with results within 3 Day(s) from this visit.  Latest known visit with results is:  Infusion on 11/03/2017  Component Date Value Ref Range Status  . WBC 11/03/2017 3.9* 4.0 - 10.5 K/uL Final  . RBC 11/03/2017 3.18* 3.87 - 5.11 MIL/uL Final  . Hemoglobin 11/03/2017 9.9* 12.0 - 15.0 g/dL Final  . HCT 11/03/2017 31.2* 36.0 - 46.0 % Final  . MCV 11/03/2017 98.1  78.0 - 100.0 fL Final  . MCH 11/03/2017 31.1  26.0 - 34.0 pg Final  . MCHC 11/03/2017 31.7  30.0 - 36.0 g/dL Final  . RDW 11/03/2017 18.7* 11.5 - 15.5 % Final  . Platelets 11/03/2017 90* 150 - 400 K/uL Final   Comment: SPECIMEN CHECKED FOR CLOTS PLATELET COUNT CONFIRMED BY SMEAR   . Neutrophils Relative % 11/03/2017 53  % Final  . Lymphocytes Relative 11/03/2017 32  % Final  . Monocytes Relative 11/03/2017 14  % Final  . Eosinophils Relative 11/03/2017 0  % Final  . Basophils Relative 11/03/2017 1  % Final  . Neutro Abs 11/03/2017 2.2  1.7 - 7.7 K/uL Final  . Lymphs Abs 11/03/2017 1.2  0.7 - 4.0  K/uL Final  . Monocytes Absolute 11/03/2017 0.5  0.1 - 1.0 K/uL Final  . Eosinophils Absolute 11/03/2017 0.0  0.0 - 0.7 K/uL Final  . Basophils Absolute 11/03/2017  0.0  0.0 - 0.1 K/uL Final  . Sodium 11/03/2017 139  135 - 145 mmol/L Final  . Potassium 11/03/2017 4.1  3.5 - 5.1 mmol/L Final  . Chloride 11/03/2017 106  101 - 111 mmol/L Final  . CO2 11/03/2017 24  22 - 32 mmol/L Final  . Glucose, Bld 11/03/2017 125* 65 - 99 mg/dL Final  . BUN 11/03/2017 22* 6 - 20 mg/dL Final  . Creatinine, Ser 11/03/2017 0.99  0.44 - 1.00 mg/dL Final  . Calcium 11/03/2017 10.0  8.9 - 10.3 mg/dL Final  . Total Protein 11/03/2017 7.0  6.5 - 8.1 g/dL Final  . Albumin 11/03/2017 4.0  3.5 - 5.0 g/dL Final  . AST 11/03/2017 21  15 - 41 U/L Final  . ALT 11/03/2017 18  14 - 54 U/L Final  . Alkaline Phosphatase 11/03/2017 65  38 - 126 U/L Final  . Total Bilirubin 11/03/2017 0.6  0.3 - 1.2 mg/dL Final  . GFR calc non Af Amer 11/03/2017 >60  >60 mL/min Final  . GFR calc Af Amer 11/03/2017 >60  >60 mL/min Final   Comment: (NOTE) The eGFR has been calculated using the CKD EPI equation. This calculation has not been validated in all clinical situations. eGFR's persistently <60 mL/min signify possible Chronic Kidney Disease.   . Anion gap 11/03/2017 9  5 - 15 Final    Echo done 11/02/2017:   Normal LV size with mild LV hypertrophy. EF 65-70%. Strain as   above. Normal RV size and systolic function. Moderate aortic   stenosis.   PATHOLOGY:  (R) breast biopsy: 04/13/17     (R) axillary lymph node biopsy: 05/08/17    MRI breast done 11/11/2017: Mass within the upper portion of the right breast now measures 2.3 x 1.5 x 1.9 centimeters. On the initial exam, lesion measured 2.7 x 1.7 x 2.2 centimeters. On the mid treatment exam, lesion measured 2.1 x 1.2 x 1.9 centimeters. Lesion continues to exhibit rapid wash-in and washout type enhancement kinetics and is associated tissue marker clip artifact. No additional  areas of concern within the right breast.   Zoila Shutter MD

## 2017-12-02 NOTE — Pre-Procedure Instructions (Addendum)
Renee Harris  12/02/2017      [place], [place] [address] [place] [address] Phone: [phone] Fax: [phone]    Your procedure is scheduled on Wednesday, December 09, 2017  Report to [facility] Admitting Entrance "A" at 6:30AM  Call this number if you have problems the morning of surgery:  [phone]   Remember:  Do not eat food or drink liquids after midnight.  Take these medicines the morning of surgery with A SIP OF WATER: If needed Omeprazole (PRILOSEC) for acid relief, Prochlorperazine (COMPAZINE) for nausea, Loperamide (IMODIUM A-D) for diarrhea,  Acetaminophen (TYLENOL) for pain, Fluticasone (FLONASE) for nasal congestion, and Albuterol Inhaler for cough or wheezing(Bring with you the day of surgery).  As of today, stop taking all Aspirins, Vitamins, Fish oils, and Herbal medications. Also stop all NSAIDS i.e. Advil, Ibuprofen, Motrin, Aleve, Anaprox, Naproxen, BC and Goody Powders.  Please complete your PRE-SURGERY ENSURE that was given to before you leave your house the morning of surgery.  Please, if able, drink it in one setting. DO NOT SIP.  How to Manage Your Diabetes Before and After Surgery  WHAT DO I DO ABOUT MY DIABETES MEDICATION?  Marland Kitchen Do not take MetFORMIN (GLUCOPHAGE) the morning of surgery.  . If your CBG is greater than 220 mg/dL, call us at [phone]  Why is it important to control my blood sugar before and after surgery? . Improving blood sugar levels before and after surgery helps healing and can limit problems. . A way of improving blood sugar control is eating a healthy diet by: o  Eating less sugar and carbohydrates o  Increasing activity/exercise o  Talking with your doctor about reaching your blood sugar goals . High blood sugars (greater than 180 mg/dL) can raise your risk of infections and slow your recovery, so you will need to focus on controlling your  diabetes during the weeks before surgery. . Make sure that the doctor who takes care of your diabetes knows about your planned surgery including the date and location.  How do I manage my blood sugar before surgery? . Check your blood sugar at least 4 times a day, starting 2 days before surgery, to make sure that the level is not too high or low. o Check your blood sugar the morning of your surgery when you wake up and every 2 hours until you get to the Short Stay unit. . If your blood sugar is less than 70 mg/dL, you will need to treat for low blood sugar: o Do not take insulin. o Treat a low blood sugar (less than 70 mg/dL) with  cup of clear juice (cranberry or apple), 4 glucose tablets, OR glucose gel. Recheck blood sugar in 15 minutes after treatment (to make sure it is greater than 70 mg/dL). If your blood sugar is not greater than 70 mg/dL on recheck, call [phone] o  for further instructions. . Report your blood sugar to the short stay nurse when you get to Short Stay.  . If you are admitted to the hospital after surgery: o Your blood sugar will be checked by the staff and you will probably be given insulin after surgery (instead of oral diabetes medicines) to make sure you have good blood sugar levels. o The goal for blood sugar control after surgery is 80-180 mg/dL.   Do not wear jewelry, make-up or nail polish.  Do not wear lotions, powders, perfumes, or  deodorant.  Do not shave 48 hours prior to surgery.   Do not bring valuables to the hospital.  [facility] is not responsible for any belongings or valuables.  Contacts, dentures or bridgework may not be worn into surgery.  Leave your suitcase in the car.  After surgery it may be brought to your room.  For patients admitted to the hospital, discharge time will be determined by your treatment team.  Patients discharged the day of surgery will not be allowed to drive home.   Special instructions:   [place]- Preparing  For Surgery  Before surgery, you can play an important role. Because skin is not sterile, your skin needs to be as free of germs as possible. You can reduce the number of germs on your skin by washing with CHG (chlorahexidine gluconate) Soap before surgery.  CHG is an antiseptic cleaner which kills germs and bonds with the skin to continue killing germs even after washing.  Please do not use if you have an allergy to CHG or antibacterial soaps. If your skin becomes reddened/irritated stop using the CHG.  Do not shave (including legs and underarms) for at least 48 hours prior to first CHG shower. It is OK to shave your face.  Please follow these instructions carefully.   1. Shower the NIGHT BEFORE SURGERY and the MORNING OF SURGERY with CHG.   2. If you chose to wash your hair, wash your hair first as usual with your normal shampoo.  3. After you shampoo, rinse your hair and body thoroughly to remove the shampoo.  4. Use CHG as you would any other liquid soap. You can apply CHG directly to the skin and wash gently with a scrungie or a clean washcloth.   5. Apply the CHG Soap to your body ONLY FROM THE NECK DOWN.  Do not use on open wounds or open sores. Avoid contact with your eyes, ears, mouth and genitals (private parts). Wash Face and genitals (private parts)  with your normal soap.  6. Wash thoroughly, paying special attention to the area where your surgery will be performed.  7. Thoroughly rinse your body with warm water from the neck down.  8. DO NOT shower/wash with your normal soap after using and rinsing off the CHG Soap.  9. Pat yourself dry with a CLEAN TOWEL.  10. Wear CLEAN PAJAMAS to bed the night before surgery, wear comfortable clothes the morning of surgery  11. Place CLEAN SHEETS on your bed the night of your first shower and DO NOT SLEEP WITH PETS.  Day of Surgery: Do not apply any deodorants/lotions. Please wear clean clothes to the hospital/surgery center.     Please read over the following fact sheets that you were given. Pain Booklet, Coughing and Deep Breathing and Surgical Site Infection Prevention

## 2017-12-03 ENCOUNTER — Other Ambulatory Visit: Payer: Self-pay

## 2017-12-03 ENCOUNTER — Encounter (HOSPITAL_COMMUNITY): Payer: Self-pay

## 2017-12-03 ENCOUNTER — Encounter (HOSPITAL_COMMUNITY)
Admission: RE | Admit: 2017-12-03 | Discharge: 2017-12-03 | Disposition: A | Discharge status: Home / Self Care | Payer: 59 | Source: Ambulatory Visit | Attending: Surgery | Admitting: Surgery

## 2017-12-03 DIAGNOSIS — Z01812 Encounter for preprocedural laboratory examination: Secondary | ICD-10-CM | POA: Diagnosis not present

## 2017-12-03 HISTORY — DX: Malignant (primary) neoplasm, unspecified: C80.1

## 2017-12-03 HISTORY — DX: Gastro-esophageal reflux disease without esophagitis: K21.9

## 2017-12-03 HISTORY — DX: Headache, unspecified: R51.9

## 2017-12-03 HISTORY — DX: Headache: R51

## 2017-12-03 LAB — CBC WITH DIFFERENTIAL/PLATELET
BASOS ABS: 0 10*3/uL (ref 0.0–0.1)
Basophils Relative: 0 %
Eosinophils Absolute: 0.2 10*3/uL (ref 0.0–0.7)
Eosinophils Relative: 3 %
HEMATOCRIT: 35 % — AB (ref 36.0–46.0)
HEMOGLOBIN: 11.5 g/dL — AB (ref 12.0–15.0)
Lymphocytes Relative: 33 %
Lymphs Abs: 1.9 10*3/uL (ref 0.7–4.0)
MCH: 31 pg (ref 26.0–34.0)
MCHC: 32.9 g/dL (ref 30.0–36.0)
MCV: 94.3 fL (ref 78.0–100.0)
Monocytes Absolute: 0.5 10*3/uL (ref 0.1–1.0)
Monocytes Relative: 8 %
NEUTROS ABS: 3.2 10*3/uL (ref 1.7–7.7)
NEUTROS PCT: 56 %
Platelets: 118 10*3/uL — ABNORMAL LOW (ref 150–400)
RBC: 3.71 MIL/uL — AB (ref 3.87–5.11)
RDW: 14.6 % (ref 11.5–15.5)
WBC: 5.7 10*3/uL (ref 4.0–10.5)

## 2017-12-03 LAB — SURGICAL PCR SCREEN
MRSA, PCR: NEGATIVE
STAPHYLOCOCCUS AUREUS: NEGATIVE

## 2017-12-03 LAB — BASIC METABOLIC PANEL
ANION GAP: 10 (ref 5–15)
BUN: 16 mg/dL (ref 6–20)
CALCIUM: 10.3 mg/dL (ref 8.9–10.3)
CHLORIDE: 106 mmol/L (ref 101–111)
CO2: 25 mmol/L (ref 22–32)
Creatinine, Ser: 0.94 mg/dL (ref 0.44–1.00)
GFR calc Af Amer: >60 mL/min (ref >60)
GFR calc non Af Amer: >60 mL/min (ref >60)
Glucose, Bld: 111 mg/dL — ABNORMAL HIGH (ref 65–99)
POTASSIUM: 4.1 mmol/L (ref 3.5–5.1)
Sodium: 141 mmol/L (ref 135–145)

## 2017-12-03 LAB — GLUCOSE, CAPILLARY: Glucose-Capillary: 147 mg/dL — ABNORMAL HIGH (ref 65–99)

## 2017-12-03 LAB — HEMOGLOBIN A1C
Hgb A1c MFr Bld: 4.3 % — ABNORMAL LOW (ref 4.8–5.6)
MEAN PLASMA GLUCOSE: 76.71 mg/dL

## 2017-12-03 NOTE — Progress Notes (Addendum)
PCP is Dr. Sharilyn Sites Cardiologist is Dr. Lindi Adie  Denies chest pain, fever, or cough Reports fasting CBG's run 130-140's, but doesn't check often. Reports her Dr took her off Metformin. Reports her last A1c was 4.8 requested report and last office visit from Dr Hilma Favors. Echo noted 11-02-17 Denies ever having a card cath or stress test. Pre-surgical Ensure given to pt with instructions.  States she has sleep apnea, but doesn't wear CPAP, only snores when on her back.

## 2017-12-07 ENCOUNTER — Other Ambulatory Visit: Payer: Self-pay | Admitting: Surgery

## 2017-12-07 ENCOUNTER — Ambulatory Visit
Admission: RE | Admit: 2017-12-07 | Discharge: 2017-12-07 | Disposition: A | Discharge status: Home / Self Care | Payer: 59 | Source: Ambulatory Visit | Attending: Surgery | Admitting: Surgery

## 2017-12-07 DIAGNOSIS — C50911 Malignant neoplasm of unspecified site of right female breast: Secondary | ICD-10-CM

## 2017-12-08 ENCOUNTER — Other Ambulatory Visit (HOSPITAL_COMMUNITY): Payer: 59

## 2017-12-08 NOTE — Anesthesia Preprocedure Evaluation (Addendum)
Anesthesia Evaluation  Patient identified by MRN, date of birth, ID band Patient awake    Reviewed: Allergy & Precautions, NPO status , Patient's Chart, lab work & pertinent test results  Airway Mallampati: II  TM Distance: >3 FB Neck ROM: Full    Dental  (+) Dental Advisory Given   Pulmonary asthma , sleep apnea ,    Pulmonary exam normal breath sounds clear to auscultation       Cardiovascular hypertension, Pt. on medications Normal cardiovascular exam+ Valvular Problems/Murmurs AS  Rhythm:Regular Rate:Normal + Systolic murmurs '19 TTE - Mild LVH. EF was in the range of 60% to 65%.   Moderate AS.   Neuro/Psych  Headaches, negative psych ROS   GI/Hepatic Neg liver ROS, GERD  Medicated and Controlled,  Endo/Other  diabetes, Type 2Morbid obesity  Renal/GU negative Renal ROS     Musculoskeletal negative musculoskeletal ROS (+)   Abdominal   Peds  Hematology  (+) anemia , Thrombocytopenia   Anesthesia Other Findings   Reproductive/Obstetrics                            Anesthesia Physical  Anesthesia Plan  ASA: III  Anesthesia Plan: General   Post-op Pain Management:  Regional for Post-op pain   Induction: Intravenous  PONV Risk Score and Plan: 3 and Ondansetron, Dexamethasone, Midazolam and Treatment may vary due to age or medical condition  Airway Management Planned: LMA  Additional Equipment: None  Intra-op Plan:   Post-operative Plan: Extubation in OR  Informed Consent: I have reviewed the patients History and Physical, chart, labs and discussed the procedure including the risks, benefits and alternatives for the proposed anesthesia with the patient or authorized representative who has indicated his/her understanding and acceptance.   Dental advisory given  Plan Discussed with: CRNA  Anesthesia Plan Comments:         Anesthesia Quick Evaluation

## 2017-12-08 NOTE — H&P (Signed)
Renee Harris Location: [facility] Patient #: [id number] DOB: [birth year] Married / Language: English / Race: White Female   History of Present Illness   The patient is a 57 year old female who presents with a complaint of Right breast cancer.  The PCP is D. Eddie Candle  The patient was referred by Dr. Everlean Alstrom  The pateint is at the Breast [facility] - Oncology is Drs. Gudena and [place] Husband, Richard, with her, and her sister, Jeannene Patella.  I placed a port for chemotx in her on She is up for her next breast MRI on 11/11/2017. She has completed neoadjuvant chemotx of TC. She did require a blood transfusion on 11/02/2017 for a Hgb of 7.5. Her last Hgb is 9.9 on 11/03/2017. She last saw Mike Craze, NP, at [facility] on 10/30/2017. We talked about the options for treatment of lumpectomy versus mastectomy. I think she is a candidate for lumpectomy. We'll await the final MRI results. Because of her positive lymph node, she'll need a targeted right axillary lymph node dissection. I gave her literature on breast surgery. We also talked about genetics, but she is not at high risk for genetically transmitted breast cancer.  History of right breast cancer: The patient had never had a mammogram. She went to the breast center on 03/23/2017 at the insistence of her primary care doctor to get a mammogram. Her mammogram showed in the right breast a mass and an area of calcifications that was suspicious. [facility] and was found to have 2 areas that were suspicious in the right breast on mammogram. She has no family history of breast cancer. She is not on hormone replacement therapy.  Mammograms: Mammogram at [place] (04/07/2017) - 1. Highly suspicious right breast mass with associated calcifications measuring 2.1 cm. 2. Additional indeterminate calcifications in the retroareolar right breast.  (on the final clip films, these areas were much further apart than at [place] presentation) Biopsy: 04/13/2017 ([id number]) - UIQ mass at 1 o'clock - IDC, grade 3, ER - 100%, PR - 70%, Ki67 - 60%, Her2Neu - POSITIVE .Marland KitchenMarland Kitchen UOQ Ca++ - fibroadenoma Family history of breast or ovarian cancer: No On hormone therapy: No  I discussed the options for breast cancer treatment with the patient. The patient is at the [facility], which includes medical oncology and radiation oncology. I discussed the surgical options of lumpectomy vs. mastectomy. If mastectomy, there is the possibility of reconstruction. I discussed the options of lymph node biopsy. The treatment plan depends on the pathologic staging of the tumor and the patient's personal wishes. The risks of surgery include, but are not limited to, bleeding, infection, the need for further surgery, and nerve injury. The patient has been given literature on the treatment of breast cancer.  Plan: 1) MRI of breasts 2) right breast lumpectomy (seed loc), targeted right axillary lymph node dissection 3) Herceptin/Perjeta tx for Her2Neu, 4) radiation therapy  Past Medical History: 1. Right breast cancer Biopsy: 04/13/2017 ([id number]) - UIQ mass at 1 o'clock - IDC, grade 3, ER - 100%, PR - 70%, Ki67 - 60%, Her2Neu - POSITIVE .Marland KitchenMarland Kitchen UOQ Ca++ - fibroadenoma Her right axillary lymph node biopsy is positive for cancer (05/13/2017) 2. HTN - for 15 years 3. Asthma - seasonal - mainly winter 4. History of kidney stones Sees Dr. Leo Grosser in [place] 5. Lap chole Deneise Lever [place] - 05/2011 6. Never had colonoscopy - we talked about this  7. "wall eyes" - exotropia 8. Heart murmur - 3/6 remote echo 9. History of ocular myasthenia gravis She took steroids for a long time for this, but stopped about 20 years ago. 10. Hysterectomy in 2004 for fibroids 11. Left subclavian power  port - 05/25/2017 - D. Lucia Gaskins  Social History: Husband, Richard, with her, and her sister, Pam. No children. She keeps her neice's kids. No other job.  Addendum Note(Marycatherine Maniscalco H. Jacquetta Polhamus MD; 11/06/2017 5:29 PM)  Her husband has hurt his back and he has been out of work for 3 weeks and looks like he will be out of work for at least 3 more weeks.   Allergies (April Staton, [place]; 11/06/2017 3:33 PM) Latex Exam Gloves *MEDICAL DEVICES AND SUPPLIES*  Statins  Pyridostigmine Bromide *ANTIMYASTHENIC/CHOLINERGIC AGENTS*   Medication History (April Staton, CMA; 11/06/2017 3:34 PM) Allopurinol (300MG Tablet, Oral) Active. Escitalopram Oxalate (20MG Tablet, Oral) Active. Fenofibrate (160MG Tablet, Oral) Active. HydroCHLOROthiazide (12.5MG Capsule, Oral) Active. Losartan Potassium (50MG Tablet, Oral) Active. MetFORMIN HCl (500MG Tablet, Oral) Active. Aspirin (81MG Tablet DR, Oral) Active. Albuterol (90MCG/ACT Aerosol Soln, Inhalation) Active. Cholecalciferol (1000UNIT Capsule, Oral) Active. Omega 3 (1000MG Capsule, Oral) Active. Omeprazole (40MG Capsule DR, Oral) Active. Medications Reconciled  Vitals (April Staton CMA; 11/06/2017 3:34 PM) 11/06/2017 3:34 PM Weight: 249.38 lb Height: 62in Body Surface Area: 2.1 m Body Mass Index: 45.61 kg/m  Temp.: 98.61F(Oral)  Pulse: 73 (Regular)  BP: 120/82 (Sitting, Left Arm, Standard)    Physical Exam  General: WN obese WF alert and generally healthy appearing. Skin: Inspection and palpation of the skin unremarkable.  Eyes: Conjunctivae white, Has "wall eye" (exotropia) - she said she had surgery when she was young, but it did not work Face, ears, nose, mouth, and throat: Face - normal. Normal ears and nose. Lips and teeth normal.  Neck: Supple. No mass. Trachea midline. No thyroid mass.  Lymph Nodes: No supraclavicular or cervical adenopathy. No axillary adenopathy.  Lungs: Normal respiratory effort.  Clear to auscultation and symmetric breath sounds. Cardiovascular: Regular rate and rythm. Normal auscultation of the heart. Has 3/6 murmur.  Breasts: Right - Bruise along lateral right nipple. She has a mass effect above the nipple - tumor vs hematoma. No skin involvement. No nipple discharge. Left - No mass or nodule.  Abdomen: Soft. No mass. Liver and spleen not palpable. No tenderness. No hernia. Normal bowel sounds.  Lower midline scar from hysterectomy. Pannus with skin breakdown below the pannus. Rectal: Not done.  Musculoskeletal/extremities: Normal gait. Good strength and ROM in upper and lower extremities.   Neurologic: Grossly intact to motor and sensory function.   Psychiatric: Has normal mood and affect. Judgement and insight appear normal.   Assessment & Plan  1.  MALIGNANT NEOPLASM OF RIGHT BREAST, STAGE 1, ESTROGEN RECEPTOR POSITIVE (C50.911)  Story: Mammogram at [place] (04/07/2017) - 1. Highly suspicious right breast mass with associated calcifications measuring 2.1 cm. 2. Additional indeterminate calcifications in the retroareolar right breast. (on the final clip films, these areas were much further apart than at [place] presentation)  Biopsy: 04/13/2017 ([id number]) - UIQ mass at 1 o'clock - IDC, grade 3, ER - 100%, PR - 70%, Ki67 - 60%, Her2Neu - POSITIVE .Marland KitchenMarland Kitchen UOQ Ca++ - fibroadenoma   Oncology - Gudena and [place]   1)   Her MRI of her breast (11/11/2017) - showed 1. Known malignancy in the upper portion of the right breast is slightly larger compared with the mid treatment exam and  slightly smaller compared with the initial exam.    2. Enlarged right axillary lymph node appears stable since mid treatment exam and smaller since the initial exam.    2) Right breast lumpectomy (seed loc), right axillary targeted lymph node dissection (seed localization)   3) Continue Herceptin/Perjeta for Her2Neu,   4) Radiation  therapy  2.  Papilloma of right breast  Addendum Note(Baruch Lewers H. Lucia Gaskins MD; 12/07/2017 7:38 AM)  Ellis Parents sent me a note about a 3rd seed. She had a retroareolar papilloma.  Plan excision of papilloma at the same time as the lumpectomy  3. HTN - for 15 years 4. Asthma - seasonal - mainly winter 5. History of kidney stones Sees Dr. Leo Grosser in [place] 6. Never had colonoscopy - we talked about this 7. "wall eyes" - exotropia 8. Heart murmur - 3/6  remote echo 9. History of ocular myasthenia gravis  She took steroids for a long time for this, but stopped about 20 years ago. 10.  Thrombocytopenia  Plt - 118,000 - 12/03/2017   Alphonsa Overall, MD, [facility] Surgery Pager: [phone] Office phone:  [phone]

## 2017-12-09 ENCOUNTER — Encounter (HOSPITAL_COMMUNITY)
Admission: RE | Admit: 2017-12-09 | Discharge: 2017-12-09 | Disposition: A | Discharge status: Home / Self Care | Payer: 59 | Source: Ambulatory Visit | Attending: Surgery | Admitting: Surgery

## 2017-12-09 ENCOUNTER — Encounter (HOSPITAL_COMMUNITY): Payer: Self-pay | Admitting: *Deleted

## 2017-12-09 ENCOUNTER — Ambulatory Visit (HOSPITAL_COMMUNITY): Payer: 59 | Admitting: Anesthesiology

## 2017-12-09 ENCOUNTER — Ambulatory Visit (HOSPITAL_COMMUNITY)
Admission: RE | Admit: 2017-12-09 | Discharge: 2017-12-09 | Disposition: A | Discharge status: Home / Self Care | Payer: 59 | Source: Ambulatory Visit | Attending: Surgery | Admitting: Surgery

## 2017-12-09 ENCOUNTER — Other Ambulatory Visit: Payer: Self-pay

## 2017-12-09 ENCOUNTER — Encounter (HOSPITAL_COMMUNITY)
Admission: RE | Disposition: A | Discharge status: Home / Self Care | Payer: Self-pay | Source: Ambulatory Visit | Attending: Surgery

## 2017-12-09 ENCOUNTER — Ambulatory Visit
Admission: RE | Admit: 2017-12-09 | Discharge: 2017-12-09 | Disposition: A | Discharge status: Home / Self Care | Payer: 59 | Source: Ambulatory Visit | Attending: Surgery | Admitting: Surgery

## 2017-12-09 DIAGNOSIS — Z7984 Long term (current) use of oral hypoglycemic drugs: Secondary | ICD-10-CM | POA: Insufficient documentation

## 2017-12-09 DIAGNOSIS — Z7982 Long term (current) use of aspirin: Secondary | ICD-10-CM | POA: Insufficient documentation

## 2017-12-09 DIAGNOSIS — Z170 Estrogen receptor positive status [ER+]: Secondary | ICD-10-CM | POA: Diagnosis not present

## 2017-12-09 DIAGNOSIS — Z87442 Personal history of urinary calculi: Secondary | ICD-10-CM | POA: Diagnosis not present

## 2017-12-09 DIAGNOSIS — D696 Thrombocytopenia, unspecified: Secondary | ICD-10-CM | POA: Insufficient documentation

## 2017-12-09 DIAGNOSIS — C50911 Malignant neoplasm of unspecified site of right female breast: Secondary | ICD-10-CM | POA: Diagnosis present

## 2017-12-09 DIAGNOSIS — J45909 Unspecified asthma, uncomplicated: Secondary | ICD-10-CM | POA: Diagnosis not present

## 2017-12-09 DIAGNOSIS — Z90710 Acquired absence of both cervix and uterus: Secondary | ICD-10-CM | POA: Insufficient documentation

## 2017-12-09 DIAGNOSIS — Z6841 Body Mass Index (BMI) 40.0 and over, adult: Secondary | ICD-10-CM | POA: Diagnosis not present

## 2017-12-09 DIAGNOSIS — K219 Gastro-esophageal reflux disease without esophagitis: Secondary | ICD-10-CM | POA: Insufficient documentation

## 2017-12-09 DIAGNOSIS — C773 Secondary and unspecified malignant neoplasm of axilla and upper limb lymph nodes: Secondary | ICD-10-CM | POA: Insufficient documentation

## 2017-12-09 DIAGNOSIS — C50811 Malignant neoplasm of overlapping sites of right female breast: Secondary | ICD-10-CM | POA: Diagnosis not present

## 2017-12-09 DIAGNOSIS — H5010 Unspecified exotropia: Secondary | ICD-10-CM | POA: Diagnosis not present

## 2017-12-09 DIAGNOSIS — G7000 Myasthenia gravis without (acute) exacerbation: Secondary | ICD-10-CM | POA: Diagnosis not present

## 2017-12-09 DIAGNOSIS — D241 Benign neoplasm of right breast: Secondary | ICD-10-CM | POA: Insufficient documentation

## 2017-12-09 DIAGNOSIS — E119 Type 2 diabetes mellitus without complications: Secondary | ICD-10-CM | POA: Insufficient documentation

## 2017-12-09 DIAGNOSIS — I10 Essential (primary) hypertension: Secondary | ICD-10-CM | POA: Insufficient documentation

## 2017-12-09 DIAGNOSIS — Z79899 Other long term (current) drug therapy: Secondary | ICD-10-CM | POA: Diagnosis not present

## 2017-12-09 DIAGNOSIS — N6011 Diffuse cystic mastopathy of right breast: Secondary | ICD-10-CM | POA: Diagnosis not present

## 2017-12-09 HISTORY — PX: BREAST LUMPECTOMY WITH RADIOACTIVE SEED AND SENTINEL LYMPH NODE BIOPSY: SHX6550

## 2017-12-09 LAB — GLUCOSE, CAPILLARY
Glucose-Capillary: 245 mg/dL — ABNORMAL HIGH (ref 65–99)
Glucose-Capillary: 147 mg/dL — ABNORMAL HIGH (ref 65–99)

## 2017-12-09 SURGERY — BREAST LUMPECTOMY WITH RADIOACTIVE SEED AND SENTINEL LYMPH NODE BIOPSY
Anesthesia: General | Site: Breast | Laterality: Right

## 2017-12-09 MED ORDER — 0.9 % SODIUM CHLORIDE (POUR BTL) OPTIME
TOPICAL | Status: DC | PRN
  Administered 2017-12-09: 1000 mL

## 2017-12-09 MED ORDER — MIDAZOLAM HCL 2 MG/2ML IJ SOLN
2.0000 mg | Freq: Once | INTRAMUSCULAR | Status: AC
  Administered 2017-12-09: 2 mg via INTRAVENOUS

## 2017-12-09 MED ORDER — ACETAMINOPHEN 500 MG PO TABS
1000.0000 mg | ORAL_TABLET | ORAL | Status: AC
  Administered 2017-12-09: 1000 mg via ORAL
  Filled 2017-12-09: qty 2

## 2017-12-09 MED ORDER — PROPOFOL 10 MG/ML IV BOLUS
INTRAVENOUS | Status: DC | PRN
  Administered 2017-12-09: 150 mg via INTRAVENOUS

## 2017-12-09 MED ORDER — PROMETHAZINE HCL 25 MG/ML IJ SOLN: 6.2500 mg | INTRAMUSCULAR | Status: DC | PRN

## 2017-12-09 MED ORDER — OXYCODONE HCL 5 MG PO TABS
5.0000 mg | ORAL_TABLET | Freq: Once | ORAL | Status: AC | PRN
  Administered 2017-12-09: 5 mg via ORAL

## 2017-12-09 MED ORDER — CHLORHEXIDINE GLUCONATE CLOTH 2 % EX PADS: 6.0000 | MEDICATED_PAD | Freq: Once | CUTANEOUS | Status: DC

## 2017-12-09 MED ORDER — EPHEDRINE 5 MG/ML INJ
INTRAVENOUS | Status: AC
  Filled 2017-12-09: qty 10

## 2017-12-09 MED ORDER — PHENYLEPHRINE 40 MCG/ML (10ML) SYRINGE FOR IV PUSH (FOR BLOOD PRESSURE SUPPORT)
PREFILLED_SYRINGE | INTRAVENOUS | Status: DC | PRN
  Administered 2017-12-09: 160 ug via INTRAVENOUS
  Administered 2017-12-09: 120 ug via INTRAVENOUS
  Administered 2017-12-09: 80 ug via INTRAVENOUS
  Administered 2017-12-09: 120 ug via INTRAVENOUS
  Administered 2017-12-09: 80 ug via INTRAVENOUS
  Administered 2017-12-09 (×2): 120 ug via INTRAVENOUS
  Administered 2017-12-09 (×2): 80 ug via INTRAVENOUS
  Administered 2017-12-09 (×7): 120 ug via INTRAVENOUS

## 2017-12-09 MED ORDER — OXYCODONE HCL 5 MG PO TABS
ORAL_TABLET | ORAL | Status: DC
Start: 2017-12-09 — End: 2017-12-09
  Filled 2017-12-09: qty 1

## 2017-12-09 MED ORDER — FENTANYL CITRATE (PF) 100 MCG/2ML IJ SOLN
INTRAMUSCULAR | Status: DC | PRN
  Administered 2017-12-09: 100 ug via INTRAVENOUS
  Administered 2017-12-09: 50 ug via INTRAVENOUS

## 2017-12-09 MED ORDER — FENTANYL CITRATE (PF) 250 MCG/5ML IJ SOLN
INTRAMUSCULAR | Status: AC
  Filled 2017-12-09: qty 5

## 2017-12-09 MED ORDER — FENTANYL CITRATE (PF) 100 MCG/2ML IJ SOLN: 25.0000 ug | INTRAMUSCULAR | Status: DC | PRN

## 2017-12-09 MED ORDER — ROCURONIUM BROMIDE 10 MG/ML (PF) SYRINGE
PREFILLED_SYRINGE | INTRAVENOUS | Status: AC
  Filled 2017-12-09: qty 5

## 2017-12-09 MED ORDER — SODIUM CHLORIDE 0.9 % IJ SOLN
INTRAVENOUS | Status: DC | PRN
  Administered 2017-12-09: 09:00:00

## 2017-12-09 MED ORDER — MIDAZOLAM HCL 2 MG/2ML IJ SOLN
INTRAMUSCULAR | Status: AC
  Filled 2017-12-09: qty 2

## 2017-12-09 MED ORDER — LACTATED RINGERS IV SOLN
INTRAVENOUS | Status: DC
  Administered 2017-12-09 (×3): via INTRAVENOUS

## 2017-12-09 MED ORDER — LIDOCAINE 2% (20 MG/ML) 5 ML SYRINGE
INTRAMUSCULAR | Status: AC
  Filled 2017-12-09: qty 5

## 2017-12-09 MED ORDER — ARTIFICIAL TEARS OPHTHALMIC OINT
TOPICAL_OINTMENT | OPHTHALMIC | Status: AC
  Filled 2017-12-09: qty 3.5

## 2017-12-09 MED ORDER — EPHEDRINE SULFATE 50 MG/ML IJ SOLN
INTRAMUSCULAR | Status: DC | PRN
  Administered 2017-12-09 (×2): 5 mg via INTRAVENOUS
  Administered 2017-12-09: 15 mg via INTRAVENOUS
  Administered 2017-12-09: 10 mg via INTRAVENOUS
  Administered 2017-12-09: 5 mg via INTRAVENOUS
  Administered 2017-12-09: 10 mg via INTRAVENOUS

## 2017-12-09 MED ORDER — FENTANYL CITRATE (PF) 100 MCG/2ML IJ SOLN
50.0000 ug | Freq: Once | INTRAMUSCULAR | Status: AC
  Administered 2017-12-09: 50 ug via INTRAVENOUS

## 2017-12-09 MED ORDER — PROPOFOL 10 MG/ML IV BOLUS
INTRAVENOUS | Status: AC
  Filled 2017-12-09: qty 40

## 2017-12-09 MED ORDER — BUPIVACAINE-EPINEPHRINE (PF) 0.5% -1:200000 IJ SOLN
INTRAMUSCULAR | Status: AC
  Filled 2017-12-09: qty 30

## 2017-12-09 MED ORDER — ARTIFICIAL TEARS OPHTHALMIC OINT
TOPICAL_OINTMENT | OPHTHALMIC | Status: DC | PRN
  Administered 2017-12-09: 1 via OPHTHALMIC

## 2017-12-09 MED ORDER — PHENYLEPHRINE 40 MCG/ML (10ML) SYRINGE FOR IV PUSH (FOR BLOOD PRESSURE SUPPORT)
PREFILLED_SYRINGE | INTRAVENOUS | Status: AC
  Filled 2017-12-09: qty 10

## 2017-12-09 MED ORDER — LIDOCAINE HCL (CARDIAC) 20 MG/ML IV SOLN
INTRAVENOUS | Status: DC | PRN
  Administered 2017-12-09: 60 mg via INTRAVENOUS

## 2017-12-09 MED ORDER — BUPIVACAINE-EPINEPHRINE (PF) 0.5% -1:200000 IJ SOLN
INTRAMUSCULAR | Status: DC | PRN
  Administered 2017-12-09: 30 mL

## 2017-12-09 MED ORDER — HYDROCODONE-ACETAMINOPHEN 5-325 MG PO TABS: 1.0000 | ORAL_TABLET | Freq: Four times a day (QID) | ORAL | 0 refills | Status: DC | PRN

## 2017-12-09 MED ORDER — ONDANSETRON HCL 4 MG/2ML IJ SOLN
INTRAMUSCULAR | Status: DC | PRN
  Administered 2017-12-09: 4 mg via INTRAVENOUS

## 2017-12-09 MED ORDER — OXYCODONE HCL 5 MG/5ML PO SOLN: 5.0000 mg | Freq: Once | ORAL | Status: AC | PRN

## 2017-12-09 MED ORDER — BUPIVACAINE-EPINEPHRINE 0.5% -1:200000 IJ SOLN
INTRAMUSCULAR | Status: DC | PRN
  Administered 2017-12-09: 20 mL

## 2017-12-09 MED ORDER — METHYLENE BLUE 0.5 % INJ SOLN
INTRAVENOUS | Status: AC
  Filled 2017-12-09: qty 10

## 2017-12-09 MED ORDER — ONDANSETRON HCL 4 MG/2ML IJ SOLN
INTRAMUSCULAR | Status: AC
  Filled 2017-12-09: qty 2

## 2017-12-09 MED ORDER — FENTANYL CITRATE (PF) 100 MCG/2ML IJ SOLN
INTRAMUSCULAR | Status: AC
  Administered 2017-12-09: 50 ug via INTRAVENOUS
  Filled 2017-12-09: qty 2

## 2017-12-09 MED ORDER — CEFAZOLIN SODIUM-DEXTROSE 2-4 GM/100ML-% IV SOLN
2.0000 g | INTRAVENOUS | Status: DC
  Filled 2017-12-09: qty 100

## 2017-12-09 MED ORDER — TECHNETIUM TC 99M SULFUR COLLOID FILTERED
1.0000 | Freq: Once | INTRAVENOUS | Status: AC | PRN
  Administered 2017-12-09: 1 via INTRADERMAL

## 2017-12-09 MED ORDER — GABAPENTIN 300 MG PO CAPS
300.0000 mg | ORAL_CAPSULE | ORAL | Status: AC
  Administered 2017-12-09: 300 mg via ORAL
  Filled 2017-12-09: qty 1

## 2017-12-09 MED ORDER — MIDAZOLAM HCL 2 MG/2ML IJ SOLN
INTRAMUSCULAR | Status: AC
  Administered 2017-12-09: 2 mg via INTRAVENOUS
  Filled 2017-12-09: qty 2

## 2017-12-09 SURGICAL SUPPLY — 59 items
ADH SKN CLS APL DERMABOND .7 (GAUZE/BANDAGES/DRESSINGS) ×2
APPLIER CLIP 9.375 MED OPEN (MISCELLANEOUS) ×3
APR CLP MED 9.3 20 MLT OPN (MISCELLANEOUS) ×1
BINDER BREAST LRG (GAUZE/BANDAGES/DRESSINGS) IMPLANT
BINDER BREAST XLRG (GAUZE/BANDAGES/DRESSINGS) ×2 IMPLANT
BIOPATCH BLUE 3/4IN DISK W/1.5 (GAUZE/BANDAGES/DRESSINGS) ×2 IMPLANT
BLADE SURG 15 STRL LF DISP TIS (BLADE) ×2 IMPLANT
BLADE SURG 15 STRL SS (BLADE) ×6
CANISTER SUCT 3000ML PPV (MISCELLANEOUS) ×3 IMPLANT
CHLORAPREP W/TINT 26ML (MISCELLANEOUS) ×3 IMPLANT
CLIP APPLIE 9.375 MED OPEN (MISCELLANEOUS) IMPLANT
CONT SPEC 4OZ CLIKSEAL STRL BL (MISCELLANEOUS) ×10 IMPLANT
COVER PROBE W GEL 5X96 (DRAPES) ×3 IMPLANT
COVER SURGICAL LIGHT HANDLE (MISCELLANEOUS) ×3 IMPLANT
DERMABOND ADVANCED (GAUZE/BANDAGES/DRESSINGS) ×4
DERMABOND ADVANCED .7 DNX12 (GAUZE/BANDAGES/DRESSINGS) ×1 IMPLANT
DEVICE DUBIN SPECIMEN MAMMOGRA (MISCELLANEOUS) ×3 IMPLANT
DRAIN CHANNEL 19F RND (DRAIN) ×2 IMPLANT
DRAPE CHEST BREAST 15X10 FENES (DRAPES) ×3 IMPLANT
DRAPE UTILITY XL STRL (DRAPES) ×3 IMPLANT
DRSG PAD ABDOMINAL 8X10 ST (GAUZE/BANDAGES/DRESSINGS) ×4 IMPLANT
DRSG TEGADERM 4X4.75 (GAUZE/BANDAGES/DRESSINGS) ×2 IMPLANT
ELECT COATED BLADE 2.86 ST (ELECTRODE) ×3 IMPLANT
ELECT REM PT RETURN 9FT ADLT (ELECTROSURGICAL) ×3
ELECTRODE REM PT RTRN 9FT ADLT (ELECTROSURGICAL) ×1 IMPLANT
EVACUATOR SILICONE 100CC (DRAIN) ×2 IMPLANT
GAUZE SPONGE 4X4 12PLY STRL (GAUZE/BANDAGES/DRESSINGS) ×5 IMPLANT
GLOVE BIOGEL PI IND STRL 7.5 (GLOVE) IMPLANT
GLOVE BIOGEL PI INDICATOR 7.5 (GLOVE) ×8
GLOVE SURG SIGNA 7.5 PF LTX (GLOVE) ×6 IMPLANT
GOWN STRL REUS W/ TWL LRG LVL3 (GOWN DISPOSABLE) ×1 IMPLANT
GOWN STRL REUS W/ TWL XL LVL3 (GOWN DISPOSABLE) ×1 IMPLANT
GOWN STRL REUS W/TWL LRG LVL3 (GOWN DISPOSABLE) ×3
GOWN STRL REUS W/TWL XL LVL3 (GOWN DISPOSABLE) ×3
ILLUMINATOR WAVEGUIDE N/F (MISCELLANEOUS) IMPLANT
KIT BASIN OR (CUSTOM PROCEDURE TRAY) ×3 IMPLANT
KIT MARKER MARGIN INK (KITS) ×3 IMPLANT
LIGHT WAVEGUIDE WIDE FLAT (MISCELLANEOUS) IMPLANT
NDL FILTER BLUNT 18X1 1/2 (NEEDLE) IMPLANT
NDL HYPO 25GX1X1/2 BEV (NEEDLE) ×1 IMPLANT
NDL SAFETY ECLIPSE 18X1.5 (NEEDLE) IMPLANT
NEEDLE FILTER BLUNT 18X 1/2SAF (NEEDLE)
NEEDLE FILTER BLUNT 18X1 1/2 (NEEDLE) IMPLANT
NEEDLE HYPO 18GX1.5 SHARP (NEEDLE)
NEEDLE HYPO 25GX1X1/2 BEV (NEEDLE) ×3 IMPLANT
NS IRRIG 1000ML POUR BTL (IV SOLUTION) ×3 IMPLANT
PACK SURGICAL SETUP 50X90 (CUSTOM PROCEDURE TRAY) ×3 IMPLANT
PENCIL BUTTON HOLSTER BLD 10FT (ELECTRODE) ×3 IMPLANT
SPONGE LAP 18X18 X RAY DECT (DISPOSABLE) ×7 IMPLANT
SUT ETHILON 2 0 FS 18 (SUTURE) ×2 IMPLANT
SUT MNCRL AB 4-0 PS2 18 (SUTURE) ×5 IMPLANT
SUT VIC AB 3-0 SH 8-18 (SUTURE) ×5 IMPLANT
SYR BULB 3OZ (MISCELLANEOUS) ×3 IMPLANT
SYR CONTROL 10ML LL (SYRINGE) ×3 IMPLANT
TOWEL OR 17X24 6PK STRL BLUE (TOWEL DISPOSABLE) ×3 IMPLANT
TOWEL OR 17X26 10 PK STRL BLUE (TOWEL DISPOSABLE) ×3 IMPLANT
TUBE CONNECTING 12'X1/4 (SUCTIONS) ×1
TUBE CONNECTING 12X1/4 (SUCTIONS) ×2 IMPLANT
YANKAUER SUCT BULB TIP NO VENT (SUCTIONS) ×3 IMPLANT

## 2017-12-09 NOTE — Anesthesia Procedure Notes (Signed)
Anesthesia Regional Block: Pectoralis block   Pre-Anesthetic Checklist: ,, timeout performed, Correct Patient, Correct Site, Correct Laterality, Correct Procedure, Correct Position, site marked, Risks and benefits discussed,  Surgical consent,  Pre-op evaluation,  At surgeon's request and post-op pain management  Laterality: Right  Prep: chloraprep       Needles:  Injection technique: Single-shot  Needle Type: Echogenic Needle     Needle Length: 10cm  Needle Gauge: 21     Additional Needles:   Narrative:  Start time: 12/09/2017 7:53 AM End time: 12/09/2017 7:57 AM Injection made incrementally with aspirations every 5 mL.  Performed by: Personally  Anesthesiologist: Audry Pili, MD  Additional Notes: No pain on injection. No increased resistance to injection. Injection made in 5cc increments. Good needle visualization. Patient tolerated the procedure well.

## 2017-12-09 NOTE — Anesthesia Postprocedure Evaluation (Signed)
Anesthesia Post Note  Patient: Renee Harris  Procedure(s) Performed: RIGHT BREAST LUMPECTOMY WITH RADIOACTIVE SEED AND SENTINEL LYMPH NODE BIOPSY AND RADIOACTIVE SEED TARGETED RIGHT AXILLARY LYMPH NODE EXCISION (Right Breast)     Patient location during evaluation: PACU Anesthesia Type: General Level of consciousness: awake and alert Pain management: pain level controlled Vital Signs Assessment: post-procedure vital signs reviewed and stable Respiratory status: spontaneous breathing, nonlabored ventilation and respiratory function stable Cardiovascular status: blood pressure returned to baseline and stable Postop Assessment: no apparent nausea or vomiting Anesthetic complications: no    Last Vitals:  Vitals:   12/09/17 1130 12/09/17 1146  BP: 100/60 125/68  Pulse: 86 76  Resp: 13   Temp: 36.8 C 36.8 C  SpO2: 96% 97%    Last Pain:  Vitals:   12/09/17 1209  TempSrc:   PainSc: [name]

## 2017-12-09 NOTE — Op Note (Signed)
12/09/2017  10:48 AM  PATIENT:  Renee Harris DOB: [birth year] MRN: [id number]  PREOP DIAGNOSIS:   RIGHT BREAST CANCER  POSTOP DIAGNOSIS:    Right breast cancer, 12 o'clock position (T2, N1), right breast papilloma  PROCEDURE:   Procedure(s):   RIGHT BREAST LUMPECTOMY WITH RADIOACTIVE SEED (seed #1) AND SENTINEL LYMPH NODE BIOPSY AND RADIOACTIVE SEED (seed #2) TARGETED RIGHT AXILLARY LYMPH NODE EXCISION, Injection of peri areolar area of breast with methylene blue (1.0 cc), deep sentinel lymph node biopsy Right breast biopsy (2nd) seed localized (seed #3) for right breast papilloma   SURGEON:   Alphonsa Overall, M.D.  ANESTHESIA:   general  Anesthesiologist: Audry Pili, MD CRNA: Leonor Liv, CRNA  General  EBL:  125  ml  DRAINS:  none   LOCAL MEDICATIONS USED:   20 cc of 1/2% marcaine, right pectoral block  SPECIMEN: (8 specimens)  Right subareolar biopsy (painted x 6), right subnipple biopsy, Right breast lumpectomy (painted x 6) at 12 o'clock, Superior margin (painted x 2), medial margin (painted x 2), right axillary targeted node (with seed), right axillary sentinel lymph node (counts 145, background - 0, not blue), additional abnormal right axillary lymph node  COUNTS CORRECT:  YES  INDICATIONS FOR PROCEDURE:  Renee Harris is a 57 y.o. (DOB: 07/18/1961) white female whose primary care physician is Sharilyn Sites, MD and comes for right breast lumpectomy and right axillary sentinel lymph node biopsy and right axillary targeted lymph node biopsy.  She will also have a papilloma removed from under her right nipple and this will be excised at the same time.   She was originally seen in the the Breast [facility] with Drs. [place] and [place].  She completed neoadjuvant chemotx for a right breast IDC and a positive right axillary lymph node supervised by Dr. Lindi Adie.   The options for breast cancer treatment have been discussed with the patient. She elected to proceed with lumpectomy  and axillary sentinel lymph node.     The indications and potential complications of surgery were explained to the patient. Potential complications include, but are not limited to, bleeding, infection, the need for further surgery, and nerve injury.     She had three I131 seeds placed on 12/07/2017 in her right breast at [facility].  A seed (#1)was placed in the 12 o'clock position of the right breast for the right breast cancer, a seed subareolar (#2) for the papilloma and a seed (#3) in the right axilla for the positive a.   In the holding area, her right areola was injected with 1 millicurie of Technitium Sulfur Colloid.  OPERATIVE NOTE:   The patient was taken to operating room # 2 at [facility] where she underwent a general anesthesia  supervised by Anesthesiologist: Audry Pili, MD CRNA: Leonor Liv, CRNA. Her right breast and axilla were prepped with  ChloraPrep and sterilely draped.    A time-out and the surgical check list was reviewed.    I injected about 0.5 mL of 40% methylene blue around her right areola.   I first excised the benign lesion in the retroareolar position.  I used the Neoprobe to identify the I131 seed.  I made a lateral circumareolar incision.  I excised a block of tissue 3 x 3 cm underneath the nipple.  I painted the specimen with the 6 color paint kit.  I did a specimen mammogram.  The seed and one of two clips were in the specimen.  I did a sub nipple excision of the remaining tissue.  The second clip was in the skin of the areola and removed with the second specimen.  A specimen mammogram was shot.   I turned attention to the cancer which was about at the 12 o'clock position of the right breast.   I used the Neoprobe to identify the I131 seed.  I tried to excise an area around the tumor of at least 1 cm.    I excised this block of breast tissue approximately 4 cm by 4 cm  in diameter.   I painted the lumpectomy specimen with the 6 color paint kit  and did a specimen mammogram which confirmed the mass, clip, and the seed were all in the right position in the specimen.  The specimen was sent to pathology who called back to confirm that they have the seed and the specimen.   The specimen mammogram was close to the superior and medial margins.  So I excised additional superior and medial margins of the lumpectomy cavity.  I painted each additional excision with 2 color paints.   I then started the right deep axillary sentinel lymph node biopsy. I made an incision in the right axilla.  I first found the targeted right axillary lymph node using the Neoprobe to identify the I131 seed.  The node identified was grossly abnormal.  I did a specimen mammogram that identified the seed and the prior hydromark clip.   I then went to find the sentinel lymph node.  I went through just one incision to excise all the axillary lymph nodes.   I found a hot area at the junction of the breast and the pectoralis major muscle, deep in the axilla. I  identified a hot node that had counts of 145 and the background has 0 counts. The lymph node was not blue.  This was sent as the right axillary sentinel lymph node.   I felt at least one other node that was worrisome for being positive.  So I removed this lymph node and labeled it as additional axillary lymph node.  Because of the amount of tissue that I excised from the right axilla, I placed a 19 French drain in the axilla.  I sewed the drain in place with a 2-0 nylon suture.   I then irrigated the wounds with saline. I infiltrated approximately 20 mL of 1/4% Marcaine between the incisions. I placed 4 clips to mark the cancer biopsy cavity, at 12, 3, 6, and 9 o'clock.  I then closed all the wounds in layers using 3-0 Vicryl sutures for the deep layer. At the skin, I closed the incisions with a 4-0 Monocryl suture. The incisions were then painted with Dermabond.  She had gauze place over the wounds and placed in a breast  binder.   The patient tolerated the procedure well, was transported to the recovery room in good condition. Sponge and needle count were correct at the end of the case.   Final pathology is pending.   Alphonsa Overall, MD, [facility] Surgery Pager: [phone] Office phone:  [phone]

## 2017-12-09 NOTE — Discharge Instructions (Signed)
[facility] - DISCHARGE INSTRUCTIONS TO PATIENT  Activity:  Driving - May drive in 2 or 3 days, if off pain meds   Lifting - No lifting more than 15 pounds for 7 days.  Wound Care:   Leave bandage on for 3 days, then you may remove the bandage and shower.          Empty drain twice a day, record the amount of the drainage.  Bring that record with you to the office.  Diet:  As tolerated.  Follow up appointment:  Call Dr. Pollie Friar office [facility] Surgery) at [phone] for an appointment in 1 week.  Medications and dosages:  Resume your home medications.  You have a prescription for:  Vicodin  Call Dr. Lucia Gaskins or his office  [phone]) if you have:  Temperature greater than 100.4,  Persistent nausea and vomiting,  Severe uncontrolled pain,  Redness, tenderness, or signs of infection (pain, swelling, redness, odor or green/yellow discharge around the site),  Difficulty breathing, headache or visual disturbances,  Any other questions or concerns you may have after discharge.  In an emergency, call 911 or go to an Emergency Department at a nearby hospital.

## 2017-12-09 NOTE — Interval H&P Note (Signed)
History and Physical Interval Note:  12/09/2017 8:20 AM  Renee Harris  has presented today for surgery, with the diagnosis of RIGHT BREAST CANCER  The various methods of treatment have been discussed with the patient and family.  Her husband is here.  After consideration of risks, benefits and other options for treatment, the patient has consented to  Procedure(s): RIGHT BREAST LUMPECTOMY WITH RADIOACTIVE SEED AND SENTINEL LYMPH NODE BIOPSY AND RADIOACTIVE SEED TARGETED RIGHT AXILLARY LYMPH NODE EXCISION (Right) as a surgical intervention .  The patient's history has been reviewed, patient examined, no change in status, stable for surgery.  I have reviewed the patient's chart and labs.  Questions were answered to the patient's satisfaction.     Shann Medal

## 2017-12-09 NOTE — Transfer of Care (Signed)
Immediate Anesthesia Transfer of Care Note  Patient: Renee Harris  Procedure(s) Performed: RIGHT BREAST LUMPECTOMY WITH RADIOACTIVE SEED AND SENTINEL LYMPH NODE BIOPSY AND RADIOACTIVE SEED TARGETED RIGHT AXILLARY LYMPH NODE EXCISION (Right Breast)  Patient Location: PACU  Anesthesia Type:General  Level of Consciousness: awake, alert  and oriented  Airway & Oxygen Therapy: Patient Spontanous Breathing and Patient connected to nasal cannula oxygen  Post-op Assessment: Report given to RN, Post -op Vital signs reviewed and stable and Patient moving all extremities  Post vital signs: Reviewed and stable  Last Vitals:  Vitals:   12/09/17 0653  BP: (!) 146/60  Pulse: 81  Resp: 20  Temp: 36.6 C    Last Pain:  Vitals:   12/09/17 0733  TempSrc:   PainSc: 0-No pain      Patients Stated Pain Goal: 5 (37/85/88 5027)  Complications: No apparent anesthesia complications

## 2017-12-09 NOTE — Anesthesia Procedure Notes (Signed)
Procedure Name: LMA Insertion Date/Time: 12/09/2017 8:32 AM Performed by: Audry Pili, MD Pre-anesthesia Checklist: Patient identified, Suction available, Patient being monitored and Timeout performed Patient Re-evaluated:Patient Re-evaluated prior to induction Oxygen Delivery Method: Circle system utilized Preoxygenation: Pre-oxygenation with 100% oxygen Induction Type: IV induction Ventilation: Oral airway inserted - appropriate to patient size LMA: LMA inserted LMA Size: 4.0 Number of attempts: 1 Placement Confirmation: positive ETCO2,  CO2 detector and breath sounds checked- equal and bilateral Tube secured with: Tape Dental Injury: Teeth and Oropharynx as per pre-operative assessment  Comments: Inserted by Burnett Corrente SRNA

## 2017-12-10 ENCOUNTER — Encounter (HOSPITAL_COMMUNITY): Payer: Self-pay | Admitting: Surgery

## 2018-01-06 ENCOUNTER — Encounter: Payer: Self-pay | Admitting: Radiation Oncology

## 2018-01-07 ENCOUNTER — Encounter (HOSPITAL_COMMUNITY): Payer: Self-pay | Admitting: Internal Medicine

## 2018-01-07 ENCOUNTER — Other Ambulatory Visit: Payer: Self-pay

## 2018-01-07 ENCOUNTER — Inpatient Hospital Stay (HOSPITAL_COMMUNITY): Payer: 59 | Attending: Internal Medicine | Admitting: Internal Medicine

## 2018-01-07 VITALS — BP 145/78 | HR 70 | Temp 98.2°F | Resp 18 | Wt 250.6 lb

## 2018-01-07 DIAGNOSIS — G4730 Sleep apnea, unspecified: Secondary | ICD-10-CM | POA: Insufficient documentation

## 2018-01-07 DIAGNOSIS — C773 Secondary and unspecified malignant neoplasm of axilla and upper limb lymph nodes: Secondary | ICD-10-CM | POA: Insufficient documentation

## 2018-01-07 DIAGNOSIS — E119 Type 2 diabetes mellitus without complications: Secondary | ICD-10-CM | POA: Diagnosis not present

## 2018-01-07 DIAGNOSIS — D696 Thrombocytopenia, unspecified: Secondary | ICD-10-CM | POA: Diagnosis not present

## 2018-01-07 DIAGNOSIS — D649 Anemia, unspecified: Secondary | ICD-10-CM | POA: Diagnosis not present

## 2018-01-07 DIAGNOSIS — Z87442 Personal history of urinary calculi: Secondary | ICD-10-CM | POA: Diagnosis not present

## 2018-01-07 DIAGNOSIS — R634 Abnormal weight loss: Secondary | ICD-10-CM | POA: Diagnosis not present

## 2018-01-07 DIAGNOSIS — E785 Hyperlipidemia, unspecified: Secondary | ICD-10-CM | POA: Diagnosis not present

## 2018-01-07 DIAGNOSIS — Z170 Estrogen receptor positive status [ER+]: Secondary | ICD-10-CM | POA: Diagnosis not present

## 2018-01-07 DIAGNOSIS — J45909 Unspecified asthma, uncomplicated: Secondary | ICD-10-CM | POA: Insufficient documentation

## 2018-01-07 DIAGNOSIS — Z780 Asymptomatic menopausal state: Secondary | ICD-10-CM

## 2018-01-07 DIAGNOSIS — Z79899 Other long term (current) drug therapy: Secondary | ICD-10-CM | POA: Insufficient documentation

## 2018-01-07 DIAGNOSIS — K219 Gastro-esophageal reflux disease without esophagitis: Secondary | ICD-10-CM | POA: Diagnosis not present

## 2018-01-07 DIAGNOSIS — C50211 Malignant neoplasm of upper-inner quadrant of right female breast: Secondary | ICD-10-CM

## 2018-01-07 DIAGNOSIS — I10 Essential (primary) hypertension: Secondary | ICD-10-CM | POA: Diagnosis not present

## 2018-01-07 NOTE — Patient Instructions (Addendum)
[place] at [facility] Discharge Instructions   You were seen today by Dr. Zoila Shutter. She discussed your recent results from your surgery. They did find residual disease in some lymph nodes and in the breast.  Keep appointment with radiation doctor to discuss radiation therapy. She discussed starting you on Femara, you can take this while getting radiation treatments.  We will get a bone density test scheduled for you. We will also get Echos every 3 months.  Continue Herceptin as scheduled.  We will send Femara to your pharmacy. We will see you back as scheduled.         Femara    Generic Name: Letrozole Femara  is the trade name for the generic drug letrozole. In some cases, health care professionals may use the trade name Femara  when referring to the generic drug name letrozole. Drug Type: Femara is a hormone therapy. Femara is classified as an aromatase inhibitor. (For more detail, see "How Femara Works" section below). What Femara Is Used For: Femara is used to treat breast cancer in postmenopausal women. Note: If a drug has been approved for one use, physicians may elect to use this same drug for other problems if they believe it may be helpful.  How Femara Is Given: Femara is a pill, taken by mouth.  You should take Letrozole at about the same time each day. You may take Femara with or without food. If you miss a dose, do not take a double dose the next day.  You should not stop taking Femara without discussing with your physician.  The amount of Femara that you will receive depends on many factors, including your general health or other health problems, and the type of cancer or condition being treated. Your doctor will determine your dose and how long you will be taking Femara. Side Effects: Important things to remember about the side effects of Femara: Most people do not experience all of the side effects listed.  Side effects are often  predictable in terms of their onset and duration.  Side effects are almost always reversible and will go away after treatment is complete.  There are many options to help minimize or prevent side effects.  There is no relationship between the presence or severity of side effects and the effectiveness of the medication. The following side effects are common (occurring in greater than 30%) for patients taking Femara: Hot flashes  High cholesterol These side effects are less common side effects (occurring in about 10-29%) of patients receiving Femara: Arthralgias(bone and joint pain)  Night sweats  Weight gain  Nausea Not all side effects are listed above. Some that are rare (occurring in less than 10% of patients) are not listed here. However, you should always inform your health care provider if you experience any unusual symptoms. When to contact your doctor or health care provider: The following symptoms require medical attention, but are not an emergency. Contact your health care provider within 24 hours of noticing any of the following: Symptoms of recurrent period (vaginal bleeding/spotting) Always inform your health care provider if you experience any unusual symptoms. Precautions: Before starting Femara treatment, make sure you tell your doctor about any other medications you are taking (including prescription, over-the-counter, vitamins, herbal remedies, etc.).  Inform your health care professional if you are pregnant or may be pregnant prior to starting this treatment. Pregnancy category D (Femara may be hazardous to the fetus. Women who are pregnant or become pregnant  must be advised of the potential hazard to the fetus).  Femara is indicated for post-menopausal women. Do not conceive a child (get pregnant) while taking Femara. Barrier methods of contraception, such as condoms, are recommended. Discuss with your doctor when you may safely become pregnant or conceive a child after  therapy.  Do not breast feed while taking Femara. Self-Care Tips: If you are experiencing hot flashes, wearing light clothing, staying in a cool environment, and putting cool cloths on your head may reduce symptoms. Consult you health care provider if these worsen, or become intolerable.  Acetaminophen or ibuprophen may help relieve discomfort from generalized aches and pains. However, be sure to talk with your doctor before taking it.  Femara causes little nausea. But if you should experience nausea, take anti-nausea medications as prescribed by your doctor, and eat small frequent meals. Sucking on lozenges and chewing gum may also help.  Avoid sun exposure. Wear SPF 61 (or higher) sunblock and protective clothing.  In general, drinking alcoholic beverages should be kept to a minimum or avoided completely. You should discuss this with your doctor.  Get plenty of rest.  Maintain good nutrition.  If you experience symptoms or side effects, be sure to discuss them with your health care team. They can prescribe medications and/or offer other suggestions that are effective in managing such problems. Monitoring and Testing: You will be monitored regularly by your doctor while you are taking Femara, but no special tests or blood tests are required.  How Femara Works: Hormones are chemical substances that are produced by glands in the body, which enter the bloodstream and cause effects in other tissues. For example, the hormone testosterone made in the testicles and is responsible for female characteristics such as deepening voice and increased body hair. The use of hormone therapy to treat cancer is based on the observation that receptors for specific hormones that are needed for cell growth are on the surface of some tumor cells. Hormone therapies work by stopping the production of a certain hormone, blocking hormone receptors, or substituting chemically similar agents for the active hormone, which cannot be  used by the tumor cell. The different types of hormone therapies are categorized by their function and/or the type of hormone that is effected. Femara is an aromatase inhibitor. This means it blocks the enzyme aromatase (found in the body's muscle, skin, breast and fat), which is used to convert androgens (hormones produced by the adrenal glands) into estrogen. In the absence of estrogen, tumors dependent on this hormone for growth will shrink. Note: We strongly encourage you to talk with your health care professional about your specific medical condition and treatments. The information contained in this website is meant to be helpful and educational, but is not a substitute for medical advice.  Update 06/11/2011  Have questions about chemotherapy? Call the [place] at [place]  Managing Side Effects  Eating Well During Chemotherapy  What is Chemotherapy?  Before and After Chemotherapy  Survivor Testimonials  Frequently Asked Questions  Educational Videos  Chemotherapy Resources  Complementary Medicine  Self Help  Spanish Version [facility] Support Contact us  Teacher, music  Site Map  [employer] [place]  [facility]th [facility] Copyright  2002 - 2019 by [employer]  All rights reserved.          Thank you for choosing [facility]  at [facility] to provide your oncology and hematology care.  To afford each patient quality time with our provider, please arrive at least 15 minutes before your scheduled appointment time.    If you have a lab appointment with the [place] please come in thru the  Main Entrance and check in at the main information desk  You need to re-schedule your appointment should you arrive 10 or more minutes late.  We strive to give you quality time with our providers, and  arriving late affects you and other patients whose appointments are after yours.  Also, if you no show three or more times for appointments you may be dismissed from the clinic at the providers discretion.     Again, thank you for choosing [facility].  Our hope is that these requests will decrease the amount of time that you wait before being seen by our physicians.       _____________________________________________________________  Should you have questions after your visit to [facility], please contact our office at [phone] between the hours of 8:30 a.m. and 4:30 p.m.  Voicemails left after 4:30 p.m. will not be returned until the following business day.  For prescription refill requests, have your pharmacy contact our office.       Resources For Cancer Patients and their Caregivers ? American Cancer Society: Can assist with transportation, wigs, general needs, runs Look Good Feel Better.        639-143-8848 ? Cancer Care: Provides financial assistance, online support groups, medication/co-pay assistance.  1-800-813-HOPE 204-338-9912) ? [facility] Assists [place] cancer patients and their families through emotional , educational and financial support.  250-231-6676 ? [place] DSS Where to apply for food stamps, Medicaid and utility assistance. 4251007664 ? [facility]: Transportation to medical appointments. 919 798 2276 ? Social Security Administration: May apply for disability if have a Stage IV cancer. 559-453-0014 626-214-9393 ? [employer], Disability and Transit Services: Assists with nutrition, care and transit needs. [place] Support Programs:   > Cancer Support Group  2nd Tuesday of the month 1pm-2pm, Journey Room   > Creative Journey  3rd Tuesday of the month 1130am-1pm, Journey Room

## 2018-01-07 NOTE — Progress Notes (Signed)
Diagnosis Malignant neoplasm of upper-inner quadrant of right breast in female, estrogen receptor positive ([place]) - Plan: DG Bone Density, ECHOCARDIOGRAM COMPLETE  Staging Cancer Staging Malignant neoplasm of upper-inner quadrant of right breast in female, estrogen receptor positive ([place]) Staging form: Breast, AJCC 8th Edition - Clinical stage from 04/22/2017: Stage IB (cT2, cN0, cM0, G3, ER: Positive, PR: Positive, HER2: Positive) - Unsigned Staging comments: Staged at breast conference on 7.11.18   Assessment and Plan:  1.  pT1cN1a Stage IIA  invasive ductal carcinoma of (R) breast; ER+/PR+/HER2+.  Pt had screening mammogram in 03/2017 for (R) breast abnormality. Diagnostic mammogram/ultrasound of (R) breast suspicious for malignancy. Underwent (R) breast biopsy on 04/13/17 revealing invasive ductal carcinoma, grade 3, ER+, PR+, HER2+. Seen initially in consultation with Dr. Lindi Adie at [facility], and patient elected to have her treatment closer to home and [facility] with [facility] on 05[facility]29/17. Marland Kitchen   Pt completed neoadjuvant [facility]P chemo on 10/09/17. She was being followed by NP Renato Battles and Dr. Sherrine Maples.   She was scheduled to see her surgeon, Dr. Lucia Gaskins, but she had to reschedule this appointment due to  recent injury that her husband sustained.  It is very important to her that her husband be present for this appointment.    Treatment breast MRI showed only partial response to both the primary breast mass and axillary adenopathy. MRI done 11/11/2017 showed right breast lesion now measuring 2.3 cm previously 2.7 cm per MRI report.  ECHO done 11/02/2017 showed normal EF 60-65%.  She has undergone right lumpectomy on 12/09/2017.  Lesion #1 of the right breast was intraductal papilloma.  There was no evidence of malignancy.  Lesion to of the right breast showed invasive ductal carcinoma grade 3 measuring 2 cm and  there was evidence of high-grade DCIS.  She had one lymph  node positive for disease.  Nipple biopsy on the right showed no malignancy.  Margins were greater than 0.2 cm in all final margins.  Her tumor was ER +100% PR +70% HER-2 was positive by FISH with a ratio of 2.37.  Ki 67 was 60%.  She had evidence of residual disease with one positive lymph node on definitive surgery that was done on December 09, 2017.  She will be a candidate for adjuvant endocrine therapy with Femara 2.5 mg p.o. daily.  Options discussed were due to residual disease on adjuvant Herceptin and Perjeta would be to switch to Kadcyla  3.6 mg/kg every 3 weeks for 14 cycles based on results of the recent  [facility].  Pt will be set up for chemotherapy teaching.  Therapy will proceed with RT and Endocrine therapy.  She will be set up for bone density evaluation and will follow-up 01/20/2018 for evaluation prior to C1 of Kadcyla.  She is scheduled to be seen by RT.    2.  Anemia.  Hgb 10 on recent labs.  Pt recently transfused.  She will have repeat labs prior to C1.    3.  Thrombocytopenia.  Plts 118,000 on recent labs.  She will have repeat labs on RTC.    Current Status:  Pt seen today for follow-up.  She is here to go over pathology from recent surgery.       Malignant neoplasm of upper-inner quadrant of right breast in female, estrogen receptor positive ([place])   04/13/2017 Initial Diagnosis    Screening detected right breast mass with calcifications 2.1 cm in size and 3.4 cm in size.  Calcifications were fibroadenoma. Right breast biopsy upper inner quadrant 1:00 mass: IDC with DCIS with necrosis, grade 3, ER 100%, PR 70%, Ki-67 60%, HER-2 positive ratio 2.34, T2 N1 stage IB (New AJCC)      04/25/2017 Breast MRI    Right breast upper inner quadrant 2.2 x 1.7 x 2.7 cm mass, abnormal area of clumped non-mass enhancement in the right lateral breast 3.2 x 1.9 x 1 cm, too abnormal lymph nodes right axilla 4.3 and 1.7 cm (biopsy-proven breast cancer)       05/08/2017 Procedure    Right  axilla lymph node biopsy: Metastatic carcinoma      05/13/2017 Procedure    Right breast biopsy retroareolar region: Intraductal papilloma      Problem List Patient Active Problem List   Diagnosis Date Noted  . Weight loss [R63.4] 06/13/2017  . Malignant neoplasm of upper-inner quadrant of right breast in female, estrogen receptor positive ([place]) [C50.211, Z17.0] 04/16/2017    Past Medical History Past Medical History:  Diagnosis Date  . Asthma   . Blood transfusion    as child  . Cancer ([place])   . Diabetes ([place])    type 2   . GERD (gastroesophageal reflux disease)   . Headache   . Heart murmur    d/t aortic stenosis   . History of kidney stones   . Hyperlipidemia   . Hypertension   . Normal echocardiogram   . Sleep apnea     Past Surgical History Past Surgical History:  Procedure Laterality Date  . ABDOMINAL HYSTERECTOMY  2004  . BREAST LUMPECTOMY WITH RADIOACTIVE SEED AND SENTINEL LYMPH NODE BIOPSY Right 12/09/2017   Procedure: RIGHT BREAST LUMPECTOMY WITH RADIOACTIVE SEED AND SENTINEL LYMPH NODE BIOPSY AND RADIOACTIVE SEED TARGETED RIGHT AXILLARY LYMPH NODE EXCISION;  Surgeon: Alphonsa Overall, MD;  Location: [place];  Service: General;  Laterality: Right;  . CHOLECYSTECTOMY  05/30/2011   Procedure: LAPAROSCOPIC CHOLECYSTECTOMY;  Surgeon: Donato Heinz;  Location: [facility];  Service: General;  Laterality: N/A;  . 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 KIDNEY STONE SURGERY     2017 2-23  . PORTACATH PLACEMENT N/A 05/25/2017   Procedure: INSERTION PORT-A-CATH;  Surgeon: Alphonsa Overall, MD;  Location: [facility];  Service: General;  Laterality: N/A;    Family History Family History  Problem Relation Age of Onset  . Lung cancer Maternal Grandmother   . Anesthesia problems Neg Hx   . Hypotension Neg Hx   . Malignant hyperthermia Neg Hx   . Pseudochol deficiency Neg Hx      Social History  reports that she has never smoked. She has never used  smokeless tobacco. She reports that she drinks alcohol. She reports that she does not use drugs.  Medications  Current Outpatient Medications:  .  acetaminophen (TYLENOL) 500 MG tablet, Take 1,000 mg by mouth daily as needed for moderate pain or headache., Disp: , Rfl:  .  albuterol (PROVENTIL HFA;VENTOLIN HFA) 108 (90 BASE) MCG/ACT inhaler, Inhale 1 puff into the lungs every 6 (six) hours as needed for wheezing or shortness of breath. , Disp: , Rfl:  .  diphenhydrAMINE (BENADRYL) 25 mg capsule, Take 25 mg by mouth at bedtime as needed for allergies. , Disp: , Rfl:  .  diphenoxylate-atropine (LOMOTIL) 2.5-0.025 MG tablet, TAKE (1) TABLET BY MOUTH (4) TIMES DAILY AS NEEDED FOR DIARRHEA OR LOOSE STOOLS., Disp: 45 tablet, Rfl: 0 .  escitalopram (LEXAPRO)  20 MG tablet, Take 20 mg by mouth at bedtime. , Disp: , Rfl:  .  fenofibrate 160 MG tablet, Take 160 mg by mouth at bedtime. , Disp: , Rfl:  .  fluticasone (FLONASE) 50 MCG/ACT nasal spray, Place 1 spray into both nostrils daily as needed for allergies or rhinitis., Disp: , Rfl:  .  HYDROcodone-acetaminophen (NORCO/VICODIN) 5-325 MG tablet, Take 1 tablet by mouth every 6 (six) hours as needed for moderate pain., Disp: 20 tablet, Rfl: 0 .  lidocaine-prilocaine (EMLA) cream, Apply 1 application topically daily as needed (port access). , Disp: , Rfl:  .  loperamide (IMODIUM A-D) 2 MG tablet, Take 4 mg by mouth as needed for diarrhea or loose stools., Disp: , Rfl:  .  losartan (COZAAR) 50 MG tablet, Take 25 mg by mouth at bedtime. , Disp: , Rfl:  .  magic mouthwash w/lidocaine SOLN, Take 5 mLs by mouth 4 (four) times daily as needed for mouth pain., Disp: 360 mL, Rfl: 1 .  Multiple Vitamin (MULTIVITAMIN) tablet, Take 1 tablet by mouth at bedtime. , Disp: , Rfl:  .  neomycin-bacitracin-polymyxin (NEOSPORIN) ointment, Apply 1 application topically as needed for wound care., Disp: , Rfl:  .  omeprazole (PRILOSEC) 40 MG capsule, Take 1 capsule (40 mg total)  by mouth daily. (Patient taking differently: Take 40 mg by mouth daily as needed (heartburn). ), Disp: 30 capsule, Rfl: 5 .  prochlorperazine (COMPAZINE) 10 MG tablet, TAKE (1) TABLET BY MOUTH EVERY SIX HOURS AS NEEDED FOR NAUSEA AND VOMITING., Disp: 30 tablet, Rfl: 0  Allergies Latex; Pyridostigmine bromide; and Statins  Review of Systems Review of Systems - Oncology ROS as per HPI otherwise 12 point ROS is negative.   Physical Exam  Vitals Wt Readings from Last 3 Encounters:  01/07/18 250 lb 9.6 oz (113.7 kg)  12/09/17 249 lb (112.9 kg)  12/03/17 249 lb 1.6 oz (113 kg)   Temp Readings from Last 3 Encounters:  01/07/18 98.2 F (36.8 C) (Oral)  12/09/17 98.2 F (36.8 C)  12/03/17 98 F (36.7 C)   BP Readings from Last 3 Encounters:  01/07/18 (!) 145/78  12/09/17 125/68  12/03/17 136/88   Pulse Readings from Last 3 Encounters:  01/07/18 70  12/09/17 76  12/03/17 74   Constitutional: Well-developed, well-nourished, and in no distress.   HENT: Head: Normocephalic and atraumatic.  Mouth/Throat: No oropharyngeal exudate. Mucosa moist. Eyes: Pupils are equal, round, and reactive to light. Conjunctivae are normal. No scleral icterus.  Neck: Normal range of motion. Neck supple. No JVD present.  Cardiovascular: Normal rate, regular rhythm and normal heart sounds.  Exam reveals no gallop and no friction rub.   No murmur heard. Pulmonary/Chest: Effort normal and breath sounds normal. No respiratory distress. No wheezes.No rales.  Abdominal: Soft. Bowel sounds are normal. No distension. There is no tenderness. There is no guarding.  Musculoskeletal: No edema or tenderness.  Lymphadenopathy: No cervical, axillary or supraclavicular adenopathy.  Neurological: Alert and oriented to person, place, and time. No cranial nerve deficit.  Skin: Skin is warm and dry. No rash noted. No erythema. No pallor.  Psychiatric: Affect and judgment normal.  Bilateral breast exam:  Right lumpectomy  noted.  Crusted area over nipple noted on right .  Minor bruises noted.  Left breast showed no palpable masses.    Labs No visits with results within 3 Day(s) from this visit.  Latest known visit with results is:  Admission on 12/09/2017, Discharged on 12/09/2017  Component  Date Value Ref Range Status  . Glucose-Capillary 12/09/2017 245* 65 - 99 mg/dL Final  . Glucose-Capillary 12/09/2017 147* 65 - 99 mg/dL Final     Pathology Orders Placed This Encounter  Procedures  . DG Bone Density    Standing Status:   Future    Standing Expiration Date:   01/07/2019    Order Specific Question:   Reason for Exam (SYMPTOM  OR DIAGNOSIS REQUIRED)    Answer:   breast cancer for AI    Order Specific Question:   Is the patient pregnant?    Answer:   No    Order Specific Question:   Preferred imaging location?    Answer:   [facility]  . ECHOCARDIOGRAM COMPLETE    Monitoring heart function while on Herceptin and Perjeta    Standing Status:   Future    Standing Expiration Date:   04/10/2019    Order Specific Question:   Where should this test be performed    Answer:   [facility]    Order Specific Question:   Perflutren DEFINITY (image enhancing agent) should be administered unless hypersensitivity or allergy exist    Answer:   Administer Perflutren    Order Specific Question:   Expected Date:    Answer:   1 week       Zoila Shutter MD

## 2018-01-08 NOTE — Addendum Note (Signed)
Addended by: Farley Ly on: 01/08/2018 09:53 AM   Modules accepted: Orders

## 2018-01-11 NOTE — Progress Notes (Signed)
Location of Breast Cancer:Malignant neoplasm of upper-inner quadrant of right breast in female  Histology per Pathology Report:   Diagnosis 05-13-17 Breast, right, needle core biopsy, retroareolar - INTRADUCTAL PAPILLOMA. - BENIGN CALCIFICATIONS. - NO MALIGNANCY IDENTIFIED.   Diagnosis 04-13-17 1. Breast, right, needle core biopsy, UOQ right breast SA calcifications - FIBROADENOMA WITH CALCIFICATIONS - NO CARCINOMA IDENTIFIED 2. Breast, right, needle core biopsy, UIQ 1 o'clock mass - INVASIVE DUCTAL CARCINOMA - DUCTAL CARCINOMA IN SITU WITH NECROSIS - SEE COMMENT Microscopic Comment 2. Based on the biopsy, the carcinoma appears [place]   Receptor Status: ER(100 % +), PR (70 % +), Her2-neu (-), Ki-(60%)  Did patient present with symptoms (if so, please note symptoms) or was this found on screening mammography?: Diagnostic imaging revealed a mass in the right breast with calcifications.    Past/Anticipated interventions by surgeon, if any: ADDITIONAL INFORMATION:  2. PROGNOSTIC INDICATORS Results: IMMUNOHISTOCHEMICAL AND MORPHOMETRIC ANALYSIS PERFORMED MANUALLY Estrogen Receptor: 100%, POSITIVE, STRONG STAINING INTENSITY Progesterone Receptor: 50%, POSITIVE, STRONG/MODERATE STAINING INTENSITY REFERENCE RANGE ESTROGEN RECEPTOR NEGATIVE 0% POSITIVE =>1% REFERENCE RANGE PROGESTERONE RECEPTOR NEGATIVE 0% POSITIVE =>1%  2. FLUORESCENCE IN-SITU HYBRIDIZATION Results: HER2 - **POSITIVE RATIO OF HER2/CEP17 SIGNALS 2.37 AVERAGE HER2 COPY NUMBER PER CELL 6.40 Reference Range: NEGATIVE HER2/CEP17 Ratio <2.0 and average HER2 copy number <4.0 EQUIVOCAL HER2/CEP17 Ratio <2.0 and average HER2 copy number 4.0 and <6.0 POSITIVE HER2/CEP17 Ratio >=2.0 or <2.0 and average HER2 copy number >=6.0   Diagnosis 12-09-17 Dr. Alphonsa Overall 1. Breast, lumpectomy, Right #1 - INTRADUCTAL PAPILLOMA. - USUAL DUCTAL HYPERPLASIA. - FIBROCYSTIC CHANGES. - HEALING BIOPSY SITE. - THERE IS NO  EVIDENCE OF MALIGNANCY. - SEE COMMENT. 2. Breast, lumpectomy, Right #2 - INVASIVE DUCTAL CARCINOMA, GRADE III/III, SPANNING 2.0 CM. - DUCTAL CARCINOMA IN SITU, HIGH GRADE. - DUCTAL CARCINOMA IN SITU IS FOCALLY LESS THAN 0.1 CM TO THE SUPERIOR MARGIN OF SPECIMEN 1. - SEE ONCOLOGY TABLE BELOW. 3. Lymph node, biopsy, part 1 right axillary - METASTATIC CARCINOMA IN 1 OF 1 LYMPH NODE (1/1). 4. Lymph nodes, regional resection, Right Axillary #2 - THERE IS NO EVIDENCE OF CARCINOMA IN 4 OF 4 LYMPH NODES (0/4). 5. Lymph node, sentinel, biopsy, Right Axillary #3 - THERE IS NO EVIDENCE OF CARCINOMA IN 1 OF 1 LYMPH NODE (0/1). 6. Nipple Biopsy, right - BENIGN BREAST PARENCHYMA WITH ASSOCIATED FIBROSIS. - LYMPH NODAL TISSUE IS NOT IDENTIFIED. - THERE IS NO EVIDENCE OF MALIGNANCY. 7. Breast, excision, Right additional Superior Margin - BENIGN BREAST PARENCHYMA. - THERE IS NO EVIDENCE OF MALIGNANCY. - SEE COMMENT. 8. Breast, excision, Right additional Medial Margin - BENIGN BREAST PARENCHYMA. - THERE IS NO EVIDENCE OF MALIGNANCY. - SEE COMMENT.  Receptor Status: ER(100% +), PR (50 % +), Her2-neu (- ratio 2.34), Ki-(60 %)   Past/Anticipated interventions by medical oncology, if any: Dr. Lindi Adie  adjuvant chemotherapy with [facility] Perjeta 6 cycles followed Herceptin Perjeta for 1 year Followed by adjuvant radiation Followed by adjuvant antiestrogen therapy +/- Neratinib   Lymphedema issues, if any:  No ROM to right arm good. Skin to right breast with redness to breast and under the lower axilla and drain site with scant yellowish drainage with itching using neosporin ointment daily after cleansing with soap and water.   Follow up appointment with Dr. Alphonsa Overall 12-31-17 and the JP drain was removed; will see Dr. Lucia Gaskins again at the end of the month.  Pain issues, if any:  No  SAFETY ISSUES:  Prior radiation?: No  Pacemaker/ICD? : No  Possible current pregnancy?:No  Is the patient on  methotrexate? : No        Menarche 20 G 2 P0 Menopause 42 HRT No  Current Complaints / other details:  Lung cancer Maternal Grandmother Wt Readings from Last 3 Encounters:  01/12/18 251 lb 9.6 oz (114.1 kg)  01/07/18 250 lb 9.6 oz (113.7 kg)  12/09/17 249 lb (112.9 kg)  BP (!) 128/91 (BP Location: Left Arm, Patient Position: Sitting, Cuff Size: Large)   Pulse 64   Temp 97.7 F (36.5 C) (Oral)   Resp 20   Ht '5\' 1"'  (1.549 m)   Wt 251 lb 9.6 oz (114.1 kg)   BMI 47.54 kg/m     Georgena Spurling, RN 01/11/2018,9:03 AM

## 2018-01-12 ENCOUNTER — Encounter (HOSPITAL_COMMUNITY): Payer: Self-pay | Admitting: Emergency Medicine

## 2018-01-12 ENCOUNTER — Other Ambulatory Visit: Payer: Self-pay

## 2018-01-12 ENCOUNTER — Encounter: Payer: Self-pay | Admitting: Radiation Oncology

## 2018-01-12 ENCOUNTER — Ambulatory Visit
Admission: RE | Admit: 2018-01-12 | Discharge: 2018-01-12 | Disposition: A | Discharge status: Home / Self Care | Payer: 59 | Source: Ambulatory Visit | Attending: Radiation Oncology | Admitting: Radiation Oncology

## 2018-01-12 ENCOUNTER — Other Ambulatory Visit (HOSPITAL_COMMUNITY): Payer: Self-pay | Admitting: Emergency Medicine

## 2018-01-12 VITALS — BP 128/91 | HR 64 | Temp 97.7°F | Resp 20 | Ht 61.0 in | Wt 251.6 lb

## 2018-01-12 DIAGNOSIS — Z888 Allergy status to other drugs, medicaments and biological substances status: Secondary | ICD-10-CM | POA: Insufficient documentation

## 2018-01-12 DIAGNOSIS — K219 Gastro-esophageal reflux disease without esophagitis: Secondary | ICD-10-CM | POA: Diagnosis not present

## 2018-01-12 DIAGNOSIS — C50211 Malignant neoplasm of upper-inner quadrant of right female breast: Secondary | ICD-10-CM | POA: Insufficient documentation

## 2018-01-12 DIAGNOSIS — Z170 Estrogen receptor positive status [ER+]: Secondary | ICD-10-CM | POA: Diagnosis present

## 2018-01-12 DIAGNOSIS — I10 Essential (primary) hypertension: Secondary | ICD-10-CM | POA: Diagnosis not present

## 2018-01-12 DIAGNOSIS — Z9221 Personal history of antineoplastic chemotherapy: Secondary | ICD-10-CM | POA: Insufficient documentation

## 2018-01-12 DIAGNOSIS — E119 Type 2 diabetes mellitus without complications: Secondary | ICD-10-CM | POA: Diagnosis not present

## 2018-01-12 DIAGNOSIS — Z87442 Personal history of urinary calculi: Secondary | ICD-10-CM | POA: Diagnosis not present

## 2018-01-12 DIAGNOSIS — Z79899 Other long term (current) drug therapy: Secondary | ICD-10-CM | POA: Diagnosis not present

## 2018-01-12 DIAGNOSIS — J45909 Unspecified asthma, uncomplicated: Secondary | ICD-10-CM | POA: Diagnosis not present

## 2018-01-12 DIAGNOSIS — E785 Hyperlipidemia, unspecified: Secondary | ICD-10-CM | POA: Diagnosis not present

## 2018-01-12 DIAGNOSIS — G4730 Sleep apnea, unspecified: Secondary | ICD-10-CM | POA: Insufficient documentation

## 2018-01-12 DIAGNOSIS — Z9049 Acquired absence of other specified parts of digestive tract: Secondary | ICD-10-CM | POA: Diagnosis not present

## 2018-01-12 MED ORDER — CEPHALEXIN 500 MG PO CAPS: 500.0000 mg | ORAL_CAPSULE | Freq: Three times a day (TID) | ORAL | 0 refills | Status: DC

## 2018-01-12 MED ORDER — LETROZOLE 2.5 MG PO TABS: 2.5000 mg | ORAL_TABLET | Freq: Every day | ORAL | 6 refills | Status: DC

## 2018-01-12 NOTE — Progress Notes (Signed)
Sent Femara prescription to [facility].  Called pt to let her know I sent the script to the pharmacy.

## 2018-01-12 NOTE — Progress Notes (Signed)
Radiation Oncology         [phone] ________________________________  Name: Renee Harris MRN: [id number]  Date: 01/12/2018  DOB: October 08, 1961  OI:[id number], Jenny Reichmann, MD  Alphonsa Overall, MD     REFERRING PHYSICIAN: Alphonsa Overall, MD   DIAGNOSIS: The encounter diagnosis was Malignant neoplasm of upper-inner quadrant of right breast in female, estrogen receptor positive ([place]).   HISTORY OF PRESENT ILLNESS: Renee Harris is a 57 y.o. female originally seen in the multidisciplinary breast clinic for a new diagnosis of right breast cancer. She was found to have a screening detected assymetry and subsequent diagnostic imaging revealed a mass in the right breast with calcifications. hte mass measured 2.1 x 2 x 1.3 cm at the 12-1:00 position, and the calcifications spanned 3.4 cm. Her axilla on the right was negative for adenopathy.  A biopsy of the right breast on 04/13/17 revealed fibroadenomatous change in the lower outer quadrant in the location of calcifications and was coordinate to the imaging findings, though in the upper inner quadrant, the biopsy of the mass revealed a grade 3, invasive ductal carcinoma, with DCIS and necrosis, triple positive with a Ki-67 of 60%. She proceeded with MRI of the right breast and the original measurement  in August 2018 was 2.7 x 1.7 x 2.2 cm and her axillary node was 4.3 cm, the primary was 2.1 x 1.2 x 1.9 cm and axilla was 3 cm in October 2018. In January 2019, the mass was 2.3 x 1.5 x 1.9 cm, and the axilla was 3.1 cm. She completed chemotherapy on 10/09/18 and has continued herceptin/perjeta and last received this on 11/24/17. She underwent right lumpectomy and targeted axillary dissection on 2/27/219, and final pathology revealed an intraductal papilloma in the first lumpectomy site, as well as a  grade 3 invasive ductal carcinoma with high grade DCIS, and DCIS focally less than 1 mm to the superior margin, one node was positive, and her additional superior,  medial, and nipple specimens were negative for disease. She is going to proceed with Kadcyla given the persistent disease noted following neoadjuvant treatment. She comes today to discuss local therapy with adjuvant radiation.  PREVIOUS RADIATION THERAPY: No   PAST MEDICAL HISTORY:  Past Medical History:  Diagnosis Date  . Asthma   . Blood transfusion    as child  . Cancer ([place])   . Diabetes ([place])    type 2   . GERD (gastroesophageal reflux disease)   . Headache   . Heart murmur    d/t aortic stenosis   . History of kidney stones   . Hyperlipidemia   . Hypertension   . Normal echocardiogram   . Sleep apnea        PAST SURGICAL HISTORY: Past Surgical History:  Procedure Laterality Date  . ABDOMINAL HYSTERECTOMY  2004  . BREAST LUMPECTOMY WITH RADIOACTIVE SEED AND SENTINEL LYMPH NODE BIOPSY Right 12/09/2017   Procedure: RIGHT BREAST LUMPECTOMY WITH RADIOACTIVE SEED AND SENTINEL LYMPH NODE BIOPSY AND RADIOACTIVE SEED TARGETED RIGHT AXILLARY LYMPH NODE EXCISION;  Surgeon: Alphonsa Overall, MD;  Location: [facility];  Service: General;  Laterality: Right;  . CHOLECYSTECTOMY  05/30/2011   Procedure: LAPAROSCOPIC CHOLECYSTECTOMY;  Surgeon: Donato Heinz;  Location: [facility];  Service: General;  Laterality: N/A;  . 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 KIDNEY STONE SURGERY     2017 2-23  . PORTACATH PLACEMENT N/A 05/25/2017   Procedure: INSERTION PORT-A-CATH;  Surgeon: Alphonsa Overall, MD;  Location: [facility];  Service: General;  Laterality: N/A;     FAMILY HISTORY:  Family History  Problem Relation Age of Onset  . Lung cancer Maternal Grandmother   . Anesthesia problems Neg Hx   . Hypotension Neg Hx   . Malignant hyperthermia Neg Hx   . Pseudochol deficiency Neg Hx      SOCIAL HISTORY:  reports that she has never smoked. She has never used smokeless tobacco. She reports that she drinks alcohol. She reports that she does not use drugs. She lives in [place]  and is married. She takes care of young nieces and is accompanied by her husband.   ALLERGIES: Latex; Pyridostigmine bromide; and Statins   MEDICATIONS:  Current Outpatient Medications  Medication Sig Dispense Refill  . acetaminophen (TYLENOL) 500 MG tablet Take 1,000 mg by mouth daily as needed for moderate pain or headache.    . albuterol (PROVENTIL HFA;VENTOLIN HFA) 108 (90 BASE) MCG/ACT inhaler Inhale 1 puff into the lungs every 6 (six) hours as needed for wheezing or shortness of breath.     . diphenhydrAMINE (BENADRYL) 25 mg capsule Take 25 mg by mouth at bedtime as needed for allergies.     Marland Kitchen escitalopram (LEXAPRO) 20 MG tablet Take 20 mg by mouth at bedtime.     . fenofibrate 160 MG tablet Take 160 mg by mouth at bedtime.     . fluticasone (FLONASE) 50 MCG/ACT nasal spray Place 1 spray into both nostrils daily as needed for allergies or rhinitis.    Marland Kitchen HYDROcodone-acetaminophen (NORCO/VICODIN) 5-325 MG tablet Take 1 tablet by mouth every 6 (six) hours as needed for moderate pain. 20 tablet 0  . lidocaine-prilocaine (EMLA) cream Apply 1 application topically daily as needed (port access).     Marland Kitchen losartan (COZAAR) 50 MG tablet Take 25 mg by mouth at bedtime.     . Multiple Vitamin (MULTIVITAMIN) tablet Take 1 tablet by mouth at bedtime.     Marland Kitchen neomycin-bacitracin-polymyxin (NEOSPORIN) ointment Apply 1 application topically as needed for wound care.    Marland Kitchen omeprazole (PRILOSEC) 40 MG capsule Take 1 capsule (40 mg total) by mouth daily. (Patient taking differently: Take 40 mg by mouth daily as needed (heartburn). ) 30 capsule 5  . diphenoxylate-atropine (LOMOTIL) 2.5-0.025 MG tablet TAKE (1) TABLET BY MOUTH (4) TIMES DAILY AS NEEDED FOR DIARRHEA OR LOOSE STOOLS. (Patient not taking: Reported on 01/12/2018) 45 tablet 0  . loperamide (IMODIUM A-D) 2 MG tablet Take 4 mg by mouth as needed for diarrhea or loose stools.    . magic mouthwash w/lidocaine SOLN Take 5 mLs by mouth 4 (four) times daily as  needed for mouth pain. (Patient not taking: Reported on 01/12/2018) 360 mL 1  . prochlorperazine (COMPAZINE) 10 MG tablet TAKE (1) TABLET BY MOUTH EVERY SIX HOURS AS NEEDED FOR NAUSEA AND VOMITING. (Patient not taking: Reported on 01/12/2018) 30 tablet 0   No current facility-administered medications for this encounter.      REVIEW OF SYSTEMS: On review of systems, the patient reports that she is doing well overall but is tired since completion of chemo and surgery. She also notes some itchiness and redness of the breast. She denies any chest pain, shortness of breath, cough, fevers, chills, night sweats, unintended weight changes. She denies any bowel or bladder disturbances, and denies abdominal pain, nausea or vomiting. She denies any new musculoskeletal or joint aches or pains. A complete review of systems is obtained and is  otherwise negative.     PHYSICAL EXAM:  Wt Readings from Last 3 Encounters:  01/12/18 251 lb 9.6 oz (114.1 kg)  01/07/18 250 lb 9.6 oz (113.7 kg)  12/09/17 249 lb (112.9 kg)   Temp Readings from Last 3 Encounters:  01/12/18 97.7 F (36.5 C) (Oral)  01/07/18 98.2 F (36.8 C) (Oral)  12/09/17 98.2 F (36.8 C)   BP Readings from Last 3 Encounters:  01/12/18 (!) 128/91  01/07/18 (!) 145/78  12/09/17 125/68   Pulse Readings from Last 3 Encounters:  01/12/18 64  01/07/18 70  12/09/17 76     In general this is a well appearing caucasian female in no acute distress. She is alert and oriented x4 and appropriate throughout the examination. HEENT reveals that the patient is normocephalic, atraumatic. EOMs are intact. PERRLA. The right breast has a large eschar at 9:00, and cellulitic appearing change of the periareolar area. There is an open area laterally consistent with prior drain site with minimal fibrin and no erythema.   ECOG = 0  0 - Asymptomatic (Fully active, able to carry on all predisease activities without restriction)  1 - Symptomatic but completely  ambulatory (Restricted in physically strenuous activity but ambulatory and able to carry out work of a light or sedentary nature. For example, light housework, office work)  2 - Symptomatic, <50% in bed during the day (Ambulatory and capable of all self care but unable to carry out any work activities. Up and about more than 50% of waking hours)  3 - Symptomatic, >50% in bed, but not bedbound (Capable of only limited self-care, confined to bed or chair 50% or more of waking hours)  4 - Bedbound (Completely disabled. Cannot carry on any self-care. Totally confined to bed or chair)  5 - Death   Eustace Pen MM, Creech RH, Tormey DC, et al. [phone]). "Toxicity and response criteria of the [facility]". [place] Oncol. 5 (6): 649-55    LABORATORY DATA:  Lab Results  Component Value Date   WBC 5.7 12/03/2017   HGB 11.5 (L) 12/03/2017   HCT 35.0 (L) 12/03/2017   MCV 94.3 12/03/2017   PLT 118 (L) 12/03/2017   Lab Results  Component Value Date   NA 141 12/03/2017   K 4.1 12/03/2017   CL 106 12/03/2017   CO2 25 12/03/2017   Lab Results  Component Value Date   ALT 18 11/03/2017   AST 21 11/03/2017   ALKPHOS 65 11/03/2017   BILITOT 0.6 11/03/2017      RADIOGRAPHY: No results found.     IMPRESSION/PLAN: 1. Stage IB, cT2,N0, Mx , grade 3, triple positive invasive ductal carcinoma and DCIS with necrosis of the right breast with node involvement at the time of surgery, Stage IIA, pT1cN1a following neoadjuvant therapy. Dr. Lisbeth Renshaw discusses the final pathologic findings as well as the HER2 component of her disease. We discussed the risks, benefits, short, and long term effects of radiotherapy, and the patient is interested in proceeding. Dr. Lisbeth Renshaw discusses the delivery and logistics of radiotherapy and would anticipate a course of 6 1/2 weeks of treatment to the breast and regional nodes with high tangents. We will plan to proceed with simulation the week of 02/02/18 to  allow for continued healing. She is in agreement. Written consent is obtained and placed in the chart, a copy was provided to the patient.   In a visit lasting 45 minutes, greater than 50% of the time was spent face  to face discussing her case, and coordinating the patient's care.   The above documentation reflects my direct findings during this shared patient visit. Please see the separate note by Dr. Lisbeth Renshaw on this date for the remainder of the patient's plan of care.    Carola Rhine, PAC

## 2018-01-12 NOTE — Patient Instructions (Signed)
[facility]   CHEMOTHERAPY INSTRUCTIONS  We are going to start you on Kadcyla.  You will be treated every 3 weeks.  The first Kadcyla will be 90 minutes.  The second and further infusions will be 30 minutes long.  You will see the doctor regularly throughout treatment.  We monitor your lab work prior to every treatment.  The doctor monitors your response to treatment by the way you are feeling and your blood work.  There will be wait times while you are here for treatment.  It will take about 30 minutes to 1 hour for your lab work to result.  Then pharmacy has to mix your medications.     You will receive tylenol and benadryl prior to every treatment of kadcyla to help prevent any reaction to the drug.     POTENTIAL SIDE EFFECTS OF TREATMENT: Ado-trastuzumab emtansine (Kadcyla)  About This Drug Ado-trastuzumab emtansine is used to treat cancer. It is given in the vein (IV).  Possible Side Effects . Decrease in the number of platelets. This may raise your risk of bleeding. . Nausea . Constipation (not able to move bowels) . Bleeding . Headache . Changes in your liver function . Nosebleed . Tiredness . Joint, muscle and/or bone pain Note: Each of the side effects above was reported in 25% or greater of patients treated with ado-trastuzumab emtansine. Not all possible side effects are included above.  Warnings and Precautions . Inflammation (swelling) of the lungs. You may have a dry cough or trouble breathing. . While you are getting this drug in your vein (IV), you may have a reaction to the drug. Sometimes you may be given medication to stop or lessen these side effects. Your nurse will check you closely for these signs: fever or shaking chills, flushing, facial swelling, feeling dizzy, headache, trouble breathing, rash, itching, chest tightness, or chest pain. These reactions may happen after your infusion. If this happens, call 911 for emergency  care. . Skin and tissue irritation may involve redness, pain, warmth, or swelling at the IV site. This happens if the drug leaks out of the vein and into nearby tissue. . Changes in your liver function, which can very rarely cause liver failure. . Changes in your heart function. . Abnormal bleeding, which can very rarely be fatal - symptoms may be coughing up blood, throwing up blood (may look like coffee grounds), red or black tarry bowel movements, abnormally heavy menstrual flow, nosebleeds or any other unusual bleeding. . Effects on the nerves are called peripheral neuropathy. You may feel numbness, tingling, or pain in your hands and feet. It may be hard for you to button your clothes, open jars, or walk as usual. The effect on the nerves may get worse with more doses of the drug. These effects get better in some people after the drug is stopped but it does not get better in all people.  Important Information . Notify your doctor right away if you get pregnant during treatment or within 7 months of receiving treatment. There is a pregnancy exposure registry and a pregnancy pharmacovigilance program which monitors the effect on your pregnancy. It is recommended that you register with the Mother pregnancy registry and report your pregnancy to [place]. http:// www.motherpregnancyregistry.com/ . This drug may be present in the saliva, tears, sweat, urine, stool, vomit, semen, and vaginal secretions. Talk to your doctor and/or your nurse about the necessary precautions to take during this time.  Treating Side  Effects . Manage tiredness by pacing your activities for the day. . Be sure to include periods of rest between energy-draining activities. . To decrease your risk of bleeding, use a soft toothbrush. Check with your nurse before using dental floss. . Be very careful when using knives or tools . Use an electric shaver instead of a razor . Ask your doctor or nurse about medicines that are  available to help stop or lessen constipation. . If you are not able to move your bowels, check with your doctor or nurse before you use enemas, laxatives, or suppositories . Drink plenty of fluids (a minimum of eight glasses per day is recommended). . If you throw up or have loose bowel movements, you should drink more fluids so that you do not become dehydrated (lack water in the body from losing too much fluid). . To help with nausea and vomiting, eat small, frequent meals instead of three large meals a day. Choose foods and drinks that are at room temperature. Ask your nurse or doctor about other helpful tips and medicine that is available to help or stop lessen these symptoms. . Infusion reactions may happen for 24 hours after your infusion. If this happens, call 911 for emergency care. Marland Kitchen Keeping your pain under control is important to your well-being. Please tell your doctor or nurse if you are experiencing pain. . If you have numbness and tingling in your hands and feet, be careful when cooking, walking, and handling sharp objects and hot liquids. . If you have a nose bleed, sit with your head tipped slightly forward. Apply pressure by lightly pinching the bridge of your nose between your thumb and forefinger. Call your doctor if you feel dizzy or faint or if the bleeding doesn't stop after 10 to 15 minutes.  Food and Drug Interactions . There are no known interactions of ado-trastuzumab emtansine with food. . Check with your doctor or pharmacist about all other prescription medicines and dietary supplements you are taking before starting this medicine as there are known drug interactions with ado-trastuzumab emtansine. Also, check with your doctor or pharmacist before starting any new prescription or over-the-counter medicines, or dietary supplement to make sure that there are no interactions.  When to Call the Doctor Call your doctor or nurse if you have any of these symptoms and/or any new  or unusual symptoms: . Fever of 100.5 F (38 C) or higher . Chills . Pain in your chest . Dry cough . Trouble breathing . Swelling of legs, ankles, or feet . Weight gain of 5 pounds in one week (fluid retention) . Easy bleeding or bruising . Blood in your urine, vomit (bright red or coffee-ground) and/or stools ( bright red, or black/tarry) . Coughing up blood . No bowel movement in 3 days or when you feel uncomfortable. . Nausea that stops you from eating or drinking and/or is not relieved by prescribed medicines . Throwing up more than 3 times a day . Fatigue that interferes with your daily activities . Signs of infusion reaction: fever or shaking chills, flushing, facial swelling, feeling dizzy, headache, trouble breathing, rash, itching, chest tightness, or chest pain. . Pain that does not go away, or is not relieved by prescribed medicines . Numbness, tingling, or pain your hands and feet . Nose bleed that doesn't stop bleeding after 10 -15 minutes . Feeling dizzy or lightheaded . Signs of possible liver problems: dark urine, pale bowel movements, bad stomach pain, feeling very tired and weak, unusual  itching, or yellowing of the eyes or skin . While you are getting this drug, please tell your nurse right away if you have any pain, redness, or swelling at the site of the IV infusion . If you think you may be pregnant or may have impregnated your partner  Reproduction Warnings . Pregnancy warning: This drug can have harmful effects on the unborn baby. Women of child bearing potential should use effective methods of birth control during your cancer treatment and for at least 7 months after treatment. Men with female partners of child bearing potential should use effective methods of birth control during your cancer treatment and for at least 4 months after your cancer treatment. Let your doctor know right away if you think you may be pregnant or may have impregnated your partner. .  Breastfeeding warning: Women should not breast feed during treatment and for 7 months after treatment because this drug could enter the breast milk and cause harm to a breast feeding baby. . Fertility warning: In men and women both, this drug may affect your ability to have children in the future. Talk with your doctor or nurse if you plan to have children. Ask for information on sperm or egg banking.   SELF CARE ACTIVITIES WHILE ON CHEMOTHERAPY: Hydration Increase your fluid intake 48 hours prior to treatment and drink at least 8 to 12 cups (64 ounces) of water/decaff beverages per day after treatment. You can still have your cup of coffee or soda but these beverages do not count as part of your 8 to 12 cups that you need to drink daily. No alcohol intake.  Medications Continue taking your normal prescription medication as prescribed.  If you start any new herbal or new supplements please let us know first to make sure it is safe.  Mouth Care Have teeth cleaned professionally before starting treatment. Keep dentures and partial plates clean. Use soft toothbrush and do not use mouthwashes that contain alcohol. Biotene is a good mouthwash that is available at most pharmacies or may be ordered by calling [phone]. Use warm salt water gargles (1 teaspoon salt per 1 quart warm water) before and after meals and at bedtime. Or you may rinse with 2 tablespoons of three-percent hydrogen peroxide mixed in eight ounces of water. If you are still having problems with your mouth or sores in your mouth please call the clinic. If you need dental work, please let the doctor know before you go for your appointment so that we can coordinate the best possible time for you in regards to your chemo regimen. You need to also let your dentist know that you are actively taking chemo. We may need to do labs prior to your dental appointment.  Skin Care Always use sunscreen that has not expired and with SPF (Sun  Protection Factor) of 50 or higher. Wear hats to protect your head from the sun. Remember to use sunscreen on your hands, ears, face, & feet.  Use good moisturizing lotions such as udder cream, eucerin, or even Vaseline. Some chemotherapies can cause dry skin, color changes in your skin and nails.    . Avoid long, hot showers or baths. . Use gentle, fragrance-free soaps and laundry detergent. . Use moisturizers, preferably creams or ointments rather than lotions because the thicker consistency is better at preventing skin dehydration. Apply the cream or ointment within 15 minutes of showering. Reapply moisturizer at night, and moisturize your hands every time after you wash them.  Hair  Loss (if your doctor says your hair will fall out)  . If your doctor says that your hair is likely to fall out, decide before you begin chemo whether you want to wear a wig. You may want to shop before treatment to match your hair color. . Hats, turbans, and scarves can also camouflage hair loss, although some people prefer to leave their heads uncovered. If you go bare-headed outdoors, be sure to use sunscreen on your scalp. . Cut your hair short. It eases the inconvenience of shedding lots of hair, but it also can reduce the emotional impact of watching your hair fall out. . Don't perm or color your hair during chemotherapy. Those chemical treatments are already damaging to hair and can enhance hair loss. Once your chemo treatments are done and your hair has grown back, it's OK to resume dyeing or perming hair. With chemotherapy, hair loss is almost always temporary. But when it grows back, it may be a different color or texture. In older adults who still had hair color before chemotherapy, the new growth may be completely gray.  Often, new hair is very fine and soft.  Infection Prevention Please wash your hands for at least 30 seconds using warm soapy water. Handwashing is the #1 way to prevent the spread of germs.  Stay away from sick people or people who are getting over a cold. If you develop respiratory systems such as green/yellow mucus production or productive cough or persistent cough let us know and we will see if you need an antibiotic. It is a good idea to keep a pair of gloves on when going into grocery stores/[employer] to decrease your risk of coming into contact with germs on the carts, etc. Carry alcohol hand gel with you at all times and use it frequently if out in public. If your temperature reaches 100.5 or higher please call the clinic and let us know.  If it is after hours or on the weekend please go to the ER if your temperature is over 100.5.  Please have your own personal thermometer at home to use.    Sex and bodily fluids If you are going to have sex, a condom must be used to protect the person that isn't taking chemotherapy. Chemo can decrease your libido (sex drive). For a few days after chemotherapy, chemotherapy can be excreted through your bodily fluids.  When using the toilet please close the lid and flush the toilet twice.  Do this for a few day after you have had chemotherapy.   Effects of chemotherapy on your sex life Some changes are simple and won't last long. They won't affect your sex life permanently. Sometimes you may feel: . too tired . not strong enough to be very active . sick or sore  . not in the mood . anxious or low Your anxiety might not seem related to sex. For example, you may be worried about the cancer and how your treatment is going. Or you may be worried about money, or about how you family are coping with your illness. These things can cause stress, which can affect your interest in sex. It's important to talk to your partner about how you feel. Remember - the changes to your sex life don't usually last long. There's usually no medical reason to stop having sex during chemo. The drugs won't have any long term physical effects on your performance or enjoyment of  sex. Cancer can't be passed on to your partner during  sex  Contraception It's important to use reliable contraception during treatment. Avoid getting pregnant while you or your partner are having chemotherapy. This is because the drugs may harm the baby. Sometimes chemotherapy drugs can leave a man or woman infertile.  This means you would not be able to have children in the future. You might want to talk to someone about permanent infertility. It can be very difficult to learn that you may no longer be able to have children. Some people find counselling helpful. There might be ways to preserve your fertility, although this is easier for men than for women. You may want to speak to a fertility expert. You can talk about sperm banking or harvesting your eggs. You can also ask about other fertility options, such as donor eggs. If you have or have had breast cancer, your doctor might advise you not to take the contraceptive pill. This is because the hormones in it might affect the cancer.  It is not known for sure whether or not chemotherapy drugs can be passed on through semen or secretions from the vagina. Because of this some doctors advise people to use a barrier method if you have sex during treatment. This applies to vaginal, anal or oral sex. Generally, doctors advise a barrier method only for the time you are actually having the treatment and for about a week after your treatment. Advice like this can be worrying, but this does not mean that you have to avoid being intimate with your partner. You can still have close contact with your partner and continue to enjoy sex.  Animals If you have cats or birds we just ask that you not change the litter or change the cage.  Please have someone else do this for you while you are on chemotherapy.   Food Safety During and After Cancer Treatment Food safety is important for people both during and after cancer treatment. Cancer and cancer treatments, such as  chemotherapy, radiation therapy, and stem cell/bone marrow transplantation, often weaken the immune system. This makes it harder for your body to protect itself from foodborne illness, also called food poisoning. Foodborne illness is caused by eating food that contains harmful bacteria, parasites, or viruses.  Foods to avoid Some foods have a higher risk of becoming tainted with bacteria. These include: Marland Kitchen Unwashed fresh fruit and vegetables, especially leafy vegetables that can hide dirt and other contaminants . Raw sprouts, such as alfalfa sprouts . Raw or undercooked beef, especially ground beef, or other raw or undercooked meat and poultry . Fatty, fried, or spicy foods immediately before or after treatment.  These can sit heavy on your stomach and make you feel nauseous. . Raw or undercooked shellfish, such as oysters. . Sushi and sashimi, which often contain raw fish.  . Unpasteurized beverages, such as unpasteurized fruit juices, raw milk, raw yogurt, or cider . Undercooked eggs, such as soft boiled, over easy, and poached; raw, unpasteurized eggs; or foods made with raw egg, such as homemade raw cookie dough and homemade mayonnaise Simple steps for food safety Shop smart. . Do not buy food stored or displayed in an unclean area. . Do not buy bruised or damaged fruits or vegetables. . Do not buy cans that have cracks, dents, or bulges. . Pick up foods that can spoil at the end of your shopping trip and store them in a cooler on the way home.   Prepare and clean up foods carefully. . Rinse all fresh fruits and vegetables  under running water, and dry them with a clean towel or paper towel. . Clean the top of cans before opening them. . After preparing food, wash your hands for 20 seconds with hot water and soap. Pay special attention to areas between fingers and under nails. . Clean your utensils and dishes with hot water and soap. Marland Kitchen Disinfect your kitchen and cutting boards using 1  teaspoon of liquid, unscented bleach mixed into 1 quart of water.   Dispose of old food. . Eat canned and packaged food before its expiration date (the "use by" or "best before" date). . Consume refrigerated leftovers within 3 to 4 days. After that time, throw out the food. Even if the food does not smell or look spoiled, it still may be unsafe. Some bacteria, such as Listeria, can grow even on foods stored in the refrigerator if they are kept for too long. Take precautions when eating out. . At restaurants, avoid buffets and salad bars where food sits out for a long time and comes in contact with many people. Food can become contaminated when someone with a virus, often a norovirus, or another "bug" handles it. . Put any leftover food in a "to-go" container yourself, rather than having the server do it. And, refrigerate leftovers as soon as you get home. . Choose restaurants that are clean and that are willing to prepare your food as you order it cooked.    MEDICATIONS:                                                                                                                                                          Zofran/Ondansetron 8mg  tablet. Take 1 tablet every 8 hours as needed for nausea/vomiting. (#1 nausea med to take, this can constipate)  Compazine/Prochlorperazine 10mg  tablet. Take 1 tablet every 6 hours as needed for nausea/vomiting. (#2 nausea med to take, this can make you sleepy)  EMLA cream. Apply a quarter size amount to port site 1 hour prior to chemo. Do not rub in. Cover with plastic wrap.   Over-the-Counter Meds: Miralax 1 capful in 8 oz of fluid daily. May increase to two times a day if needed. This is a stool softener. If this doesn't work proceed you can add:  Senokot S-start with 1 tablet two times a day and increase to 4 tablets two times a day if needed. (total of 8 tablets in a 24 hour period). This is a stimulant laxative.   Call us if this does not help your  bowels move.   Imodium 2mg  capsule. Take 2 capsules after the 1st loose stool and then 1 capsule every 2 hours until you go a total of 12 hours without having a loose stool. Call the [facility] if loose stools continue. If diarrhea occurs @ bedtime, take  2 capsules @bedtime . Then take 2 capsules every 4 hours until morning. Call [facility]. Diarrhea Sheet  If you are having loose stools/diarrhea, please purchase Imodium and begin taking as outlined:  At the first sign of poorly formed or loose stools you should begin taking Imodium(loperamide) 2 mg capsules.  Take two caplets (4mg ) followed by one caplet (2mg ) every 2 hours until you have had no diarrhea for 12 hours.  During the night take two caplets (4mg ) at bedtime and continue every 4 hours during the night until the morning.  Stop taking Imodium only after there is no sign of diarrhea for 12 hours.    Always call the [facility] if you are having loose stools/diarrhea that you can't get under control.  Loose stools/diarrhea leads to dehydration (loss of water) in your body.  We have other options of trying to get the loose stools/diarrhea to stopped but you must let us know!   Constipation Sheet *Miralax in 8 oz of fluid daily.  May increase to two times a day if needed.  This is a stool softener.  If this not enough to keep your bowel regular:  You can add:  *Senokot S, start with one tablet twice a day and can increase to 4 tablets twice a day if needed.  This is a stimulant laxative.   Sometimes when you take pain medication you need BOTH a medicine to keep your stool soft and a medicine to help your bowel push it out!  Please call if the above does not work for you.   Do not go more than 2 days without a bowel movement.  It is very important that you do not become constipated.  It will make you feel sick to your stomach (nausea) and can cause abdominal pain and vomiting.   Nausea Sheet  Zofran/Ondansetron 8mg  tablet. Take 1  tablet every 8 hours as needed for nausea/vomiting. (#1 nausea med to take, this can constipate)  Compazine/Prochlorperazine 10mg  tablet. Take 1 tablet every 6 hours as needed for nausea/vomiting. (#2 nausea med to take, this can make you sleepy)  You can take these medications together or separately.  We would first like for you to try the Ondansetron by itself and then take the Prochloperizine if needed. But you are allowed to take both medications at the same time if your nausea is that severe.  If you are having persistent nausea (nausea that does not stop) please take these medications on a staggered schedule so that the nausea medication stays in your body.  Please call the [facility] and let us know the amount of nausea that you are experiencing.  If you begin to vomit, you need to call the [facility] and if it is the weekend and you have vomited more than one time and cant get it to stop-go to the Emergency Room.  Persistent nausea/vomiting can lead to dehydration (loss of fluid in your body) and will make you feel terrible.   Ice chips, sips of clear liquids, foods that are @ room temperature, crackers, and toast tend to be better tolerated.    SYMPTOMS TO REPORT AS SOON AS POSSIBLE AFTER TREATMENT:  FEVER GREATER THAN 100.5 F  CHILLS WITH OR WITHOUT FEVER  NAUSEA AND VOMITING THAT IS NOT CONTROLLED WITH YOUR NAUSEA MEDICATION  UNUSUAL SHORTNESS OF BREATH  UNUSUAL BRUISING OR BLEEDING  TENDERNESS IN MOUTH AND THROAT WITH OR WITHOUT PRESENCE OF ULCERS  URINARY PROBLEMS  BOWEL PROBLEMS  UNUSUAL RASH  Wear comfortable clothing and clothing appropriate for easy access to any Portacath or PICC line. Let us know if there is anything that we can do to make your therapy better!    What to do if you need assistance after hours or on the weekends: CALL [phone].  HOLD on the line, do not hang up.  You will hear multiple messages but at the end you will be connected  with a nurse triage line.  They will contact the doctor if necessary.  Most of the time they will be able to assist you.  Do not call the hospital operator.       I have been informed and understand all of the instructions given to me and have received a copy. I have been instructed to call the clinic [phone] or my family physician as soon as possible for continued medical care, if indicated. I do not have any more questions at this time but understand that I may call the [facility] or the Patient Navigator at [phone] during office hours should I have questions or need assistance in obtaining follow-up care.

## 2018-01-12 NOTE — Progress Notes (Signed)
Chemotherapy teaching pulled together.  appts made. 

## 2018-01-13 ENCOUNTER — Other Ambulatory Visit (HOSPITAL_COMMUNITY): Payer: Self-pay | Admitting: Pharmacist

## 2018-01-14 ENCOUNTER — Ambulatory Visit: Payer: 59 | Admitting: Radiation Oncology

## 2018-01-14 ENCOUNTER — Ambulatory Visit: Payer: 59

## 2018-01-14 ENCOUNTER — Other Ambulatory Visit (HOSPITAL_COMMUNITY): Payer: Self-pay | Admitting: Pharmacist

## 2018-01-20 ENCOUNTER — Encounter (HOSPITAL_COMMUNITY): Payer: Self-pay

## 2018-01-20 ENCOUNTER — Other Ambulatory Visit: Payer: Self-pay

## 2018-01-20 ENCOUNTER — Inpatient Hospital Stay (HOSPITAL_COMMUNITY): Payer: 59

## 2018-01-20 ENCOUNTER — Inpatient Hospital Stay (HOSPITAL_BASED_OUTPATIENT_CLINIC_OR_DEPARTMENT_OTHER): Payer: 59 | Admitting: Internal Medicine

## 2018-01-20 ENCOUNTER — Encounter (HOSPITAL_COMMUNITY): Payer: Self-pay | Admitting: Internal Medicine

## 2018-01-20 ENCOUNTER — Inpatient Hospital Stay (HOSPITAL_COMMUNITY): Payer: 59 | Attending: Internal Medicine

## 2018-01-20 VITALS — BP 118/64 | HR 64 | Temp 97.9°F | Resp 18 | Wt 255.0 lb

## 2018-01-20 VITALS — BP 103/41 | HR 64 | Temp 98.1°F | Resp 18 | Wt 255.0 lb

## 2018-01-20 DIAGNOSIS — Z170 Estrogen receptor positive status [ER+]: Secondary | ICD-10-CM

## 2018-01-20 DIAGNOSIS — G4730 Sleep apnea, unspecified: Secondary | ICD-10-CM | POA: Diagnosis not present

## 2018-01-20 DIAGNOSIS — Z5112 Encounter for antineoplastic immunotherapy: Secondary | ICD-10-CM | POA: Insufficient documentation

## 2018-01-20 DIAGNOSIS — E119 Type 2 diabetes mellitus without complications: Secondary | ICD-10-CM | POA: Diagnosis not present

## 2018-01-20 DIAGNOSIS — Z87442 Personal history of urinary calculi: Secondary | ICD-10-CM

## 2018-01-20 DIAGNOSIS — Z79899 Other long term (current) drug therapy: Secondary | ICD-10-CM | POA: Insufficient documentation

## 2018-01-20 DIAGNOSIS — R011 Cardiac murmur, unspecified: Secondary | ICD-10-CM

## 2018-01-20 DIAGNOSIS — E785 Hyperlipidemia, unspecified: Secondary | ICD-10-CM

## 2018-01-20 DIAGNOSIS — Z90710 Acquired absence of both cervix and uterus: Secondary | ICD-10-CM

## 2018-01-20 DIAGNOSIS — C50211 Malignant neoplasm of upper-inner quadrant of right female breast: Secondary | ICD-10-CM | POA: Diagnosis present

## 2018-01-20 DIAGNOSIS — D649 Anemia, unspecified: Secondary | ICD-10-CM | POA: Insufficient documentation

## 2018-01-20 DIAGNOSIS — D696 Thrombocytopenia, unspecified: Secondary | ICD-10-CM | POA: Diagnosis not present

## 2018-01-20 DIAGNOSIS — Z801 Family history of malignant neoplasm of trachea, bronchus and lung: Secondary | ICD-10-CM

## 2018-01-20 DIAGNOSIS — J45909 Unspecified asthma, uncomplicated: Secondary | ICD-10-CM | POA: Diagnosis not present

## 2018-01-20 DIAGNOSIS — K219 Gastro-esophageal reflux disease without esophagitis: Secondary | ICD-10-CM | POA: Diagnosis not present

## 2018-01-20 DIAGNOSIS — I10 Essential (primary) hypertension: Secondary | ICD-10-CM | POA: Insufficient documentation

## 2018-01-20 DIAGNOSIS — R634 Abnormal weight loss: Secondary | ICD-10-CM

## 2018-01-20 LAB — COMPREHENSIVE METABOLIC PANEL
ALBUMIN: 3.9 g/dL (ref 3.5–5.0)
ALT: 22 U/L (ref 14–54)
AST: 21 U/L (ref 15–41)
Alkaline Phosphatase: 89 U/L (ref 38–126)
Anion gap: 10 (ref 5–15)
BILIRUBIN TOTAL: 0.5 mg/dL (ref 0.3–1.2)
BUN: 28 mg/dL — ABNORMAL HIGH (ref 6–20)
CHLORIDE: 105 mmol/L (ref 101–111)
CO2: 25 mmol/L (ref 22–32)
Calcium: 10.2 mg/dL (ref 8.9–10.3)
Creatinine, Ser: 0.86 mg/dL (ref 0.44–1.00)
GFR calc Af Amer: >60 mL/min (ref >60)
GLUCOSE: 136 mg/dL — AB (ref 65–99)
POTASSIUM: 4.4 mmol/L (ref 3.5–5.1)
Sodium: 140 mmol/L (ref 135–145)
TOTAL PROTEIN: 7.2 g/dL (ref 6.5–8.1)

## 2018-01-20 LAB — CBC WITH DIFFERENTIAL/PLATELET
BASOS ABS: 0 10*3/uL (ref 0.0–0.1)
Basophils Relative: 0 %
Eosinophils Absolute: 0.2 10*3/uL (ref 0.0–0.7)
Eosinophils Relative: 3 %
HCT: 37.3 % (ref 36.0–46.0)
Hemoglobin: 12.1 g/dL (ref 12.0–15.0)
LYMPHS PCT: 39 %
Lymphs Abs: 2 10*3/uL (ref 0.7–4.0)
MCH: 29.4 pg (ref 26.0–34.0)
MCHC: 32.4 g/dL (ref 30.0–36.0)
MCV: 90.5 fL (ref 78.0–100.0)
Monocytes Absolute: 0.5 10*3/uL (ref 0.1–1.0)
Monocytes Relative: 9 %
NEUTROS ABS: 2.6 10*3/uL (ref 1.7–7.7)
Neutrophils Relative %: 49 %
Platelets: 136 10*3/uL — ABNORMAL LOW (ref 150–400)
RBC: 4.12 MIL/uL (ref 3.87–5.11)
RDW: 14.2 % (ref 11.5–15.5)
WBC: 5.2 10*3/uL (ref 4.0–10.5)

## 2018-01-20 MED ORDER — SODIUM CHLORIDE 0.9 % IV SOLN
Freq: Once | INTRAVENOUS | Status: AC
  Administered 2018-01-20: 12:00:00 via INTRAVENOUS

## 2018-01-20 MED ORDER — ACETAMINOPHEN 325 MG PO TABS
650.0000 mg | ORAL_TABLET | Freq: Once | ORAL | Status: AC
  Administered 2018-01-20: 650 mg via ORAL

## 2018-01-20 MED ORDER — DIPHENHYDRAMINE HCL 25 MG PO CAPS
50.0000 mg | ORAL_CAPSULE | Freq: Once | ORAL | Status: AC
Start: 2018-01-20 — End: 2018-01-20
  Administered 2018-01-20: 50 mg via ORAL

## 2018-01-20 MED ORDER — ADO-TRASTUZUMAB EMTANSINE CHEMO INJECTION 160 MG
3.6000 mg/kg | Freq: Once | INTRAVENOUS | Status: AC
  Administered 2018-01-20: 420 mg via INTRAVENOUS
  Filled 2018-01-20: qty 5

## 2018-01-20 MED ORDER — DIPHENHYDRAMINE HCL 25 MG PO CAPS
ORAL_CAPSULE | ORAL | Status: AC
  Filled 2018-01-20: qty 2

## 2018-01-20 MED ORDER — SODIUM CHLORIDE 0.9% FLUSH
10.0000 mL | INTRAVENOUS | Status: DC | PRN
  Administered 2018-01-20: 10 mL
  Filled 2018-01-20: qty 10

## 2018-01-20 MED ORDER — ACETAMINOPHEN 325 MG PO TABS
ORAL_TABLET | ORAL | Status: AC
  Filled 2018-01-20: qty 2

## 2018-01-20 MED ORDER — HEPARIN SOD (PORK) LOCK FLUSH 100 UNIT/ML IV SOLN
500.0000 [IU] | Freq: Once | INTRAVENOUS | Status: AC | PRN
  Administered 2018-01-20: 500 [IU]

## 2018-01-20 NOTE — Patient Instructions (Signed)
[facility] at [facility] Discharge Instructions  Seen by Dr. Walden Field today MD F/U in 3 weeks with next chemo tx  Thank you for choosing [facility] at [facility] to provide your oncology and hematology care.  To afford each patient quality time with our provider, please arrive at least 15 minutes before your scheduled appointment time.   If you have a lab appointment with the [facility] please come in thru the  Main Entrance and check in at the main information desk  You need to re-schedule your appointment should you arrive 10 or more minutes late.  We strive to give you quality time with our providers, and arriving late affects you and other patients whose appointments are after yours.  Also, if you no show three or more times for appointments you may be dismissed from the clinic at the providers discretion.     Again, thank you for choosing [facility].  Our hope is that these requests will decrease the amount of time that you wait before being seen by our physicians.       _____________________________________________________________  Should you have questions after your visit to [facility], please contact our office at [phone] between the hours of 8:30 a.m. and 4:30 p.m.  Voicemails left after 4:30 p.m. will not be returned until the following business day.  For prescription refill requests, have your pharmacy contact our office.       Resources For Cancer Patients and their Caregivers ? American Cancer Society: Can assist with transportation, wigs, general needs, runs Look Good Feel Better.        (717)265-5420 ? Cancer Care: Provides financial assistance, online support groups, medication/co-pay assistance.  1-800-813-HOPE 380-192-8693) ? [facility] Assists [place] cancer patients and their families through emotional , educational and financial support.   (650)829-2515 ? [place] DSS Where to apply for food stamps, Medicaid and utility assistance. 646-409-9925 ? [facility]: Transportation to medical appointments. 915 562 7492 ? Social Security Administration: May apply for disability if have a Stage IV cancer. (458)084-5088 (567) 711-6260 ? [employer], Disability and Transit Services: Assists with nutrition, care and transit needs. [facility] Support Programs:   > Cancer Support Group  2nd Tuesday of the month 1pm-2pm, Journey Room   > Creative Journey  3rd Tuesday of the month 1130am-1pm, Journey Room

## 2018-01-20 NOTE — Progress Notes (Signed)
ECHO 11/02/2017  To treatment area for Kadcyla first treat.  Consent signed with teaching completed.  All questions asked and answered.    No complaints voiced today.  Denied neuropathy, bowel problems, SOB, or pain.  No s/s of distress noted.    Patient tolerated Kadcyla with no complaints voiced.  Port site clean and dry with no bruising or swelling noted at site. No complaints of pain with flush.  Band aid applied.  VSS with discharge and left ambulatory with no s/s of distress noted.

## 2018-01-20 NOTE — Patient Instructions (Signed)
[facility] Discharge Instructions for Patients Receiving Chemotherapy  Today you received the following chemotherapy agents kadcyla.    If you develop nausea and vomiting that is not controlled by your nausea medication, call the clinic.   BELOW ARE SYMPTOMS THAT SHOULD BE REPORTED IMMEDIATELY:  *FEVER GREATER THAN 100.5 F  *CHILLS WITH OR WITHOUT FEVER  NAUSEA AND VOMITING THAT IS NOT CONTROLLED WITH YOUR NAUSEA MEDICATION  *UNUSUAL SHORTNESS OF BREATH  *UNUSUAL BRUISING OR BLEEDING  TENDERNESS IN MOUTH AND THROAT WITH OR WITHOUT PRESENCE OF ULCERS  *URINARY PROBLEMS  *BOWEL PROBLEMS  UNUSUAL RASH Items with * indicate a potential emergency and should be followed up as soon as possible.  Feel free to call the clinic should you have any questions or concerns. The clinic phone number is [phone].  Please show the CHEMO ALERT CARD at check-in to the Emergency Department and triage nurse.   

## 2018-01-20 NOTE — Progress Notes (Signed)
Diagnosis Malignant neoplasm of upper-inner quadrant of right breast in female, estrogen receptor positive ([place]) - Plan: CBC with Differential/Platelet, Comprehensive metabolic panel, Lactate dehydrogenase, DISCONTINUED: 0.9 %  sodium chloride infusion, DISCONTINUED: sodium chloride flush (NS) 0.9 % injection 10 mL, DISCONTINUED: heparin lock flush 100 unit/mL, DISCONTINUED: acetaminophen (TYLENOL) tablet 650 mg, DISCONTINUED: diphenhydrAMINE (BENADRYL) capsule 50 mg, DISCONTINUED: ado-trastuzumab emtansine (KADCYLA) 420 mg in sodium chloride 0.9 % 250 mL chemo infusion  Staging Cancer Staging Malignant neoplasm of upper-inner quadrant of right breast in female, estrogen receptor positive ([place]) Staging form: Breast, AJCC 8th Edition - Clinical stage from 04/22/2017: Stage IB (cT2, cN0, cM0, G3, ER: Positive, PR: Positive, HER2: Positive) - Unsigned Staging comments: Staged at breast conference on 7.11.18   Assessment and Plan:  1.  pT1cN1a Stage IIA  invasive ductal carcinoma of (R) breast; ER+/PR+/HER2+.  Pt had screening mammogram in 03/2017 for (R) breast abnormality. Diagnostic mammogram/ultrasound of (R) breast suspicious for malignancy. Underwent (R) breast biopsy on 04/13/17 revealing invasive ductal carcinoma, grade 3, ER+, PR+, HER2+. Seen initially in consultation with Dr. Lindi Adie at [facility], and patient elected to have her treatment closer to home and presented to [facility] for cycle #1 neoadjuvant chemo with [facility] on 05/29/17. Marland Kitchen   Pt completed neoadjuvant [facility] chemo on 10/09/17. She was being followed by NP Renato Battles and Dr. Sherrine Maples.   She was scheduled to see her surgeon, Dr. Lucia Gaskins, but she had to reschedule this appointment due to  recent injury that her husband sustained.  It is very important to her that her husband be present for this appointment.    Treatment breast MRI showed only partial response to both the primary breast mass and axillary adenopathy. MRI done 11/11/2017 showed  right breast lesion now measuring 2.3 cm previously 2.7 cm per MRI report.  ECHO done 11/02/2017 showed normal EF 60-65%.  She has undergone right lumpectomy on 12/09/2017.  Lesion #1 of the right breast was intraductal papilloma.  There was no evidence of malignancy.  Lesion to of the right breast showed invasive ductal carcinoma grade 3 measuring 2 cm and  there was evidence of high-grade DCIS.  She had one lymph node positive for disease.  Nipple biopsy on the right showed no malignancy.  Margins were greater than 0.2 cm in all final margins.  Her tumor was ER +100% PR +70% HER-2 was positive by FISH with a ratio of 2.37.  Ki 67 was 60%.  She had evidence of residual disease with one positive lymph node on definitive surgery that was done on December 09, 2017.  She is recommended for adjuvant endocrine therapy with Femara 2.5 mg p.o. daily and can start the medication with RT and Kadcyla.   Options discussed  due to residual disease on adjuvant Herceptin and Perjeta would be to switch to Kadcyla  3.6 mg/kg every 3 weeks for 14 cycles based on results of the recent  [facility].  Pt is here today for C1 of Kadcyla.  She will continue to have chemistries and CBC monitored as well as ECHO evaluation every 3 months.  She should notify the office if she has any problems prior to her next treatment.  Labs are adequate for chemotherapy.  She will RTC in 3 weeks for follow-up prior to C2.  She should follow-up with RT as directed.  Therapy will proceed with RT and Endocrine therapy.  She was previously recommended for bone density evaluation.    2.  Anemia.  Hgb 12  on  recent labs.  Will continue to monitor labs as therapy proceeds.    3.  Thrombocytopenia.  Plts 136,000 on recent labs.  Will repeat CBC on RTC.    Current Status:  Pt seen today for follow-up.  She is here for evaluation prior to C1 of Kadcyla.      Malignant neoplasm of upper-inner quadrant of right breast in female, estrogen receptor  positive ([place])   04/13/2017 Initial Diagnosis    Screening detected right breast mass with calcifications 2.1 cm in size and 3.4 cm in size. Calcifications were fibroadenoma. Right breast biopsy upper inner quadrant 1:00 mass: IDC with DCIS with necrosis, grade 3, ER 100%, PR 70%, Ki-67 60%, HER-2 positive ratio 2.34, T2 N1 stage IB (New AJCC)      04/25/2017 Breast MRI    Right breast upper inner quadrant 2.2 x 1.7 x 2.7 cm mass, abnormal area of clumped non-mass enhancement in the right lateral breast 3.2 x 1.9 x 1 cm, too abnormal lymph nodes right axilla 4.3 and 1.7 cm (biopsy-proven breast cancer)       05/08/2017 Procedure    Right axilla lymph node biopsy: Metastatic carcinoma      05/13/2017 Procedure    Right breast biopsy retroareolar region: Intraductal papilloma      01/13/2018 -  Chemotherapy    The patient had ado-trastuzumab emtansine (KADCYLA) 420 mg in sodium chloride 0.9 % 250 mL chemo infusion, 3.6 mg/kg = 420 mg, Intravenous, Once, 1 of 5 cycles  for chemotherapy treatment.         Problem List Patient Active Problem List   Diagnosis Date Noted  . Weight loss [R63.4] 06/13/2017  . Malignant neoplasm of upper-inner quadrant of right breast in female, estrogen receptor positive ([place]) [C50.211, Z17.0] 04/16/2017    Past Medical History Past Medical History:  Diagnosis Date  . Asthma   . Blood transfusion    as child  . Cancer ([place])   . Diabetes ([place])    type 2   . GERD (gastroesophageal reflux disease)   . Headache   . Heart murmur    d/t aortic stenosis   . History of kidney stones   . Hyperlipidemia   . Hypertension   . Normal echocardiogram   . Sleep apnea     Past Surgical History Past Surgical History:  Procedure Laterality Date  . ABDOMINAL HYSTERECTOMY  2004  . BREAST LUMPECTOMY WITH RADIOACTIVE SEED AND SENTINEL LYMPH NODE BIOPSY Right 12/09/2017   Procedure: RIGHT BREAST LUMPECTOMY WITH RADIOACTIVE SEED AND SENTINEL LYMPH NODE BIOPSY AND  RADIOACTIVE SEED TARGETED RIGHT AXILLARY LYMPH NODE EXCISION;  Surgeon: Alphonsa Overall, MD;  Location: [facility];  Service: General;  Laterality: Right;  . CHOLECYSTECTOMY  05/30/2011   Procedure: LAPAROSCOPIC CHOLECYSTECTOMY;  Surgeon: Donato Heinz;  Location: [facility];  Service: General;  Laterality: N/A;  . 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 KIDNEY STONE SURGERY     2017 2-23  . PORTACATH PLACEMENT N/A 05/25/2017   Procedure: INSERTION PORT-A-CATH;  Surgeon: Alphonsa Overall, MD;  Location: [facility];  Service: General;  Laterality: N/A;    Family History Family History  Problem Relation Age of Onset  . Lung cancer Maternal Grandmother   . Anesthesia problems Neg Hx   . Hypotension Neg Hx   . Malignant hyperthermia Neg Hx   . Pseudochol deficiency Neg Hx      Social History  reports that she has  never smoked. She has never used smokeless tobacco. She reports that she drinks alcohol. She reports that she does not use drugs.  Medications  Current Outpatient Medications:  .  acetaminophen (TYLENOL) 500 MG tablet, Take 1,000 mg by mouth daily as needed for moderate pain or headache., Disp: , Rfl:  .  Ado-Trastuzumab Emtansine (KADCYLA IV), Inject into the vein., Disp: , Rfl:  .  albuterol (PROVENTIL HFA;VENTOLIN HFA) 108 (90 BASE) MCG/ACT inhaler, Inhale 1 puff into the lungs every 6 (six) hours as needed for wheezing or shortness of breath. , Disp: , Rfl:  .  cephALEXin (KEFLEX) 500 MG capsule, Take 1 capsule (500 mg total) by mouth 3 (three) times daily., Disp: 30 capsule, Rfl: 0 .  diphenhydrAMINE (BENADRYL) 25 mg capsule, Take 25 mg by mouth at bedtime as needed for allergies. , Disp: , Rfl:  .  escitalopram (LEXAPRO) 20 MG tablet, Take 20 mg by mouth at bedtime. , Disp: , Rfl:  .  fenofibrate 160 MG tablet, Take 160 mg by mouth at bedtime. , Disp: , Rfl:  .  fluticasone (FLONASE) 50 MCG/ACT nasal spray, Place 1 spray into both nostrils daily as needed for  allergies or rhinitis., Disp: , Rfl:  .  HYDROcodone-acetaminophen (NORCO/VICODIN) 5-325 MG tablet, Take 1 tablet by mouth every 6 (six) hours as needed for moderate pain., Disp: 20 tablet, Rfl: 0 .  letrozole (FEMARA) 2.5 MG tablet, Take 1 tablet (2.5 mg total) by mouth daily., Disp: 30 tablet, Rfl: 6 .  lidocaine-prilocaine (EMLA) cream, Apply 1 application topically daily as needed (port access). , Disp: , Rfl:  .  loperamide (IMODIUM A-D) 2 MG tablet, Take 4 mg by mouth as needed for diarrhea or loose stools., Disp: , Rfl:  .  losartan (COZAAR) 50 MG tablet, Take 25 mg by mouth at bedtime. , Disp: , Rfl:  .  magic mouthwash w/lidocaine SOLN, Take 5 mLs by mouth 4 (four) times daily as needed for mouth pain., Disp: 360 mL, Rfl: 1 .  Multiple Vitamin (MULTIVITAMIN) tablet, Take 1 tablet by mouth at bedtime. , Disp: , Rfl:  .  neomycin-bacitracin-polymyxin (NEOSPORIN) ointment, Apply 1 application topically as needed for wound care., Disp: , Rfl:  .  omeprazole (PRILOSEC) 40 MG capsule, Take 1 capsule (40 mg total) by mouth daily. (Patient taking differently: Take 40 mg by mouth daily as needed (heartburn). ), Disp: 30 capsule, Rfl: 5 .  prochlorperazine (COMPAZINE) 10 MG tablet, TAKE (1) TABLET BY MOUTH EVERY SIX HOURS AS NEEDED FOR NAUSEA AND VOMITING., Disp: 30 tablet, Rfl: 0 .  diphenoxylate-atropine (LOMOTIL) 2.5-0.025 MG tablet, TAKE (1) TABLET BY MOUTH (4) TIMES DAILY AS NEEDED FOR DIARRHEA OR LOOSE STOOLS. (Patient not taking: Reported on 01/20/2018), Disp: 45 tablet, Rfl: 0 No current facility-administered medications for this visit.   Facility-Administered Medications Ordered in Other Visits:  .  heparin lock flush 100 unit/mL, 500 Units, Intracatheter, Once PRN, Amarachi Kotz, MD .  sodium chloride flush (NS) 0.9 % injection 10 mL, 10 mL, Intracatheter, PRN, Michaela Shankel, MD, 10 mL at 01/20/18 0945  Allergies Latex; Pyridostigmine bromide; and Statins  Review of Systems Review of  Systems - Oncology ROS as per HPI otherwise 12 point ROS is negative.   Physical Exam  Vitals Wt Readings from Last 3 Encounters:  01/20/18 255 lb (115.7 kg)  01/20/18 255 lb (115.7 kg)  01/12/18 251 lb 9.6 oz (114.1 kg)   Temp Readings from Last 3 Encounters:  01/20/18 97.9 F (36.6  C) (Oral)  01/20/18 98.1 F (36.7 C) (Oral)  01/12/18 97.7 F (36.5 C) (Oral)   BP Readings from Last 3 Encounters:  01/20/18 118/64  01/20/18 (!) 103/41  01/12/18 (!) 128/91   Pulse Readings from Last 3 Encounters:  01/20/18 64  01/20/18 64  01/12/18 64   Constitutional: Well-developed, well-nourished, and in no distress.   HENT: Head: Normocephalic and atraumatic.  Mouth/Throat: No oropharyngeal exudate. Mucosa moist. Eyes: Pupils are equal, round, and reactive to light. Conjunctivae are normal. No scleral icterus.  Neck: Normal range of motion. Neck supple. No JVD present.  Cardiovascular: Normal rate, regular rhythm and normal heart sounds.  Exam reveals no gallop and no friction rub.   No murmur heard. Pulmonary/Chest: Effort normal and breath sounds normal. No respiratory distress. No wheezes.No rales.  Abdominal: Soft. Bowel sounds are normal. No distension. There is no tenderness. There is no guarding.  Musculoskeletal: No edema or tenderness.  Lymphadenopathy: No cervical, axillary or supraclavicular adenopathy.  Neurological: Alert and oriented to person, place, and time. No cranial nerve deficit.  Skin: Skin is warm and dry. No rash noted. No erythema. No pallor.  Psychiatric: Affect and judgment normal.  Bilateral breast exam:  Right lumpectomy noted.  Crusted area over nipple noted on right is improved.   Left breast showed no palpable masses.    Labs Infusion on 01/20/2018  Component Date Value Ref Range Status  . Sodium 01/20/2018 140  135 - 145 mmol/L Final  . Potassium 01/20/2018 4.4  3.5 - 5.1 mmol/L Final  . Chloride 01/20/2018 105  101 - 111 mmol/L Final  . CO2  01/20/2018 25  22 - 32 mmol/L Final  . Glucose, Bld 01/20/2018 136* 65 - 99 mg/dL Final  . BUN 01/20/2018 28* 6 - 20 mg/dL Final  . Creatinine, Ser 01/20/2018 0.86  0.44 - 1.00 mg/dL Final  . Calcium 01/20/2018 10.2  8.9 - 10.3 mg/dL Final  . Total Protein 01/20/2018 7.2  6.5 - 8.1 g/dL Final  . Albumin 01/20/2018 3.9  3.5 - 5.0 g/dL Final  . AST 01/20/2018 21  15 - 41 U/L Final  . ALT 01/20/2018 22  14 - 54 U/L Final  . Alkaline Phosphatase 01/20/2018 89  38 - 126 U/L Final  . Total Bilirubin 01/20/2018 0.5  0.3 - 1.2 mg/dL Final  . GFR calc non Af Amer 01/20/2018 >60  >60 mL/min Final  . GFR calc Af Amer 01/20/2018 >60  >60 mL/min Final   Comment: (NOTE) The eGFR has been calculated using the CKD EPI equation. This calculation has not been validated in all clinical situations. eGFR's persistently <60 mL/min signify possible Chronic Kidney Disease.   Georgiann Hahn gap 01/20/2018 10  5 - 15 Final   Performed at [facility], [address] [place], [place] [address]  . WBC 01/20/2018 5.2  4.0 - 10.5 K/uL Final  . RBC 01/20/2018 4.12  3.87 - 5.11 MIL/uL Final  . Hemoglobin 01/20/2018 12.1  12.0 - 15.0 g/dL Final  . HCT 01/20/2018 37.3  36.0 - 46.0 % Final  . MCV 01/20/2018 90.5  78.0 - 100.0 fL Final  . MCH 01/20/2018 29.4  26.0 - 34.0 pg Final  . MCHC 01/20/2018 32.4  30.0 - 36.0 g/dL Final  . RDW 01/20/2018 14.2  11.5 - 15.5 % Final  . Platelets 01/20/2018 136* 150 - 400 K/uL Final  . Neutrophils Relative % 01/20/2018 49  % Final  . Neutro Abs 01/20/2018 2.6  1.7 -  7.7 K/uL Final  . Lymphocytes Relative 01/20/2018 39  % Final  . Lymphs Abs 01/20/2018 2.0  0.7 - 4.0 K/uL Final  . Monocytes Relative 01/20/2018 9  % Final  . Monocytes Absolute 01/20/2018 0.5  0.1 - 1.0 K/uL Final  . Eosinophils Relative 01/20/2018 3  % Final  . Eosinophils Absolute 01/20/2018 0.2  0.0 - 0.7 K/uL Final  . Basophils Relative 01/20/2018 0  % Final  . Basophils Absolute 01/20/2018 0.0  0.0 - 0.1 K/uL Final    Performed at [facility], [address], [place], [place] [address]     Pathology Orders Placed This Encounter  Procedures  . CBC with Differential/Platelet    Standing Status:   Future    Standing Expiration Date:   01/21/2019  . Comprehensive metabolic panel    Standing Status:   Future    Standing Expiration Date:   01/21/2019  . Lactate dehydrogenase    Standing Status:   Future    Standing Expiration Date:   01/21/2019       Zoila Shutter MD

## 2018-01-21 ENCOUNTER — Encounter (HOSPITAL_COMMUNITY): Payer: Self-pay | Admitting: *Deleted

## 2018-01-21 NOTE — Progress Notes (Signed)
Spoke with pt for 24 hour f/u after first Kadcyla infusion.  Denies any s/s, states, "I'm doing good.  I feel alright.". Instructed pt to call the clinic with any questions or concerns, or with the onset of any new s/s should she have them. Verbalizes understanding.

## 2018-02-01 ENCOUNTER — Encounter (HOSPITAL_COMMUNITY): Payer: Self-pay | Admitting: *Deleted

## 2018-02-01 ENCOUNTER — Ambulatory Visit (HOSPITAL_COMMUNITY)
Admission: RE | Admit: 2018-02-01 | Discharge: 2018-02-01 | Disposition: A | Discharge status: Home / Self Care | Payer: 59 | Source: Ambulatory Visit | Attending: Internal Medicine | Admitting: Internal Medicine

## 2018-02-01 DIAGNOSIS — R011 Cardiac murmur, unspecified: Secondary | ICD-10-CM | POA: Diagnosis not present

## 2018-02-01 DIAGNOSIS — I350 Nonrheumatic aortic (valve) stenosis: Secondary | ICD-10-CM | POA: Diagnosis not present

## 2018-02-01 DIAGNOSIS — C50211 Malignant neoplasm of upper-inner quadrant of right female breast: Secondary | ICD-10-CM

## 2018-02-01 DIAGNOSIS — Z170 Estrogen receptor positive status [ER+]: Secondary | ICD-10-CM | POA: Insufficient documentation

## 2018-02-01 DIAGNOSIS — I10 Essential (primary) hypertension: Secondary | ICD-10-CM | POA: Insufficient documentation

## 2018-02-01 DIAGNOSIS — E119 Type 2 diabetes mellitus without complications: Secondary | ICD-10-CM | POA: Diagnosis not present

## 2018-02-01 DIAGNOSIS — E785 Hyperlipidemia, unspecified: Secondary | ICD-10-CM | POA: Diagnosis not present

## 2018-02-01 NOTE — Progress Notes (Signed)
Patient called into [facility] stating that she thinks she is having side effects from the Femara. Patient stated that she began having heaviness in her left arm that moved into her right arm, then midline sternum, and down/across abdomen. She did not take last night's (01/31/18) dosage and states that today 02/01/18 she feels 60% better. She said she can't explain it other than she feels like it is a heaviness. She said she spent yesterday in the bed due to the symptoms. She started Femara on 01/20/18. She also started Kadcyla on this date. Dr. Tomie China advice was that she continue taking the Femara and to take Ibuprofen or Tylenol to see if this helps with the musculoskeletal symptoms. He said 50% of patients developed this side effect and to try and push through. He said if symptoms weren't better by next office visit that they could discuss switching her to a different medication. I gave the patient all of this information and she said okay to continuing on the dosage. I told her that we did not want her in bed everyday due to musculoskeletal pain/symptoms so to call us if she continued to worsen or didn't improve prior to her OV on 02/10/18. She said okay.

## 2018-02-01 NOTE — Progress Notes (Signed)
*  PRELIMINARY RESULTS* Echocardiogram 2D Echocardiogram has been performed.  Leavy Cella 02/01/2018, 11:07 AM

## 2018-02-02 ENCOUNTER — Ambulatory Visit
Admission: RE | Admit: 2018-02-02 | Discharge: 2018-02-02 | Disposition: A | Discharge status: Home / Self Care | Payer: 59 | Source: Ambulatory Visit | Attending: Radiation Oncology | Admitting: Radiation Oncology

## 2018-02-02 DIAGNOSIS — Z170 Estrogen receptor positive status [ER+]: Secondary | ICD-10-CM | POA: Insufficient documentation

## 2018-02-02 DIAGNOSIS — Z510 Encounter for antineoplastic radiation therapy: Secondary | ICD-10-CM | POA: Insufficient documentation

## 2018-02-02 DIAGNOSIS — C50211 Malignant neoplasm of upper-inner quadrant of right female breast: Secondary | ICD-10-CM | POA: Insufficient documentation

## 2018-02-07 NOTE — Progress Notes (Signed)
  Radiation Oncology         [phone]) [phone] ________________________________  Name: Renee Harris MRN: [id number]  Date: 02/02/2018  DOB: Oct 28, 1960  DIAGNOSIS:     ICD-10-CM   1. Malignant neoplasm of upper-inner quadrant of right breast in female, estrogen receptor positive ([place]) C50.211    Z17.0      SIMULATION AND TREATMENT PLANNING NOTE  The patient presented for simulation prior to beginning her course of radiation treatment for her diagnosis of right-sided breast cancer. The patient was placed in a supine position on a breast board. A customized vac-lock bag was also constructed and this complex treatment device will be used on a daily basis during her treatment. In this fashion, a CT scan was obtained through the chest area and an isocenter was placed near the chest wall at the upper aspect of the right chest.  The patient will be planned to receive a course of radiation initially to a dose of 50.4 gray. This will consist of a 4 field technique targeting the right chest wall as well as the supraclavicular region. Therefore 2 customized medial and lateral tangent fields have been created targeting the right breast, and also 2 additional customized fields have been designed to treat the supraclavicular region both with a right supraclavicular field and a right posterior axillary boost field. A forward planning/reduced field technique will also be evaluated to determine if this significantly improves the dose homogeneity of the overall plan. Therefore, additional customized blocks/fields may be necessary.  This initial treatment will be accomplished at 1.8 gray per fraction.   The initial plan will consist of a 3-D conformal technique. The target volume/scar, heart and lungs have been contoured and dose volume histograms of each of these structures will be evaluated as part of the 3-D conformal treatment planning process.   It is anticipated that the patient will then receive a 10 gray  boost to the surgical scar. This will be accomplished at 2 gray per fraction. The final anticipated total dose therefore will correspond to 60.4 gray.    _______________________________   Jodelle Gross, MD, PhD

## 2018-02-07 NOTE — Progress Notes (Signed)
  Radiation Oncology         [phone]) [phone] ________________________________  Name: SERETHA ESTABROOKS MRN: [id number]  Date: 02/02/2018  DOB: 1961-01-07  Optical Surface Tracking Plan:  Since intensity modulated radiotherapy (IMRT) and 3D conformal radiation treatment methods are predicated on accurate and precise positioning for treatment, intrafraction motion monitoring is medically necessary to ensure accurate and safe treatment delivery.  The ability to quantify intrafraction motion without excessive ionizing radiation dose can only be performed with optical surface tracking. Accordingly, surface imaging offers the opportunity to obtain 3D measurements of patient position throughout IMRT and 3D treatments without excessive radiation exposure.  I am ordering optical surface tracking for this patient's upcoming course of radiotherapy. ________________________________  Kyung Rudd, MD 02/07/2018 2:48 PM    Reference:   Particia Jasper, et al. Surface imaging-based analysis of intrafraction motion for breast radiotherapy patients.Journal of [place], n. 6, nov. 2014. ISSN [id number].   Available at: <http://www.jacmp.org/index.php/jacmp/article/view/4957>.

## 2018-02-10 ENCOUNTER — Ambulatory Visit (HOSPITAL_COMMUNITY): Payer: 59 | Admitting: Internal Medicine

## 2018-02-10 ENCOUNTER — Ambulatory Visit (HOSPITAL_COMMUNITY): Payer: 59

## 2018-02-10 ENCOUNTER — Other Ambulatory Visit: Payer: Self-pay

## 2018-02-10 ENCOUNTER — Inpatient Hospital Stay (HOSPITAL_COMMUNITY): Payer: 59

## 2018-02-10 ENCOUNTER — Encounter (HOSPITAL_COMMUNITY): Payer: Self-pay

## 2018-02-10 ENCOUNTER — Inpatient Hospital Stay (HOSPITAL_COMMUNITY): Payer: 59 | Attending: Internal Medicine | Admitting: Internal Medicine

## 2018-02-10 VITALS — BP 119/75 | HR 70 | Temp 98.0°F | Resp 18 | Wt 252.6 lb

## 2018-02-10 VITALS — BP 122/46 | HR 63 | Temp 98.0°F | Resp 18 | Wt 252.6 lb

## 2018-02-10 DIAGNOSIS — Z170 Estrogen receptor positive status [ER+]: Secondary | ICD-10-CM | POA: Diagnosis not present

## 2018-02-10 DIAGNOSIS — C50211 Malignant neoplasm of upper-inner quadrant of right female breast: Secondary | ICD-10-CM | POA: Insufficient documentation

## 2018-02-10 DIAGNOSIS — Z510 Encounter for antineoplastic radiation therapy: Secondary | ICD-10-CM | POA: Insufficient documentation

## 2018-02-10 DIAGNOSIS — C773 Secondary and unspecified malignant neoplasm of axilla and upper limb lymph nodes: Secondary | ICD-10-CM | POA: Diagnosis not present

## 2018-02-10 DIAGNOSIS — Z5111 Encounter for antineoplastic chemotherapy: Secondary | ICD-10-CM | POA: Insufficient documentation

## 2018-02-10 LAB — CBC WITH DIFFERENTIAL/PLATELET
Basophils Absolute: 0 10*3/uL (ref 0.0–0.1)
Basophils Relative: 0 %
EOS ABS: 0.1 10*3/uL (ref 0.0–0.7)
EOS PCT: 2 %
HCT: 38.5 % (ref 36.0–46.0)
Hemoglobin: 12.3 g/dL (ref 12.0–15.0)
Lymphocytes Relative: 38 %
Lymphs Abs: 2.2 10*3/uL (ref 0.7–4.0)
MCH: 28.1 pg (ref 26.0–34.0)
MCHC: 31.9 g/dL (ref 30.0–36.0)
MCV: 88.1 fL (ref 78.0–100.0)
MONO ABS: 0.5 10*3/uL (ref 0.1–1.0)
MONOS PCT: 9 %
Neutro Abs: 2.9 10*3/uL (ref 1.7–7.7)
Neutrophils Relative %: 51 %
Platelets: 157 10*3/uL (ref 150–400)
RBC: 4.37 MIL/uL (ref 3.87–5.11)
RDW: 13.8 % (ref 11.5–15.5)
WBC: 5.8 10*3/uL (ref 4.0–10.5)

## 2018-02-10 LAB — LACTATE DEHYDROGENASE: LDH: 120 U/L (ref 98–192)

## 2018-02-10 LAB — COMPREHENSIVE METABOLIC PANEL
ALBUMIN: 3.9 g/dL (ref 3.5–5.0)
ALT: 41 U/L (ref 14–54)
AST: 50 U/L — AB (ref 15–41)
Alkaline Phosphatase: 102 U/L (ref 38–126)
Anion gap: 9 (ref 5–15)
BUN: 19 mg/dL (ref 6–20)
CHLORIDE: 103 mmol/L (ref 101–111)
CO2: 26 mmol/L (ref 22–32)
CREATININE: 0.83 mg/dL (ref 0.44–1.00)
Calcium: 10.6 mg/dL — ABNORMAL HIGH (ref 8.9–10.3)
GFR calc Af Amer: >60 mL/min (ref >60)
GFR calc non Af Amer: >60 mL/min (ref >60)
GLUCOSE: 145 mg/dL — AB (ref 65–99)
Potassium: 3.7 mmol/L (ref 3.5–5.1)
SODIUM: 138 mmol/L (ref 135–145)
Total Bilirubin: 0.7 mg/dL (ref 0.3–1.2)
Total Protein: 7.6 g/dL (ref 6.5–8.1)

## 2018-02-10 MED ORDER — DIPHENHYDRAMINE HCL 25 MG PO CAPS
ORAL_CAPSULE | ORAL | Status: AC
  Filled 2018-02-10: qty 2

## 2018-02-10 MED ORDER — SODIUM CHLORIDE 0.9 % IV SOLN
Freq: Once | INTRAVENOUS | Status: AC
  Administered 2018-02-10: 11:00:00 via INTRAVENOUS

## 2018-02-10 MED ORDER — ADO-TRASTUZUMAB EMTANSINE CHEMO INJECTION 160 MG
3.6000 mg/kg | Freq: Once | INTRAVENOUS | Status: AC
  Administered 2018-02-10: 420 mg via INTRAVENOUS
  Filled 2018-02-10: qty 16

## 2018-02-10 MED ORDER — HEPARIN SOD (PORK) LOCK FLUSH 100 UNIT/ML IV SOLN
500.0000 [IU] | Freq: Once | INTRAVENOUS | Status: AC | PRN
  Administered 2018-02-10: 500 [IU]

## 2018-02-10 MED ORDER — ACETAMINOPHEN 325 MG PO TABS
ORAL_TABLET | ORAL | Status: AC
  Filled 2018-02-10: qty 2

## 2018-02-10 MED ORDER — ACETAMINOPHEN 325 MG PO TABS
650.0000 mg | ORAL_TABLET | Freq: Once | ORAL | Status: AC
  Administered 2018-02-10: 650 mg via ORAL

## 2018-02-10 MED ORDER — SODIUM CHLORIDE 0.9% FLUSH
10.0000 mL | INTRAVENOUS | Status: DC | PRN
  Administered 2018-02-10: 10 mL
  Filled 2018-02-10: qty 10

## 2018-02-10 MED ORDER — DIPHENHYDRAMINE HCL 25 MG PO CAPS
50.0000 mg | ORAL_CAPSULE | Freq: Once | ORAL | Status: AC
  Administered 2018-02-10: 50 mg via ORAL

## 2018-02-10 NOTE — Patient Instructions (Signed)
[facility]  Discharge Instructions:   Today you saw Dr. Higgs.    _______________________________________________________________  Thank you for choosing [facility] to provide your oncology and hematology care.  To afford each patient quality time with our providers, please arrive at least 15 minutes before your scheduled appointment.  You need to re-schedule your appointment if you arrive 10 or more minutes late.  We strive to give you quality time with our providers, and arriving late affects you and other patients whose appointments are after yours.  Also, if you no show three or more times for appointments you may be dismissed from the clinic.  Again, thank you for choosing [facility]. Our hope is that these requests will allow you access to exceptional care and in a timely manner. _______________________________________________________________  If you have questions after your visit, please contact our office at [phone] between the hours of 8:30 a.m. and 5:00 p.m. Voicemails left after 4:30 p.m. will not be returned until the following business day. _______________________________________________________________  For prescription refill requests, have your pharmacy contact our office. _______________________________________________________________  Recommendations made by the consultant and any test results will be sent to your referring physician. _______________________________________________________________ 

## 2018-02-10 NOTE — Patient Instructions (Signed)
[facility] Discharge Instructions for Patients Receiving Chemotherapy   Beginning January 23rd 2017 lab work for the Cancer Center will be done in the  Main lab at [facility] on 1st floor. If you have a lab appointment with the Cancer Center please come in thru the  Main Entrance and check in at the main information desk   Today you received the following chemotherapy agents Kadcyla. Follow-up as scheduled. Call clinic for any questions or concerns  To help prevent nausea and vomiting after your treatment, we encourage you to take your nausea medication   If you develop nausea and vomiting, or diarrhea that is not controlled by your medication, call the clinic.  The clinic phone number is [phone]. Office hours are Monday-Friday 8:30am-5:00pm.  BELOW ARE SYMPTOMS THAT SHOULD BE REPORTED IMMEDIATELY:  *FEVER GREATER THAN 101.0 F  *CHILLS WITH OR WITHOUT FEVER  NAUSEA AND VOMITING THAT IS NOT CONTROLLED WITH YOUR NAUSEA MEDICATION  *UNUSUAL SHORTNESS OF BREATH  *UNUSUAL BRUISING OR BLEEDING  TENDERNESS IN MOUTH AND THROAT WITH OR WITHOUT PRESENCE OF ULCERS  *URINARY PROBLEMS  *BOWEL PROBLEMS  UNUSUAL RASH Items with * indicate a potential emergency and should be followed up as soon as possible. If you have an emergency after office hours please contact your primary care physician or go to the nearest emergency department.  Please call the clinic during office hours if you have any questions or concerns.   You may also contact the Patient Navigator at [phone] should you have any questions or need assistance in obtaining follow up care.      Resources For Cancer Patients and their Caregivers ? American Cancer Society: Can assist with transportation, wigs, general needs, runs Look Good Feel Better.        1-888-227-6333 ? Cancer Care: Provides financial assistance, online support groups, medication/co-pay assistance.  1-800-813-HOPE  (4673) ? [facility] Assists [place] cancer patients and their families through emotional , educational and financial support.  336-427-4357 ? [place] DSS Where to apply for food stamps, Medicaid and utility assistance. 336-342-1394 ? [facility]: Transportation to medical appointments. 336-347-2287 ? Social Security Administration: May apply for disability if have a Stage IV cancer. 336-342-7796 1-800-772-1213 ? [place] Aging, Disability and Transit Services: Assists with nutrition, care and transit needs. 336-349-2343         

## 2018-02-10 NOTE — Progress Notes (Signed)
Diagnosis Malignant neoplasm of upper-inner quadrant of right female breast, unspecified estrogen receptor status ([place]) - Plan: CBC with Differential/Platelet, Comprehensive metabolic panel, Lactate dehydrogenase  Staging Cancer Staging Malignant neoplasm of upper-inner quadrant of right breast in female, estrogen receptor positive ([place]) Staging form: Breast, AJCC 8th Edition - Clinical stage from 04/22/2017: Stage IB (cT2, cN0, cM0, G3, ER: Positive, PR: Positive, HER2: Positive) - Unsigned Staging comments: Staged at breast conference on 7.11.18   Assessment and Plan:  1.  pT1cN1a Stage IIA  invasive ductal carcinoma of (R) breast; ER+/PR+/HER2+. Pt had evidence of residual disease with one positive lymph node on definitive surgery that was done on December 09, 2017.  She is recommended for adjuvant endocrine therapy with Femara 2.5 mg p.o. daily and can start the medication with RT and Kadcyla.   Options discussed  due to residual disease on adjuvant Herceptin and Perjeta would be to switch to Kadcyla  3.6 mg/kg every 3 weeks for 14 cycles based on results of the recent  [facility].    She is here for evaluation prior to C2 of Kadcyla.  Last ECHO done 02/01/2018 showed EF 60-65%.  She will have repeat ECHO in 04/2018.  She reports she will begin RT on May 8th.  She will proceed with Kadcyla today and will RTC in 3 weeks for C3 of therapy.  She will be seen every 6 weeks as therapy proceeds.  She should notify the office if she has any problems prior to her next visit.   2.  Joint pain.  She reports this is improving as she continue Femara.  Pt should have bone density done as previously recommended.  She should notify the office if symptoms worsen as Femara continues.    3.  Hypertension.  BP is 119/75.  Follow-up with PCP.    Interval History:  57 yr old female with  pT1cN1a Stage IIA  invasive ductal carcinoma of (R) breast; ER+/PR+/HER2+.  Pt had screening mammogram in 03/2017 for (R)  breast abnormality. Diagnostic mammogram/ultrasound of (R) breast suspicious for malignancy. Underwent (R) breast biopsy on 04/13/17 revealing invasive ductal carcinoma, grade 3, ER+, PR+, HER2+. Seen initially in consultation with Dr. Lindi Adie at [facility], and patient elected to have her treatment closer to home and presented to [facility] for cycle #1 neoadjuvant chemo with [facility] on 05/29/17. Marland Kitchen   Pt completed neoadjuvant [facility] chemo on 10/09/17. She was being followed by NP Renato Battles and Dr. Sherrine Maples.   She was scheduled to see her surgeon, Dr. Lucia Gaskins, but she had to reschedule this appointment due to  recent injury that her husband sustained.  It is very important to her that her husband be present for this appointment.    Treatment breast MRI showed only partial response to both the primary breast mass and axillary adenopathy. MRI done 11/11/2017 showed right breast lesion now measuring 2.3 cm previously 2.7 cm per MRI report.  ECHO done 11/02/2017 showed normal EF 60-65%.  She has undergone right lumpectomy on 12/09/2017.  Lesion #1 of the right breast was intraductal papilloma.  There was no evidence of malignancy.  Lesion to of the right breast showed invasive ductal carcinoma grade 3 measuring 2 cm and  there was evidence of high-grade DCIS.  She had one lymph node positive for disease.  Nipple biopsy on the right showed no malignancy.  Margins were greater than 0.2 cm in all final margins.  Her tumor was ER +100% PR +70% HER-2 was positive by FISH with  a ratio of 2.37.  Ki 67 was 60%.  She had evidence of residual disease with one positive lymph node on definitive surgery that was done on December 09, 2017.  She is recommended for adjuvant endocrine therapy with Femara 2.5 mg p.o. daily and can start the medication with RT and Kadcyla.   Options discussed  due to residual disease on adjuvant Herceptin and Perjeta would be to switch to Kadcyla  3.6 mg/kg every 3 weeks for 14 cycles based on results of the  recent  [facility].    Current Status:  Pt seen today for follow-up.  She is here for evaluation prior to C2 of Kadcyla.  She was complaining of joint pain, but feels this is improving.      Malignant neoplasm of upper-inner quadrant of right breast in female, estrogen receptor positive ([place])   04/13/2017 Initial Diagnosis    Screening detected right breast mass with calcifications 2.1 cm in size and 3.4 cm in size. Calcifications were fibroadenoma. Right breast biopsy upper inner quadrant 1:00 mass: IDC with DCIS with necrosis, grade 3, ER 100%, PR 70%, Ki-67 60%, HER-2 positive ratio 2.34, T2 N1 stage IB (New AJCC)      04/25/2017 Breast MRI    Right breast upper inner quadrant 2.2 x 1.7 x 2.7 cm mass, abnormal area of clumped non-mass enhancement in the right lateral breast 3.2 x 1.9 x 1 cm, too abnormal lymph nodes right axilla 4.3 and 1.7 cm (biopsy-proven breast cancer)       05/08/2017 Procedure    Right axilla lymph node biopsy: Metastatic carcinoma      05/13/2017 Procedure    Right breast biopsy retroareolar region: Intraductal papilloma      01/13/2018 -  Chemotherapy    The patient had ado-trastuzumab emtansine (KADCYLA) 420 mg in sodium chloride 0.9 % 250 mL chemo infusion, 3.6 mg/kg = 420 mg, Intravenous, Once, 2 of 5 cycles Administration: 420 mg (01/20/2018), 420 mg (02/10/2018)  for chemotherapy treatment.         Problem List Patient Active Problem List   Diagnosis Date Noted  . Weight loss [R63.4] 06/13/2017  . Malignant neoplasm of upper-inner quadrant of right breast in female, estrogen receptor positive ([place]) [C50.211, Z17.0] 04/16/2017    Past Medical History Past Medical History:  Diagnosis Date  . Asthma   . Blood transfusion    as child  . Cancer ([place])   . Diabetes ([place])    type 2   . GERD (gastroesophageal reflux disease)   . Headache   . Heart murmur    d/t aortic stenosis   . History of kidney stones   . Hyperlipidemia   . Hypertension   .  Normal echocardiogram   . Sleep apnea     Past Surgical History Past Surgical History:  Procedure Laterality Date  . ABDOMINAL HYSTERECTOMY  2004  . BREAST LUMPECTOMY WITH RADIOACTIVE SEED AND SENTINEL LYMPH NODE BIOPSY Right 12/09/2017   Procedure: RIGHT BREAST LUMPECTOMY WITH RADIOACTIVE SEED AND SENTINEL LYMPH NODE BIOPSY AND RADIOACTIVE SEED TARGETED RIGHT AXILLARY LYMPH NODE EXCISION;  Surgeon: Alphonsa Overall, MD;  Location: [facility];  Service: General;  Laterality: Right;  . CHOLECYSTECTOMY  05/30/2011   Procedure: LAPAROSCOPIC CHOLECYSTECTOMY;  Surgeon: Donato Heinz;  Location: [facility];  Service: General;  Laterality: N/A;  . 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 KIDNEY STONE SURGERY     2017 2-23  .  PORTACATH PLACEMENT N/A 05/25/2017   Procedure: INSERTION PORT-A-CATH;  Surgeon: Alphonsa Overall, MD;  Location: [facility];  Service: General;  Laterality: N/A;    Family History Family History  Problem Relation Age of Onset  . Lung cancer Maternal Grandmother   . Anesthesia problems Neg Hx   . Hypotension Neg Hx   . Malignant hyperthermia Neg Hx   . Pseudochol deficiency Neg Hx      Social History  reports that she has never smoked. She has never used smokeless tobacco. She reports that she drinks alcohol. She reports that she does not use drugs.  Medications  Current Outpatient Medications:  .  acetaminophen (TYLENOL) 500 MG tablet, Take 1,000 mg by mouth daily as needed for moderate pain or headache., Disp: , Rfl:  .  Ado-Trastuzumab Emtansine (KADCYLA IV), Inject into the vein., Disp: , Rfl:  .  albuterol (PROVENTIL HFA;VENTOLIN HFA) 108 (90 BASE) MCG/ACT inhaler, Inhale 1 puff into the lungs every 6 (six) hours as needed for wheezing or shortness of breath. , Disp: , Rfl:  .  cephALEXin (KEFLEX) 500 MG capsule, Take 1 capsule (500 mg total) by mouth 3 (three) times daily., Disp: 30 capsule, Rfl: 0 .  diphenhydrAMINE (BENADRYL) 25 mg capsule, Take 25 mg  by mouth at bedtime as needed for allergies. , Disp: , Rfl:  .  diphenoxylate-atropine (LOMOTIL) 2.5-0.025 MG tablet, TAKE (1) TABLET BY MOUTH (4) TIMES DAILY AS NEEDED FOR DIARRHEA OR LOOSE STOOLS., Disp: 45 tablet, Rfl: 0 .  escitalopram (LEXAPRO) 20 MG tablet, Take 20 mg by mouth at bedtime. , Disp: , Rfl:  .  fenofibrate 160 MG tablet, Take 160 mg by mouth at bedtime. , Disp: , Rfl:  .  fluticasone (FLONASE) 50 MCG/ACT nasal spray, Place 1 spray into both nostrils daily as needed for allergies or rhinitis., Disp: , Rfl:  .  HYDROcodone-acetaminophen (NORCO/VICODIN) 5-325 MG tablet, Take 1 tablet by mouth every 6 (six) hours as needed for moderate pain., Disp: 20 tablet, Rfl: 0 .  letrozole (FEMARA) 2.5 MG tablet, Take 1 tablet (2.5 mg total) by mouth daily., Disp: 30 tablet, Rfl: 6 .  lidocaine-prilocaine (EMLA) cream, Apply 1 application topically daily as needed (port access). , Disp: , Rfl:  .  loperamide (IMODIUM A-D) 2 MG tablet, Take 4 mg by mouth as needed for diarrhea or loose stools., Disp: , Rfl:  .  losartan (COZAAR) 50 MG tablet, Take 25 mg by mouth at bedtime. , Disp: , Rfl:  .  magic mouthwash w/lidocaine SOLN, Take 5 mLs by mouth 4 (four) times daily as needed for mouth pain., Disp: 360 mL, Rfl: 1 .  Multiple Vitamin (MULTIVITAMIN) tablet, Take 1 tablet by mouth at bedtime. , Disp: , Rfl:  .  neomycin-bacitracin-polymyxin (NEOSPORIN) ointment, Apply 1 application topically as needed for wound care., Disp: , Rfl:  .  omeprazole (PRILOSEC) 40 MG capsule, Take 1 capsule (40 mg total) by mouth daily. (Patient taking differently: Take 40 mg by mouth daily as needed (heartburn). ), Disp: 30 capsule, Rfl: 5 .  prochlorperazine (COMPAZINE) 10 MG tablet, TAKE (1) TABLET BY MOUTH EVERY SIX HOURS AS NEEDED FOR NAUSEA AND VOMITING., Disp: 30 tablet, Rfl: 0 No current facility-administered medications for this visit.   Facility-Administered Medications Ordered in Other Visits:  .  sodium  chloride flush (NS) 0.9 % injection 10 mL, 10 mL, Intracatheter, PRN, Sherrick Araki, MD, 10 mL at 02/10/18 0945  Allergies Latex; Pyridostigmine bromide; and Statins  Review of  Systems Review of Systems - Oncology ROS as per HPI otherwise 12 point ROS is negative other than joint pain.   Physical Exam  Vitals Wt Readings from Last 3 Encounters:  02/10/18 252 lb 9.6 oz (114.6 kg)  02/10/18 252 lb 9.6 oz (114.6 kg)  01/20/18 255 lb (115.7 kg)   Temp Readings from Last 3 Encounters:  02/10/18 98 F (36.7 C) (Oral)  02/10/18 98 F (36.7 C) (Oral)  01/20/18 97.9 F (36.6 C) (Oral)   BP Readings from Last 3 Encounters:  02/10/18 119/75  02/10/18 (!) 122/46  01/20/18 118/64   Pulse Readings from Last 3 Encounters:  02/10/18 70  02/10/18 63  01/20/18 64    Constitutional: Well-developed, well-nourished, and in no distress.   HENT: Head: Normocephalic and atraumatic.  Mouth/Throat: No oropharyngeal exudate. Mucosa moist. Eyes: Pupils are equal, round, and reactive to light. Conjunctivae are normal. No scleral icterus.  Neck: Normal range of motion. Neck supple. No JVD present.  Cardiovascular: Normal rate, regular rhythm and normal heart sounds.  Exam reveals no gallop and no friction rub.   No murmur heard. Pulmonary/Chest: Effort normal and breath sounds normal. No respiratory distress. No wheezes.No rales.  Abdominal: Soft. Bowel sounds are normal. No distension. There is no tenderness. There is no guarding.  Musculoskeletal: No edema or tenderness.  Lymphadenopathy: No cervical, axillary or supraclavicular adenopathy.  Neurological: Alert and oriented to person, place, and time. No cranial nerve deficit.  Skin: Skin is warm and dry. No rash noted. No erythema. No pallor.  Psychiatric: Affect and judgment normal.  Bilateral breast exam: Chaperone present.  Right lumpectomy noted. Crusted area over nipple noted on right is improved.   Left breast and right breast show no  palpable masses.     Labs Infusion on 02/10/2018  Component Date Value Ref Range Status  . LDH 02/10/2018 120  98 - 192 U/L Final   Performed at [facility], [address] [place], [place] [address]  . Sodium 02/10/2018 138  135 - 145 mmol/L Final  . Potassium 02/10/2018 3.7  3.5 - 5.1 mmol/L Final  . Chloride 02/10/2018 103  101 - 111 mmol/L Final  . CO2 02/10/2018 26  22 - 32 mmol/L Final  . Glucose, Bld 02/10/2018 145* 65 - 99 mg/dL Final  . BUN 02/10/2018 19  6 - 20 mg/dL Final  . Creatinine, Ser 02/10/2018 0.83  0.44 - 1.00 mg/dL Final  . Calcium 02/10/2018 10.6* 8.9 - 10.3 mg/dL Final  . Total Protein 02/10/2018 7.6  6.5 - 8.1 g/dL Final  . Albumin 02/10/2018 3.9  3.5 - 5.0 g/dL Final  . AST 02/10/2018 50* 15 - 41 U/L Final  . ALT 02/10/2018 41  14 - 54 U/L Final  . Alkaline Phosphatase 02/10/2018 102  38 - 126 U/L Final  . Total Bilirubin 02/10/2018 0.7  0.3 - 1.2 mg/dL Final  . GFR calc non Af Amer 02/10/2018 >60  >60 mL/min Final  . GFR calc Af Amer 02/10/2018 >60  >60 mL/min Final   Comment: (NOTE) The eGFR has been calculated using the CKD EPI equation. This calculation has not been validated in all clinical situations. eGFR's persistently <60 mL/min signify possible Chronic Kidney Disease.   Georgiann Hahn gap 02/10/2018 9  5 - 15 Final   Performed at [facility], [address], [place], [place] [address]  . WBC 02/10/2018 5.8  4.0 - 10.5 K/uL Final  . RBC 02/10/2018 4.37  3.87 - 5.11 MIL/uL  Final  . Hemoglobin 02/10/2018 12.3  12.0 - 15.0 g/dL Final  . HCT 02/10/2018 38.5  36.0 - 46.0 % Final  . MCV 02/10/2018 88.1  78.0 - 100.0 fL Final  . MCH 02/10/2018 28.1  26.0 - 34.0 pg Final  . MCHC 02/10/2018 31.9  30.0 - 36.0 g/dL Final  . RDW 02/10/2018 13.8  11.5 - 15.5 % Final  . Platelets 02/10/2018 157  150 - 400 K/uL Final  . Neutrophils Relative % 02/10/2018 51  % Final  . Neutro Abs 02/10/2018 2.9  1.7 - 7.7 K/uL Final  . Lymphocytes Relative 02/10/2018 38  %  Final  . Lymphs Abs 02/10/2018 2.2  0.7 - 4.0 K/uL Final  . Monocytes Relative 02/10/2018 9  % Final  . Monocytes Absolute 02/10/2018 0.5  0.1 - 1.0 K/uL Final  . Eosinophils Relative 02/10/2018 2  % Final  . Eosinophils Absolute 02/10/2018 0.1  0.0 - 0.7 K/uL Final  . Basophils Relative 02/10/2018 0  % Final  . Basophils Absolute 02/10/2018 0.0  0.0 - 0.1 K/uL Final   Performed at [facility], [address] [place], [place] [address]     Pathology Orders Placed This Encounter  Procedures  . CBC with Differential/Platelet    Standing Status:   Future    Standing Expiration Date:   02/11/2019  . Comprehensive metabolic panel    Standing Status:   Future    Standing Expiration Date:   02/11/2019  . Lactate dehydrogenase    Standing Status:   Future    Standing Expiration Date:   02/11/2019       Zoila Shutter MD

## 2018-02-10 NOTE — Progress Notes (Signed)
1045 Labs reviewed with and pt seen by Dr. Walden Field and pt approved for Kadcyla infusion today per MD. Future labs can now be done every 6 weeks with Kadcyla per Dr. Jefm Miles tolerated Kadcyla infusion well without complaints or incident. VSS upon discharge. Pt discharged self ambulatory in satisfactory condition

## 2018-02-15 ENCOUNTER — Telehealth (HOSPITAL_COMMUNITY): Payer: Self-pay | Admitting: Emergency Medicine

## 2018-02-15 NOTE — Telephone Encounter (Signed)
Pt called and stated that she was having nosebleeds off and on since yesterday that were hard to stop.  These nosebleeds had clots.  Told pt that if it starts again then go to the ER or urgent care.  Pt verbalized understanding.

## 2018-02-16 ENCOUNTER — Ambulatory Visit
Admission: RE | Admit: 2018-02-16 | Discharge: 2018-02-16 | Disposition: A | Discharge status: Home / Self Care | Payer: 59 | Source: Ambulatory Visit | Attending: Radiation Oncology | Admitting: Radiation Oncology

## 2018-02-16 DIAGNOSIS — C50211 Malignant neoplasm of upper-inner quadrant of right female breast: Secondary | ICD-10-CM | POA: Diagnosis not present

## 2018-02-17 ENCOUNTER — Ambulatory Visit: Payer: 59

## 2018-02-18 ENCOUNTER — Ambulatory Visit: Payer: 59

## 2018-02-19 ENCOUNTER — Ambulatory Visit: Payer: 59

## 2018-02-20 ENCOUNTER — Emergency Department (HOSPITAL_COMMUNITY)
Admission: EM | Admit: 2018-02-20 | Discharge: 2018-02-20 | Disposition: A | Discharge status: Home / Self Care | Payer: 59 | Attending: Emergency Medicine | Admitting: Emergency Medicine

## 2018-02-20 ENCOUNTER — Other Ambulatory Visit: Payer: Self-pay

## 2018-02-20 ENCOUNTER — Emergency Department (HOSPITAL_COMMUNITY): Payer: 59

## 2018-02-20 ENCOUNTER — Encounter (HOSPITAL_COMMUNITY): Payer: Self-pay | Admitting: Emergency Medicine

## 2018-02-20 DIAGNOSIS — H9312 Tinnitus, left ear: Secondary | ICD-10-CM | POA: Diagnosis not present

## 2018-02-20 DIAGNOSIS — Z853 Personal history of malignant neoplasm of breast: Secondary | ICD-10-CM | POA: Insufficient documentation

## 2018-02-20 DIAGNOSIS — J45909 Unspecified asthma, uncomplicated: Secondary | ICD-10-CM | POA: Diagnosis not present

## 2018-02-20 DIAGNOSIS — H81399 Other peripheral vertigo, unspecified ear: Secondary | ICD-10-CM | POA: Diagnosis not present

## 2018-02-20 DIAGNOSIS — I10 Essential (primary) hypertension: Secondary | ICD-10-CM | POA: Diagnosis not present

## 2018-02-20 DIAGNOSIS — Z91040 Latex allergy status: Secondary | ICD-10-CM | POA: Diagnosis not present

## 2018-02-20 DIAGNOSIS — N3090 Cystitis, unspecified without hematuria: Secondary | ICD-10-CM | POA: Diagnosis not present

## 2018-02-20 DIAGNOSIS — Z79899 Other long term (current) drug therapy: Secondary | ICD-10-CM | POA: Insufficient documentation

## 2018-02-20 DIAGNOSIS — R0789 Other chest pain: Secondary | ICD-10-CM | POA: Insufficient documentation

## 2018-02-20 DIAGNOSIS — E119 Type 2 diabetes mellitus without complications: Secondary | ICD-10-CM | POA: Insufficient documentation

## 2018-02-20 LAB — URINALYSIS, ROUTINE W REFLEX MICROSCOPIC
BILIRUBIN URINE: NEGATIVE
Glucose, UA: NEGATIVE mg/dL
HGB URINE DIPSTICK: NEGATIVE
Ketones, ur: NEGATIVE mg/dL
Nitrite: NEGATIVE
PH: 6 (ref 5.0–8.0)
Protein, ur: NEGATIVE mg/dL
WBC, UA: >50 WBC/hpf — ABNORMAL HIGH (ref 0–5)

## 2018-02-20 LAB — CBC WITH DIFFERENTIAL/PLATELET
Basophils Absolute: 0 10*3/uL (ref 0.0–0.1)
Basophils Relative: 0 %
EOS ABS: 0.1 10*3/uL (ref 0.0–0.7)
Eosinophils Relative: 2 %
HCT: 40.3 % (ref 36.0–46.0)
HEMOGLOBIN: 13.2 g/dL (ref 12.0–15.0)
LYMPHS ABS: 2.2 10*3/uL (ref 0.7–4.0)
Lymphocytes Relative: 34 %
MCH: 28.5 pg (ref 26.0–34.0)
MCHC: 32.8 g/dL (ref 30.0–36.0)
MCV: 87 fL (ref 78.0–100.0)
MONOS PCT: 11 %
Monocytes Absolute: 0.7 10*3/uL (ref 0.1–1.0)
NEUTROS PCT: 53 %
Neutro Abs: 3.6 10*3/uL (ref 1.7–7.7)
Platelets: 121 10*3/uL — ABNORMAL LOW (ref 150–400)
RBC: 4.63 MIL/uL (ref 3.87–5.11)
RDW: 14.2 % (ref 11.5–15.5)
WBC: 6.6 10*3/uL (ref 4.0–10.5)

## 2018-02-20 LAB — COMPREHENSIVE METABOLIC PANEL
ALT: 53 U/L (ref 14–54)
ANION GAP: 12 (ref 5–15)
AST: 65 U/L — ABNORMAL HIGH (ref 15–41)
Albumin: 4.3 g/dL (ref 3.5–5.0)
Alkaline Phosphatase: 97 U/L (ref 38–126)
BILIRUBIN TOTAL: 1.3 mg/dL — AB (ref 0.3–1.2)
BUN: 16 mg/dL (ref 6–20)
CO2: 27 mmol/L (ref 22–32)
Calcium: 11.5 mg/dL — ABNORMAL HIGH (ref 8.9–10.3)
Chloride: 102 mmol/L (ref 101–111)
Creatinine, Ser: 0.94 mg/dL (ref 0.44–1.00)
GFR calc Af Amer: >60 mL/min (ref >60)
GFR calc non Af Amer: >60 mL/min (ref >60)
Glucose, Bld: 163 mg/dL — ABNORMAL HIGH (ref 65–99)
POTASSIUM: 3.4 mmol/L — AB (ref 3.5–5.1)
Sodium: 141 mmol/L (ref 135–145)
TOTAL PROTEIN: 8.7 g/dL — AB (ref 6.5–8.1)

## 2018-02-20 LAB — TROPONIN I
Troponin I: <0.03 ng/mL (ref <0.03)
Troponin I: <0.03 ng/mL (ref <0.03)

## 2018-02-20 LAB — SALICYLATE LEVEL

## 2018-02-20 LAB — LIPASE, BLOOD: LIPASE: 33 U/L (ref 11–51)

## 2018-02-20 MED ORDER — MECLIZINE HCL 25 MG PO TABS: 25.0000 mg | ORAL_TABLET | Freq: Three times a day (TID) | ORAL | 0 refills | Status: AC | PRN

## 2018-02-20 MED ORDER — OXYCODONE HCL 5 MG PO TABS
ORAL_TABLET | ORAL | Status: AC
  Administered 2018-02-20: 5 mg via ORAL
  Filled 2018-02-20: qty 1

## 2018-02-20 MED ORDER — CEPHALEXIN 500 MG PO CAPS
500.0000 mg | ORAL_CAPSULE | Freq: Once | ORAL | Status: AC
Start: 2018-02-20 — End: 2018-02-20
  Administered 2018-02-20: 500 mg via ORAL
  Filled 2018-02-20: qty 1

## 2018-02-20 MED ORDER — CEPHALEXIN 500 MG PO CAPS: 500.0000 mg | ORAL_CAPSULE | Freq: Four times a day (QID) | ORAL | 0 refills | Status: DC

## 2018-02-20 MED ORDER — OXYCODONE HCL 5 MG PO TABS
5.0000 mg | ORAL_TABLET | Freq: Once | ORAL | Status: AC
  Administered 2018-02-20: 5 mg via ORAL

## 2018-02-20 MED ORDER — MECLIZINE HCL 12.5 MG PO TABS
25.0000 mg | ORAL_TABLET | Freq: Once | ORAL | Status: AC
  Administered 2018-02-20: 25 mg via ORAL
  Filled 2018-02-20: qty 2

## 2018-02-20 MED ORDER — ONDANSETRON HCL 4 MG/2ML IJ SOLN
4.0000 mg | INTRAMUSCULAR | Status: DC | PRN
  Administered 2018-02-20: 4 mg via INTRAVENOUS
  Filled 2018-02-20: qty 2

## 2018-02-20 MED ORDER — OXYCODONE HCL 5 MG PO TABS: 5.0000 mg | ORAL_TABLET | Freq: Four times a day (QID) | ORAL | 0 refills | Status: DC | PRN

## 2018-02-20 MED ORDER — MORPHINE SULFATE (PF) 2 MG/ML IV SOLN
2.0000 mg | INTRAVENOUS | Status: DC | PRN
  Administered 2018-02-20: 2 mg via INTRAVENOUS
  Filled 2018-02-20: qty 1

## 2018-02-20 MED ORDER — ONDANSETRON 4 MG PO TBDP: 4.0000 mg | ORAL_TABLET | Freq: Three times a day (TID) | ORAL | 0 refills | Status: DC | PRN

## 2018-02-20 MED ORDER — IOPAMIDOL (ISOVUE-370) INJECTION 76%
100.0000 mL | Freq: Once | INTRAVENOUS | Status: AC | PRN
  Administered 2018-02-20: 100 mL via INTRAVENOUS

## 2018-02-20 NOTE — ED Notes (Signed)
Intermittent chest pain and dizziness since last night.

## 2018-02-20 NOTE — ED Notes (Signed)
Pt ambulatory with no assistance. Pt states her dizziness has improved

## 2018-02-20 NOTE — ED Provider Notes (Signed)
[facility] EMERGENCY DEPARTMENT Provider Note   CSN: [id number] Arrival date & time: 02/20/18  1410     History   Chief Complaint Chief Complaint  Patient presents with  . Chest Pain    off and on since last night    HPI Renee Harris is a 57 y.o. female.  HPI  Pt was seen at 1445.  Per pt, c/o gradual onset and persistence of constant left upper chest wall pain that began yesterday. Pt describes the CP as located in her left upper chest wall, clavicle area and anterior shoulder, "aching." Pain worsens with palpation of the area. Pt states yesterday the CP was "coming and going" for "a few seconds" each episode, but then became constant at 0800 today.  Pt also c/o left ear "ringing" for the past several weeks, worse over the past several days. Pt also c/o "spinning sensation" that has been occurring intermittently since yesterday. Has been associated with N/V. Spinning sensation occurs when she turns her head side to side, lays or drops her head back. Denies trauma, no headache, no ear pain, no change in chronic eyes deformity, no slurred speech, no facial droop, no palpitations, no SOB/cough, no back pain, no abd pain, no diarrhea, no focal motor weakness, no tingling/numbness in extremities, no calf/LE pain or unilateral swelling.      Past Medical History:  Diagnosis Date  . Asthma   . Blood transfusion    as child  . Cancer ([place])   . Diabetes ([place])    type 2   . GERD (gastroesophageal reflux disease)   . Headache   . Heart murmur    d/t aortic stenosis   . History of kidney stones   . Hyperlipidemia   . Hypertension   . Normal echocardiogram   . Sleep apnea     Patient Active Problem List   Diagnosis Date Noted  . Weight loss 06/13/2017  . Malignant neoplasm of upper-inner quadrant of right breast in female, estrogen receptor positive ([place]) 04/16/2017    Past Surgical History:  Procedure Laterality Date  . ABDOMINAL HYSTERECTOMY  2004  . BREAST LUMPECTOMY  WITH RADIOACTIVE SEED AND SENTINEL LYMPH NODE BIOPSY Right 12/09/2017   Procedure: RIGHT BREAST LUMPECTOMY WITH RADIOACTIVE SEED AND SENTINEL LYMPH NODE BIOPSY AND RADIOACTIVE SEED TARGETED RIGHT AXILLARY LYMPH NODE EXCISION;  Surgeon: Alphonsa Overall, MD;  Location: [facility];  Service: General;  Laterality: Right;  . CHOLECYSTECTOMY  05/30/2011   Procedure: LAPAROSCOPIC CHOLECYSTECTOMY;  Surgeon: Donato Heinz;  Location: [facility];  Service: General;  Laterality: N/A;  . 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 KIDNEY STONE SURGERY     2017 2-23  . PORTACATH PLACEMENT N/A 05/25/2017   Procedure: INSERTION PORT-A-CATH;  Surgeon: Alphonsa Overall, MD;  Location: [facility];  Service: General;  Laterality: N/A;     OB History   None      Home Medications    Prior to Admission medications   Medication Sig Start Date End Date Taking? Authorizing Provider  acetaminophen (TYLENOL) 500 MG tablet Take 1,000 mg by mouth daily as needed for moderate pain or headache.    [provider]  Ado-Trastuzumab Emtansine (KADCYLA IV) Inject into the vein.    [provider]  albuterol (PROVENTIL HFA;VENTOLIN HFA) 108 (90 BASE) MCG/ACT inhaler Inhale 1 puff into the lungs every 6 (six) hours as needed for wheezing or shortness of breath.  [provider]  cephALEXin (KEFLEX) 500 MG capsule Take 1 capsule (500 mg total) by mouth 3 (three) times daily. 01/12/18   Hayden Pedro, PA-C  diphenhydrAMINE (BENADRYL) 25 mg capsule Take 25 mg by mouth at bedtime as needed for allergies.     [provider]  diphenoxylate-atropine (LOMOTIL) 2.5-0.025 MG tablet TAKE (1) TABLET BY MOUTH (4) TIMES DAILY AS NEEDED FOR DIARRHEA OR LOOSE STOOLS. 09/29/17   Holley Bouche, NP  escitalopram (LEXAPRO) 20 MG tablet Take 20 mg by mouth at bedtime.     [provider]  fenofibrate 160 MG tablet Take 160 mg by mouth at bedtime.     [provider]    fluticasone (FLONASE) 50 MCG/ACT nasal spray Place 1 spray into both nostrils daily as needed for allergies or rhinitis.    [provider]  HYDROcodone-acetaminophen (NORCO/VICODIN) 5-325 MG tablet Take 1 tablet by mouth every 6 (six) hours as needed for moderate pain. 12/09/17   Alphonsa Overall, MD  letrozole [facility]) 2.5 MG tablet Take 1 tablet (2.5 mg total) by mouth daily. 01/12/18   Higgs, Mathis Dad, MD  lidocaine-prilocaine (EMLA) cream Apply 1 application topically daily as needed (port access).  10/05/17   [provider]  loperamide (IMODIUM A-D) 2 MG tablet Take 4 mg by mouth as needed for diarrhea or loose stools.    [provider]  losartan (COZAAR) 50 MG tablet Take 25 mg by mouth at bedtime.     [provider]  magic mouthwash w/lidocaine SOLN Take 5 mLs by mouth 4 (four) times daily as needed for mouth pain. 07/17/17   Twana First, MD  Multiple Vitamin (MULTIVITAMIN) tablet Take 1 tablet by mouth at bedtime.     [provider]  neomycin-bacitracin-polymyxin (NEOSPORIN) ointment Apply 1 application topically as needed for wound care.    [provider]  omeprazole (PRILOSEC) 40 MG capsule Take 1 capsule (40 mg total) by mouth daily. Patient taking differently: Take 40 mg by mouth daily as needed (heartburn).  11/03/17   Holley Bouche, NP  prochlorperazine (COMPAZINE) 10 MG tablet TAKE (1) TABLET BY MOUTH EVERY SIX HOURS AS NEEDED FOR NAUSEA AND VOMITING. 08/05/17   Nicholas Lose, MD    Family History Family History  Problem Relation Age of Onset  . Lung cancer Maternal Grandmother   . Anesthesia problems Neg Hx   . Hypotension Neg Hx   . Malignant hyperthermia Neg Hx   . Pseudochol deficiency Neg Hx     Social History Social History   Tobacco Use  . Smoking status: Never Smoker  . Smokeless tobacco: Never Used  Substance Use Topics  . Alcohol use: Yes    Comment: 2 x year  . Drug use: No     Allergies   Latex;  Pyridostigmine bromide; and Statins   Review of Systems Review of Systems ROS: Statement: All systems negative except as marked or noted in the HPI; Constitutional: Negative for fever and chills. ; ; Eyes: Negative for eye pain, redness and discharge. ; ; ENMT: +ringing left ear. Negative for ear pain, hoarseness, nasal congestion, sinus pressure and sore throat. ; ; Cardiovascular: Negative for palpitations, diaphoresis, dyspnea and peripheral edema. ; ; Respiratory: Negative for cough, wheezing and stridor. ; ; Gastrointestinal: +N/V. Negative for diarrhea, abdominal pain, blood in stool, hematemesis, jaundice and rectal bleeding. . ; ; Genitourinary: Negative for dysuria, flank pain and hematuria. ; ; Musculoskeletal: +upper chest wall pain. Negative for back  pain and neck pain. Negative for swelling and trauma.; ; Skin: Negative for pruritus, rash, abrasions, blisters, bruising and skin lesion.; ; Neuro: +vertigo. Negative for headache, lightheadedness and neck stiffness. Negative for weakness, altered level of consciousness, altered mental status, extremity weakness, paresthesias, involuntary movement, seizure and syncope.      Physical Exam Updated Vital Signs BP 120/66 (BP Location: Right Arm)   Pulse 96   Temp 97.8 F (36.6 C) (Oral)   Resp 20   Ht 5\' 1"  (1.549 m)   Wt 114.3 kg (252 lb)   SpO2 99%   BMI 47.61 kg/m   BP (!) 150/71   Pulse 81   Temp 97.8 F (36.6 C) (Oral)   Resp 18   Ht 5\' 1"  (1.549 m)   Wt 114.3 kg (252 lb)   SpO2 94%   BMI 47.61 kg/m    Physical Exam 1450: Physical examination:  Nursing notes reviewed; Vital signs and O2 SAT reviewed;  Constitutional: Well developed, Well nourished, Well hydrated, In no acute distress; Head:  Normocephalic, atraumatic; Eyes: Left eye with left lateral gaze, Right eye does not cross midline: pt and family state this is chronic. PERRL, No scleral icterus; ENMT: TM's clear bilat. +edemetous nasal turbinates bilat with clear  rhinorrhea. Mouth and pharynx normal, Mucous membranes moist; Neck: Supple, Full range of motion, No lymphadenopathy; Cardiovascular: Regular rate and rhythm, No gallop; Respiratory: Breath sounds clear & equal bilaterally, No wheezes.  Speaking full sentences with ease, Normal respiratory effort/excursion; Chest: +left upper anterior chest wall, clavicle and anterior shoulder area tenderness to palp. No deformity, no soft tissue crepitus, no rash. Movement normal; Abdomen: Soft, Nontender, Nondistended, Normal bowel sounds; Genitourinary: No CVA tenderness; Spine:  No midline CS, TS, LS tenderness.;; Extremities: Peripheral pulses normal, No tenderness, No edema, No calf edema or asymmetry.; Neuro: AA&Ox3, Major CN grossly intact. No facial droop. Unable to evaluate for nystagmus d/t chronic eyes deviation. Speech clear.  Grips equal. Strength 5/5 equal bilat UE's and LE's.  DTR 2/4 equal bilat UE's and LE's.  No gross sensory deficits.  Normal cerebellar testing bilat UE's (finger-nose) and LE's (heel-shin).;; Skin: Color normal, Warm, Dry.   ED Treatments / Results  Labs (all labs ordered are listed, but only abnormal results are displayed)   EKG EKG Interpretation  Date/Time:  Saturday Feb 20 2018 14:33:30 EDT Ventricular Rate:  96 PR Interval:    QRS Duration: 95 QT Interval:  384 QTC Calculation: 486 R Axis:   34 Text Interpretation:  Sinus rhythm Borderline low voltage, extremity leads Baseline wander When compared with ECG of 05/22/2017 and 05/23/2011 No significant change was found Confirmed by Francine Graven [phone]) on 02/20/2018 3:13:13 PM   Radiology   Procedures Procedures (including critical care time)  Medications Ordered in ED Medications  ondansetron (ZOFRAN) injection 4 mg (4 mg Intravenous Given 02/20/18 1527)  morphine 2 MG/ML injection 2 mg (2 mg Intravenous Given 02/20/18 1527)  meclizine (ANTIVERT) tablet 25 mg (25 mg Oral Given 02/20/18 1529)     Initial  Impression / Assessment and Plan / ED Course  I have reviewed the triage vital signs and the nursing notes.  Pertinent labs & imaging results that were available during my care of the patient were reviewed by me and considered in my medical decision making (see chart for details).  MDM Reviewed: previous chart, nursing note and vitals Reviewed previous: labs and ECG Interpretation: labs, ECG, x-ray and CT scan   Results for orders placed  or performed during the hospital encounter of 02/20/18  Troponin I  Result Value Ref Range   Troponin I <0.03 <0.03 ng/mL  Comprehensive metabolic panel  Result Value Ref Range   Sodium 141 135 - 145 mmol/L   Potassium 3.4 (L) 3.5 - 5.1 mmol/L   Chloride 102 101 - 111 mmol/L   CO2 27 22 - 32 mmol/L   Glucose, Bld 163 (H) 65 - 99 mg/dL   BUN 16 6 - 20 mg/dL   Creatinine, Ser 0.94 0.44 - 1.00 mg/dL   Calcium 11.5 (H) 8.9 - 10.3 mg/dL   Total Protein 8.7 (H) 6.5 - 8.1 g/dL   Albumin 4.3 3.5 - 5.0 g/dL   AST 65 (H) 15 - 41 U/L   ALT 53 14 - 54 U/L   Alkaline Phosphatase 97 38 - 126 U/L   Total Bilirubin 1.3 (H) 0.3 - 1.2 mg/dL   GFR calc non Af Amer >60 >60 mL/min   GFR calc Af Amer >60 >60 mL/min   Anion gap 12 5 - 15  Lipase, blood  Result Value Ref Range   Lipase 33 11 - 51 U/L  Urinalysis, Routine w reflex microscopic  Result Value Ref Range   Color, Urine YELLOW YELLOW   APPearance HAZY (A) CLEAR   Specific Gravity, Urine >1.046 (H) 1.005 - 1.030   pH 6.0 5.0 - 8.0   Glucose, UA NEGATIVE NEGATIVE mg/dL   Hgb urine dipstick NEGATIVE NEGATIVE   Bilirubin Urine NEGATIVE NEGATIVE   Ketones, ur NEGATIVE NEGATIVE mg/dL   Protein, ur NEGATIVE NEGATIVE mg/dL   Nitrite NEGATIVE NEGATIVE   Leukocytes, UA LARGE (A) NEGATIVE   RBC / HPF 0-5 0 - 5 RBC/hpf   WBC, UA >50 (H) 0 - 5 WBC/hpf   Bacteria, UA RARE (A) NONE SEEN   Squamous Epithelial / LPF 0-5 0 - 5   Mucus PRESENT   CBC with Differential  Result Value Ref Range   WBC 6.6 4.0 -  10.5 K/uL   RBC 4.63 3.87 - 5.11 MIL/uL   Hemoglobin 13.2 12.0 - 15.0 g/dL   HCT 40.3 36.0 - 46.0 %   MCV 87.0 78.0 - 100.0 fL   MCH 28.5 26.0 - 34.0 pg   MCHC 32.8 30.0 - 36.0 g/dL   RDW 14.2 11.5 - 15.5 %   Platelets 121 (L) 150 - 400 K/uL   Neutrophils Relative % 53 %   Neutro Abs 3.6 1.7 - 7.7 K/uL   Lymphocytes Relative 34 %   Lymphs Abs 2.2 0.7 - 4.0 K/uL   Monocytes Relative 11 %   Monocytes Absolute 0.7 0.1 - 1.0 K/uL   Eosinophils Relative 2 %   Eosinophils Absolute 0.1 0.0 - 0.7 K/uL   Basophils Relative 0 %   Basophils Absolute 0.0 0.0 - 0.1 K/uL  Salicylate level  Result Value Ref Range   Salicylate Lvl <6.5 2.8 - 30.0 mg/dL  Troponin I  Result Value Ref Range   Troponin I <0.03 <0.03 ng/mL   Ct Angio Head W/cm &/or Wo Cm Result Date: 02/20/2018 CLINICAL DATA:  56 year old female with vertigo, dizziness since last night. Hearing loss on the left side. Chest pain. Breast cancer. EXAM: CT ANGIOGRAPHY HEAD AND NECK TECHNIQUE: Multidetector CT imaging of the head and neck was performed using the standard protocol during bolus administration of intravenous contrast. Multiplanar CT image reconstructions and MIPs were obtained to evaluate the vascular anatomy. Carotid stenosis measurements (when applicable) are obtained utilizing NASCET  criteria, using the distal internal carotid diameter as the denominator. CONTRAST:  168mL ISOVUE-370 IOPAMIDOL (ISOVUE-370) INJECTION 76% COMPARISON:  Whole-body bone scan 05/26/2017. FINDINGS: CT HEAD Brain: Cerebral volume is within normal limits for age. Partially empty sella. No midline shift, ventriculomegaly, mass effect, evidence of mass lesion, intracranial hemorrhage or evidence of cortically based acute infarction. Gray-white matter differentiation is within normal limits throughout the brain. Calvarium and skull base: Within normal limits. Paranasal sinuses: Clear. Orbits: Disconjugate gaze. Otherwise negative orbits soft tissues. Visualized  scalp soft tissues are within normal limits. CTA NECK Skeleton: No acute or suspicious osseous lesion identified. Upper chest: 4 millimeter right upper lobe lung nodule on series 5, image 14. There also appears to be a 3-4 millimeter lung nodule along the right minor fissure on image 11. No superior mediastinal lymphadenopathy. Other neck: Negative.  No neck mass or lymphadenopathy. Aortic arch: Aberrancy origin of the right subclavian artery (normal variant). No arch or great vessel origin atherosclerosis. Right carotid system: Normal right CCA origin off of the arch. Mild if any plaque at the right carotid bifurcation. Negative cervical right ICA aside from partially retropharyngeal course. Left carotid system: Normal left CCA origin. Mildly tortuous proximal left CCA. Mild soft and calcified plaque at the left carotid bifurcation and left ICA origin without stenosis. Vertebral arteries: Aberrant origin of the right subclavian artery without stenosis. Non dominant and diminutive right vertebral artery is patent in the neck but appears to functionally terminates in muscular branches at the C1 level. Normal proximal left subclavian artery. Dominant left vertebral artery. Mild calcified plaque at the left vertebral origin without stenosis. Intermittent left V2 segment soft and calcified plaque without stenosis. Mild V3 segment plaque without stenosis. CTA HEAD Posterior circulation: Dominant left vertebral artery supplies the basilar. No distal left vertebral stenosis. Normal left PICA origin. The right AICA is dominant. Patent basilar artery without stenosis. Normal SCA and PCA origins. Posterior communicating arteries are diminutive or absent. Normal PCA branches. Anterior circulation: Patent ICA siphons. Minimal calcified plaque on the left. Mild calcified plaque on the right with no siphon stenosis. Normal ophthalmic artery origins. Patent carotid termini. Normal MCA and ACA origins. Diminutive anterior  communicating artery. Bilateral ACA branches appear normal. The left MCA M1 segment is mildly tortuous. The left M1, left MCA trifurcation, and left MCA branches are within normal limits. The right MCA M1 segment, trifurcation, and right MCA branches are within normal limits. Venous sinuses: Patent. Anatomic variants: Aberrant right subclavian artery origin. Dominant left vertebral artery and highly diminutive right vertebral artery which functionally terminates outside of the skull. Delayed phase: No abnormal enhancement identified. Review of the MIP images confirms the above findings IMPRESSION: 1. Negative for large vessel occlusion. Normal CT appearance of the brain. 2. Mild bilateral carotid and mild-to-moderate left vertebral artery atherosclerosis with no significant arterial stenosis. 3. Two small indeterminate right lung nodules are identified. Recommend routine follow-up Chest CT given history of breast cancer. 4. Aberrant origin of the right subclavian artery, and tiny non-dominant right vertebral artery which functionally terminates outside of the skull. Electronically Signed   By: Genevie Ann M.D.   On: 02/20/2018 17:08   Dg Chest 2 View Result Date: 02/20/2018 CLINICAL DATA:  57 year old female with a history of chest pain EXAM: CHEST - 2 VIEW COMPARISON:  CT 05/27/2017, chest x-ray 05/25/2017 FINDINGS: Cardiomediastinal silhouette unchanged in size and contour. No evidence of central vascular congestion. No pneumothorax. No pleural effusion. No confluent airspace disease. Left subclavian  approach port catheter with the tip appearing to terminate superior vena cava. IMPRESSION: Negative for acute cardiopulmonary disease Electronically Signed   By: Corrie Mckusick D.O.   On: 02/20/2018 15:24   Ct Angio Neck W And/or Wo Contrast Result Date: 02/20/2018 CLINICAL DATA:  57 year old female with vertigo, dizziness since last night. Hearing loss on the left side. Chest pain. Breast cancer. EXAM: CT  ANGIOGRAPHY HEAD AND NECK TECHNIQUE: Multidetector CT imaging of the head and neck was performed using the standard protocol during bolus administration of intravenous contrast. Multiplanar CT image reconstructions and MIPs were obtained to evaluate the vascular anatomy. Carotid stenosis measurements (when applicable) are obtained utilizing NASCET criteria, using the distal internal carotid diameter as the denominator. CONTRAST:  146mL ISOVUE-370 IOPAMIDOL (ISOVUE-370) INJECTION 76% COMPARISON:  Whole-body bone scan 05/26/2017. FINDINGS: CT HEAD Brain: Cerebral volume is within normal limits for age. Partially empty sella. No midline shift, ventriculomegaly, mass effect, evidence of mass lesion, intracranial hemorrhage or evidence of cortically based acute infarction. Gray-white matter differentiation is within normal limits throughout the brain. Calvarium and skull base: Within normal limits. Paranasal sinuses: Clear. Orbits: Disconjugate gaze. Otherwise negative orbits soft tissues. Visualized scalp soft tissues are within normal limits. CTA NECK Skeleton: No acute or suspicious osseous lesion identified. Upper chest: 4 millimeter right upper lobe lung nodule on series 5, image 14. There also appears to be a 3-4 millimeter lung nodule along the right minor fissure on image 11. No superior mediastinal lymphadenopathy. Other neck: Negative.  No neck mass or lymphadenopathy. Aortic arch: Aberrancy origin of the right subclavian artery (normal variant). No arch or great vessel origin atherosclerosis. Right carotid system: Normal right CCA origin off of the arch. Mild if any plaque at the right carotid bifurcation. Negative cervical right ICA aside from partially retropharyngeal course. Left carotid system: Normal left CCA origin. Mildly tortuous proximal left CCA. Mild soft and calcified plaque at the left carotid bifurcation and left ICA origin without stenosis. Vertebral arteries: Aberrant origin of the right  subclavian artery without stenosis. Non dominant and diminutive right vertebral artery is patent in the neck but appears to functionally terminates in muscular branches at the C1 level. Normal proximal left subclavian artery. Dominant left vertebral artery. Mild calcified plaque at the left vertebral origin without stenosis. Intermittent left V2 segment soft and calcified plaque without stenosis. Mild V3 segment plaque without stenosis. CTA HEAD Posterior circulation: Dominant left vertebral artery supplies the basilar. No distal left vertebral stenosis. Normal left PICA origin. The right AICA is dominant. Patent basilar artery without stenosis. Normal SCA and PCA origins. Posterior communicating arteries are diminutive or absent. Normal PCA branches. Anterior circulation: Patent ICA siphons. Minimal calcified plaque on the left. Mild calcified plaque on the right with no siphon stenosis. Normal ophthalmic artery origins. Patent carotid termini. Normal MCA and ACA origins. Diminutive anterior communicating artery. Bilateral ACA branches appear normal. The left MCA M1 segment is mildly tortuous. The left M1, left MCA trifurcation, and left MCA branches are within normal limits. The right MCA M1 segment, trifurcation, and right MCA branches are within normal limits. Venous sinuses: Patent. Anatomic variants: Aberrant right subclavian artery origin. Dominant left vertebral artery and highly diminutive right vertebral artery which functionally terminates outside of the skull. Delayed phase: No abnormal enhancement identified. Review of the MIP images confirms the above findings IMPRESSION: 1. Negative for large vessel occlusion. Normal CT appearance of the brain. 2. Mild bilateral carotid and mild-to-moderate left vertebral artery atherosclerosis with no  significant arterial stenosis. 3. Two small indeterminate right lung nodules are identified. Recommend routine follow-up Chest CT given history of breast cancer. 4.  Aberrant origin of the right subclavian artery, and tiny non-dominant right vertebral artery which functionally terminates outside of the skull. Electronically Signed   By: Genevie Ann M.D.   On: 02/20/2018 17:08    1900:  Given pt's symptoms of left ear ringing and vertigo which worsens when she places her head back; neck vessel dissection was considered and CT-A was completed. Results as above.  Antivert given for vertigo symptoms with improvement. Pt tol PO well while in the ED without N/V. Pt has ambulated with steady gait, easy resps. Doubt PE as cause for upper chest wall/clavicle symptoms given no complaints of SOB, no hypoxia, no calf/LE pain or unilateral swelling.  Doubt ACS as cause for symptoms with normal troponin x2 and unchanged EKG from previous after 9 hours of constant symptoms. Pt feels better after meds and wants to go home now. When given results of Udip; pt does endorse urinary frequency. Will rx keflex. Tx vertigo with antivert. Dx and testing d/w pt and family.  Questions answered.  Verb understanding, agreeable to d/c home with outpt f/u.     Final Clinical Impressions(s) / ED Diagnoses   Final diagnoses:  None    ED Discharge Orders    None       Francine Graven, DO 02/26/18 0818

## 2018-02-20 NOTE — ED Triage Notes (Signed)
Pt with hx of Cancer beast C surgery in Feb (R)  New med Fluticasone and was told could give her pain but her pain is L sided with N intermittently since last night

## 2018-02-20 NOTE — Discharge Instructions (Addendum)
Your CT scan showed an incidental finding:  "Upper chest: 4 millimeter right upper lobe lung nodule. There also appears to be a 3-4 millimeter lung nodule along the right minor fissure."  Your Oncologist can follow up this finding. Take the prescriptions as directed.  Apply moist heat or ice to the area(s) of discomfort, for 15 minutes at a time, several times per day for the next few days.  Do not fall asleep on a heating or ice pack.  Call your regular medical doctor and your Oncologist on Monday to schedule a follow up appointment in the next 3 days. Return to the Emergency Department immediately if worsening.

## 2018-02-20 NOTE — ED Notes (Signed)
Pt drinking water without difficulty.

## 2018-02-22 ENCOUNTER — Telehealth (HOSPITAL_COMMUNITY): Payer: Self-pay

## 2018-02-22 ENCOUNTER — Ambulatory Visit
Admission: RE | Admit: 2018-02-22 | Discharge: 2018-02-22 | Disposition: A | Discharge status: Home / Self Care | Payer: 59 | Source: Ambulatory Visit | Attending: Radiation Oncology | Admitting: Radiation Oncology

## 2018-02-22 DIAGNOSIS — C50211 Malignant neoplasm of upper-inner quadrant of right female breast: Secondary | ICD-10-CM | POA: Diagnosis not present

## 2018-02-22 NOTE — Telephone Encounter (Signed)
Patient called today today to let Dr. Walden Field know she was in the ER on Saturday for chest pain. Per patient, she said it was just chest wall pain, no evidence of heart attack, she did have vertigo, and a kidney infection. She did have two new spots show up on her lungs when they did the CT Scan. Will notify Dr. Walden Field. Patient starts radiation today.

## 2018-02-23 ENCOUNTER — Ambulatory Visit
Admission: RE | Admit: 2018-02-23 | Discharge: 2018-02-23 | Disposition: A | Discharge status: Home / Self Care | Payer: 59 | Source: Ambulatory Visit | Attending: Radiation Oncology | Admitting: Radiation Oncology

## 2018-02-23 ENCOUNTER — Emergency Department (HOSPITAL_COMMUNITY): Payer: 59

## 2018-02-23 ENCOUNTER — Other Ambulatory Visit: Payer: Self-pay

## 2018-02-23 ENCOUNTER — Encounter (HOSPITAL_COMMUNITY): Payer: Self-pay | Admitting: Emergency Medicine

## 2018-02-23 ENCOUNTER — Emergency Department (HOSPITAL_COMMUNITY)
Admission: EM | Admit: 2018-02-23 | Discharge: 2018-02-23 | Disposition: A | Discharge status: Home / Self Care | Payer: 59 | Attending: Emergency Medicine | Admitting: Emergency Medicine

## 2018-02-23 DIAGNOSIS — Z853 Personal history of malignant neoplasm of breast: Secondary | ICD-10-CM | POA: Insufficient documentation

## 2018-02-23 DIAGNOSIS — J45909 Unspecified asthma, uncomplicated: Secondary | ICD-10-CM | POA: Diagnosis not present

## 2018-02-23 DIAGNOSIS — Z91040 Latex allergy status: Secondary | ICD-10-CM | POA: Insufficient documentation

## 2018-02-23 DIAGNOSIS — R109 Unspecified abdominal pain: Secondary | ICD-10-CM | POA: Diagnosis not present

## 2018-02-23 DIAGNOSIS — Z9049 Acquired absence of other specified parts of digestive tract: Secondary | ICD-10-CM | POA: Diagnosis not present

## 2018-02-23 DIAGNOSIS — E119 Type 2 diabetes mellitus without complications: Secondary | ICD-10-CM | POA: Diagnosis not present

## 2018-02-23 DIAGNOSIS — C50211 Malignant neoplasm of upper-inner quadrant of right female breast: Secondary | ICD-10-CM | POA: Diagnosis not present

## 2018-02-23 DIAGNOSIS — Z79899 Other long term (current) drug therapy: Secondary | ICD-10-CM | POA: Insufficient documentation

## 2018-02-23 DIAGNOSIS — R079 Chest pain, unspecified: Secondary | ICD-10-CM

## 2018-02-23 DIAGNOSIS — I10 Essential (primary) hypertension: Secondary | ICD-10-CM | POA: Insufficient documentation

## 2018-02-23 LAB — I-STAT BETA HCG BLOOD, ED (MC, WL, AP ONLY): I-stat hCG, quantitative: <5 m[IU]/mL (ref <5)

## 2018-02-23 LAB — BASIC METABOLIC PANEL
Anion gap: 13 (ref 5–15)
BUN: 14 mg/dL (ref 6–20)
CO2: 26 mmol/L (ref 22–32)
Calcium: 11.5 mg/dL — ABNORMAL HIGH (ref 8.9–10.3)
Chloride: 98 mmol/L — ABNORMAL LOW (ref 101–111)
Creatinine, Ser: 0.94 mg/dL (ref 0.44–1.00)
GFR calc Af Amer: >60 mL/min (ref >60)
GFR calc non Af Amer: >60 mL/min (ref >60)
Glucose, Bld: 149 mg/dL — ABNORMAL HIGH (ref 65–99)
Potassium: 3.7 mmol/L (ref 3.5–5.1)
Sodium: 137 mmol/L (ref 135–145)

## 2018-02-23 LAB — URINALYSIS, MICROSCOPIC (REFLEX)

## 2018-02-23 LAB — URINALYSIS, ROUTINE W REFLEX MICROSCOPIC
Glucose, UA: NEGATIVE mg/dL
Ketones, ur: NEGATIVE mg/dL
Nitrite: NEGATIVE
Protein, ur: 30 mg/dL — AB
Specific Gravity, Urine: 1.025 (ref 1.005–1.030)
pH: 6 (ref 5.0–8.0)

## 2018-02-23 LAB — CBC
HCT: 39.8 % (ref 36.0–46.0)
Hemoglobin: 13.6 g/dL (ref 12.0–15.0)
MCH: 29.1 pg (ref 26.0–34.0)
MCHC: 34.2 g/dL (ref 30.0–36.0)
MCV: 85.2 fL (ref 78.0–100.0)
Platelets: 132 10*3/uL — ABNORMAL LOW (ref 150–400)
RBC: 4.67 MIL/uL (ref 3.87–5.11)
RDW: 14.1 % (ref 11.5–15.5)
WBC: 7.4 10*3/uL (ref 4.0–10.5)

## 2018-02-23 LAB — I-STAT TROPONIN, ED: Troponin i, poc: 0.01 ng/mL (ref 0.00–0.08)

## 2018-02-23 MED ORDER — SODIUM CHLORIDE 0.9 % IV SOLN
INTRAVENOUS | Status: DC
  Administered 2018-02-23: 15:00:00 via INTRAVENOUS

## 2018-02-23 MED ORDER — IOPAMIDOL (ISOVUE-300) INJECTION 61%
100.0000 mL | Freq: Once | INTRAVENOUS | Status: AC | PRN
  Administered 2018-02-23: 100 mL via INTRAVENOUS

## 2018-02-23 MED ORDER — IOPAMIDOL (ISOVUE-300) INJECTION 61%
INTRAVENOUS | Status: AC
  Filled 2018-02-23: qty 100

## 2018-02-23 MED ORDER — PANTOPRAZOLE SODIUM 20 MG PO TBEC: 20.0000 mg | DELAYED_RELEASE_TABLET | Freq: Every day | ORAL | 0 refills | Status: DC

## 2018-02-23 MED ORDER — HEPARIN SOD (PORK) LOCK FLUSH 100 UNIT/ML IV SOLN
500.0000 [IU] | Freq: Once | INTRAVENOUS | Status: AC
  Administered 2018-02-23: 500 [IU]
  Filled 2018-02-23: qty 5

## 2018-02-23 MED ORDER — HYDROMORPHONE HCL 1 MG/ML IJ SOLN
0.7500 mg | Freq: Once | INTRAMUSCULAR | Status: AC
  Administered 2018-02-23: 0.75 mg via INTRAVENOUS
  Filled 2018-02-23: qty 1

## 2018-02-23 NOTE — ED Notes (Signed)
Patient transported to CT 

## 2018-02-23 NOTE — ED Notes (Signed)
ED Provider at bedside. 

## 2018-02-23 NOTE — ED Provider Notes (Signed)
[facility] DEPT Provider Note   CSN: [id number] Arrival date & time: 02/23/18  1200     History   Chief Complaint Chief Complaint  Patient presents with  . Chest Pain  . Abdominal Pain    HPI Renee Harris is a 57 y.o. female.  HPI  57 year old female with left-sided chest pain and right-sided abdominal pain.  Patient has been having ongoing chest pain since around this past Friday.  She describes intermittent sharp pain in her left anterior/upper chest.  She was seen in the emergency room and [facility] for the same complaint.  Was felt to be musculoskeletal.  She had a negative work-up.  At that time she is also complaining of vertigo and had negative CT angiogram of head and neck.  She has been taking as needed medication with some improvement of this.  The past 2 days she has developed right-sided abdominal pain.  She says the pain is constant.  No appreciable exacerbating relieving factors.  She has no urinary complaints.  She states that she has not had a bowel movement in almost a week.  Past Medical History:  Diagnosis Date  . Asthma   . Blood transfusion    as child  . Cancer ([place])   . Diabetes ([place])    type 2   . GERD (gastroesophageal reflux disease)   . Headache   . Heart murmur    d/t aortic stenosis   . History of kidney stones   . Hyperlipidemia   . Hypertension   . Normal echocardiogram   . Sleep apnea     Patient Active Problem List   Diagnosis Date Noted  . Weight loss 06/13/2017  . Malignant neoplasm of upper-inner quadrant of right breast in female, estrogen receptor positive ([place]) 04/16/2017    Past Surgical History:  Procedure Laterality Date  . ABDOMINAL HYSTERECTOMY  2004  . BREAST LUMPECTOMY WITH RADIOACTIVE SEED AND SENTINEL LYMPH NODE BIOPSY Right 12/09/2017   Procedure: RIGHT BREAST LUMPECTOMY WITH RADIOACTIVE SEED AND SENTINEL LYMPH NODE BIOPSY AND RADIOACTIVE SEED TARGETED RIGHT AXILLARY LYMPH NODE  EXCISION;  Surgeon: Alphonsa Overall, MD;  Location: [facility];  Service: General;  Laterality: Right;  . CHOLECYSTECTOMY  05/30/2011   Procedure: LAPAROSCOPIC CHOLECYSTECTOMY;  Surgeon: Donato Heinz;  Location: [facility];  Service: General;  Laterality: N/A;  . 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 KIDNEY STONE SURGERY     2017 2-23  . PORTACATH PLACEMENT N/A 05/25/2017   Procedure: INSERTION PORT-A-CATH;  Surgeon: Alphonsa Overall, MD;  Location: [facility];  Service: General;  Laterality: N/A;     OB History   None      Home Medications    Prior to Admission medications   Medication Sig Start Date End Date Taking? Authorizing Provider  acetaminophen (TYLENOL) 500 MG tablet Take 1,000 mg by mouth daily as needed for moderate pain or headache.   Yes [provider]  Ado-Trastuzumab Emtansine (KADCYLA IV) Inject into the vein.   Yes [provider]  albuterol (PROVENTIL HFA;VENTOLIN HFA) 108 (90 BASE) MCG/ACT inhaler Inhale 1 puff into the lungs every 6 (six) hours as needed for wheezing or shortness of breath.    Yes [provider]  cephALEXin (KEFLEX) 500 MG capsule Take 1 capsule (500 mg total) by mouth 4 (four) times daily. 02/20/18  Yes Francine Graven, DO  diphenhydrAMINE (BENADRYL) 25 mg capsule Take 25 mg by  mouth at bedtime.    Yes [provider]  diphenoxylate-atropine (LOMOTIL) 2.5-0.025 MG tablet TAKE (1) TABLET BY MOUTH (4) TIMES DAILY AS NEEDED FOR DIARRHEA OR LOOSE STOOLS. 09/29/17  Yes Holley Bouche, NP  Docusate Sodium (STOOL SOFTENER LAXATIVE PO) Take 1 capsule by mouth daily as needed (For constipation.).   Yes [provider]  escitalopram (LEXAPRO) 20 MG tablet Take 10 mg by mouth at bedtime.    Yes [provider]  fenofibrate 160 MG tablet Take 160 mg by mouth at bedtime.    Yes [provider]  fluticasone (FLONASE) 50 MCG/ACT nasal spray Place 1 spray into both nostrils daily as  needed for allergies or rhinitis.   Yes [provider]  HYDROcodone-acetaminophen (NORCO/VICODIN) 5-325 MG tablet Take 1 tablet by mouth every 6 (six) hours as needed for moderate pain. 12/09/17  Yes Alphonsa Overall, MD  letrozole [facility]) 2.5 MG tablet Take 1 tablet (2.5 mg total) by mouth daily. 01/12/18  Yes Higgs, Mathis Dad, MD  lidocaine-prilocaine (EMLA) cream Apply 1 application topically daily as needed (port access).  10/05/17  Yes [provider]  loperamide (IMODIUM A-D) 2 MG tablet Take 4 mg by mouth as needed for diarrhea or loose stools.   Yes [provider]  losartan (COZAAR) 50 MG tablet Take 25 mg by mouth at bedtime.    Yes [provider]  magic mouthwash w/lidocaine SOLN Take 5 mLs by mouth 4 (four) times daily as needed for mouth pain. 07/17/17  Yes Twana First, MD  meclizine (ANTIVERT) 25 MG tablet Take 1 tablet (25 mg total) by mouth 3 (three) times daily as needed for dizziness. 02/20/18  Yes Francine Graven, DO  Multiple Vitamin (MULTIVITAMIN) tablet Take 1 tablet by mouth at bedtime.    Yes [provider]  neomycin-bacitracin-polymyxin (NEOSPORIN) ointment Apply 1 application topically as needed for wound care.   Yes [provider]  omeprazole (PRILOSEC) 40 MG capsule Take 1 capsule (40 mg total) by mouth daily. Patient taking differently: Take 40 mg by mouth at bedtime.  11/03/17  Yes Holley Bouche, NP  ondansetron (ZOFRAN ODT) 4 MG disintegrating tablet Take 1 tablet (4 mg total) by mouth every 8 (eight) hours as needed for nausea or vomiting. 02/20/18  Yes Francine Graven, DO  oxyCODONE (ROXICODONE) 5 MG immediate release tablet Take 1 tablet (5 mg total) by mouth every 6 (six) hours as needed for moderate pain or severe pain. 02/20/18  Yes Francine Graven, DO  prochlorperazine (COMPAZINE) 10 MG tablet TAKE (1) TABLET BY MOUTH EVERY SIX HOURS AS NEEDED FOR NAUSEA AND VOMITING. 08/05/17  Yes Nicholas Lose, MD  Skin  Protectants, Misc. (EUCERIN) cream Apply 1 application topically as needed for dry skin.   Yes [provider]    Family History Family History  Problem Relation Age of Onset  . Lung cancer Maternal Grandmother   . Anesthesia problems Neg Hx   . Hypotension Neg Hx   . Malignant hyperthermia Neg Hx   . Pseudochol deficiency Neg Hx     Social History Social History   Tobacco Use  . Smoking status: Never Smoker  . Smokeless tobacco: Never Used  Substance Use Topics  . Alcohol use: Yes    Comment: 2 x year  . Drug use: No     Allergies   Latex; Pyridostigmine bromide; and Statins   Review of Systems Review of Systems  All systems reviewed and negative, other than as noted in HPI.  Physical Exam Updated Vital Signs BP (!) 179/100   Pulse 80   Resp 17   SpO2 96%   Physical Exam  Constitutional: She appears well-developed and well-nourished. No distress.  HENT:  Head: Normocephalic and atraumatic.  Eyes: Conjunctivae are normal. Right eye exhibits no discharge. Left eye exhibits no discharge.  Neck: Neck supple.  Cardiovascular: Normal rate, regular rhythm and normal heart sounds. Exam reveals no gallop and no friction rub.  No murmur heard. TTP L anterior chest. No overlying skin changes.  Pulmonary/Chest: Effort normal and breath sounds normal. No respiratory distress.  Abdominal: Soft. She exhibits no distension. There is tenderness.  Ventral hernia which is soft. TTP R abdomen into R flank. No rebound or guarding.   Musculoskeletal: She exhibits no edema or tenderness.  Neurological: She is alert.  Skin: Skin is warm and dry.  Psychiatric: She has a normal mood and affect. Her behavior is normal. Thought content normal.  Nursing note and vitals reviewed.    ED Treatments / Results  Labs (all labs ordered are listed, but only abnormal results are displayed) Labs Reviewed  BASIC METABOLIC PANEL - Abnormal; Notable for the following components:        Result Value   Chloride 98 (*)    Glucose, Bld 149 (*)    Calcium 11.5 (*)    All other components within normal limits  CBC - Abnormal; Notable for the following components:   Platelets 132 (*)    All other components within normal limits  URINALYSIS, ROUTINE W REFLEX MICROSCOPIC - Abnormal; Notable for the following components:   Hgb urine dipstick TRACE (*)    Bilirubin Urine SMALL (*)    Protein, ur 30 (*)    Leukocytes, UA SMALL (*)    All other components within normal limits  URINALYSIS, MICROSCOPIC (REFLEX) - Abnormal; Notable for the following components:   Bacteria, UA RARE (*)    All other components within normal limits  I-STAT TROPONIN, ED  I-STAT BETA HCG BLOOD, ED ([facility], [facility], [facility] ONLY)    EKG EKG Interpretation  Date/Time:  Tuesday Feb 23 2018 12:38:41 EDT Ventricular Rate:  90 PR Interval:    QRS Duration: 89 QT Interval:  369 QTC Calculation: 452 R Axis:   -91 Text Interpretation:  Sinus rhythm Left anterior fascicular block Low voltage, precordial leads No significant change since last tracing Confirmed by Virgel Manifold [phone]) on 02/23/2018 1:11:32 PM   Radiology Dg Chest 2 View  Result Date: 02/23/2018 CLINICAL DATA:  Chest pain. EXAM: CHEST - 2 VIEW COMPARISON:  Radiographs of Feb 20, 2018. FINDINGS: The heart size and mediastinal contours are within normal limits. Both lungs are clear. No pneumothorax or pleural effusion is noted. Left subclavian Port-A-Cath is noted with tip in expected position of right atrium. The visualized skeletal structures are unremarkable. IMPRESSION: No active cardiopulmonary disease. Electronically Signed   By: Marijo Conception, M.D.   On: 02/23/2018 13:22   Ct Abdomen Pelvis W Contrast  Result Date: 02/23/2018 CLINICAL DATA:  Abdominal pain and constipation EXAM: CT ABDOMEN AND PELVIS WITH CONTRAST TECHNIQUE: Multidetector CT imaging of the abdomen and pelvis was performed using the standard protocol following bolus  administration of intravenous contrast. CONTRAST:  143mL ISOVUE-300 IOPAMIDOL (ISOVUE-300) INJECTION 61% COMPARISON:  05/27/2017 FINDINGS: Lower chest: Lung bases are free of acute infiltrate or sizable effusion. Hepatobiliary: Fatty infiltration of the liver is noted. The gallbladder has been surgically removed. Pancreas: Pancreas is within normal limits. Spleen: Normal  in size without focal abnormality. Adrenals/Urinary Tract: Adrenal glands are within normal limits. Small nonobstructing renal stones are again identified bilaterally. No ureteral calculi are seen. The bladder is decompressed. Stomach/Bowel: Scattered diverticular change of the colon is noted without evidence of diverticulitis. The appendix is within normal limits. No obstructive changes of the small bowel are noted. A large anterior abdominal wall hernia is again identified with multiple loops of small bowel within although no obstructive changes are noted. Vascular/Lymphatic: No significant vascular findings are present. No enlarged abdominal or pelvic lymph nodes. Reproductive: Status post hysterectomy. No adnexal masses. Other: No abdominal wall hernia or abnormality. No abdominopelvic ascites. Musculoskeletal: Likely bone islands are again noted in the pelvic bones stable from the prior exam. IMPRESSION: Stable anterior abdominal wall hernia with multiple loops of small bowel within. No obstructive changes are noted. Chronic changes as described above without acute abnormality. Electronically Signed   By: Inez Catalina M.D.   On: 02/23/2018 15:36    Procedures Procedures (including critical care time)  Medications Ordered in ED Medications  0.9 %  sodium chloride infusion ( Intravenous New Bag/Given 02/23/18 1444)  iopamidol (ISOVUE-300) 61 % injection (has no administration in time range)  HYDROmorphone (DILAUDID) injection 0.75 mg (0.75 mg Intravenous Given 02/23/18 1443)  iopamidol (ISOVUE-300) 61 % injection 100 mL (100 mLs  Intravenous Contrast Given 02/23/18 1509)     Initial Impression / Assessment and Plan / ED Course  I have reviewed the triage vital signs and the nursing notes.  Pertinent labs & imaging results that were available during my care of the patient were reviewed by me and considered in my medical decision making (see chart for details).    Final Clinical Impressions(s) / ED Diagnoses   Final diagnoses:  Abdominal pain, unspecified abdominal location  Chest pain, unspecified type   57 year old female with continued left-sided chest pain.  It sounds atypical for ACS to me.  Doubt PE, dissection or other emergent process.  It is reproducible with palpation.  Her EKG does not look acutely changed from her prior one.  Troponin is still normal.  R sided abdominal pain not clearly related. She does have some mild tenderness on exam but certainly no peritonitis.  She is nondistended. CT of the abdomen & pelvis without acute pathology.  Clinically pain is not from this hernia. She is already on keflex for UTI diagnosed a couple days ago.     ED Discharge Orders    None       Virgel Manifold, MD 02/24/18 1228

## 2018-02-23 NOTE — ED Triage Notes (Signed)
Patient here from home with complaints of chest pain and abdominal pain. Breast cancer patient-radiation. Constipation. Last BM 5/5

## 2018-02-24 ENCOUNTER — Ambulatory Visit
Admission: RE | Admit: 2018-02-24 | Discharge: 2018-02-24 | Disposition: A | Discharge status: Home / Self Care | Payer: 59 | Source: Ambulatory Visit | Attending: Radiation Oncology | Admitting: Radiation Oncology

## 2018-02-24 DIAGNOSIS — C50211 Malignant neoplasm of upper-inner quadrant of right female breast: Secondary | ICD-10-CM | POA: Diagnosis not present

## 2018-02-25 ENCOUNTER — Ambulatory Visit
Admission: RE | Admit: 2018-02-25 | Discharge: 2018-02-25 | Disposition: A | Discharge status: Home / Self Care | Payer: 59 | Source: Ambulatory Visit | Attending: Radiation Oncology | Admitting: Radiation Oncology

## 2018-02-25 ENCOUNTER — Inpatient Hospital Stay (HOSPITAL_COMMUNITY): Payer: 59 | Attending: Internal Medicine | Admitting: Internal Medicine

## 2018-02-25 ENCOUNTER — Other Ambulatory Visit: Payer: Self-pay

## 2018-02-25 ENCOUNTER — Encounter (HOSPITAL_COMMUNITY): Payer: Self-pay | Admitting: Internal Medicine

## 2018-02-25 VITALS — BP 142/72 | HR 83 | Temp 98.3°F | Resp 18 | Wt 243.7 lb

## 2018-02-25 DIAGNOSIS — J45909 Unspecified asthma, uncomplicated: Secondary | ICD-10-CM | POA: Diagnosis not present

## 2018-02-25 DIAGNOSIS — Z87442 Personal history of urinary calculi: Secondary | ICD-10-CM | POA: Diagnosis not present

## 2018-02-25 DIAGNOSIS — K5900 Constipation, unspecified: Secondary | ICD-10-CM | POA: Diagnosis not present

## 2018-02-25 DIAGNOSIS — I10 Essential (primary) hypertension: Secondary | ICD-10-CM | POA: Diagnosis not present

## 2018-02-25 DIAGNOSIS — E785 Hyperlipidemia, unspecified: Secondary | ICD-10-CM

## 2018-02-25 DIAGNOSIS — R109 Unspecified abdominal pain: Secondary | ICD-10-CM | POA: Diagnosis not present

## 2018-02-25 DIAGNOSIS — R918 Other nonspecific abnormal finding of lung field: Secondary | ICD-10-CM | POA: Insufficient documentation

## 2018-02-25 DIAGNOSIS — Z79899 Other long term (current) drug therapy: Secondary | ICD-10-CM | POA: Diagnosis not present

## 2018-02-25 DIAGNOSIS — G4730 Sleep apnea, unspecified: Secondary | ICD-10-CM | POA: Insufficient documentation

## 2018-02-25 DIAGNOSIS — K219 Gastro-esophageal reflux disease without esophagitis: Secondary | ICD-10-CM

## 2018-02-25 DIAGNOSIS — M899 Disorder of bone, unspecified: Secondary | ICD-10-CM | POA: Diagnosis not present

## 2018-02-25 DIAGNOSIS — N6489 Other specified disorders of breast: Secondary | ICD-10-CM

## 2018-02-25 DIAGNOSIS — R634 Abnormal weight loss: Secondary | ICD-10-CM | POA: Insufficient documentation

## 2018-02-25 DIAGNOSIS — C50211 Malignant neoplasm of upper-inner quadrant of right female breast: Secondary | ICD-10-CM | POA: Diagnosis not present

## 2018-02-25 DIAGNOSIS — I700 Atherosclerosis of aorta: Secondary | ICD-10-CM | POA: Diagnosis not present

## 2018-02-25 DIAGNOSIS — K439 Ventral hernia without obstruction or gangrene: Secondary | ICD-10-CM | POA: Diagnosis not present

## 2018-02-25 DIAGNOSIS — Z170 Estrogen receptor positive status [ER+]: Secondary | ICD-10-CM | POA: Insufficient documentation

## 2018-02-25 DIAGNOSIS — Z9221 Personal history of antineoplastic chemotherapy: Secondary | ICD-10-CM | POA: Diagnosis not present

## 2018-02-25 DIAGNOSIS — E119 Type 2 diabetes mellitus without complications: Secondary | ICD-10-CM | POA: Diagnosis not present

## 2018-02-25 DIAGNOSIS — K760 Fatty (change of) liver, not elsewhere classified: Secondary | ICD-10-CM | POA: Insufficient documentation

## 2018-02-25 DIAGNOSIS — I2510 Atherosclerotic heart disease of native coronary artery without angina pectoris: Secondary | ICD-10-CM | POA: Diagnosis not present

## 2018-02-25 DIAGNOSIS — R599 Enlarged lymph nodes, unspecified: Secondary | ICD-10-CM | POA: Insufficient documentation

## 2018-02-25 DIAGNOSIS — Z801 Family history of malignant neoplasm of trachea, bronchus and lung: Secondary | ICD-10-CM | POA: Insufficient documentation

## 2018-02-25 DIAGNOSIS — Z79811 Long term (current) use of aromatase inhibitors: Secondary | ICD-10-CM | POA: Diagnosis not present

## 2018-02-25 DIAGNOSIS — M2550 Pain in unspecified joint: Secondary | ICD-10-CM

## 2018-02-25 NOTE — Progress Notes (Signed)
Diagnosis No diagnosis found.  Staging Cancer Staging Malignant neoplasm of upper-inner quadrant of right breast in female, estrogen receptor positive ([place]) Staging form: Breast, AJCC 8th Edition - Clinical stage from 04/22/2017: Stage IB (cT2, cN0, cM0, G3, ER: Positive, PR: Positive, HER2: Positive) - Unsigned Staging comments: Staged at breast conference on 7.11.18   Assessment and Plan:   1.  pT1cN1a Stage IIA  invasive ductal carcinoma of (R) breast; ER+/PR+/HER2+. Pt had evidence of residual disease with one positive lymph node on definitive surgery that was done on December 09, 2017.  She is recommended for adjuvant endocrine therapy with Femara 2.5 mg p.o. daily and can start the medication with RT and Kadcyla.   Options discussed  due to residual disease on adjuvant Herceptin and Perjeta would be to switch to Kadcyla  3.6 mg/kg every 3 weeks for 14 cycles based on results of the recent  [facility].    She has completed C2 of Kadcyla last treated 02/10/2018.   Last ECHO done 02/01/2018 showed EF 60-65%.  She will have repeat ECHO in 04/2018.  She has started RT.  She reports she has discontinue Femara several days ago.  She would like to remain off Femara until RT completed to determine if some of her symptoms improve.  She will be seen for follow-up prior to [place].    2.  Pulmonary nodule.  I have discussed with the pt that imaging done 05/27/2017 showed  IMPRESSION: Chest Impression:  1. Metastatic adenopathy to the RIGHT axilla.  2. No evidence of intrathoracic nodal metastasis.  3. Two RIGHT breast lesions noted.  4. Small RIGHT upper lobe pulmonary nodule favored benign. Recommend follow-up on routine surveillance  Abdomen / Pelvis Impression:  1. No evidence of metastatic disease in soft tissues abdomen pelvis. 2. Hepatic steatosis. 3. Ventral hernia contains a long segment of nonobstructed small bowel. 4. Two sclerotic lesions in the pelvis without  corresponding findings on bone scan. Favor benign lesions but recommend attention on follow-up.  She was seen in ER and due to chest wall discomfort she underwent CT angio of chest on 02/20/2018 that showed  IMPRESSION: 1. Negative for large vessel occlusion. Normal CT appearance of the brain. 2. Mild bilateral carotid and mild-to-moderate left vertebral artery atherosclerosis with no significant arterial stenosis. 3. Two small indeterminate right lung nodules are identified. Recommend routine follow-up Chest CT given history of breast cancer. 4. Aberrant origin of the right subclavian artery, and tiny non-dominant right vertebral artery which functionally terminates outside of the skull.  CT of the abdomen and pelvis was done on 02/23/2018 and showed  IMPRESSION: Stable anterior abdominal wall hernia with multiple loops of small bowel within. No obstructive changes are noted.  Chronic changes as described above without acute abnormality.  I have discussed with her imaging that was done and previously reportedly reviewed with pt by NP Renato Battles showed pulmonary nodule in right lung on CT chest done 05/2017.  I discussed with her lesion was small and felt to be benign.  She will be set up for repeat imaging with CT or PET scan 6-8 weeks after RT completed for ongoing follow-up.   3.  Constipation/abdominal pain.  She is advised to get on an aggressive stool regimen and to notify the office if symptoms fail to improve.  Recent CT of the abdomen done 02/23/2018 showed no obstruction.  Will repeat chemistries prior to next Kadcyla.    4.  Joint pain.  She reports this  improved after she stopped Femara on her own several days ago.  She desires to remain off Femara until RT completed.  She will be given the option of resuming therapy or changing to another AI.  Pt has not had Dexa scan done as previously recommended.    5.  Hypertension.  BP is 142/72.   Follow-up with PCP.    6.  Health  maintenance.  Pt has not undergone colonoscopy or Bone density.    Interval History:  57 yr old female with  pT1cN1a Stage IIA  invasive ductal carcinoma of (R) breast; ER+/PR+/HER2+.  Pt had screening mammogram in 03/2017 for (R) breast abnormality. Diagnostic mammogram/ultrasound of (R) breast suspicious for malignancy. Underwent (R) breast biopsy on 04/13/17 revealing invasive ductal carcinoma, grade 3, ER+, PR+, HER2+. Seen initially in consultation with Dr. Lindi Adie at [facility], and patient elected to have her treatment closer to home and presented to [facility] for cycle #1 neoadjuvant chemo with [facility] on 05/29/17. Marland Kitchen   Pt completed neoadjuvant [facility] chemo on 10/09/17. She was being followed by NP Renato Battles and Dr. Sherrine Maples.   She was scheduled to see her surgeon, Dr. Lucia Gaskins, but she had to reschedule this appointment due to  recent injury that her husband sustained.  It is very important to her that her husband be present for this appointment.    Treatment breast MRI showed only partial response to both the primary breast mass and axillary adenopathy. MRI done 11/11/2017 showed right breast lesion now measuring 2.3 cm previously 2.7 cm per MRI report.  ECHO done 11/02/2017 showed normal EF 60-65%.  She has undergone right lumpectomy on 12/09/2017.  Lesion #1 of the right breast was intraductal papilloma.  There was no evidence of malignancy.  Lesion to of the right breast showed invasive ductal carcinoma grade 3 measuring 2 cm and  there was evidence of high-grade DCIS.  She had one lymph node positive for disease.  Nipple biopsy on the right showed no malignancy.  Margins were greater than 0.2 cm in all final margins.  Her tumor was ER +100% PR +70% HER-2 was positive by FISH with a ratio of 2.37.  Ki 67 was 60%.  She had evidence of residual disease with one positive lymph node on definitive surgery that was done on December 09, 2017.  She is recommended for adjuvant endocrine therapy with Femara 2.5 mg p.o.  daily and can start the medication with RT and Kadcyla.   Options discussed  due to residual disease on adjuvant Herceptin and Perjeta would be to switch to Kadcyla  3.6 mg/kg every 3 weeks for 14 cycles based on results of the recent  [facility].    Current Status:  Pt seen today for follow-up.  She was seen in ER due to chest wall pain and abdominal pain.  She reports problems with constipation.  She is here to discuss recent scans done in ER.  She has begun RT.  She reports she has discontinued Femara.    i    Malignant neoplasm of upper-inner quadrant of right breast in female, estrogen receptor positive ([place])   04/13/2017 Initial Diagnosis    Screening detected right breast mass with calcifications 2.1 cm in size and 3.4 cm in size. Calcifications were fibroadenoma. Right breast biopsy upper inner quadrant 1:00 mass: IDC with DCIS with necrosis, grade 3, ER 100%, PR 70%, Ki-67 60%, HER-2 positive ratio 2.34, T2 N1 stage IB (New AJCC)      04/25/2017 Breast MRI  Right breast upper inner quadrant 2.2 x 1.7 x 2.7 cm mass, abnormal area of clumped non-mass enhancement in the right lateral breast 3.2 x 1.9 x 1 cm, too abnormal lymph nodes right axilla 4.3 and 1.7 cm (biopsy-proven breast cancer)       05/08/2017 Procedure    Right axilla lymph node biopsy: Metastatic carcinoma      05/13/2017 Procedure    Right breast biopsy retroareolar region: Intraductal papilloma      01/13/2018 -  Chemotherapy    The patient had ado-trastuzumab emtansine (KADCYLA) 420 mg in sodium chloride 0.9 % 250 mL chemo infusion, 3.6 mg/kg = 420 mg, Intravenous, Once, 2 of 5 cycles Administration: 420 mg (01/20/2018), 420 mg (02/10/2018)  for chemotherapy treatment.         Problem List Patient Active Problem List   Diagnosis Date Noted  . Weight loss [R63.4] 06/13/2017  . Malignant neoplasm of upper-inner quadrant of right breast in female, estrogen receptor positive ([place]) [C50.211, Z17.0] 04/16/2017     Past Medical History Past Medical History:  Diagnosis Date  . Asthma   . Blood transfusion    as child  . Cancer ([place])   . Diabetes ([place])    type 2   . GERD (gastroesophageal reflux disease)   . Headache   . Heart murmur    d/t aortic stenosis   . History of kidney stones   . Hyperlipidemia   . Hypertension   . Normal echocardiogram   . Sleep apnea     Past Surgical History Past Surgical History:  Procedure Laterality Date  . ABDOMINAL HYSTERECTOMY  2004  . BREAST LUMPECTOMY WITH RADIOACTIVE SEED AND SENTINEL LYMPH NODE BIOPSY Right 12/09/2017   Procedure: RIGHT BREAST LUMPECTOMY WITH RADIOACTIVE SEED AND SENTINEL LYMPH NODE BIOPSY AND RADIOACTIVE SEED TARGETED RIGHT AXILLARY LYMPH NODE EXCISION;  Surgeon: Alphonsa Overall, MD;  Location: [name];  Service: General;  Laterality: Right;  . CHOLECYSTECTOMY  05/30/2011   Procedure: LAPAROSCOPIC CHOLECYSTECTOMY;  Surgeon: Donato Heinz;  Location: [facility];  Service: General;  Laterality: N/A;  . 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 KIDNEY STONE SURGERY     2017 2-23  . PORTACATH PLACEMENT N/A 05/25/2017   Procedure: INSERTION PORT-A-CATH;  Surgeon: Alphonsa Overall, MD;  Location: [facility];  Service: General;  Laterality: N/A;    Family History Family History  Problem Relation Age of Onset  . Lung cancer Maternal Grandmother   . Anesthesia problems Neg Hx   . Hypotension Neg Hx   . Malignant hyperthermia Neg Hx   . Pseudochol deficiency Neg Hx      Social History  reports that she has never smoked. She has never used smokeless tobacco. She reports that she drinks alcohol. She reports that she does not use drugs.  Medications  Current Outpatient Medications:  .  acetaminophen (TYLENOL) 500 MG tablet, Take 1,000 mg by mouth daily as needed for moderate pain or headache., Disp: , Rfl:  .  Ado-Trastuzumab Emtansine (KADCYLA IV), Inject into the vein., Disp: , Rfl:  .  albuterol (PROVENTIL HFA;VENTOLIN  HFA) 108 (90 BASE) MCG/ACT inhaler, Inhale 1 puff into the lungs every 6 (six) hours as needed for wheezing or shortness of breath. , Disp: , Rfl:  .  cephALEXin (KEFLEX) 500 MG capsule, Take 1 capsule (500 mg total) by mouth 4 (four) times daily., Disp: 40 capsule, Rfl: 0 .  diphenhydrAMINE (BENADRYL) 25 mg capsule,  Take 25 mg by mouth at bedtime. , Disp: , Rfl:  .  diphenoxylate-atropine (LOMOTIL) 2.5-0.025 MG tablet, TAKE (1) TABLET BY MOUTH (4) TIMES DAILY AS NEEDED FOR DIARRHEA OR LOOSE STOOLS., Disp: 45 tablet, Rfl: 0 .  Docusate Sodium (STOOL SOFTENER LAXATIVE PO), Take 1 capsule by mouth daily as needed (For constipation.)., Disp: , Rfl:  .  escitalopram (LEXAPRO) 20 MG tablet, Take 10 mg by mouth at bedtime. , Disp: , Rfl:  .  fenofibrate 160 MG tablet, Take 160 mg by mouth at bedtime. , Disp: , Rfl:  .  fluticasone (FLONASE) 50 MCG/ACT nasal spray, Place 1 spray into both nostrils daily as needed for allergies or rhinitis., Disp: , Rfl:  .  HYDROcodone-acetaminophen (NORCO/VICODIN) 5-325 MG tablet, Take 1 tablet by mouth every 6 (six) hours as needed for moderate pain., Disp: 20 tablet, Rfl: 0 .  letrozole (FEMARA) 2.5 MG tablet, Take 1 tablet (2.5 mg total) by mouth daily., Disp: 30 tablet, Rfl: 6 .  lidocaine-prilocaine (EMLA) cream, Apply 1 application topically daily as needed (port access). , Disp: , Rfl:  .  loperamide (IMODIUM A-D) 2 MG tablet, Take 4 mg by mouth as needed for diarrhea or loose stools., Disp: , Rfl:  .  losartan (COZAAR) 50 MG tablet, Take 25 mg by mouth at bedtime. , Disp: , Rfl:  .  magic mouthwash w/lidocaine SOLN, Take 5 mLs by mouth 4 (four) times daily as needed for mouth pain., Disp: 360 mL, Rfl: 1 .  meclizine (ANTIVERT) 25 MG tablet, Take 1 tablet (25 mg total) by mouth 3 (three) times daily as needed for dizziness., Disp: 15 tablet, Rfl: 0 .  Multiple Vitamin (MULTIVITAMIN) tablet, Take 1 tablet by mouth at bedtime. , Disp: , Rfl:  .   neomycin-bacitracin-polymyxin (NEOSPORIN) ointment, Apply 1 application topically as needed for wound care., Disp: , Rfl:  .  omeprazole (PRILOSEC) 40 MG capsule, Take 1 capsule (40 mg total) by mouth daily. (Patient taking differently: Take 40 mg by mouth at bedtime. ), Disp: 30 capsule, Rfl: 5 .  ondansetron (ZOFRAN ODT) 4 MG disintegrating tablet, Take 1 tablet (4 mg total) by mouth every 8 (eight) hours as needed for nausea or vomiting., Disp: 6 tablet, Rfl: 0 .  oxyCODONE (ROXICODONE) 5 MG immediate release tablet, Take 1 tablet (5 mg total) by mouth every 6 (six) hours as needed for moderate pain or severe pain., Disp: 12 tablet, Rfl: 0 .  pantoprazole (PROTONIX) 20 MG tablet, Take 1 tablet (20 mg total) by mouth daily., Disp: 30 tablet, Rfl: 0 .  prochlorperazine (COMPAZINE) 10 MG tablet, TAKE (1) TABLET BY MOUTH EVERY SIX HOURS AS NEEDED FOR NAUSEA AND VOMITING., Disp: 30 tablet, Rfl: 0 .  Skin Protectants, Misc. (EUCERIN) cream, Apply 1 application topically as needed for dry skin., Disp: , Rfl:   Allergies Latex; Pyridostigmine bromide; and Statins  Review of Systems Review of Systems - Oncology ROS as per HPI otherwise 12 point ROS is negative.   Physical Exam  Vitals Wt Readings from Last 3 Encounters:  02/25/18 243 lb 11.2 oz (110.5 kg)  02/20/18 252 lb (114.3 kg)  02/10/18 252 lb 9.6 oz (114.6 kg)   Temp Readings from Last 3 Encounters:  02/25/18 98.3 F (36.8 C) (Oral)  02/20/18 98.3 F (36.8 C) (Oral)  02/10/18 98 F (36.7 C) (Oral)   BP Readings from Last 3 Encounters:  02/25/18 (!) 142/72  02/23/18 (!) 158/90  02/20/18 136/86   Pulse Readings from  Last 3 Encounters:  02/25/18 83  02/23/18 79  02/20/18 79    Constitutional: Well-developed, well-nourished, and in no distress.   HENT: Head: Normocephalic and atraumatic.  Mouth/Throat: No oropharyngeal exudate. Mucosa moist. Eyes: Pupils are equal, round, and reactive to light. Conjunctivae are normal. No  scleral icterus.  Neck: Normal range of motion. Neck supple. No JVD present.  Cardiovascular: Normal rate, regular rhythm and normal heart sounds.  Exam reveals no gallop and no friction rub.   No murmur heard. Pulmonary/Chest: Effort normal and breath sounds normal. No respiratory distress. No wheezes.No rales.  Abdominal: Soft. Bowel sounds are normal. Obese.  Tender to palpation on RUQ, no guarding.   Musculoskeletal: No edema or tenderness.  Lymphadenopathy: No cervical, axillary or supraclavicular adenopathy.  Neurological: Alert and oriented to person, place, and time. No cranial nerve deficit.  Skin: Skin is warm and dry. No rash noted. No erythema. No pallor.  Psychiatric: Affect and judgment normal.  Breast exam:  deferred  Labs Admission on 02/23/2018, Discharged on 02/23/2018  Component Date Value Ref Range Status  . Sodium 02/23/2018 137  135 - 145 mmol/L Final  . Potassium 02/23/2018 3.7  3.5 - 5.1 mmol/L Final  . Chloride 02/23/2018 98* 101 - 111 mmol/L Final  . CO2 02/23/2018 26  22 - 32 mmol/L Final  . Glucose, Bld 02/23/2018 149* 65 - 99 mg/dL Final  . BUN 02/23/2018 14  6 - 20 mg/dL Final  . Creatinine, Ser 02/23/2018 0.94  0.44 - 1.00 mg/dL Final  . Calcium 02/23/2018 11.5* 8.9 - 10.3 mg/dL Final  . GFR calc non Af Amer 02/23/2018 >60  >60 mL/min Final  . GFR calc Af Amer 02/23/2018 >60  >60 mL/min Final   Comment: (NOTE) The eGFR has been calculated using the CKD EPI equation. This calculation has not been validated in all clinical situations. eGFR's persistently <60 mL/min signify possible Chronic Kidney Disease.   Georgiann Hahn gap 02/23/2018 13  5 - 15 Final   Performed at [facility], [place] [address] [place], [place] [address]  . WBC 02/23/2018 7.4  4.0 - 10.5 K/uL Final  . RBC 02/23/2018 4.67  3.87 - 5.11 MIL/uL Final  . Hemoglobin 02/23/2018 13.6  12.0 - 15.0 g/dL Final  . HCT 02/23/2018 39.8  36.0 - 46.0 % Final  . MCV 02/23/2018 85.2   78.0 - 100.0 fL Final  . MCH 02/23/2018 29.1  26.0 - 34.0 pg Final  . MCHC 02/23/2018 34.2  30.0 - 36.0 g/dL Final  . RDW 02/23/2018 14.1  11.5 - 15.5 % Final  . Platelets 02/23/2018 132* 150 - 400 K/uL Final   Performed at [facility], [place] [address], [place], [place] [address]  . Troponin i, poc 02/23/2018 0.01  0.00 - 0.08 ng/mL Final  . Comment 3 02/23/2018          Final   Comment: Due to the release kinetics of cTnI, a negative result within the first hours of the onset of symptoms does not rule out myocardial infarction with certainty. If myocardial infarction is still suspected, repeat the test at appropriate intervals.   . I-stat hCG, quantitative 02/23/2018 <5.0  <5 mIU/mL Final  . Comment 3 02/23/2018          Final   Comment:   GEST. AGE      CONC.  (mIU/mL)   <=1 WEEK        5 - 50  2 WEEKS       50 - 500     3 WEEKS       100 - 10,000     4 WEEKS     1,000 - 30,000        FEMALE AND NON-PREGNANT FEMALE:     LESS THAN 5 mIU/mL   . Color, Urine 02/23/2018 YELLOW  YELLOW Final  . APPearance 02/23/2018 CLEAR  CLEAR Final  . Specific Gravity, Urine 02/23/2018 1.025  1.005 - 1.030 Final  . pH 02/23/2018 6.0  5.0 - 8.0 Final  . Glucose, UA 02/23/2018 NEGATIVE  NEGATIVE mg/dL Final  . Hgb urine dipstick 02/23/2018 TRACE* NEGATIVE Final  . Bilirubin Urine 02/23/2018 SMALL* NEGATIVE Final  . Ketones, ur 02/23/2018 NEGATIVE  NEGATIVE mg/dL Final  . Protein, ur 02/23/2018 30* NEGATIVE mg/dL Final  . Nitrite 02/23/2018 NEGATIVE  NEGATIVE Final  . Leukocytes, UA 02/23/2018 SMALL* NEGATIVE Final   Performed at [place] [address], [place], [place] [address]  . RBC / HPF 02/23/2018 0-5  0 - 5 RBC/hpf Final  . WBC, UA 02/23/2018 6-10  0 - 5 WBC/hpf Final  . Bacteria, UA 02/23/2018 RARE* NONE SEEN Final  . Squamous Epithelial / LPF 02/23/2018 0-5  0 - 5 Final   Performed at [facility], [place] [address],  [place], [place] [address]     Pathology No orders of the defined types were placed in this encounter.      Zoila Shutter MD

## 2018-02-25 NOTE — Patient Instructions (Signed)
[facility] Discharge Instructions  Today you saw Dr. Higgs.    Thank you for choosing [facility] at [facility] to provide your oncology and hematology care.  To afford each patient quality time with our provider, please arrive at least 15 minutes before your scheduled appointment time.   If you have a lab appointment with the Cancer Center please come in thru the  Main Entrance and check in at the main information desk  You need to re-schedule your appointment should you arrive 10 or more minutes late.  We strive to give you quality time with our providers, and arriving late affects you and other patients whose appointments are after yours.  Also, if you no show three or more times for appointments you may be dismissed from the clinic at the providers discretion.     Again, thank you for choosing [facility].  Our hope is that these requests will decrease the amount of time that you wait before being seen by our physicians.       _____________________________________________________________  Should you have questions after your visit to [facility], please contact our office at [phone] between the hours of 8:30 a.m. and 4:30 p.m.  Voicemails left after 4:30 p.m. will not be returned until the following business day.  For prescription refill requests, have your pharmacy contact our office.       Resources For Cancer Patients and their Caregivers ? American Cancer Society: Can assist with transportation, wigs, general needs, runs Look Good Feel Better.        1-888-227-6333 ? Cancer Care: Provides financial assistance, online support groups, medication/co-pay assistance.  1-800-813-HOPE (4673) ? [facility] Assists [place] cancer patients and their families through emotional , educational and financial support.  336-427-4357 ? [place] DSS Where to apply for  food stamps, Medicaid and utility assistance. 336-342-1394 ? [facility]: Transportation to medical appointments. 336-347-2287 ? Social Security Administration: May apply for disability if have a Stage IV cancer. 336-342-7796 1-800-772-1213 ? [place] Aging, Disability and Transit Services: Assists with nutrition, care and transit needs. 336-349-2343  Cancer Center Support Programs:   > Cancer Support Group  2nd Tuesday of the month 1pm-2pm, Journey Room   > Creative Journey  3rd Tuesday of the month 1130am-1pm, Journey Room    

## 2018-02-26 ENCOUNTER — Ambulatory Visit
Admission: RE | Admit: 2018-02-26 | Discharge: 2018-02-26 | Disposition: A | Discharge status: Home / Self Care | Payer: 59 | Source: Ambulatory Visit | Attending: Radiation Oncology | Admitting: Radiation Oncology

## 2018-02-26 DIAGNOSIS — Z170 Estrogen receptor positive status [ER+]: Principal | ICD-10-CM

## 2018-02-26 DIAGNOSIS — C50211 Malignant neoplasm of upper-inner quadrant of right female breast: Secondary | ICD-10-CM

## 2018-02-26 MED ORDER — RADIAPLEXRX EX GEL
Freq: Once | CUTANEOUS | Status: AC
  Administered 2018-02-26: 17:00:00 via TOPICAL

## 2018-02-26 NOTE — Progress Notes (Signed)
Pt here for patient teaching.  Pt given Radiation and You booklet, skin care instructions and Radiaplex gel.  Reviewed areas of pertinence such as fatigue, hair loss, skin changes, breast tenderness and breast swelling . Pt able to give teach back of to pat skin and use unscented/gentle soap,apply Radiaplex bid, avoid applying anything to skin within 4 hours of treatment and to use an electric razor if they must shave. Pt verbalizes understanding of information given and will contact nursing with any questions or concerns.     Cori Razor, RN

## 2018-03-01 ENCOUNTER — Ambulatory Visit
Admission: RE | Admit: 2018-03-01 | Discharge: 2018-03-01 | Disposition: A | Discharge status: Home / Self Care | Payer: 59 | Source: Ambulatory Visit | Attending: Radiation Oncology | Admitting: Radiation Oncology

## 2018-03-01 DIAGNOSIS — C50211 Malignant neoplasm of upper-inner quadrant of right female breast: Secondary | ICD-10-CM | POA: Diagnosis not present

## 2018-03-02 ENCOUNTER — Ambulatory Visit
Admission: RE | Admit: 2018-03-02 | Discharge: 2018-03-02 | Disposition: A | Discharge status: Home / Self Care | Payer: 59 | Source: Ambulatory Visit | Attending: Radiation Oncology | Admitting: Radiation Oncology

## 2018-03-02 DIAGNOSIS — C50211 Malignant neoplasm of upper-inner quadrant of right female breast: Secondary | ICD-10-CM | POA: Diagnosis not present

## 2018-03-03 ENCOUNTER — Inpatient Hospital Stay (HOSPITAL_COMMUNITY): Payer: 59

## 2018-03-03 ENCOUNTER — Ambulatory Visit
Admission: RE | Admit: 2018-03-03 | Discharge: 2018-03-03 | Disposition: A | Discharge status: Home / Self Care | Payer: 59 | Source: Ambulatory Visit | Attending: Radiation Oncology | Admitting: Radiation Oncology

## 2018-03-03 ENCOUNTER — Ambulatory Visit (HOSPITAL_COMMUNITY): Payer: 59

## 2018-03-03 ENCOUNTER — Ambulatory Visit (HOSPITAL_COMMUNITY): Payer: 59 | Admitting: Hematology

## 2018-03-03 VITALS — BP 128/74 | HR 69 | Temp 97.8°F | Resp 16 | Wt 247.4 lb

## 2018-03-03 DIAGNOSIS — Z5111 Encounter for antineoplastic chemotherapy: Secondary | ICD-10-CM | POA: Diagnosis not present

## 2018-03-03 DIAGNOSIS — Z170 Estrogen receptor positive status [ER+]: Secondary | ICD-10-CM

## 2018-03-03 DIAGNOSIS — C50211 Malignant neoplasm of upper-inner quadrant of right female breast: Secondary | ICD-10-CM | POA: Diagnosis not present

## 2018-03-03 LAB — COMPREHENSIVE METABOLIC PANEL
ALK PHOS: 79 U/L (ref 38–126)
ALT: 31 U/L (ref 14–54)
AST: 31 U/L (ref 15–41)
Albumin: 4 g/dL (ref 3.5–5.0)
Anion gap: 7 (ref 5–15)
BUN: 21 mg/dL — AB (ref 6–20)
CALCIUM: 10.3 mg/dL (ref 8.9–10.3)
CHLORIDE: 103 mmol/L (ref 101–111)
CO2: 27 mmol/L (ref 22–32)
CREATININE: 0.96 mg/dL (ref 0.44–1.00)
GFR calc Af Amer: >60 mL/min (ref >60)
Glucose, Bld: 167 mg/dL — ABNORMAL HIGH (ref 65–99)
Potassium: 3.8 mmol/L (ref 3.5–5.1)
Sodium: 137 mmol/L (ref 135–145)
Total Bilirubin: 0.7 mg/dL (ref 0.3–1.2)
Total Protein: 7.3 g/dL (ref 6.5–8.1)

## 2018-03-03 LAB — CBC WITH DIFFERENTIAL/PLATELET
BASOS ABS: 0 10*3/uL (ref 0.0–0.1)
Basophils Relative: 0 %
EOS ABS: 0.1 10*3/uL (ref 0.0–0.7)
EOS PCT: 3 %
HCT: 36.2 % (ref 36.0–46.0)
HEMOGLOBIN: 11.7 g/dL — AB (ref 12.0–15.0)
Lymphocytes Relative: 39 %
Lymphs Abs: 1.7 10*3/uL (ref 0.7–4.0)
MCH: 28 pg (ref 26.0–34.0)
MCHC: 32.3 g/dL (ref 30.0–36.0)
MCV: 86.6 fL (ref 78.0–100.0)
Monocytes Absolute: 0.5 10*3/uL (ref 0.1–1.0)
Monocytes Relative: 11 %
NEUTROS PCT: 47 %
Neutro Abs: 2 10*3/uL (ref 1.7–7.7)
Platelets: 144 10*3/uL — ABNORMAL LOW (ref 150–400)
RBC: 4.18 MIL/uL (ref 3.87–5.11)
RDW: 14.5 % (ref 11.5–15.5)
WBC: 4.3 10*3/uL (ref 4.0–10.5)

## 2018-03-03 LAB — LACTATE DEHYDROGENASE: LDH: 108 U/L (ref 98–192)

## 2018-03-03 MED ORDER — SODIUM CHLORIDE 0.9 % IV SOLN
3.6000 mg/kg | Freq: Once | INTRAVENOUS | Status: AC
  Administered 2018-03-03: 420 mg via INTRAVENOUS
  Filled 2018-03-03: qty 5

## 2018-03-03 MED ORDER — SODIUM CHLORIDE 0.9% FLUSH
10.0000 mL | INTRAVENOUS | Status: DC | PRN
  Administered 2018-03-03: 10 mL
  Filled 2018-03-03: qty 10

## 2018-03-03 MED ORDER — DIPHENHYDRAMINE HCL 25 MG PO CAPS
50.0000 mg | ORAL_CAPSULE | Freq: Once | ORAL | Status: AC
  Administered 2018-03-03: 50 mg via ORAL
  Filled 2018-03-03: qty 2

## 2018-03-03 MED ORDER — SODIUM CHLORIDE 0.9 % IV SOLN
Freq: Once | INTRAVENOUS | Status: AC
  Administered 2018-03-03: 13:00:00 via INTRAVENOUS

## 2018-03-03 MED ORDER — HEPARIN SOD (PORK) LOCK FLUSH 100 UNIT/ML IV SOLN
500.0000 [IU] | Freq: Once | INTRAVENOUS | Status: AC | PRN
  Administered 2018-03-03: 500 [IU]
  Filled 2018-03-03 (×2): qty 5

## 2018-03-03 MED ORDER — ACETAMINOPHEN 325 MG PO TABS
650.0000 mg | ORAL_TABLET | Freq: Once | ORAL | Status: AC
  Administered 2018-03-03: 650 mg via ORAL
  Filled 2018-03-03: qty 2

## 2018-03-03 NOTE — Progress Notes (Addendum)
Per Dr. Walden Field -labs only needed every 6 weeks. Pharmacy made aware.   Treatment given per orders. Patient tolerated it well without problems. Vitals stable and discharged home from clinic ambulatory. Follow up as scheduled.

## 2018-03-04 ENCOUNTER — Ambulatory Visit
Admission: RE | Admit: 2018-03-04 | Discharge: 2018-03-04 | Disposition: A | Discharge status: Home / Self Care | Payer: 59 | Source: Ambulatory Visit | Attending: Radiation Oncology | Admitting: Radiation Oncology

## 2018-03-04 DIAGNOSIS — C50211 Malignant neoplasm of upper-inner quadrant of right female breast: Secondary | ICD-10-CM | POA: Diagnosis not present

## 2018-03-05 ENCOUNTER — Ambulatory Visit
Admission: RE | Admit: 2018-03-05 | Discharge: 2018-03-05 | Disposition: A | Discharge status: Home / Self Care | Payer: 59 | Source: Ambulatory Visit | Attending: Radiation Oncology | Admitting: Radiation Oncology

## 2018-03-05 DIAGNOSIS — C50211 Malignant neoplasm of upper-inner quadrant of right female breast: Secondary | ICD-10-CM | POA: Diagnosis not present

## 2018-03-09 ENCOUNTER — Ambulatory Visit
Admission: RE | Admit: 2018-03-09 | Discharge: 2018-03-09 | Disposition: A | Discharge status: Home / Self Care | Payer: 59 | Source: Ambulatory Visit | Attending: Radiation Oncology | Admitting: Radiation Oncology

## 2018-03-09 DIAGNOSIS — C50211 Malignant neoplasm of upper-inner quadrant of right female breast: Secondary | ICD-10-CM | POA: Diagnosis not present

## 2018-03-10 ENCOUNTER — Ambulatory Visit
Admission: RE | Admit: 2018-03-10 | Discharge: 2018-03-10 | Disposition: A | Discharge status: Home / Self Care | Payer: 59 | Source: Ambulatory Visit | Attending: Radiation Oncology | Admitting: Radiation Oncology

## 2018-03-10 DIAGNOSIS — C50211 Malignant neoplasm of upper-inner quadrant of right female breast: Secondary | ICD-10-CM | POA: Diagnosis not present

## 2018-03-11 ENCOUNTER — Ambulatory Visit
Admission: RE | Admit: 2018-03-11 | Discharge: 2018-03-11 | Disposition: A | Discharge status: Home / Self Care | Payer: 59 | Source: Ambulatory Visit | Attending: Radiation Oncology | Admitting: Radiation Oncology

## 2018-03-11 DIAGNOSIS — C50211 Malignant neoplasm of upper-inner quadrant of right female breast: Secondary | ICD-10-CM | POA: Diagnosis not present

## 2018-03-12 ENCOUNTER — Ambulatory Visit
Admission: RE | Admit: 2018-03-12 | Discharge: 2018-03-12 | Disposition: A | Discharge status: Home / Self Care | Payer: 59 | Source: Ambulatory Visit | Attending: Radiation Oncology | Admitting: Radiation Oncology

## 2018-03-12 DIAGNOSIS — C50211 Malignant neoplasm of upper-inner quadrant of right female breast: Secondary | ICD-10-CM

## 2018-03-12 DIAGNOSIS — Z170 Estrogen receptor positive status [ER+]: Principal | ICD-10-CM

## 2018-03-12 MED ORDER — ALRA NON-METALLIC DEODORANT (RAD-ONC)
1.0000 "application " | Freq: Once | TOPICAL | Status: AC
  Administered 2018-03-12: 1 via TOPICAL

## 2018-03-15 ENCOUNTER — Ambulatory Visit
Admission: RE | Admit: 2018-03-15 | Discharge: 2018-03-15 | Disposition: A | Discharge status: Home / Self Care | Payer: 59 | Source: Ambulatory Visit | Attending: Radiation Oncology | Admitting: Radiation Oncology

## 2018-03-15 DIAGNOSIS — C50211 Malignant neoplasm of upper-inner quadrant of right female breast: Secondary | ICD-10-CM | POA: Diagnosis present

## 2018-03-15 DIAGNOSIS — Z510 Encounter for antineoplastic radiation therapy: Secondary | ICD-10-CM | POA: Diagnosis not present

## 2018-03-16 ENCOUNTER — Ambulatory Visit
Admission: RE | Admit: 2018-03-16 | Discharge: 2018-03-16 | Disposition: A | Discharge status: Home / Self Care | Payer: 59 | Source: Ambulatory Visit | Attending: Radiation Oncology | Admitting: Radiation Oncology

## 2018-03-16 DIAGNOSIS — Z510 Encounter for antineoplastic radiation therapy: Secondary | ICD-10-CM | POA: Diagnosis not present

## 2018-03-17 ENCOUNTER — Ambulatory Visit
Admission: RE | Admit: 2018-03-17 | Discharge: 2018-03-17 | Disposition: A | Discharge status: Home / Self Care | Payer: 59 | Source: Ambulatory Visit | Attending: Radiation Oncology | Admitting: Radiation Oncology

## 2018-03-17 DIAGNOSIS — Z510 Encounter for antineoplastic radiation therapy: Secondary | ICD-10-CM | POA: Diagnosis not present

## 2018-03-18 ENCOUNTER — Ambulatory Visit
Admission: RE | Admit: 2018-03-18 | Discharge: 2018-03-18 | Disposition: A | Discharge status: Home / Self Care | Payer: 59 | Source: Ambulatory Visit | Attending: Radiation Oncology | Admitting: Radiation Oncology

## 2018-03-18 DIAGNOSIS — Z510 Encounter for antineoplastic radiation therapy: Secondary | ICD-10-CM | POA: Diagnosis not present

## 2018-03-19 ENCOUNTER — Ambulatory Visit: Payer: 59 | Admitting: Radiation Oncology

## 2018-03-19 ENCOUNTER — Ambulatory Visit
Admission: RE | Admit: 2018-03-19 | Discharge: 2018-03-19 | Disposition: A | Discharge status: Home / Self Care | Payer: 59 | Source: Ambulatory Visit | Attending: Radiation Oncology | Admitting: Radiation Oncology

## 2018-03-19 DIAGNOSIS — Z510 Encounter for antineoplastic radiation therapy: Secondary | ICD-10-CM | POA: Diagnosis not present

## 2018-03-22 ENCOUNTER — Ambulatory Visit
Admission: RE | Admit: 2018-03-22 | Discharge: 2018-03-22 | Disposition: A | Discharge status: Home / Self Care | Payer: 59 | Source: Ambulatory Visit | Attending: Radiation Oncology | Admitting: Radiation Oncology

## 2018-03-22 DIAGNOSIS — Z510 Encounter for antineoplastic radiation therapy: Secondary | ICD-10-CM | POA: Diagnosis not present

## 2018-03-23 ENCOUNTER — Inpatient Hospital Stay (HOSPITAL_COMMUNITY): Payer: 59 | Attending: Hematology

## 2018-03-23 ENCOUNTER — Ambulatory Visit
Admission: RE | Admit: 2018-03-23 | Discharge: 2018-03-23 | Disposition: A | Discharge status: Home / Self Care | Payer: 59 | Source: Ambulatory Visit | Attending: Radiation Oncology | Admitting: Radiation Oncology

## 2018-03-23 ENCOUNTER — Other Ambulatory Visit (HOSPITAL_COMMUNITY)
Admission: RE | Admit: 2018-03-23 | Discharge: 2018-03-23 | Disposition: A | Discharge status: Home / Self Care | Payer: 59 | Source: Ambulatory Visit | Attending: Family Medicine | Admitting: Family Medicine

## 2018-03-23 DIAGNOSIS — Z9221 Personal history of antineoplastic chemotherapy: Secondary | ICD-10-CM | POA: Insufficient documentation

## 2018-03-23 DIAGNOSIS — G4730 Sleep apnea, unspecified: Secondary | ICD-10-CM | POA: Diagnosis not present

## 2018-03-23 DIAGNOSIS — Z801 Family history of malignant neoplasm of trachea, bronchus and lung: Secondary | ICD-10-CM | POA: Insufficient documentation

## 2018-03-23 DIAGNOSIS — Z6841 Body Mass Index (BMI) 40.0 and over, adult: Secondary | ICD-10-CM | POA: Insufficient documentation

## 2018-03-23 DIAGNOSIS — G629 Polyneuropathy, unspecified: Secondary | ICD-10-CM | POA: Insufficient documentation

## 2018-03-23 DIAGNOSIS — Z1389 Encounter for screening for other disorder: Secondary | ICD-10-CM | POA: Diagnosis not present

## 2018-03-23 DIAGNOSIS — I10 Essential (primary) hypertension: Secondary | ICD-10-CM | POA: Insufficient documentation

## 2018-03-23 DIAGNOSIS — J45909 Unspecified asthma, uncomplicated: Secondary | ICD-10-CM | POA: Insufficient documentation

## 2018-03-23 DIAGNOSIS — E785 Hyperlipidemia, unspecified: Secondary | ICD-10-CM | POA: Diagnosis not present

## 2018-03-23 DIAGNOSIS — K219 Gastro-esophageal reflux disease without esophagitis: Secondary | ICD-10-CM | POA: Diagnosis not present

## 2018-03-23 DIAGNOSIS — Z87442 Personal history of urinary calculi: Secondary | ICD-10-CM | POA: Diagnosis not present

## 2018-03-23 DIAGNOSIS — Z170 Estrogen receptor positive status [ER+]: Secondary | ICD-10-CM | POA: Diagnosis not present

## 2018-03-23 DIAGNOSIS — E119 Type 2 diabetes mellitus without complications: Secondary | ICD-10-CM | POA: Insufficient documentation

## 2018-03-23 DIAGNOSIS — Z79899 Other long term (current) drug therapy: Secondary | ICD-10-CM | POA: Insufficient documentation

## 2018-03-23 DIAGNOSIS — Z510 Encounter for antineoplastic radiation therapy: Secondary | ICD-10-CM | POA: Diagnosis not present

## 2018-03-23 DIAGNOSIS — C50211 Malignant neoplasm of upper-inner quadrant of right female breast: Secondary | ICD-10-CM | POA: Insufficient documentation

## 2018-03-23 DIAGNOSIS — Z5112 Encounter for antineoplastic immunotherapy: Secondary | ICD-10-CM | POA: Diagnosis not present

## 2018-03-23 LAB — CBC WITH DIFFERENTIAL/PLATELET
Basophils Absolute: 0 10*3/uL (ref 0.0–0.1)
Basophils Relative: 0 %
Eosinophils Absolute: 0.2 10*3/uL (ref 0.0–0.7)
Eosinophils Relative: 3 %
HEMATOCRIT: 37 % (ref 36.0–46.0)
HEMOGLOBIN: 12.1 g/dL (ref 12.0–15.0)
Lymphocytes Relative: 21 %
Lymphs Abs: 1.4 10*3/uL (ref 0.7–4.0)
MCH: 28.9 pg (ref 26.0–34.0)
MCHC: 32.7 g/dL (ref 30.0–36.0)
MCV: 88.3 fL (ref 78.0–100.0)
MONOS PCT: 8 %
Monocytes Absolute: 0.5 10*3/uL (ref 0.1–1.0)
NEUTROS ABS: 4.6 10*3/uL (ref 1.7–7.7)
NEUTROS PCT: 68 %
Platelets: 171 10*3/uL (ref 150–400)
RBC: 4.19 MIL/uL (ref 3.87–5.11)
RDW: 15.4 % (ref 11.5–15.5)
WBC: 6.8 10*3/uL (ref 4.0–10.5)

## 2018-03-23 LAB — COMPREHENSIVE METABOLIC PANEL
ALK PHOS: 87 U/L (ref 38–126)
ALT: 32 U/L (ref 14–54)
ANION GAP: 9 (ref 5–15)
AST: 33 U/L (ref 15–41)
Albumin: 4.1 g/dL (ref 3.5–5.0)
BILIRUBIN TOTAL: 0.5 mg/dL (ref 0.3–1.2)
BUN: 16 mg/dL (ref 6–20)
CALCIUM: 10.9 mg/dL — AB (ref 8.9–10.3)
CO2: 27 mmol/L (ref 22–32)
CREATININE: 0.91 mg/dL (ref 0.44–1.00)
Chloride: 103 mmol/L (ref 101–111)
GFR calc Af Amer: >60 mL/min (ref >60)
GFR calc non Af Amer: >60 mL/min (ref >60)
GLUCOSE: 135 mg/dL — AB (ref 65–99)
Potassium: 4 mmol/L (ref 3.5–5.1)
Sodium: 139 mmol/L (ref 135–145)
TOTAL PROTEIN: 7.9 g/dL (ref 6.5–8.1)

## 2018-03-23 LAB — TSH: TSH: 2.382 u[IU]/mL (ref 0.350–4.500)

## 2018-03-24 ENCOUNTER — Encounter (HOSPITAL_COMMUNITY): Payer: Self-pay

## 2018-03-24 ENCOUNTER — Inpatient Hospital Stay (HOSPITAL_COMMUNITY): Payer: 59

## 2018-03-24 ENCOUNTER — Inpatient Hospital Stay (HOSPITAL_BASED_OUTPATIENT_CLINIC_OR_DEPARTMENT_OTHER): Payer: 59 | Admitting: Hematology

## 2018-03-24 ENCOUNTER — Ambulatory Visit
Admission: RE | Admit: 2018-03-24 | Discharge: 2018-03-24 | Disposition: A | Discharge status: Home / Self Care | Payer: 59 | Source: Ambulatory Visit | Attending: Radiation Oncology | Admitting: Radiation Oncology

## 2018-03-24 ENCOUNTER — Encounter (HOSPITAL_COMMUNITY): Payer: Self-pay | Admitting: Hematology

## 2018-03-24 VITALS — BP 111/58 | HR 73 | Temp 98.2°F | Resp 18 | Wt 244.5 lb

## 2018-03-24 DIAGNOSIS — Z5112 Encounter for antineoplastic immunotherapy: Secondary | ICD-10-CM | POA: Diagnosis not present

## 2018-03-24 DIAGNOSIS — E785 Hyperlipidemia, unspecified: Secondary | ICD-10-CM

## 2018-03-24 DIAGNOSIS — G629 Polyneuropathy, unspecified: Secondary | ICD-10-CM | POA: Diagnosis not present

## 2018-03-24 DIAGNOSIS — Z170 Estrogen receptor positive status [ER+]: Secondary | ICD-10-CM

## 2018-03-24 DIAGNOSIS — E119 Type 2 diabetes mellitus without complications: Secondary | ICD-10-CM

## 2018-03-24 DIAGNOSIS — C50211 Malignant neoplasm of upper-inner quadrant of right female breast: Secondary | ICD-10-CM | POA: Diagnosis not present

## 2018-03-24 DIAGNOSIS — I10 Essential (primary) hypertension: Secondary | ICD-10-CM

## 2018-03-24 DIAGNOSIS — J45909 Unspecified asthma, uncomplicated: Secondary | ICD-10-CM

## 2018-03-24 DIAGNOSIS — Z510 Encounter for antineoplastic radiation therapy: Secondary | ICD-10-CM | POA: Diagnosis not present

## 2018-03-24 DIAGNOSIS — Z87442 Personal history of urinary calculi: Secondary | ICD-10-CM

## 2018-03-24 DIAGNOSIS — Z79899 Other long term (current) drug therapy: Secondary | ICD-10-CM

## 2018-03-24 DIAGNOSIS — Z801 Family history of malignant neoplasm of trachea, bronchus and lung: Secondary | ICD-10-CM

## 2018-03-24 DIAGNOSIS — Z9221 Personal history of antineoplastic chemotherapy: Secondary | ICD-10-CM

## 2018-03-24 DIAGNOSIS — G4730 Sleep apnea, unspecified: Secondary | ICD-10-CM | POA: Diagnosis not present

## 2018-03-24 DIAGNOSIS — K219 Gastro-esophageal reflux disease without esophagitis: Secondary | ICD-10-CM

## 2018-03-24 LAB — VITAMIN D 25 HYDROXY (VIT D DEFICIENCY, FRACTURES): Vit D, 25-Hydroxy: 28.6 ng/mL — ABNORMAL LOW (ref 30.0–100.0)

## 2018-03-24 MED ORDER — SODIUM CHLORIDE 0.9 % IV SOLN
Freq: Once | INTRAVENOUS | Status: AC
  Administered 2018-03-24: 14:00:00 via INTRAVENOUS

## 2018-03-24 MED ORDER — ACETAMINOPHEN 325 MG PO TABS
650.0000 mg | ORAL_TABLET | Freq: Once | ORAL | Status: AC
  Administered 2018-03-24: 650 mg via ORAL

## 2018-03-24 MED ORDER — SODIUM CHLORIDE 0.9 % IV SOLN
3.6000 mg/kg | Freq: Once | INTRAVENOUS | Status: AC
  Administered 2018-03-24: 420 mg via INTRAVENOUS
  Filled 2018-03-24: qty 5

## 2018-03-24 MED ORDER — SODIUM CHLORIDE 0.9% FLUSH
10.0000 mL | INTRAVENOUS | Status: DC | PRN
  Administered 2018-03-24: 10 mL
  Filled 2018-03-24: qty 10

## 2018-03-24 MED ORDER — DIPHENHYDRAMINE HCL 25 MG PO CAPS
50.0000 mg | ORAL_CAPSULE | Freq: Once | ORAL | Status: AC
  Administered 2018-03-24: 50 mg via ORAL

## 2018-03-24 MED ORDER — ACETAMINOPHEN 325 MG PO TABS
ORAL_TABLET | ORAL | Status: AC
  Filled 2018-03-24: qty 2

## 2018-03-24 MED ORDER — HEPARIN SOD (PORK) LOCK FLUSH 100 UNIT/ML IV SOLN
500.0000 [IU] | Freq: Once | INTRAVENOUS | Status: AC | PRN
  Administered 2018-03-24: 500 [IU]

## 2018-03-24 MED ORDER — DIPHENHYDRAMINE HCL 25 MG PO CAPS
ORAL_CAPSULE | ORAL | Status: AC
  Filled 2018-03-24: qty 2

## 2018-03-24 NOTE — Progress Notes (Signed)
 [facility] [address] [place], [place] [address]   CLINIC:  Medical Oncology/Hematology  PCP:  Golding, John, MD [address] [place] [address] [phone]   REASON FOR VISIT:  Follow-up for right breast cancer.  CURRENT THERAPY: Kadcyla every 3 weeks.  BRIEF ONCOLOGIC HISTORY:    Malignant neoplasm of upper-inner quadrant of right breast in female, estrogen receptor positive (HCC)   04/13/2017 Initial Diagnosis    Screening detected right breast mass with calcifications 2.1 cm in size and 3.4 cm in size. Calcifications were fibroadenoma. Right breast biopsy upper inner quadrant 1:00 mass: IDC with DCIS with necrosis, grade 3, ER 100%, PR 70%, Ki-67 60%, HER-2 positive ratio 2.34, T2 N1 stage IB (New AJCC)      04/25/2017 Breast MRI    Right breast upper inner quadrant 2.2 x 1.7 x 2.7 cm mass, abnormal area of clumped non-mass enhancement in the right lateral breast 3.2 x 1.9 x 1 cm, too abnormal lymph nodes right axilla 4.3 and 1.7 cm (biopsy-proven breast cancer)       05/08/2017 Procedure    Right axilla lymph node biopsy: Metastatic carcinoma      05/13/2017 Procedure    Right breast biopsy retroareolar region: Intraductal papilloma      01/13/2018 -  Chemotherapy    The patient had ado-trastuzumab emtansine (KADCYLA) 420 mg in sodium chloride 0.9 % 250 mL chemo infusion, 3.6 mg/kg = 420 mg, Intravenous, Once, 4 of 9 cycles Administration: 420 mg (01/20/2018), 420 mg (02/10/2018), 420 mg (03/03/2018)  for chemotherapy treatment.         CANCER STAGING: Cancer Staging Malignant neoplasm of upper-inner quadrant of right breast in female, estrogen receptor positive (HCC) Staging form: Breast, AJCC 8th Edition - Clinical stage from 04/22/2017: Stage IB (cT2, cN0, cM0, G3, ER: Positive, PR: Positive, HER2: Positive) - Unsigned    INTERVAL HISTORY:  Ms. Thackeray 57 y.o. female returns for follow-up of her right breast cancer.  She is tolerating  Kadcyla every 3 weeks very well.  Denies any GI symptoms including nausea, vomiting or diarrhea.  Denies any fevers or infections.  She stopped taking Femara as it was causing chest pains and right-sided abdominal pain.  She had to go to the emergency room on 02/23/2018 and they did do CT scan of the abdomen and pelvis.  She is tolerating gabapentin for her neuropathy very well.  She was reportedly started back on metformin.  Denies any symptoms of PND or orthopnea.    REVIEW OF SYSTEMS:  Review of Systems  Neurological: Positive for numbness.  All other systems reviewed and are negative.    PAST MEDICAL/SURGICAL HISTORY:  Past Medical History:  Diagnosis Date  . Asthma   . Blood transfusion    as child  . Cancer (HCC)   . Diabetes (HCC)    type 2   . GERD (gastroesophageal reflux disease)   . Headache   . Heart murmur    d/t aortic stenosis   . History of kidney stones   . Hyperlipidemia   . Hypertension   . Normal echocardiogram   . Sleep apnea    Past Surgical History:  Procedure Laterality Date  . ABDOMINAL HYSTERECTOMY  2004  . BREAST LUMPECTOMY WITH RADIOACTIVE SEED AND SENTINEL LYMPH NODE BIOPSY Right 12/09/2017   Procedure: RIGHT BREAST LUMPECTOMY WITH RADIOACTIVE SEED AND SENTINEL LYMPH NODE BIOPSY AND RADIOACTIVE SEED TARGETED RIGHT AXILLARY LYMPH NODE EXCISION;  Surgeon: Newman, David, MD;  Location:   [facility] OR;  Service: General;  Laterality: Right;  . CHOLECYSTECTOMY  05/30/2011   Procedure: LAPAROSCOPIC CHOLECYSTECTOMY;  Surgeon: Brent C Ziegler;  Location: [facility]S;  Service: General;  Laterality: N/A;  . 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 KIDNEY STONE SURGERY     2017 2-23  . PORTACATH PLACEMENT N/A 05/25/2017   Procedure: INSERTION PORT-A-CATH;  Surgeon: Newman, David, MD;  Location: [facility] ORS;  Service: General;  Laterality: N/A;     SOCIAL HISTORY:  Social History   Socioeconomic History  . Marital status: Married    Spouse name: Not  on file  . Number of children: Not on file  . Years of education: Not on file  . Highest education level: Not on file  Occupational History  . Not on file  Social Needs  . Financial resource strain: Not on file  . Food insecurity:    Worry: Not on file    Inability: Not on file  . Transportation needs:    Medical: Not on file    Non-medical: Not on file  Tobacco Use  . Smoking status: Never Smoker  . Smokeless tobacco: Never Used  Substance and Sexual Activity  . Alcohol use: Yes    Comment: 2 x year  . Drug use: No  . Sexual activity: Yes    Birth control/protection: Surgical  Lifestyle  . Physical activity:    Days per week: Not on file    Minutes per session: Not on file  . Stress: Not on file  Relationships  . Social connections:    Talks on phone: Not on file    Gets together: Not on file    Attends religious service: Not on file    Active member of club or organization: Not on file    Attends meetings of clubs or organizations: Not on file    Relationship status: Not on file  . Intimate partner violence:    Fear of current or ex partner: Not on file    Emotionally abused: Not on file    Physically abused: Not on file    Forced sexual activity: Not on file  Other Topics Concern  . Not on file  Social History Narrative  . Not on file    FAMILY HISTORY:  Family History  Problem Relation Age of Onset  . Lung cancer Maternal Grandmother   . Anesthesia problems Neg Hx   . Hypotension Neg Hx   . Malignant hyperthermia Neg Hx   . Pseudochol deficiency Neg Hx     CURRENT MEDICATIONS:  Outpatient Encounter Medications as of 03/24/2018  Medication Sig Note  . acetaminophen (TYLENOL) 500 MG tablet Take 1,000 mg by mouth daily as needed for moderate pain or headache.   . Ado-Trastuzumab Emtansine (KADCYLA IV) Inject into the vein. 02/23/2018: Receives at the [facility].  . albuterol (PROVENTIL HFA;VENTOLIN HFA) 108 (90 BASE) MCG/ACT inhaler Inhale 1  puff into the lungs every 6 (six) hours as needed for wheezing or shortness of breath.    . cephALEXin (KEFLEX) 500 MG capsule Take 1 capsule (500 mg total) by mouth 4 (four) times daily. 02/23/2018: Take for 10 days starting on 02/20/18.  . diphenhydrAMINE (BENADRYL) 25 mg capsule Take 25 mg by mouth at bedtime.    . diphenoxylate-atropine (LOMOTIL) 2.5-0.025 MG tablet TAKE (1) TABLET BY MOUTH (4) TIMES DAILY AS NEEDED FOR DIARRHEA OR LOOSE STOOLS.   . Docusate Sodium (STOOL   SOFTENER LAXATIVE PO) Take 1 capsule by mouth daily as needed (For constipation.).   . escitalopram (LEXAPRO) 20 MG tablet Take 10 mg by mouth at bedtime.    . fenofibrate 160 MG tablet Take 160 mg by mouth at bedtime.    . fluticasone (FLONASE) 50 MCG/ACT nasal spray Place 1 spray into both nostrils daily as needed for allergies or rhinitis.   . HYDROcodone-acetaminophen (NORCO/VICODIN) 5-325 MG tablet Take 1 tablet by mouth every 6 (six) hours as needed for moderate pain.   . lidocaine-prilocaine (EMLA) cream Apply 1 application topically daily as needed (port access).    . loperamide (IMODIUM A-D) 2 MG tablet Take 4 mg by mouth as needed for diarrhea or loose stools.   . losartan (COZAAR) 50 MG tablet Take 25 mg by mouth at bedtime.    . magic mouthwash w/lidocaine SOLN Take 5 mLs by mouth 4 (four) times daily as needed for mouth pain.   . meclizine (ANTIVERT) 25 MG tablet Take 1 tablet (25 mg total) by mouth 3 (three) times daily as needed for dizziness.   . Multiple Vitamin (MULTIVITAMIN) tablet Take 1 tablet by mouth at bedtime.    . neomycin-bacitracin-polymyxin (NEOSPORIN) ointment Apply 1 application topically as needed for wound care.   . omeprazole (PRILOSEC) 40 MG capsule Take 1 capsule (40 mg total) by mouth daily. (Patient taking differently: Take 40 mg by mouth at bedtime. )   . ondansetron (ZOFRAN ODT) 4 MG disintegrating tablet Take 1 tablet (4 mg total) by mouth every 8 (eight) hours as needed for nausea or  vomiting.   . oxyCODONE (ROXICODONE) 5 MG immediate release tablet Take 1 tablet (5 mg total) by mouth every 6 (six) hours as needed for moderate pain or severe pain.   . pantoprazole (PROTONIX) 20 MG tablet Take 1 tablet (20 mg total) by mouth daily.   . prochlorperazine (COMPAZINE) 10 MG tablet TAKE (1) TABLET BY MOUTH EVERY SIX HOURS AS NEEDED FOR NAUSEA AND VOMITING.   . Skin Protectants, Misc. (EUCERIN) cream Apply 1 application topically as needed for dry skin.    No facility-administered encounter medications on file as of 03/24/2018.     ALLERGIES:  Allergies  Allergen Reactions  . Latex Itching  . Pyridostigmine Bromide Other (See Comments)    Makes muscles twitch  . Statins Nausea Only     PHYSICAL EXAM:  ECOG Performance status: 1  I have reviewed her vital signs, BP 121/69, pulse 80, respiratory rate of 18, temperature 98 Physical Exam Deferred.  LABORATORY DATA:  I have reviewed the labs as listed.  CBC    Component Value Date/Time   WBC 6.8 03/23/2018 1300   RBC 4.19 03/23/2018 1300   HGB 12.1 03/23/2018 1300   HGB 13.8 04/22/2017 0831   HCT 37.0 03/23/2018 1300   HCT 41.9 04/22/2017 0831   PLT 171 03/23/2018 1300   PLT 184 04/22/2017 0831   MCV 88.3 03/23/2018 1300   MCV 85.7 04/22/2017 0831   MCH 28.9 03/23/2018 1300   MCHC 32.7 03/23/2018 1300   RDW 15.4 03/23/2018 1300   RDW 13.9 04/22/2017 0831   LYMPHSABS 1.4 03/23/2018 1300   LYMPHSABS 2.1 04/22/2017 0831   MONOABS 0.5 03/23/2018 1300   MONOABS 0.5 04/22/2017 0831   EOSABS 0.2 03/23/2018 1300   EOSABS 0.3 04/22/2017 0831   BASOSABS 0.0 03/23/2018 1300   BASOSABS 0.0 04/22/2017 0831   CMP Latest Ref Rng & Units 03/23/2018 03/03/2018 02/23/2018  Glucose 65 -   99 mg/dL 135(H) 167(H) 149(H)  BUN 6 - 20 mg/dL 16 21(H) 14  Creatinine 0.44 - 1.00 mg/dL 0.91 0.96 0.94  Sodium 135 - 145 mmol/L 139 137 137  Potassium 3.5 - 5.1 mmol/L 4.0 3.8 3.7  Chloride 101 - 111 mmol/L 103 103 98(L)  CO2 22 - 32  mmol/L 27 27 26  Calcium 8.9 - 10.3 mg/dL 10.9(H) 10.3 11.5(H)  Total Protein 6.5 - 8.1 g/dL 7.9 7.3 -  Total Bilirubin 0.3 - 1.2 mg/dL 0.5 0.7 -  Alkaline Phos 38 - 126 U/L 87 79 -  AST 15 - 41 U/L 33 31 -  ALT 14 - 54 U/L 32 31 -       DIAGNOSTIC IMAGING:  I have reviewed CT scan dated 02/23/2018 and agree with report.     ASSESSMENT & PLAN:   Malignant neoplasm of upper-inner quadrant of right breast in female, estrogen receptor positive (HCC) 1.  Clinical T2N0 HER-2 positive right breast cancer: -Biopsy of the right breast upper inner quadrant 1:00 mass, IDC, ER/PR positive, HER-2 positive, Ki-67 of 60% -Neoadjuvant chemotherapy with 6 cycles of TCHP from 05/29/2017 through 10/09/2017 -Right lumpectomy and lymph node biopsy on 12/09/2017, with lumpectomy #1 specimen with no malignancy, lumpectomy #2 specimen showing 2 cm IDC, grade 3, 1 out of 6 lymph nodes positive for malignancy.  (yPT1cyPN1a) - Based on KATHRINE trial, started on Kadcyla 3.6 mg/kg for 14 cycles started on 01/20/2018, tolerating it very well. -Last echocardiogram on 02/01/2018 shows EF of 60 to 65%, next echo scheduled in mid July. - Patient could not tolerate letrozole resulting in chest pain and right lateral abdominal pain, went to the ER, CT abdomen and pelvis on 02/23/2018 shows stable anterior abdominal wall hernia, discontinued letrozole, currently undergoing radiation therapy, 12 more treatments left.  I would consider alternative agent like anastrozole or exemestane or tamoxifen at next visit. -I will also schedule her for mammogram.  2.  Peripheral neuropathy: -She started having burning on top of her feet, worse on movement for the last 3 weeks.  She was started on gabapentin 600 mg 3 times a day by her PMD.  They are titrating it up as needed.  She also has some numbness in the bottom of the feet.  No numbness in the hands reported.      Orders placed this encounter:  Orders Placed This Encounter    Procedures  . MM DIAG BREAST TOMO BILATERAL  . CBC with Differential  . Comprehensive metabolic panel      Sreedhar Katragadda, MD [facility] [phone]  

## 2018-03-24 NOTE — Progress Notes (Signed)
To treatment area for follow up and chemotherapy.  Patient stated neuropathy bilateral feet and has started gabapentin 2 tabs TID and metformin by her PCP.  No other complaints voiced.  Still on XRT with no complaints of skin problems.   Patient tolerated chemotherapy with no complaints voiced.  Port site clean and dry with good blood return noted before and after administration of chemo.  No bruising or swelling noted at site.  Band aid applied.  VSS with discharge and left ambulatory with no s/s of distress noted.

## 2018-03-24 NOTE — Patient Instructions (Signed)
[place] Discharge Instructions for Patients Receiving Chemotherapy  Today you received the following chemotherapy agents kadcyla today.    If you develop nausea and vomiting that is not controlled by your nausea medication, call the clinic.   BELOW ARE SYMPTOMS THAT SHOULD BE REPORTED IMMEDIATELY:  *FEVER GREATER THAN 100.5 F  *CHILLS WITH OR WITHOUT FEVER  NAUSEA AND VOMITING THAT IS NOT CONTROLLED WITH YOUR NAUSEA MEDICATION  *UNUSUAL SHORTNESS OF BREATH  *UNUSUAL BRUISING OR BLEEDING  TENDERNESS IN MOUTH AND THROAT WITH OR WITHOUT PRESENCE OF ULCERS  *URINARY PROBLEMS  *BOWEL PROBLEMS  UNUSUAL RASH Items with * indicate a potential emergency and should be followed up as soon as possible.  Feel free to call the clinic should you have any questions or concerns. The clinic phone number is [phone].  Please show the [place] at check-in to the Emergency Department and triage nurse.

## 2018-03-24 NOTE — Assessment & Plan Note (Signed)
1.  Clinical T2N0 HER-2 positive right breast cancer: -Biopsy of the right breast upper inner quadrant 1:00 mass, IDC, ER/PR positive, HER-2 positive, Ki-67 of 60% -Neoadjuvant chemotherapy with 6 cycles of TCHP from 05/29/2017 through 10/09/2017 -Right lumpectomy and lymph node biopsy on 12/09/2017, with lumpectomy #1 specimen with no malignancy, lumpectomy #2 specimen showing 2 cm IDC, grade 3, 1 out of 6 lymph nodes positive for malignancy.  (yPT1cyPN1a) - Based on KATHRINE trial, started on Kadcyla 3.6 mg/kg for 14 cycles started on 01/20/2018, tolerating it very well. -Last echocardiogram on 02/01/2018 shows EF of 60 to 65%, next echo scheduled in mid July. - Patient could not tolerate letrozole resulting in chest pain and right lateral abdominal pain, went to the ER, CT abdomen and pelvis on 02/23/2018 shows stable anterior abdominal wall hernia, discontinued letrozole, currently undergoing radiation therapy, 12 more treatments left.  I would consider alternative agent like anastrozole or exemestane or tamoxifen at next visit. -I will also schedule her for mammogram.  2.  Peripheral neuropathy: -She started having burning on top of her feet, worse on movement for the last 3 weeks.  She was started on gabapentin 600 mg 3 times a day by her PMD.  They are titrating it up as needed.  She also has some numbness in the bottom of the feet.  No numbness in the hands reported.

## 2018-03-25 ENCOUNTER — Ambulatory Visit
Admission: RE | Admit: 2018-03-25 | Discharge: 2018-03-25 | Disposition: A | Discharge status: Home / Self Care | Payer: 59 | Source: Ambulatory Visit | Attending: Radiation Oncology | Admitting: Radiation Oncology

## 2018-03-25 DIAGNOSIS — Z510 Encounter for antineoplastic radiation therapy: Secondary | ICD-10-CM | POA: Diagnosis not present

## 2018-03-26 ENCOUNTER — Ambulatory Visit
Admission: RE | Admit: 2018-03-26 | Discharge: 2018-03-26 | Disposition: A | Discharge status: Home / Self Care | Payer: 59 | Source: Ambulatory Visit | Attending: Radiation Oncology | Admitting: Radiation Oncology

## 2018-03-26 DIAGNOSIS — Z510 Encounter for antineoplastic radiation therapy: Secondary | ICD-10-CM | POA: Diagnosis not present

## 2018-03-29 ENCOUNTER — Ambulatory Visit
Admission: RE | Admit: 2018-03-29 | Discharge: 2018-03-29 | Disposition: A | Discharge status: Home / Self Care | Payer: 59 | Source: Ambulatory Visit | Attending: Radiation Oncology | Admitting: Radiation Oncology

## 2018-03-29 DIAGNOSIS — Z510 Encounter for antineoplastic radiation therapy: Secondary | ICD-10-CM | POA: Diagnosis not present

## 2018-03-30 ENCOUNTER — Ambulatory Visit
Admission: RE | Admit: 2018-03-30 | Discharge: 2018-03-30 | Disposition: A | Discharge status: Home / Self Care | Payer: 59 | Source: Ambulatory Visit | Attending: Radiation Oncology | Admitting: Radiation Oncology

## 2018-03-30 DIAGNOSIS — Z510 Encounter for antineoplastic radiation therapy: Secondary | ICD-10-CM | POA: Diagnosis not present

## 2018-03-31 ENCOUNTER — Ambulatory Visit
Admission: RE | Admit: 2018-03-31 | Discharge: 2018-03-31 | Disposition: A | Discharge status: Home / Self Care | Payer: 59 | Source: Ambulatory Visit | Attending: Radiation Oncology | Admitting: Radiation Oncology

## 2018-03-31 DIAGNOSIS — Z510 Encounter for antineoplastic radiation therapy: Secondary | ICD-10-CM | POA: Diagnosis not present

## 2018-04-01 ENCOUNTER — Ambulatory Visit
Admission: RE | Admit: 2018-04-01 | Discharge: 2018-04-01 | Disposition: A | Discharge status: Home / Self Care | Payer: 59 | Source: Ambulatory Visit | Attending: Radiation Oncology | Admitting: Radiation Oncology

## 2018-04-01 DIAGNOSIS — Z510 Encounter for antineoplastic radiation therapy: Secondary | ICD-10-CM | POA: Diagnosis not present

## 2018-04-02 ENCOUNTER — Ambulatory Visit
Admission: RE | Admit: 2018-04-02 | Discharge: 2018-04-02 | Disposition: A | Discharge status: Home / Self Care | Payer: 59 | Source: Ambulatory Visit | Attending: Radiation Oncology | Admitting: Radiation Oncology

## 2018-04-02 DIAGNOSIS — Z170 Estrogen receptor positive status [ER+]: Principal | ICD-10-CM

## 2018-04-02 DIAGNOSIS — C50211 Malignant neoplasm of upper-inner quadrant of right female breast: Secondary | ICD-10-CM

## 2018-04-02 DIAGNOSIS — Z510 Encounter for antineoplastic radiation therapy: Secondary | ICD-10-CM | POA: Diagnosis not present

## 2018-04-02 MED ORDER — RADIAPLEXRX EX GEL
Freq: Once | CUTANEOUS | Status: AC
  Administered 2018-04-02: 12:00:00 via TOPICAL

## 2018-04-05 ENCOUNTER — Ambulatory Visit
Admission: RE | Admit: 2018-04-05 | Discharge: 2018-04-05 | Disposition: A | Discharge status: Home / Self Care | Payer: 59 | Source: Ambulatory Visit | Attending: Radiation Oncology | Admitting: Radiation Oncology

## 2018-04-05 ENCOUNTER — Ambulatory Visit: Payer: 59

## 2018-04-05 DIAGNOSIS — Z510 Encounter for antineoplastic radiation therapy: Secondary | ICD-10-CM | POA: Diagnosis not present

## 2018-04-06 ENCOUNTER — Ambulatory Visit
Admission: RE | Admit: 2018-04-06 | Discharge: 2018-04-06 | Disposition: A | Discharge status: Home / Self Care | Payer: 59 | Source: Ambulatory Visit | Attending: Radiation Oncology | Admitting: Radiation Oncology

## 2018-04-06 DIAGNOSIS — Z510 Encounter for antineoplastic radiation therapy: Secondary | ICD-10-CM | POA: Diagnosis not present

## 2018-04-07 ENCOUNTER — Ambulatory Visit
Admission: RE | Admit: 2018-04-07 | Discharge: 2018-04-07 | Disposition: A | Discharge status: Home / Self Care | Payer: 59 | Source: Ambulatory Visit | Attending: Radiation Oncology | Admitting: Radiation Oncology

## 2018-04-07 DIAGNOSIS — Z510 Encounter for antineoplastic radiation therapy: Secondary | ICD-10-CM | POA: Diagnosis not present

## 2018-04-08 ENCOUNTER — Ambulatory Visit
Admission: RE | Admit: 2018-04-08 | Discharge: 2018-04-08 | Disposition: A | Discharge status: Home / Self Care | Payer: 59 | Source: Ambulatory Visit | Attending: Radiation Oncology | Admitting: Radiation Oncology

## 2018-04-08 ENCOUNTER — Encounter: Payer: Self-pay | Admitting: Radiation Oncology

## 2018-04-08 DIAGNOSIS — Z510 Encounter for antineoplastic radiation therapy: Secondary | ICD-10-CM | POA: Diagnosis not present

## 2018-04-14 ENCOUNTER — Inpatient Hospital Stay (HOSPITAL_COMMUNITY): Payer: 59 | Attending: Internal Medicine

## 2018-04-14 ENCOUNTER — Other Ambulatory Visit: Payer: Self-pay

## 2018-04-14 ENCOUNTER — Encounter (HOSPITAL_COMMUNITY): Payer: Self-pay

## 2018-04-14 VITALS — BP 115/58 | HR 74 | Temp 97.6°F | Resp 18 | Wt 241.8 lb

## 2018-04-14 DIAGNOSIS — C50211 Malignant neoplasm of upper-inner quadrant of right female breast: Secondary | ICD-10-CM | POA: Diagnosis not present

## 2018-04-14 DIAGNOSIS — E785 Hyperlipidemia, unspecified: Secondary | ICD-10-CM | POA: Diagnosis not present

## 2018-04-14 DIAGNOSIS — Z801 Family history of malignant neoplasm of trachea, bronchus and lung: Secondary | ICD-10-CM | POA: Diagnosis not present

## 2018-04-14 DIAGNOSIS — G629 Polyneuropathy, unspecified: Secondary | ICD-10-CM | POA: Diagnosis not present

## 2018-04-14 DIAGNOSIS — Z170 Estrogen receptor positive status [ER+]: Secondary | ICD-10-CM

## 2018-04-14 DIAGNOSIS — Z5112 Encounter for antineoplastic immunotherapy: Secondary | ICD-10-CM | POA: Insufficient documentation

## 2018-04-14 DIAGNOSIS — Z9221 Personal history of antineoplastic chemotherapy: Secondary | ICD-10-CM | POA: Insufficient documentation

## 2018-04-14 DIAGNOSIS — K219 Gastro-esophageal reflux disease without esophagitis: Secondary | ICD-10-CM | POA: Diagnosis not present

## 2018-04-14 DIAGNOSIS — E119 Type 2 diabetes mellitus without complications: Secondary | ICD-10-CM | POA: Insufficient documentation

## 2018-04-14 DIAGNOSIS — I10 Essential (primary) hypertension: Secondary | ICD-10-CM | POA: Insufficient documentation

## 2018-04-14 DIAGNOSIS — Z87442 Personal history of urinary calculi: Secondary | ICD-10-CM | POA: Diagnosis not present

## 2018-04-14 DIAGNOSIS — Z79811 Long term (current) use of aromatase inhibitors: Secondary | ICD-10-CM | POA: Diagnosis not present

## 2018-04-14 DIAGNOSIS — G4730 Sleep apnea, unspecified: Secondary | ICD-10-CM | POA: Diagnosis not present

## 2018-04-14 DIAGNOSIS — Z7984 Long term (current) use of oral hypoglycemic drugs: Secondary | ICD-10-CM | POA: Insufficient documentation

## 2018-04-14 MED ORDER — DIPHENHYDRAMINE HCL 25 MG PO CAPS: 50.0000 mg | ORAL_CAPSULE | Freq: Once | ORAL | Status: DC

## 2018-04-14 MED ORDER — SODIUM CHLORIDE 0.9 % IV SOLN
3.6000 mg/kg | Freq: Once | INTRAVENOUS | Status: AC
  Administered 2018-04-14: 420 mg via INTRAVENOUS
  Filled 2018-04-14: qty 5

## 2018-04-14 MED ORDER — SODIUM CHLORIDE 0.9 % IV SOLN
Freq: Once | INTRAVENOUS | Status: AC
  Administered 2018-04-14: 12:00:00 via INTRAVENOUS

## 2018-04-14 MED ORDER — HEPARIN SOD (PORK) LOCK FLUSH 100 UNIT/ML IV SOLN
500.0000 [IU] | Freq: Once | INTRAVENOUS | Status: AC | PRN
  Administered 2018-04-14: 500 [IU]

## 2018-04-14 MED ORDER — ACETAMINOPHEN 325 MG PO TABS: 650.0000 mg | ORAL_TABLET | Freq: Once | ORAL | Status: DC

## 2018-04-14 NOTE — Progress Notes (Signed)
Tolerated infusion w/o adverse reaction.  Alert, in no distress.  VSS.  Discharged ambulatory.  

## 2018-04-16 ENCOUNTER — Other Ambulatory Visit (HOSPITAL_COMMUNITY): Payer: Self-pay | Admitting: Hematology

## 2018-04-16 DIAGNOSIS — N6310 Unspecified lump in the right breast, unspecified quadrant: Secondary | ICD-10-CM

## 2018-04-16 DIAGNOSIS — N6320 Unspecified lump in the left breast, unspecified quadrant: Secondary | ICD-10-CM

## 2018-04-20 ENCOUNTER — Encounter (HOSPITAL_COMMUNITY): Payer: 59

## 2018-04-21 NOTE — Progress Notes (Signed)
  Radiation Oncology         [phone] ________________________________  Name: Renee Harris MRN: [id number]  Date: 04/08/2018  DOB: 1961/06/22  End of Treatment Note  Diagnosis:   57 y.o. female with Stage IB, cT2N0MX, grade 3, triple positive invasive ductal carcinoma and DCIS with necrosis of the right breast with node involvement at the time of surgery, Stage IIA, pT1cN1aM0, following neoadjuvant therapy   Indication for treatment:  Curative       Radiation treatment dates:   02/22/2018 - 04/08/2018  Site/dose:   The patient initially received a dose of 50.4 Gy in 28 fractions to the right breast and right supraclavicular region using whole-breast tangent fields. This was delivered using a 3-D conformal technique. The patient then received a boost to the seroma. This delivered an additional 10 Gy in 5 fractions using a 3-D technique. The total dose was 60.4 Gy.  Narrative: The patient tolerated radiation treatment relatively well.   The patient had some expected skin irritation as she progressed during treatment. Moist desquamation was not present at the end of treatment.  Plan: The patient has completed radiation treatment. The patient will return to radiation oncology clinic for routine followup in one month. I advised the patient to call or return sooner if they have any questions or concerns related to their recovery or treatment. ________________________________  Jodelle Gross, MD, PhD  This document serves as a record of services personally performed by Kyung Rudd, MD. It was created on his behalf by Rae Lips, a trained medical scribe. The creation of this record is based on the scribe's personal observations and the provider's statements to them. This document has been checked and approved by the attending provider.

## 2018-04-27 ENCOUNTER — Ambulatory Visit (HOSPITAL_COMMUNITY)
Admission: RE | Admit: 2018-04-27 | Discharge: 2018-04-27 | Disposition: A | Discharge status: Home / Self Care | Payer: 59 | Source: Ambulatory Visit | Attending: Internal Medicine | Admitting: Internal Medicine

## 2018-04-27 ENCOUNTER — Ambulatory Visit (HOSPITAL_BASED_OUTPATIENT_CLINIC_OR_DEPARTMENT_OTHER)
Admission: RE | Admit: 2018-04-27 | Discharge: 2018-04-27 | Disposition: A | Discharge status: Home / Self Care | Payer: 59 | Source: Ambulatory Visit | Attending: Cardiology | Admitting: Cardiology

## 2018-04-27 VITALS — BP 141/76 | HR 80 | Wt 239.0 lb

## 2018-04-27 DIAGNOSIS — C50211 Malignant neoplasm of upper-inner quadrant of right female breast: Secondary | ICD-10-CM | POA: Insufficient documentation

## 2018-04-27 DIAGNOSIS — Z170 Estrogen receptor positive status [ER+]: Secondary | ICD-10-CM | POA: Insufficient documentation

## 2018-04-27 DIAGNOSIS — I350 Nonrheumatic aortic (valve) stenosis: Secondary | ICD-10-CM

## 2018-04-27 LAB — LIPID PANEL
CHOL/HDL RATIO: 7.9 ratio
CHOLESTEROL: 214 mg/dL — AB (ref 0–200)
HDL: 27 mg/dL — ABNORMAL LOW (ref >40)
LDL CALC: 108 mg/dL — AB (ref 0–99)
Triglycerides: 393 mg/dL — ABNORMAL HIGH (ref <150)
VLDL: 79 mg/dL — AB (ref 0–40)

## 2018-04-27 NOTE — Progress Notes (Signed)
Oncology: Dr. Lindi Adie  57 yo with history of HTN and breast cancer presents for cardio-oncology followup.  Patient was diagnosed with breast cancer on the right in 7/18.  ER+/PR+/HER2+.  She had lumpectomy in 2/19 and is currently getting Kadcyla to continue until 9/19. She has finished radiation.     She is stable symptomatically.  No exertional dyspnea or chest pain.  No lightheadedness.    PMH: 1. HTN 2. Hypertriglyceridemia 3. Breast cancer: Diagnosed 7/18, on right.  ER+/PR+/HER2+.  She had lumpectomy in 2/19 and has completed radiation.  She is on Kadcyla to 9/19.    - Echo (7/18): EF 65-70%, GLS -19.8%, mild AS with mean gradient 15 mmHg, normal RV size and systolic function.  - Echo (10/18): EF 65-70%, mild LVH, GLS -19.6%, moderate AS with mean gradient 26, AVA 1.3 cm^2.  - Echo (4/19): EF 60-65%, GLS -18.6%, mild AS mean gradient 10 mmHg.  - Echo (7/19): EF 60-65%, GLS -15.2%, normal RV size and systolic function, moderate AS with mean gradient 20 and AVA 1.2 cm^2.  4. Aortic stenosis: Moderate on 7/19 echo.   Social History   Socioeconomic History  . Marital status: Married    Spouse name: Not on file  . Number of children: Not on file  . Years of education: Not on file  . Highest education level: Not on file  Occupational History  . Not on file  Social Needs  . Financial resource strain: Not on file  . Food insecurity:    Worry: Not on file    Inability: Not on file  . Transportation needs:    Medical: Not on file    Non-medical: Not on file  Tobacco Use  . Smoking status: Never Smoker  . Smokeless tobacco: Never Used  Substance and Sexual Activity  . Alcohol use: Yes    Comment: 2 x year  . Drug use: No  . Sexual activity: Yes    Birth control/protection: Surgical  Lifestyle  . Physical activity:    Days per week: Not on file    Minutes per session: Not on file  . Stress: Not on file  Relationships  . Social connections:    Talks on phone: Not on file   Gets together: Not on file    Attends religious service: Not on file    Active member of club or organization: Not on file    Attends meetings of clubs or organizations: Not on file    Relationship status: Not on file  . Intimate partner violence:    Fear of current or ex partner: Not on file    Emotionally abused: Not on file    Physically abused: Not on file    Forced sexual activity: Not on file  Other Topics Concern  . Not on file  Social History Narrative  . Not on file   Family History  Problem Relation Age of Onset  . Lung cancer Maternal Grandmother   . Anesthesia problems Neg Hx   . Hypotension Neg Hx   . Malignant hyperthermia Neg Hx   . Pseudochol deficiency Neg Hx    ROS: All systems reviewed and negative except as per HPI.   Current Outpatient Medications  Medication Sig Dispense Refill  . acetaminophen (TYLENOL) 500 MG tablet Take 1,000 mg by mouth daily as needed for moderate pain or headache.    . Ado-Trastuzumab Emtansine (KADCYLA IV) Inject into the vein.    Marland Kitchen albuterol (PROVENTIL HFA;VENTOLIN HFA) 108 (90 BASE)  MCG/ACT inhaler Inhale 1 puff into the lungs every 6 (six) hours as needed for wheezing or shortness of breath.     . diphenhydrAMINE (BENADRYL) 25 mg capsule Take 25 mg by mouth at bedtime.     Mariane Baumgarten Sodium (STOOL SOFTENER LAXATIVE PO) Take 1 capsule by mouth daily as needed (For constipation.).    Marland Kitchen escitalopram (LEXAPRO) 20 MG tablet Take 10 mg by mouth at bedtime.     . fenofibrate 160 MG tablet Take 160 mg by mouth at bedtime.     . gabapentin (NEURONTIN) 600 MG tablet Take 600 mg by mouth 3 (three) times daily.    Marland Kitchen lidocaine-prilocaine (EMLA) cream Apply 1 application topically daily as needed (port access).     Marland Kitchen loperamide (IMODIUM A-D) 2 MG tablet Take 4 mg by mouth as needed for diarrhea or loose stools.    Marland Kitchen losartan (COZAAR) 50 MG tablet Take 25 mg by mouth at bedtime.     . meclizine (ANTIVERT) 25 MG tablet Take 1 tablet (25 mg total)  by mouth 3 (three) times daily as needed for dizziness. 15 tablet 0  . metFORMIN (GLUCOPHAGE) 500 MG tablet Take 500 mg by mouth 2 (two) times daily with a meal.    . Multiple Vitamin (MULTIVITAMIN) tablet Take 1 tablet by mouth at bedtime.     Marland Kitchen omeprazole (PRILOSEC) 40 MG capsule Take 1 capsule (40 mg total) by mouth daily. (Patient taking differently: Take 40 mg by mouth at bedtime. ) 30 capsule 5  . Skin Protectants, Misc. (EUCERIN) cream Apply 1 application topically as needed for dry skin.     No current facility-administered medications for this encounter.    BP (!) 141/76   Pulse 80   Wt 239 lb (108.4 kg)   SpO2 98%   BMI 45.16 kg/m  General: NAD Neck: No JVD, no thyromegaly or thyroid nodule.  Lungs: Clear to auscultation bilaterally with normal respiratory effort. CV: Nondisplaced PMI.  Heart regular S1/S2, no S3/S4, 3/6 SEM RUSB with clear S2.  No peripheral edema.  No carotid bruit.  Normal pedal pulses.  Abdomen: Soft, nontender, no hepatosplenomegaly, no distention.  Skin: Intact without lesions or rashes.  Neurologic: Alert and oriented x 3.  Psych: Normal affect. Extremities: No clubbing or cyanosis.  HEENT: Normal.   Assessment/Plan: 1. Breast cancer: Patient is currently on Kadcyla, she says that this will continue until 9/19.  Echo today was reviewed, EF stable.  Strain less negative but a different machine was used.  - Continue Kadcyla, repeat echo in 3 months.  This will likely be her last echo.  2. HTN: BP reasonably controlled.   3. Aortic stenosis: Moderate on today's echo, will need to follow over time.   Followup in 3 months with repeat echo.   Loralie Champagne 04/27/2018

## 2018-04-27 NOTE — Patient Instructions (Signed)
Routine lab work today. Will notify you of abnormal results  Follow up and echo in 3 months  

## 2018-04-27 NOTE — Progress Notes (Signed)
  Echocardiogram 2D Echocardiogram has been performed.  Renee Harris 04/27/2018, 11:59 AM

## 2018-04-29 ENCOUNTER — Ambulatory Visit (INDEPENDENT_AMBULATORY_CARE_PROVIDER_SITE_OTHER): Payer: 59 | Admitting: Otolaryngology

## 2018-04-29 DIAGNOSIS — H9313 Tinnitus, bilateral: Secondary | ICD-10-CM

## 2018-04-29 DIAGNOSIS — R42 Dizziness and giddiness: Secondary | ICD-10-CM

## 2018-04-29 DIAGNOSIS — H903 Sensorineural hearing loss, bilateral: Secondary | ICD-10-CM

## 2018-05-03 ENCOUNTER — Telehealth (HOSPITAL_COMMUNITY): Payer: Self-pay | Admitting: *Deleted

## 2018-05-03 DIAGNOSIS — I10 Essential (primary) hypertension: Secondary | ICD-10-CM

## 2018-05-03 MED ORDER — ICOSAPENT ETHYL 1 G PO CAPS: 2.0000 g | ORAL_CAPSULE | Freq: Two times a day (BID) | ORAL | 3 refills | Status: DC

## 2018-05-03 NOTE — Telephone Encounter (Signed)
Result Notes for Lipid Profile   Notes recorded by Darron Doom, RN on 05/03/2018 at 3:55 PM EDT Patient called back, she's agreeable with plan. Medication sent to pharmacy, Lipid panel ordered, and lab appt scheduled. ------  Notes recorded by Darron Doom, RN on 05/03/2018 at 3:35 PM EDT Called but had to leave VM asking for patient to call us back. ------  Notes recorded by Larey Dresser, MD on 05/02/2018 at 11:43 PM EDT Triglycerides still very high. Would try to get her Vascepa 2 g bid. Lipids in 2 months.

## 2018-05-04 ENCOUNTER — Encounter (HOSPITAL_COMMUNITY): Payer: Self-pay

## 2018-05-04 ENCOUNTER — Inpatient Hospital Stay (HOSPITAL_COMMUNITY): Payer: 59

## 2018-05-04 ENCOUNTER — Other Ambulatory Visit (INDEPENDENT_AMBULATORY_CARE_PROVIDER_SITE_OTHER): Payer: Self-pay | Admitting: Otolaryngology

## 2018-05-04 ENCOUNTER — Ambulatory Visit (HOSPITAL_COMMUNITY)
Admission: RE | Admit: 2018-05-04 | Discharge: 2018-05-04 | Disposition: A | Discharge status: Home / Self Care | Payer: 59 | Source: Ambulatory Visit | Attending: Hematology | Admitting: Hematology

## 2018-05-04 DIAGNOSIS — C50211 Malignant neoplasm of upper-inner quadrant of right female breast: Secondary | ICD-10-CM

## 2018-05-04 DIAGNOSIS — IMO0001 Reserved for inherently not codable concepts without codable children: Secondary | ICD-10-CM

## 2018-05-04 DIAGNOSIS — H9042 Sensorineural hearing loss, unilateral, left ear, with unrestricted hearing on the contralateral side: Secondary | ICD-10-CM

## 2018-05-04 HISTORY — DX: Personal history of irradiation: Z92.3

## 2018-05-04 HISTORY — DX: Malignant neoplasm of unspecified site of unspecified female breast: C50.919

## 2018-05-04 HISTORY — DX: Personal history of antineoplastic chemotherapy: Z92.21

## 2018-05-04 LAB — COMPREHENSIVE METABOLIC PANEL
ALBUMIN: 3.8 g/dL (ref 3.5–5.0)
ALT: 40 U/L (ref 0–44)
AST: 54 U/L — AB (ref 15–41)
Alkaline Phosphatase: 84 U/L (ref 38–126)
Anion gap: 6 (ref 5–15)
BILIRUBIN TOTAL: 0.5 mg/dL (ref 0.3–1.2)
BUN: 18 mg/dL (ref 6–20)
CHLORIDE: 105 mmol/L (ref 98–111)
CO2: 28 mmol/L (ref 22–32)
CREATININE: 0.95 mg/dL (ref 0.44–1.00)
Calcium: 10.4 mg/dL — ABNORMAL HIGH (ref 8.9–10.3)
GFR calc Af Amer: >60 mL/min (ref >60)
GLUCOSE: 131 mg/dL — AB (ref 70–99)
Potassium: 3.9 mmol/L (ref 3.5–5.1)
Sodium: 139 mmol/L (ref 135–145)
TOTAL PROTEIN: 7.4 g/dL (ref 6.5–8.1)

## 2018-05-04 LAB — CBC WITH DIFFERENTIAL/PLATELET
BASOS ABS: 0 10*3/uL (ref 0.0–0.1)
BASOS PCT: 0 %
Eosinophils Absolute: 0.2 10*3/uL (ref 0.0–0.7)
Eosinophils Relative: 5 %
HEMATOCRIT: 35.1 % — AB (ref 36.0–46.0)
Hemoglobin: 11.3 g/dL — ABNORMAL LOW (ref 12.0–15.0)
LYMPHS PCT: 25 %
Lymphs Abs: 1.2 10*3/uL (ref 0.7–4.0)
MCH: 28.8 pg (ref 26.0–34.0)
MCHC: 32.2 g/dL (ref 30.0–36.0)
MCV: 89.3 fL (ref 78.0–100.0)
Monocytes Absolute: 0.4 10*3/uL (ref 0.1–1.0)
Monocytes Relative: 9 %
NEUTROS ABS: 2.9 10*3/uL (ref 1.7–7.7)
Neutrophils Relative %: 61 %
Platelets: 124 10*3/uL — ABNORMAL LOW (ref 150–400)
RBC: 3.93 MIL/uL (ref 3.87–5.11)
RDW: 15.5 % (ref 11.5–15.5)
WBC: 4.8 10*3/uL (ref 4.0–10.5)

## 2018-05-05 ENCOUNTER — Telehealth (HOSPITAL_COMMUNITY): Payer: Self-pay | Admitting: Hematology

## 2018-05-05 ENCOUNTER — Inpatient Hospital Stay (HOSPITAL_BASED_OUTPATIENT_CLINIC_OR_DEPARTMENT_OTHER): Payer: 59 | Admitting: Hematology

## 2018-05-05 ENCOUNTER — Other Ambulatory Visit: Payer: Self-pay

## 2018-05-05 ENCOUNTER — Encounter (HOSPITAL_COMMUNITY): Payer: Self-pay | Admitting: Hematology

## 2018-05-05 ENCOUNTER — Inpatient Hospital Stay (HOSPITAL_COMMUNITY): Payer: 59

## 2018-05-05 VITALS — BP 125/52

## 2018-05-05 VITALS — BP 111/65 | HR 75 | Resp 16 | Wt 243.5 lb

## 2018-05-05 DIAGNOSIS — K219 Gastro-esophageal reflux disease without esophagitis: Secondary | ICD-10-CM

## 2018-05-05 DIAGNOSIS — C50919 Malignant neoplasm of unspecified site of unspecified female breast: Secondary | ICD-10-CM

## 2018-05-05 DIAGNOSIS — Z170 Estrogen receptor positive status [ER+]: Principal | ICD-10-CM

## 2018-05-05 DIAGNOSIS — C50211 Malignant neoplasm of upper-inner quadrant of right female breast: Secondary | ICD-10-CM

## 2018-05-05 DIAGNOSIS — I10 Essential (primary) hypertension: Secondary | ICD-10-CM

## 2018-05-05 DIAGNOSIS — Z79811 Long term (current) use of aromatase inhibitors: Secondary | ICD-10-CM

## 2018-05-05 DIAGNOSIS — G4730 Sleep apnea, unspecified: Secondary | ICD-10-CM

## 2018-05-05 DIAGNOSIS — E785 Hyperlipidemia, unspecified: Secondary | ICD-10-CM

## 2018-05-05 DIAGNOSIS — E119 Type 2 diabetes mellitus without complications: Secondary | ICD-10-CM

## 2018-05-05 DIAGNOSIS — G629 Polyneuropathy, unspecified: Secondary | ICD-10-CM

## 2018-05-05 DIAGNOSIS — Z7984 Long term (current) use of oral hypoglycemic drugs: Secondary | ICD-10-CM

## 2018-05-05 DIAGNOSIS — Z5112 Encounter for antineoplastic immunotherapy: Secondary | ICD-10-CM

## 2018-05-05 DIAGNOSIS — Z87442 Personal history of urinary calculi: Secondary | ICD-10-CM

## 2018-05-05 DIAGNOSIS — Z801 Family history of malignant neoplasm of trachea, bronchus and lung: Secondary | ICD-10-CM

## 2018-05-05 DIAGNOSIS — Z9221 Personal history of antineoplastic chemotherapy: Secondary | ICD-10-CM

## 2018-05-05 MED ORDER — SODIUM CHLORIDE 0.9% FLUSH
10.0000 mL | INTRAVENOUS | Status: DC | PRN
  Administered 2018-05-05: 10 mL
  Filled 2018-05-05: qty 10

## 2018-05-05 MED ORDER — ACETAMINOPHEN 325 MG PO TABS
650.0000 mg | ORAL_TABLET | Freq: Once | ORAL | Status: AC
  Administered 2018-05-05: 650 mg via ORAL

## 2018-05-05 MED ORDER — DIPHENHYDRAMINE HCL 25 MG PO CAPS
50.0000 mg | ORAL_CAPSULE | Freq: Once | ORAL | Status: AC
  Administered 2018-05-05: 50 mg via ORAL

## 2018-05-05 MED ORDER — HEPARIN SOD (PORK) LOCK FLUSH 100 UNIT/ML IV SOLN
INTRAVENOUS | Status: AC
  Filled 2018-05-05: qty 5

## 2018-05-05 MED ORDER — DIPHENHYDRAMINE HCL 25 MG PO CAPS
ORAL_CAPSULE | ORAL | Status: AC
  Filled 2018-05-05: qty 2

## 2018-05-05 MED ORDER — SODIUM CHLORIDE 0.9 % IV SOLN
Freq: Once | INTRAVENOUS | Status: AC
  Administered 2018-05-05: 12:00:00 via INTRAVENOUS

## 2018-05-05 MED ORDER — HEPARIN SOD (PORK) LOCK FLUSH 100 UNIT/ML IV SOLN
500.0000 [IU] | Freq: Once | INTRAVENOUS | Status: AC | PRN
  Administered 2018-05-05: 500 [IU]

## 2018-05-05 MED ORDER — SODIUM CHLORIDE 0.9 % IV SOLN
3.6000 mg/kg | Freq: Once | INTRAVENOUS | Status: AC
  Administered 2018-05-05: 420 mg via INTRAVENOUS
  Filled 2018-05-05: qty 16

## 2018-05-05 MED ORDER — ACETAMINOPHEN 325 MG PO TABS
ORAL_TABLET | ORAL | Status: AC
  Filled 2018-05-05: qty 2

## 2018-05-05 NOTE — Addendum Note (Signed)
Addended by: Derek Jack on: 05/05/2018 11:34 AM   Modules accepted: Orders

## 2018-05-05 NOTE — Assessment & Plan Note (Signed)
1.  Clinical T2N0 HER-2 positive right breast cancer: -Biopsy of the right breast upper inner quadrant 1:00 mass, IDC, ER/PR positive, HER-2 positive, Ki-67 of 60% -Neoadjuvant chemotherapy with 6 cycles of TCHP from 05/29/2017 through 10/09/2017 -Right lumpectomy and lymph node biopsy on 12/09/2017, with lumpectomy #1 specimen with no malignancy, lumpectomy #2 specimen showing 2 cm IDC, grade 3, 1 out of 6 lymph nodes positive for malignancy.  (yPT1cyPN1a) - Based on KATHRINE trial, started on Kadcyla 3.6 mg/kg for 14 cycles started on 01/20/2018, tolerating it very well. -I have reviewed echocardiogram dated 04/27/2018 which shows ejection fraction of 60 to 65%. - Patient could not tolerate letrozole resulting in chest pain and right lateral abdominal pain, went to the ER, CT abdomen and pelvis on 02/23/2018 shows stable anterior abdominal wall hernia, discontinued letrozole.  Completed radiation therapy. -We talked about starting her on anastrozole 1 mg daily and see how she tolerates it.  I have reviewed her mammogram dated 05/04/2018 which was BI-RADS Category 2. -She is tolerating Kadcyla very well.  She may proceed with today's infusion.  Her blood counts are adequate and does not require any dose adjustment.  She will be seen back in 6 weeks for follow-up.  2.  Peripheral neuropathy: -She was started on gabapentin 600 mg 3 times a day by her PMD.  Burning sensation on top of her feet has resolved. She has some numbness in the bottom of the feet.  No numbness or tingling in the hands reported.  3.  Mild hypercalcemia: -She has mild hypercalcemia dating back to 2012.  Her vitamin D level was slightly low.  We will check an intact PTH level at next visit.

## 2018-05-05 NOTE — Telephone Encounter (Signed)
FAXED REIMBURSEMENT REQ TO [facility] FOR HER/PER DOS 11/03/17.

## 2018-05-05 NOTE — Progress Notes (Signed)
Renee Harris, [place] [address]   CLINIC:  Medical Oncology/Hematology  PCP:  Renee Harris, [place] [address] [phone]   REASON FOR VISIT:  Follow-up for right breast cancer ER+/PR+/HER2+  CURRENT THERAPY: IV Kadcyla every 3 weeks  BRIEF ONCOLOGIC HISTORY:    Malignant neoplasm of upper-inner quadrant of right breast in female, estrogen receptor positive (Renee Harris)   04/13/2017 Initial Diagnosis    Screening detected right breast mass with calcifications 2.1 cm in size and 3.4 cm in size. Calcifications were fibroadenoma. Right breast biopsy upper inner quadrant 1:00 mass: IDC with DCIS with necrosis, grade 3, ER 100%, PR 70%, Ki-67 60%, HER-2 positive ratio 2.34, T2 N1 stage IB (New AJCC)      04/25/2017 Breast MRI    Right breast upper inner quadrant 2.2 x 1.7 x 2.7 cm mass, abnormal area of clumped non-mass enhancement in the right lateral breast 3.2 x 1.9 x 1 cm, too abnormal lymph nodes right axilla 4.3 and 1.7 cm (biopsy-proven breast cancer)       05/08/2017 Procedure    Right axilla lymph node biopsy: Metastatic carcinoma      05/13/2017 Procedure    Right breast biopsy retroareolar region: Intraductal papilloma      01/13/2018 -  Chemotherapy    The patient had ado-trastuzumab emtansine (KADCYLA) 420 mg in sodium chloride 0.9 % 250 mL chemo infusion, 3.6 mg/kg = 420 mg, Intravenous, Once, 5 of 9 cycles Administration: 420 mg (01/20/2018), 420 mg (02/10/2018), 420 mg (04/14/2018), 420 mg (03/03/2018), 420 mg (03/24/2018)  for chemotherapy treatment.         CANCER STAGING: Cancer Staging Malignant neoplasm of upper-inner quadrant of right breast in female, estrogen receptor positive (Renee Harris) Staging form: Breast, AJCC 8th Edition - Clinical stage from 04/22/2017: Stage IB (cT2, cN0, cM0, G3, ER: Positive, PR: Positive, HER2: Positive) - Unsigned    INTERVAL HISTORY:  Renee Harris 56 y.o. female returns for  routine follow-up for triple positive right breast cancer and consideration for next cycle of chemotherapy. Patient is feeling good and handling treatment well. She states her appetite is improved. Patient still has slight numbness and tingling in her feet but the gabapentin is helping with that. She denies any SOB or chest pain. Patient denies any new pains. Denies any nausea, vomiting, or diarrhea.     REVIEW OF SYSTEMS:  Review of Systems  Constitutional: Positive for fatigue.  Neurological: Positive for dizziness.  All other systems reviewed and are negative.    PAST MEDICAL/SURGICAL HISTORY:  Past Medical History:  Diagnosis Date  . Asthma   . Blood transfusion    as child  . Breast cancer ([place]) 2018  . Cancer ([place])   . Diabetes ([place])    type 2   . GERD (gastroesophageal reflux disease)   . Headache   . Heart murmur    d/t aortic stenosis   . History of kidney stones   . Hyperlipidemia   . Hypertension   . Normal echocardiogram   . Personal history of chemotherapy 2018  . Personal history of radiation therapy 2018  . Sleep apnea    Past Surgical History:  Procedure Laterality Date  . ABDOMINAL HYSTERECTOMY  2004  . BREAST LUMPECTOMY Right 2018  . BREAST LUMPECTOMY WITH RADIOACTIVE SEED AND SENTINEL LYMPH NODE BIOPSY Right 12/09/2017   Procedure: RIGHT BREAST LUMPECTOMY WITH RADIOACTIVE SEED AND SENTINEL LYMPH NODE BIOPSY AND RADIOACTIVE SEED TARGETED RIGHT AXILLARY  LYMPH NODE EXCISION;  Surgeon: Renee Overall, MD;  Location: [facility];  Service: General;  Laterality: Right;  . CHOLECYSTECTOMY  05/30/2011   Procedure: LAPAROSCOPIC CHOLECYSTECTOMY;  Surgeon: Renee Harris;  Location: [facility];  Service: General;  Laterality: N/A;  . 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 KIDNEY STONE SURGERY     2017 2-23  . PORTACATH PLACEMENT N/A 05/25/2017   Procedure: INSERTION PORT-A-CATH;  Surgeon: Renee Overall, MD;  Location: [facility];  Service: General;   Laterality: N/A;     SOCIAL HISTORY:  Social History   Socioeconomic History  . Marital status: Married    Spouse name: Not on file  . Number of children: Not on file  . Years of education: Not on file  . Highest education level: Not on file  Occupational History  . Not on file  Social Needs  . Financial resource strain: Not on file  . Food insecurity:    Worry: Not on file    Inability: Not on file  . Transportation needs:    Medical: Not on file    Non-medical: Not on file  Tobacco Use  . Smoking status: Never Smoker  . Smokeless tobacco: Never Used  Substance and Sexual Activity  . Alcohol use: Yes    Comment: 2 x year  . Drug use: No  . Sexual activity: Yes    Birth control/protection: Surgical  Lifestyle  . Physical activity:    Days per week: Not on file    Minutes per session: Not on file  . Stress: Not on file  Relationships  . Social connections:    Talks on phone: Not on file    Gets together: Not on file    Attends religious service: Not on file    Active member of club or organization: Not on file    Attends meetings of clubs or organizations: Not on file    Relationship status: Not on file  . Intimate partner violence:    Fear of current or ex partner: Not on file    Emotionally abused: Not on file    Physically abused: Not on file    Forced sexual activity: Not on file  Other Topics Concern  . Not on file  Social History Narrative  . Not on file    FAMILY HISTORY:  Family History  Problem Relation Age of Onset  . Lung cancer Maternal Grandmother   . Anesthesia problems Neg Hx   . Hypotension Neg Hx   . Malignant hyperthermia Neg Hx   . Pseudochol deficiency Neg Hx     CURRENT MEDICATIONS:  Outpatient Encounter Medications as of 05/05/2018  Medication Sig Note  . acetaminophen (TYLENOL) 500 MG tablet Take 1,000 mg by mouth daily as needed for moderate pain or headache.   . Ado-Trastuzumab Emtansine (KADCYLA IV) Inject into the vein.  5/14/2019Frances Harris at the [facility].  Marland Kitchen albuterol (PROVENTIL HFA;VENTOLIN HFA) 108 (90 BASE) MCG/ACT inhaler Inhale 1 puff into the lungs every 6 (six) hours as needed for wheezing or shortness of breath.    . diphenhydrAMINE (BENADRYL) 25 mg capsule Take 25 mg by mouth at bedtime.    Mariane Baumgarten Sodium (STOOL SOFTENER LAXATIVE PO) Take 1 capsule by mouth daily as needed (For constipation.).   Marland Kitchen escitalopram (LEXAPRO) 20 MG tablet Take 10 mg by mouth at bedtime.    . fenofibrate 160 MG tablet Take 160 mg by  mouth at bedtime.    . gabapentin (NEURONTIN) 600 MG tablet Take 600 mg by mouth 3 (three) times daily.   Vanessa Kick Ethyl (VASCEPA) 1 g CAPS Take 2 capsules (2 g total) by mouth 2 (two) times daily.   Marland Kitchen lidocaine-prilocaine (EMLA) cream Apply 1 application topically daily as needed (port access).    Marland Kitchen loperamide (IMODIUM A-D) 2 MG tablet Take 4 mg by mouth as needed for diarrhea or loose stools.   Marland Kitchen losartan (COZAAR) 50 MG tablet Take 25 mg by mouth at bedtime.    . meclizine (ANTIVERT) 25 MG tablet Take 1 tablet (25 mg total) by mouth 3 (three) times daily as needed for dizziness.   . metFORMIN (GLUCOPHAGE) 500 MG tablet Take 500 mg by mouth 2 (two) times daily with a meal.   . Multiple Vitamin (MULTIVITAMIN) tablet Take 1 tablet by mouth at bedtime.    Marland Kitchen omeprazole (PRILOSEC) 40 MG capsule Take 1 capsule (40 mg total) by mouth daily. (Patient taking differently: Take 40 mg by mouth at bedtime. )   . Skin Protectants, Misc. (EUCERIN) cream Apply 1 application topically as needed for dry skin.    No facility-administered encounter medications on file as of 05/05/2018.     ALLERGIES:  Allergies  Allergen Reactions  . Latex Itching  . Pyridostigmine Bromide Other (See Comments)    Makes muscles twitch  . Statins Nausea Only     PHYSICAL EXAM:  ECOG Performance status: 1  Vitals:   05/05/18 1059  BP: 111/65  Pulse: 75  Resp: 16  SpO2: 99%   Filed Weights    05/05/18 1059  Weight: 243 lb 8 oz (110.5 kg)    Physical Exam  Constitutional: She is oriented to person, place, and time.  Cardiovascular: Normal rate.  Murmur heard. Pulmonary/Chest: Effort normal and breath sounds normal.  Neurological: She is alert and oriented to person, place, and time.  Skin: Skin is warm and dry.     LABORATORY DATA:  I have reviewed the labs as listed.  CBC    Component Value Date/Time   WBC 4.8 05/04/2018 1003   RBC 3.93 05/04/2018 1003   HGB 11.3 (L) 05/04/2018 1003   HGB 13.8 04/22/2017 0831   HCT 35.1 (L) 05/04/2018 1003   HCT 41.9 04/22/2017 0831   PLT 124 (L) 05/04/2018 1003   PLT 184 04/22/2017 0831   MCV 89.3 05/04/2018 1003   MCV 85.7 04/22/2017 0831   MCH 28.8 05/04/2018 1003   MCHC 32.2 05/04/2018 1003   RDW 15.5 05/04/2018 1003   RDW 13.9 04/22/2017 0831   LYMPHSABS 1.2 05/04/2018 1003   LYMPHSABS 2.1 04/22/2017 0831   MONOABS 0.4 05/04/2018 1003   MONOABS 0.5 04/22/2017 0831   EOSABS 0.2 05/04/2018 1003   EOSABS 0.3 04/22/2017 0831   BASOSABS 0.0 05/04/2018 1003   BASOSABS 0.0 04/22/2017 0831   CMP Latest Ref Rng & Units 05/04/2018 03/23/2018 03/03/2018  Glucose 70 - 99 mg/dL 131(H) 135(H) 167(H)  BUN 6 - 20 mg/dL 18 16 21(H)  Creatinine 0.44 - 1.00 mg/dL 0.95 0.91 0.96  Sodium 135 - 145 mmol/L 139 139 137  Potassium 3.5 - 5.1 mmol/L 3.9 4.0 3.8  Chloride 98 - 111 mmol/L 105 103 103  CO2 22 - 32 mmol/L _0 Calcium 8.9 - 10.3 mg/dL 10.4(H) 10.9(H) 10.3  Total Protein 6.5 - 8.1 g/dL 7.4 7.9 7.3  Total Bilirubin 0.3 - 1.2 mg/dL 0.5 0.5 0.7  Alkaline Phos  38 - 126 U/L 84 87 79  AST 15 - 41 U/L 54(H) 33 31  ALT 0 - 44 U/L 40 32 31       DIAGNOSTIC IMAGING:  I have reviewed her mammogram dated 05/04/2018 and discussed with her.     ASSESSMENT & PLAN:   Malignant neoplasm of upper-inner quadrant of right breast in female, estrogen receptor positive ([place]) 1.  Clinical T2N0 HER-2 positive right breast  cancer: -Biopsy of the right breast upper inner quadrant 1:00 mass, IDC, ER/PR positive, HER-2 positive, Ki-67 of 60% -Neoadjuvant chemotherapy with 6 cycles of TCHP from 05/29/2017 through 10/09/2017 -Right lumpectomy and lymph node biopsy on 12/09/2017, with lumpectomy #1 specimen with no malignancy, lumpectomy #2 specimen showing 2 cm IDC, grade 3, 1 out of 6 lymph nodes positive for malignancy.  (yPT1cyPN1a) - Based on KATHRINE trial, started on Kadcyla 3.6 mg/kg for 14 cycles started on 01/20/2018, tolerating it very well. -I have reviewed echocardiogram dated 04/27/2018 which shows ejection fraction of 60 to 65%. - Patient could not tolerate letrozole resulting in chest pain and right lateral abdominal pain, went to the ER, CT abdomen and pelvis on 02/23/2018 shows stable anterior abdominal wall hernia, discontinued letrozole.  Completed radiation therapy. -We talked about starting her on anastrozole 1 mg daily and see how she tolerates it.  I have reviewed her mammogram dated 05/04/2018 which was BI-RADS Category 2. -She is tolerating Kadcyla very well.  She may proceed with today's infusion.  Her blood counts are adequate and does not require any dose adjustment.  She will be seen back in 6 weeks for follow-up.  2.  Peripheral neuropathy: -She was started on gabapentin 600 mg 3 times a day by her PMD.  Burning sensation on top of her feet has resolved. She has some numbness in the bottom of the feet.  No numbness or tingling in the hands reported.  3.  Mild hypercalcemia: -She has mild hypercalcemia dating back to 2012.  Her vitamin D level was slightly low.  We will check an intact PTH level at next visit.      Orders placed this encounter:  Orders Placed This Encounter  Procedures  . PTH, intact and calcium  . CBC with Differential/Platelet  . Comprehensive metabolic panel  . ECHOCARDIOGRAM COMPLETE      Derek Jack, [place] [phone]

## 2018-05-06 NOTE — Patient Instructions (Signed)
[facility] Discharge Instructions for Patients Receiving Chemotherapy   Beginning January 23rd 2017 lab work for the Cancer Center will be done in the  Main lab at [facility] on 1st floor. If you have a lab appointment with the Cancer Center please come in thru the  Main Entrance and check in at the main information desk   Today you received the following chemotherapy agents   To help prevent nausea and vomiting after your treatment, we encourage you to take your nausea medication     If you develop nausea and vomiting, or diarrhea that is not controlled by your medication, call the clinic.  The clinic phone number is [phone]. Office hours are Monday-Friday 8:30am-5:00pm.  BELOW ARE SYMPTOMS THAT SHOULD BE REPORTED IMMEDIATELY:  *FEVER GREATER THAN 101.0 F  *CHILLS WITH OR WITHOUT FEVER  NAUSEA AND VOMITING THAT IS NOT CONTROLLED WITH YOUR NAUSEA MEDICATION  *UNUSUAL SHORTNESS OF BREATH  *UNUSUAL BRUISING OR BLEEDING  TENDERNESS IN MOUTH AND THROAT WITH OR WITHOUT PRESENCE OF ULCERS  *URINARY PROBLEMS  *BOWEL PROBLEMS  UNUSUAL RASH Items with * indicate a potential emergency and should be followed up as soon as possible. If you have an emergency after office hours please contact your primary care physician or go to the nearest emergency department.  Please call the clinic during office hours if you have any questions or concerns.   You may also contact the Patient Navigator at [phone] should you have any questions or need assistance in obtaining follow up care.      Resources For Cancer Patients and their Caregivers ? American Cancer Society: Can assist with transportation, wigs, general needs, runs Look Good Feel Better.        1-888-227-6333 ? Cancer Care: Provides financial assistance, online support groups, medication/co-pay assistance.  1-800-813-HOPE (4673) ? [facility] Assists [place] cancer  patients and their families through emotional , educational and financial support.  336-427-4357 ? [place] DSS Where to apply for food stamps, Medicaid and utility assistance. 336-342-1394 ? [facility]: Transportation to medical appointments. 336-347-2287 ? Social Security Administration: May apply for disability if have a Stage IV cancer. 336-342-7796 1-800-772-1213 ? [place] [facility]ging, Disability and Transit Services: Assists with nutrition, care and transit needs. 336-349-2343         

## 2018-05-12 ENCOUNTER — Ambulatory Visit (HOSPITAL_COMMUNITY)
Admission: RE | Admit: 2018-05-12 | Discharge: 2018-05-12 | Disposition: A | Discharge status: Home / Self Care | Payer: 59 | Source: Ambulatory Visit | Attending: Otolaryngology | Admitting: Otolaryngology

## 2018-05-12 DIAGNOSIS — IMO0001 Reserved for inherently not codable concepts without codable children: Secondary | ICD-10-CM

## 2018-05-12 DIAGNOSIS — H9042 Sensorineural hearing loss, unilateral, left ear, with unrestricted hearing on the contralateral side: Secondary | ICD-10-CM | POA: Diagnosis present

## 2018-05-12 MED ORDER — GADOBENATE DIMEGLUMINE 529 MG/ML IV SOLN
20.0000 mL | Freq: Once | INTRAVENOUS | Status: AC | PRN
  Administered 2018-05-12: 20 mL via INTRAVENOUS

## 2018-05-24 ENCOUNTER — Ambulatory Visit
Admission: RE | Admit: 2018-05-24 | Discharge: 2018-05-24 | Disposition: A | Discharge status: Home / Self Care | Payer: 59 | Source: Ambulatory Visit | Attending: Radiation Oncology | Admitting: Radiation Oncology

## 2018-05-24 ENCOUNTER — Other Ambulatory Visit: Payer: Self-pay

## 2018-05-24 ENCOUNTER — Telehealth (HOSPITAL_COMMUNITY): Payer: Self-pay | Admitting: Pharmacist

## 2018-05-24 VITALS — BP 134/67 | HR 75 | Temp 97.9°F | Resp 18 | Ht 61.0 in | Wt 244.4 lb

## 2018-05-24 DIAGNOSIS — Z923 Personal history of irradiation: Secondary | ICD-10-CM | POA: Insufficient documentation

## 2018-05-24 DIAGNOSIS — Z170 Estrogen receptor positive status [ER+]: Secondary | ICD-10-CM | POA: Diagnosis not present

## 2018-05-24 DIAGNOSIS — C50211 Malignant neoplasm of upper-inner quadrant of right female breast: Secondary | ICD-10-CM | POA: Diagnosis not present

## 2018-05-24 NOTE — Progress Notes (Signed)
Radiation Oncology         [phone] ________________________________  Name: Renee Harris MRN: [id number]  Date of Service: 05/24/2018  DOB: 1960/10/14  Post Treatment Note  CC: Sharilyn Sites, MD  Alphonsa Overall, MD  Diagnosis:  Stage IB, cT2N0MX, grade 3, triple positive invasive ductal carcinoma and DCIS with necrosis of the right breastwith node involvement at the time of surgery, Stage IIA, pT1cN1aM0, following neoadjuvant therapy   Interval Since Last Radiation:  7 weeks    02/22/2018 - 04/08/2018: The patient initially received a dose of 50.4 Gy in 28 fractions to the right breast and right supraclavicular region using whole-breast tangent fields. This was delivered using a 3-D conformal technique. The patient then received a boost to the seroma. This delivered an additional 10 Gy in 5 fractions using a 3-D technique. The total dose was 60.4 Gy  Narrative:  The patient returns today for routine follow-up. During treatment she did very well with radiotherapy and did not have significant desquamation.                             On review of systems, the patient states she is doing well. She is using Radioplex without difficulty and reports she's feeling well otherwise. She is still slightly fatigued. No other complaints are verbalized.  ALLERGIES:  is allergic to latex; pyridostigmine bromide; and statins.  Meds: Current Outpatient Medications  Medication Sig Dispense Refill  . acetaminophen (TYLENOL) 500 MG tablet Take 1,000 mg by mouth daily as needed for moderate pain or headache.    . Ado-Trastuzumab Emtansine (KADCYLA IV) Inject into the vein.    Marland Kitchen albuterol (PROVENTIL HFA;VENTOLIN HFA) 108 (90 BASE) MCG/ACT inhaler Inhale 1 puff into the lungs every 6 (six) hours as needed for wheezing or shortness of breath.     . diphenhydrAMINE (BENADRYL) 25 mg capsule Take 25 mg by mouth at bedtime.     Mariane Baumgarten Sodium (STOOL SOFTENER LAXATIVE PO) Take 1 capsule by mouth daily  as needed (For constipation.).    Marland Kitchen escitalopram (LEXAPRO) 20 MG tablet Take 10 mg by mouth at bedtime.     . fenofibrate 160 MG tablet Take 160 mg by mouth at bedtime.     . gabapentin (NEURONTIN) 600 MG tablet Take 600 mg by mouth 3 (three) times daily.    Vanessa Kick Ethyl (VASCEPA) 1 g CAPS Take 2 capsules (2 g total) by mouth 2 (two) times daily. 120 capsule 3  . lidocaine-prilocaine (EMLA) cream Apply 1 application topically daily as needed (port access).     Marland Kitchen loperamide (IMODIUM A-D) 2 MG tablet Take 4 mg by mouth as needed for diarrhea or loose stools.    Marland Kitchen losartan (COZAAR) 50 MG tablet Take 25 mg by mouth at bedtime.     . meclizine (ANTIVERT) 25 MG tablet Take 1 tablet (25 mg total) by mouth 3 (three) times daily as needed for dizziness. 15 tablet 0  . metFORMIN (GLUCOPHAGE) 500 MG tablet Take 500 mg by mouth 2 (two) times daily with a meal.    . Multiple Vitamin (MULTIVITAMIN) tablet Take 1 tablet by mouth at bedtime.     Marland Kitchen omeprazole (PRILOSEC) 40 MG capsule Take 1 capsule (40 mg total) by mouth daily. (Patient taking differently: Take 40 mg by mouth at bedtime. ) 30 capsule 5  . Skin Protectants, Misc. (EUCERIN) cream Apply 1 application topically as needed for dry skin.  No current facility-administered medications for this encounter.     Physical Findings:  height is '5\' 1"'  (1.549 m) and weight is 244 lb 6.4 oz (110.9 kg). Her oral temperature is 97.9 F (36.6 C). Her blood pressure is 134/67 and her pulse is 75. Her respiration is 18 and oxygen saturation is 99%.  Pain Assessment Pain Score: 0-No pain/10 In general this is a well appearing caucasian female in no acute distress. She's alert and oriented x4 and appropriate throughout the examination. Cardiopulmonary assessment is negative for acute distress and she exhibits normal effort. The right breast was examined and reveals mild hyperpigmentation without desquamation.   Lab Findings: Lab Results  Component Value Date    WBC 4.8 05/04/2018   HGB 11.3 (L) 05/04/2018   HCT 35.1 (L) 05/04/2018   MCV 89.3 05/04/2018   PLT 124 (L) 05/04/2018     Radiographic Findings: Mr Jeri Cos FI Contrast  Result Date: 05/12/2018 CLINICAL DATA:  57 year old female with left sensorineural hearing loss, bilateral ear ringing and hearing loss left greater than right for 6 months. EXAM: MRI HEAD WITHOUT AND WITH CONTRAST TECHNIQUE: Multiplanar, multiecho pulse sequences of the brain and surrounding structures were obtained without and with intravenous contrast. CONTRAST:  61m MULTIHANCE GADOBENATE DIMEGLUMINE 529 MG/ML IV SOLN COMPARISON:  CTA head and neck 02/20/2018. FINDINGS: Brain: No restricted diffusion to suggest acute infarction. No midline shift, mass effect, evidence of mass lesion, ventriculomegaly, extra-axial collection or acute intracranial hemorrhage. Cervicomedullary junction within normal limits. Cerebral volume is within normal limits. Partially empty sella. Minimal to mild for age scattered small nonspecific cerebral white matter T2 and FLAIR hyperintensity. No cortical encephalomalacia. No chronic cerebral blood products. No abnormal parenchymal enhancement identified. Vascular: Major intracranial vascular flow voids are preserved. Dominant left vertebral artery re- demonstrated. Skull and upper cervical spine: Negative visible cervical spine. Sinuses/Orbits: Disconjugate gaze stable to the prior CTA. Trace left maxillary sinus mucosal thickening or small retention cyst. Paranasal sinuses are otherwise clear. Scalp and visible face soft tissues appear negative. Other: Dedicated internal auditory canal imaging. Tortuosity of the left PICA without mass effect on the brainstem. Normal cerebellopontine angles. Normal bilateral cisternal and intracanalicular 7th and 8th cranial nerve segments. Symmetric and normal appearing T2 signal in the bilateral cochlea and vestibular structures. Trace scattered fluid in the left mastoid  air cells. There is a small conspicuous area of enhancement along the roof of the left mastoid antrum seen on series 15, image 7. On coronal images this appears to be mild dural or venous enhancement associated with the undersurface of the left temporal lobe (series 14, image 6), and when comparing to the CTA in May there is a small area of bony dehiscence at this site (series 10, image 125 on 02/20/2018). However, no overt herniation of brain is identified today (series 13, image 23). No pathologic dural thickening or regional inflammation. The right mastoids are clear. Normal stylomastoid foramina. No other abnormal enhancement identified. No skull base abnormality identified. Both parotid glands appear normal. IMPRESSION: 1. IAC imaging is remarkable for a small 3 mm bone dehiscence at the roof of the left mastoid antrum with associated mild dural enhancement. The bony defect can also be identified on the CTA head 02/20/2018. However, clinical significance of this finding is unclear, as there is not overt brain herniation, and the left mastoid air cells remain well pneumatized. 2. Otherwise negative internal auditory imaging. 3. Mild for age nonspecific cerebral white matter signal changes. Electronically Signed  By: Genevie Ann M.D.   On: 05/12/2018 08:44   Mm Diag Breast Tomo Bilateral  Result Date: 05/04/2018 CLINICAL DATA:  57 year old patient presents for first mammogram following treatment for right breast cancer metastatic to right axillary lymph node(s). The patient also had a retroareolar right breast papilloma biopsied, and surgically excised at the time of lumpectomy. Prior to lumpectomy, she received neoadjuvant chemotherapy. She continues to receive chemotherapy. She is asymptomatic. EXAM: DIGITAL DIAGNOSTIC BILATERAL MAMMOGRAM WITH CAD AND TOMO COMPARISON:  Previous exam(s). ACR Breast Density Category b: There are scattered areas of fibroglandular density. FINDINGS: There are postsurgical changes  in the retroareolar right breast in the upper central right breast. Skin thickening of the anterior right breast is compatible with radiation therapy. A Port-A-Cath projects over the left axilla. No mass, suspicious microcalcification, or nonsurgical distortion is identified in either breast to suggest malignancy. Mammographic images were processed with CAD. IMPRESSION: Post treatment changes of the right breast. No evidence of malignancy bilaterally. RECOMMENDATION: Diagnostic mammogram is suggested in 1 year. (Code:[id number]) I have discussed the findings and recommendations with the patient. Results were also provided in writing at the conclusion of the visit. If applicable, a reminder letter will be sent to the patient regarding the next appointment. BI-RADS CATEGORY  2: Benign. Electronically Signed   By: Curlene Dolphin M.D.   On: 05/04/2018 09:43    Impression/Plan: 1. Stage IB, cT2N0MX, grade 3, triple positive invasive ductal carcinoma and DCIS with necrosis of the right breastwith node involvement at the time of surgery, Stage IIA, pT1cN1aM0, following neoadjuvant therapy. The patient has been doing well since completion of radiotherapy. We discussed that we would be happy to continue to follow her as needed, but she will also continue to follow up with Dr. Delton Coombes in medical oncology for her HER2 targeted infusion. She was counseled on skin care as well as measures to avoid sun exposure to this area.  2. Survivorship. We discussed the importance of survivorship evaluation and she will be scheduled for this in the near future. She was also given the monthly calendar for access to resources offered within the cancer center.     Carola Rhine, PAC

## 2018-05-24 NOTE — Telephone Encounter (Signed)
Vascepa PA approved by [facility] through 05/21/19.   Ruta Hinds. Velva Harman, PharmD, BCPS, CPP Clinical Pharmacist Phone: [phone] 05/24/2018 9:02 AM

## 2018-05-25 ENCOUNTER — Encounter: Payer: Self-pay | Admitting: Radiation Oncology

## 2018-05-26 ENCOUNTER — Encounter (HOSPITAL_COMMUNITY): Payer: Self-pay

## 2018-05-26 ENCOUNTER — Other Ambulatory Visit: Payer: Self-pay

## 2018-05-26 ENCOUNTER — Inpatient Hospital Stay (HOSPITAL_COMMUNITY): Payer: 59

## 2018-05-26 ENCOUNTER — Inpatient Hospital Stay (HOSPITAL_COMMUNITY): Payer: 59 | Attending: Hematology

## 2018-05-26 VITALS — BP 106/59 | HR 67 | Temp 98.1°F | Resp 18 | Wt 242.7 lb

## 2018-05-26 DIAGNOSIS — C50211 Malignant neoplasm of upper-inner quadrant of right female breast: Secondary | ICD-10-CM | POA: Diagnosis present

## 2018-05-26 DIAGNOSIS — Z170 Estrogen receptor positive status [ER+]: Secondary | ICD-10-CM | POA: Diagnosis not present

## 2018-05-26 DIAGNOSIS — Z5111 Encounter for antineoplastic chemotherapy: Secondary | ICD-10-CM | POA: Insufficient documentation

## 2018-05-26 LAB — CBC WITH DIFFERENTIAL/PLATELET
Basophils Absolute: 0 10*3/uL (ref 0.0–0.1)
Basophils Relative: 0 %
EOS ABS: 0.2 10*3/uL (ref 0.0–0.7)
EOS PCT: 4 %
HEMATOCRIT: 36.7 % (ref 36.0–46.0)
Hemoglobin: 11.9 g/dL — ABNORMAL LOW (ref 12.0–15.0)
Lymphocytes Relative: 25 %
Lymphs Abs: 1.1 10*3/uL (ref 0.7–4.0)
MCH: 29.1 pg (ref 26.0–34.0)
MCHC: 32.4 g/dL (ref 30.0–36.0)
MCV: 89.7 fL (ref 78.0–100.0)
MONO ABS: 0.4 10*3/uL (ref 0.1–1.0)
MONOS PCT: 9 %
Neutro Abs: 2.8 10*3/uL (ref 1.7–7.7)
Neutrophils Relative %: 62 %
Platelets: 124 10*3/uL — ABNORMAL LOW (ref 150–400)
RBC: 4.09 MIL/uL (ref 3.87–5.11)
RDW: 15.4 % (ref 11.5–15.5)
WBC: 4.5 10*3/uL (ref 4.0–10.5)

## 2018-05-26 LAB — COMPREHENSIVE METABOLIC PANEL
ALT: 35 U/L (ref 0–44)
ANION GAP: 9 (ref 5–15)
AST: 44 U/L — AB (ref 15–41)
Albumin: 3.8 g/dL (ref 3.5–5.0)
Alkaline Phosphatase: 92 U/L (ref 38–126)
BILIRUBIN TOTAL: 0.9 mg/dL (ref 0.3–1.2)
BUN: 23 mg/dL — AB (ref 6–20)
CO2: 27 mmol/L (ref 22–32)
CREATININE: 0.98 mg/dL (ref 0.44–1.00)
Calcium: 10.7 mg/dL — ABNORMAL HIGH (ref 8.9–10.3)
Chloride: 104 mmol/L (ref 98–111)
GLUCOSE: 170 mg/dL — AB (ref 70–99)
Potassium: 4 mmol/L (ref 3.5–5.1)
Sodium: 140 mmol/L (ref 135–145)
Total Protein: 7.6 g/dL (ref 6.5–8.1)

## 2018-05-26 MED ORDER — ACETAMINOPHEN 325 MG PO TABS: 650.0000 mg | ORAL_TABLET | Freq: Once | ORAL | Status: DC

## 2018-05-26 MED ORDER — DIPHENHYDRAMINE HCL 25 MG PO CAPS: 50.0000 mg | ORAL_CAPSULE | Freq: Once | ORAL | Status: DC

## 2018-05-26 MED ORDER — SODIUM CHLORIDE 0.9 % IV SOLN
Freq: Once | INTRAVENOUS | Status: AC
  Administered 2018-05-26: 13:00:00 via INTRAVENOUS

## 2018-05-26 MED ORDER — SODIUM CHLORIDE 0.9 % IV SOLN
3.6000 mg/kg | Freq: Once | INTRAVENOUS | Status: AC
  Administered 2018-05-26: 420 mg via INTRAVENOUS
  Filled 2018-05-26: qty 16

## 2018-05-26 MED ORDER — ANASTROZOLE 1 MG PO TABS: 1.0000 mg | ORAL_TABLET | Freq: Every day | ORAL | 1 refills | Status: DC

## 2018-05-26 MED ORDER — HEPARIN SOD (PORK) LOCK FLUSH 100 UNIT/ML IV SOLN
500.0000 [IU] | Freq: Once | INTRAVENOUS | Status: AC | PRN
  Administered 2018-05-26: 500 [IU]

## 2018-05-26 NOTE — Progress Notes (Signed)
Tolerated infusion w/o adverse reaction.  Alert, in no distress.  VSS.  Discharged ambulatory.  

## 2018-05-27 LAB — PTH, INTACT AND CALCIUM
Calcium, Total (PTH): 10.7 mg/dL — ABNORMAL HIGH (ref 8.7–10.2)
PTH: 35 pg/mL (ref 15–65)

## 2018-06-16 ENCOUNTER — Inpatient Hospital Stay (HOSPITAL_COMMUNITY): Payer: 59

## 2018-06-16 ENCOUNTER — Inpatient Hospital Stay (HOSPITAL_COMMUNITY): Payer: 59 | Attending: Hematology | Admitting: Hematology

## 2018-06-16 ENCOUNTER — Encounter (HOSPITAL_COMMUNITY): Payer: Self-pay | Admitting: Hematology

## 2018-06-16 ENCOUNTER — Other Ambulatory Visit: Payer: Self-pay

## 2018-06-16 VITALS — BP 91/44 | HR 77 | Temp 98.4°F | Resp 16 | Wt 246.0 lb

## 2018-06-16 VITALS — BP 113/50 | HR 64 | Temp 98.1°F | Resp 18

## 2018-06-16 DIAGNOSIS — G4730 Sleep apnea, unspecified: Secondary | ICD-10-CM | POA: Diagnosis not present

## 2018-06-16 DIAGNOSIS — Z801 Family history of malignant neoplasm of trachea, bronchus and lung: Secondary | ICD-10-CM | POA: Insufficient documentation

## 2018-06-16 DIAGNOSIS — R5383 Other fatigue: Secondary | ICD-10-CM | POA: Diagnosis not present

## 2018-06-16 DIAGNOSIS — Z9221 Personal history of antineoplastic chemotherapy: Secondary | ICD-10-CM | POA: Insufficient documentation

## 2018-06-16 DIAGNOSIS — J45909 Unspecified asthma, uncomplicated: Secondary | ICD-10-CM | POA: Diagnosis not present

## 2018-06-16 DIAGNOSIS — E785 Hyperlipidemia, unspecified: Secondary | ICD-10-CM | POA: Diagnosis not present

## 2018-06-16 DIAGNOSIS — E119 Type 2 diabetes mellitus without complications: Secondary | ICD-10-CM

## 2018-06-16 DIAGNOSIS — Z170 Estrogen receptor positive status [ER+]: Secondary | ICD-10-CM | POA: Insufficient documentation

## 2018-06-16 DIAGNOSIS — Z87442 Personal history of urinary calculi: Secondary | ICD-10-CM | POA: Insufficient documentation

## 2018-06-16 DIAGNOSIS — Z79899 Other long term (current) drug therapy: Secondary | ICD-10-CM

## 2018-06-16 DIAGNOSIS — C50211 Malignant neoplasm of upper-inner quadrant of right female breast: Secondary | ICD-10-CM | POA: Diagnosis present

## 2018-06-16 DIAGNOSIS — G629 Polyneuropathy, unspecified: Secondary | ICD-10-CM | POA: Insufficient documentation

## 2018-06-16 DIAGNOSIS — K5900 Constipation, unspecified: Secondary | ICD-10-CM

## 2018-06-16 DIAGNOSIS — K219 Gastro-esophageal reflux disease without esophagitis: Secondary | ICD-10-CM | POA: Diagnosis not present

## 2018-06-16 DIAGNOSIS — I10 Essential (primary) hypertension: Secondary | ICD-10-CM | POA: Diagnosis not present

## 2018-06-16 DIAGNOSIS — Z5112 Encounter for antineoplastic immunotherapy: Secondary | ICD-10-CM

## 2018-06-16 DIAGNOSIS — Z923 Personal history of irradiation: Secondary | ICD-10-CM

## 2018-06-16 LAB — COMPREHENSIVE METABOLIC PANEL
ALBUMIN: 3.6 g/dL (ref 3.5–5.0)
ALK PHOS: 90 U/L (ref 38–126)
ALT: 38 U/L (ref 0–44)
ANION GAP: 9 (ref 5–15)
AST: 50 U/L — ABNORMAL HIGH (ref 15–41)
BILIRUBIN TOTAL: 0.6 mg/dL (ref 0.3–1.2)
BUN: 19 mg/dL (ref 6–20)
CALCIUM: 10.4 mg/dL — AB (ref 8.9–10.3)
CO2: 25 mmol/L (ref 22–32)
Chloride: 105 mmol/L (ref 98–111)
Creatinine, Ser: 0.96 mg/dL (ref 0.44–1.00)
GLUCOSE: 221 mg/dL — AB (ref 70–99)
Potassium: 3.8 mmol/L (ref 3.5–5.1)
Sodium: 139 mmol/L (ref 135–145)
TOTAL PROTEIN: 7.5 g/dL (ref 6.5–8.1)

## 2018-06-16 LAB — CBC WITH DIFFERENTIAL/PLATELET
BASOS ABS: 0 10*3/uL (ref 0.0–0.1)
BASOS PCT: 0 %
EOS PCT: 4 %
Eosinophils Absolute: 0.2 10*3/uL (ref 0.0–0.7)
HEMATOCRIT: 35.9 % — AB (ref 36.0–46.0)
Hemoglobin: 11.5 g/dL — ABNORMAL LOW (ref 12.0–15.0)
Lymphocytes Relative: 23 %
Lymphs Abs: 1 10*3/uL (ref 0.7–4.0)
MCH: 28.9 pg (ref 26.0–34.0)
MCHC: 32 g/dL (ref 30.0–36.0)
MCV: 90.2 fL (ref 78.0–100.0)
MONO ABS: 0.4 10*3/uL (ref 0.1–1.0)
Monocytes Relative: 9 %
Neutro Abs: 2.8 10*3/uL (ref 1.7–7.7)
Neutrophils Relative %: 64 %
PLATELETS: 129 10*3/uL — AB (ref 150–400)
RBC: 3.98 MIL/uL (ref 3.87–5.11)
RDW: 15.2 % (ref 11.5–15.5)
WBC: 4.3 10*3/uL (ref 4.0–10.5)

## 2018-06-16 LAB — LACTATE DEHYDROGENASE: LDH: 107 U/L (ref 98–192)

## 2018-06-16 MED ORDER — SODIUM CHLORIDE 0.9% FLUSH
10.0000 mL | INTRAVENOUS | Status: DC | PRN
  Administered 2018-06-16: 10 mL
  Filled 2018-06-16: qty 10

## 2018-06-16 MED ORDER — SODIUM CHLORIDE 0.9 % IV SOLN
3.6000 mg/kg | Freq: Once | INTRAVENOUS | Status: AC
  Administered 2018-06-16: 420 mg via INTRAVENOUS
  Filled 2018-06-16: qty 16

## 2018-06-16 MED ORDER — ACETAMINOPHEN 325 MG PO TABS: 650.0000 mg | ORAL_TABLET | Freq: Once | ORAL | Status: DC

## 2018-06-16 MED ORDER — HEPARIN SOD (PORK) LOCK FLUSH 100 UNIT/ML IV SOLN
500.0000 [IU] | Freq: Once | INTRAVENOUS | Status: AC | PRN
  Administered 2018-06-16: 500 [IU]
  Filled 2018-06-16: qty 5

## 2018-06-16 MED ORDER — DIPHENHYDRAMINE HCL 25 MG PO CAPS: 50.0000 mg | ORAL_CAPSULE | Freq: Once | ORAL | Status: DC

## 2018-06-16 MED ORDER — SODIUM CHLORIDE 0.9 % IV SOLN
Freq: Once | INTRAVENOUS | Status: AC
  Administered 2018-06-16: 10:00:00 via INTRAVENOUS

## 2018-06-16 NOTE — Patient Instructions (Signed)
[facility] Discharge Instructions for Patients Receiving Chemotherapy   Beginning January 23rd 2017 lab work for the Cancer Center will be done in the  Main lab at  on 1st floor. If you have a lab appointment with the Cancer Center please come in thru the  Main Entrance and check in at the main information desk   Today you received the following chemotherapy agents   To help prevent nausea and vomiting after your treatment, we encourage you to take your nausea medication     If you develop nausea and vomiting, or diarrhea that is not controlled by your medication, call the clinic.  The clinic phone number is [phone]. Office hours are Monday-Friday 8:30am-5:00pm.  BELOW ARE SYMPTOMS THAT SHOULD BE REPORTED IMMEDIATELY:  *FEVER GREATER THAN 101.0 F  *CHILLS WITH OR WITHOUT FEVER  NAUSEA AND VOMITING THAT IS NOT CONTROLLED WITH YOUR NAUSEA MEDICATION  *UNUSUAL SHORTNESS OF BREATH  *UNUSUAL BRUISING OR BLEEDING  TENDERNESS IN MOUTH AND THROAT WITH OR WITHOUT PRESENCE OF ULCERS  *URINARY PROBLEMS  *BOWEL PROBLEMS  UNUSUAL RASH Items with * indicate a potential emergency and should be followed up as soon as possible. If you have an emergency after office hours please contact your primary care physician or go to the nearest emergency department.  Please call the clinic during office hours if you have any questions or concerns.   You may also contact the Patient Navigator at [phone] should you have any questions or need assistance in obtaining follow up care.      Resources For Cancer Patients and their Caregivers ? [employer]: Can assist with transportation, wigs, general needs, runs Look Good Feel Better.        1-888-227-6333 ? Cancer Care: Provides financial assistance, online support groups, medication/co-pay assistance.  1-800-813-HOPE (4673) ? [facility] Assists [place] cancer  patients and their families through emotional , educational and financial support.  336-427-4357 ? [place] DSS Where to apply for food stamps, Medicaid and utility assistance. 336-342-1394 ? [facility]: Transportation to medical appointments. 336-347-2287 ? Social Security Administration: May apply for disability if have a Stage IV cancer. 336-342-7796 1-800-772-1213 ? [place] [facility], Disability and Transit [facility]: Assists with nutrition, care and transit needs. 336-349-2343         

## 2018-06-16 NOTE — Progress Notes (Signed)
Labs reviewed in follow up visit today with Dr. Delton Coombes. Proceed with treatment today per MD.  Treatment given per orders. Patient tolerated it well without problems. Vitals stable and discharged home from clinic ambulatory. Follow up as scheduled.

## 2018-06-16 NOTE — Progress Notes (Signed)
[place], [place] [address]   CLINIC:  Medical Oncology/Hematology  PCP:  Sharilyn Sites, MD [address] [place] [address] [phone]   REASON FOR VISIT: Follow-up for right breast cancer, ER+/PR+/HER2+  CURRENT THERAPY: IV Kadcyla every 3 weeks  BRIEF ONCOLOGIC HISTORY:    Malignant neoplasm of upper-inner quadrant of right breast in female, estrogen receptor positive ([place] [facility])   04/13/2017 Initial Diagnosis    Screening detected right breast mass with calcifications 2.1 cm in size and 3.4 cm in size. Calcifications were fibroadenoma. Right breast biopsy upper inner quadrant 1:00 mass: IDC with DCIS with necrosis, grade 3, ER 100%, PR 70%, Ki-67 60%, HER-2 positive ratio 2.34, T2 N1 stage IB (New AJCC)    04/25/2017 Breast MRI    Right breast upper inner quadrant 2.2 x 1.7 x 2.7 cm mass, abnormal area of clumped non-mass enhancement in the right lateral breast 3.2 x 1.9 x 1 cm, too abnormal lymph nodes right axilla 4.3 and 1.7 cm (biopsy-proven breast cancer)     05/08/2017 Procedure    Right axilla lymph node biopsy: Metastatic carcinoma    05/13/2017 Procedure    Right breast biopsy retroareolar region: Intraductal papilloma    01/13/2018 -  Chemotherapy    The patient had ado-trastuzumab emtansine (KADCYLA) 420 mg in sodium chloride 0.9 % 250 mL chemo infusion, 3.6 mg/kg = 420 mg, Intravenous, Once, 8 of 14 cycles Administration: 420 mg (01/20/2018), 420 mg (02/10/2018), 420 mg (04/14/2018), 420 mg (03/03/2018), 420 mg (03/24/2018), 420 mg (05/05/2018), 420 mg (05/26/2018), 420 mg (06/16/2018)  for chemotherapy treatment.       CANCER STAGING: Cancer Staging Malignant neoplasm of upper-inner quadrant of right breast in female, estrogen receptor positive ([facility] [facility]) Staging form: Breast, AJCC 8th Edition - Clinical stage from 04/22/2017: Stage IB (cT2, cN0, cM0, G3, ER: Positive, PR: Positive, HER2: Positive) - Unsigned    INTERVAL HISTORY:    Renee Harris 57 y.o. female returns for routine follow-up for breast cancer. Patient is here today doing well with her treatment. She has fatigue throughout the day. Her numbness in her fingers are stable at this time. Patient reports her appetite is 100% and she is maintaining her weight. Her energy level is 75%. Patient denies any new pains. Denies any nausea, vomiting, or diarrhea. Denies skin rash or itching.     REVIEW OF SYSTEMS:  Review of Systems  Constitutional: Positive for fatigue.  Gastrointestinal: Positive for constipation.  Neurological: Positive for dizziness and numbness (fingers and feet).  All other systems reviewed and are negative.    PAST MEDICAL/SURGICAL HISTORY:  Past Medical History:  Diagnosis Date  . Asthma   . Blood transfusion    as child  . Breast cancer ([facility]) 2018  . Cancer ([facility])   . Diabetes ([facility])    type 2   . GERD (gastroesophageal reflux disease)   . Headache   . Heart murmur    d/t aortic stenosis   . History of kidney stones   . Hyperlipidemia   . Hypertension   . Normal echocardiogram   . Personal history of chemotherapy 2018  . Personal history of radiation therapy 2018  . Sleep apnea    Past Surgical History:  Procedure Laterality Date  . ABDOMINAL HYSTERECTOMY  2004  . BREAST LUMPECTOMY Right 2018  . BREAST LUMPECTOMY WITH RADIOACTIVE SEED AND SENTINEL LYMPH NODE BIOPSY Right 12/09/2017   Procedure: RIGHT BREAST LUMPECTOMY WITH RADIOACTIVE SEED AND SENTINEL LYMPH  NODE BIOPSY AND RADIOACTIVE SEED TARGETED RIGHT AXILLARY LYMPH NODE EXCISION;  Surgeon: Alphonsa Overall, MD;  Location: [facility];  Service: General;  Laterality: Right;  . CHOLECYSTECTOMY  05/30/2011   Procedure: LAPAROSCOPIC CHOLECYSTECTOMY;  Surgeon: Donato Heinz;  Location: [facility];  Service: General;  Laterality: N/A;  . 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 KIDNEY STONE SURGERY     2017 2-23  . PORTACATH PLACEMENT N/A 05/25/2017   Procedure:  INSERTION PORT-A-CATH;  Surgeon: Alphonsa Overall, MD;  Location: [facility];  Service: General;  Laterality: N/A;     SOCIAL HISTORY:  Social History   Socioeconomic History  . Marital status: Married    Spouse name: Not on file  . Number of children: Not on file  . Years of education: Not on file  . Highest education level: Not on file  Occupational History  . Not on file  Social Needs  . Financial resource strain: Not on file  . Food insecurity:    Worry: Not on file    Inability: Not on file  . Transportation needs:    Medical: Not on file    Non-medical: Not on file  Tobacco Use  . Smoking status: Never Smoker  . Smokeless tobacco: Never Used  Substance and Sexual Activity  . Alcohol use: Yes    Comment: 2 x year  . Drug use: No  . Sexual activity: Yes    Birth control/protection: Surgical  Lifestyle  . Physical activity:    Days per week: Not on file    Minutes per session: Not on file  . Stress: Not on file  Relationships  . Social connections:    Talks on phone: Not on file    Gets together: Not on file    Attends religious service: Not on file    Active member of club or organization: Not on file    Attends meetings of clubs or organizations: Not on file    Relationship status: Not on file  . Intimate partner violence:    Fear of current or ex partner: No    Emotionally abused: No    Physically abused: No    Forced sexual activity: No  Other Topics Concern  . Not on file  Social History Narrative  . Not on file    FAMILY HISTORY:  Family History  Problem Relation Age of Onset  . Lung cancer Maternal Grandmother   . Anesthesia problems Neg Hx   . Hypotension Neg Hx   . Malignant hyperthermia Neg Hx   . Pseudochol deficiency Neg Hx     CURRENT MEDICATIONS:  Outpatient Encounter Medications as of 06/16/2018  Medication Sig Note  . acetaminophen (TYLENOL) 500 MG tablet Take 1,000 mg by mouth daily as needed for moderate pain or headache.   .  Ado-Trastuzumab Emtansine (KADCYLA IV) Inject into the vein. 5/14/2019[facility] [place] [facility].  Marland Kitchen albuterol (PROVENTIL HFA;VENTOLIN HFA) 108 (90 BASE) MCG/ACT inhaler Inhale 1 puff into the lungs every 6 (six) hours as needed for wheezing or shortness of breath.    . anastrozole (ARIMIDEX) 1 MG tablet Take 1 tablet (1 mg total) by mouth daily.   . diphenhydrAMINE (BENADRYL) 25 mg capsule Take 25 mg by mouth at bedtime.    Mariane Baumgarten Sodium (STOOL SOFTENER LAXATIVE PO) Take 1 capsule by mouth daily as needed (For constipation.).   Marland Kitchen Enalapril Maleate (VASOTEC PO) Take by mouth.   Marland Kitchen  escitalopram (LEXAPRO) 20 MG tablet Take 10 mg by mouth at bedtime.    . fenofibrate 160 MG tablet Take 160 mg by mouth at bedtime.    . gabapentin (NEURONTIN) 600 MG tablet Take 600 mg by mouth 3 (three) times daily.   Vanessa Kick Ethyl (VASCEPA) 1 g CAPS Take 2 capsules (2 g total) by mouth 2 (two) times daily.   Marland Kitchen lidocaine-prilocaine (EMLA) cream Apply 1 application topically daily as needed (port access).    Marland Kitchen loperamide (IMODIUM A-D) 2 MG tablet Take 4 mg by mouth as needed for diarrhea or loose stools.   Marland Kitchen losartan (COZAAR) 50 MG tablet Take 25 mg by mouth at bedtime.    . meclizine (ANTIVERT) 25 MG tablet Take 1 tablet (25 mg total) by mouth 3 (three) times daily as needed for dizziness.   . metFORMIN (GLUCOPHAGE) 500 MG tablet Take 500 mg by mouth 2 (two) times daily with a meal.   . Multiple Vitamin (MULTIVITAMIN) tablet Take 1 tablet by mouth at bedtime.    Marland Kitchen omeprazole (PRILOSEC) 40 MG capsule Take 1 capsule (40 mg total) by mouth daily. (Patient taking differently: Take 40 mg by mouth at bedtime. )   . Skin Protectants, Misc. (EUCERIN) cream Apply 1 application topically as needed for dry skin.    No facility-administered encounter medications on file as of 06/16/2018.     ALLERGIES:  Allergies  Allergen Reactions  . Latex Itching  . Pyridostigmine Bromide Other (See Comments)     Makes muscles twitch  . Statins Nausea Only     PHYSICAL EXAM:  ECOG Performance status: 1  Vitals:   06/16/18 0926  BP: (!) 91/44  Pulse: 77  Resp: 16  Temp: 98.4 F (36.9 C)  SpO2: 97%   Filed Weights   06/16/18 0926  Weight: 246 lb (111.6 kg)    Physical Exam  Constitutional: She is oriented to person, place, and time. She appears well-developed and well-nourished.  Cardiovascular: Normal rate, regular rhythm and normal heart sounds.  Pulmonary/Chest: Effort normal and breath sounds normal.  Neurological: She is alert and oriented to person, place, and time.  Skin: Skin is warm and dry.  Psychiatric: She has a normal mood and affect. Her behavior is normal. Judgment and thought content normal.     LABORATORY DATA:  I have reviewed the labs as listed.  CBC    Component Value Date/Time   WBC 4.3 06/16/2018 0839   RBC 3.98 06/16/2018 0839   HGB 11.5 (L) 06/16/2018 0839   HGB 13.8 04/22/2017 0831   HCT 35.9 (L) 06/16/2018 0839   HCT 41.9 04/22/2017 0831   PLT 129 (L) 06/16/2018 0839   PLT 184 04/22/2017 0831   MCV 90.2 06/16/2018 0839   MCV 85.7 04/22/2017 0831   MCH 28.9 06/16/2018 0839   MCHC 32.0 06/16/2018 0839   RDW 15.2 06/16/2018 0839   RDW 13.9 04/22/2017 0831   LYMPHSABS 1.0 06/16/2018 0839   LYMPHSABS 2.1 04/22/2017 0831   MONOABS 0.4 06/16/2018 0839   MONOABS 0.5 04/22/2017 0831   EOSABS 0.2 06/16/2018 0839   EOSABS 0.3 04/22/2017 0831   BASOSABS 0.0 06/16/2018 0839   BASOSABS 0.0 04/22/2017 0831   CMP Latest Ref Rng & Units 06/16/2018 05/26/2018 05/26/2018  Glucose 70 - 99 mg/dL 221(H) 170(H) -  BUN 6 - 20 mg/dL 19 23(H) -  Creatinine 0.44 - 1.00 mg/dL 0.96 0.98 -  Sodium 135 - 145 mmol/L 139 140 -  Potassium 3.5 -  5.1 mmol/L 3.8 4.0 -  Chloride 98 - 111 mmol/L 105 104 -  CO2 22 - 32 mmol/L 25 27 -  Calcium 8.9 - 10.3 mg/dL 10.4(H) 10.7(H) 10.7(H)  Total Protein 6.5 - 8.1 g/dL 7.5 7.6 -  Total Bilirubin 0.3 - 1.2 mg/dL 0.6 0.9 -  Alkaline  Phos 38 - 126 U/L 90 92 -  AST 15 - 41 U/L 50(H) 44(H) -  ALT 0 - 44 U/L 38 35 -         ASSESSMENT & PLAN:   Malignant neoplasm of upper-inner quadrant of right breast in female, estrogen receptor positive ([place]) 1.  Clinical T2N0 HER-2 positive right breast cancer: -Biopsy of the right breast upper inner quadrant 1:00 mass, IDC, ER/PR positive, HER-2 positive, Ki-67 of 60% -Neoadjuvant chemotherapy with 6 cycles of TCHP from 05/29/2017 through 10/09/2017 -Right lumpectomy and lymph node biopsy on 12/09/2017, with lumpectomy #1 specimen with no malignancy, lumpectomy #2 specimen showing 2 cm IDC, grade 3, 1 out of 6 lymph nodes positive for malignancy.  (yPT1cyPN1a) - Based on KATHRINE trial, started on Kadcyla 3.6 mg/kg for 14 cycles started on 01/20/2018, tolerating it very well. -I have reviewed echocardiogram dated 04/27/2018 which shows ejection fraction of 60 to 65%. - Patient could not tolerate letrozole resulting in chest pain and right lateral abdominal pain, went to the ER, CT abdomen and pelvis on 02/23/2018 shows stable anterior abdominal wall hernia, discontinued letrozole.  Completed radiation therapy. -Her mammogram on 05/04/2018 reviewed by me was BI-RADS Category 2. - We started her on anastrozole daily on 05/05/2018.  She started taking it 2-1/2 weeks ago and is tolerating it very well.  She did not have any chest pains at this time.   -She is also tolerating Kadcyla very well.  I have reviewed her labs today.  She will continue fentanyl every 3 weeks.  I will see her back in 6 weeks. -I plan to repeat 2D echocardiogram in mid-October.  2.  Peripheral neuropathy: -She is continuing gabapentin 600 mg 3 times a day, started by her PMD.  This has been stable in the feet.  She noticed some tingling in the left hand fingertips for last 1 week.  3.  Mild hypercalcemia: -She has mild hypercalcemia dating back to 2012.  Her vitamin D level was slightly low.  PTH was in the normal  range at 35.      Orders placed this encounter:  Orders Placed This Encounter  Procedures  . CBC with Differential/Platelet  . Comprehensive metabolic panel  . CBC with Differential/Platelet  . Comprehensive metabolic panel  . Lactate dehydrogenase      Derek Jack, MD [place] [phone]

## 2018-06-16 NOTE — Assessment & Plan Note (Signed)
1.  Clinical T2N0 HER-2 positive right breast cancer: -Biopsy of the right breast upper inner quadrant 1:00 mass, IDC, ER/PR positive, HER-2 positive, Ki-67 of 60% -Neoadjuvant chemotherapy with 6 cycles of TCHP from 05/29/2017 through 10/09/2017 -Right lumpectomy and lymph node biopsy on 12/09/2017, with lumpectomy #1 specimen with no malignancy, lumpectomy #2 specimen showing 2 cm IDC, grade 3, 1 out of 6 lymph nodes positive for malignancy.  (yPT1cyPN1a) - Based on KATHRINE trial, started on Kadcyla 3.6 mg/kg for 14 cycles started on 01/20/2018, tolerating it very well. -I have reviewed echocardiogram dated 04/27/2018 which shows ejection fraction of 60 to 65%. - Patient could not tolerate letrozole resulting in chest pain and right lateral abdominal pain, went to the ER, CT abdomen and pelvis on 02/23/2018 shows stable anterior abdominal wall hernia, discontinued letrozole.  Completed radiation therapy. -Her mammogram on 05/04/2018 reviewed by me was BI-RADS Category 2. - We started her on anastrozole daily on 05/05/2018.  She started taking it 2-1/2 weeks ago and is tolerating it very well.  She did not have any chest pains at this time.   -She is also tolerating Kadcyla very well.  I have reviewed her labs today.  She will continue fentanyl every 3 weeks.  I will see her back in 6 weeks. -I plan to repeat 2D echocardiogram in mid-October.  2.  Peripheral neuropathy: -She is continuing gabapentin 600 mg 3 times a day, started by her PMD.  This has been stable in the feet.  She noticed some tingling in the left hand fingertips for last 1 week.  3.  Mild hypercalcemia: -She has mild hypercalcemia dating back to 2012.  Her vitamin D level was slightly low.  PTH was in the normal range at 35.

## 2018-06-26 ENCOUNTER — Other Ambulatory Visit (HOSPITAL_COMMUNITY): Payer: Self-pay | Admitting: Hematology

## 2018-06-28 ENCOUNTER — Ambulatory Visit (INDEPENDENT_AMBULATORY_CARE_PROVIDER_SITE_OTHER): Payer: 59 | Admitting: Otolaryngology

## 2018-07-05 ENCOUNTER — Other Ambulatory Visit (HOSPITAL_COMMUNITY): Payer: 59

## 2018-07-08 ENCOUNTER — Other Ambulatory Visit: Payer: Self-pay

## 2018-07-08 ENCOUNTER — Inpatient Hospital Stay (HOSPITAL_COMMUNITY): Payer: 59

## 2018-07-08 ENCOUNTER — Encounter (HOSPITAL_COMMUNITY): Payer: Self-pay

## 2018-07-08 VITALS — BP 124/73 | HR 85 | Temp 98.3°F | Resp 18 | Wt 246.6 lb

## 2018-07-08 DIAGNOSIS — Z170 Estrogen receptor positive status [ER+]: Principal | ICD-10-CM

## 2018-07-08 DIAGNOSIS — C50211 Malignant neoplasm of upper-inner quadrant of right female breast: Secondary | ICD-10-CM

## 2018-07-08 LAB — CBC WITH DIFFERENTIAL/PLATELET
Basophils Absolute: 0 10*3/uL (ref 0.0–0.1)
Basophils Relative: 1 %
EOS PCT: 4 %
Eosinophils Absolute: 0.2 10*3/uL (ref 0.0–0.7)
HEMATOCRIT: 36.4 % (ref 36.0–46.0)
Hemoglobin: 11.3 g/dL — ABNORMAL LOW (ref 12.0–15.0)
Lymphocytes Relative: 26 %
Lymphs Abs: 1.1 10*3/uL (ref 0.7–4.0)
MCH: 28.3 pg (ref 26.0–34.0)
MCHC: 31 g/dL (ref 30.0–36.0)
MCV: 91 fL (ref 78.0–100.0)
MONO ABS: 0.4 10*3/uL (ref 0.1–1.0)
MONOS PCT: 10 %
NEUTROS ABS: 2.6 10*3/uL (ref 1.7–7.7)
Neutrophils Relative %: 59 %
PLATELETS: 117 10*3/uL — AB (ref 150–400)
RBC: 4 MIL/uL (ref 3.87–5.11)
RDW: 15.4 % (ref 11.5–15.5)
WBC: 4.3 10*3/uL (ref 4.0–10.5)

## 2018-07-08 LAB — COMPREHENSIVE METABOLIC PANEL
ALBUMIN: 3.6 g/dL (ref 3.5–5.0)
ALK PHOS: 80 U/L (ref 38–126)
ALT: 31 U/L (ref 0–44)
ANION GAP: 9 (ref 5–15)
AST: 43 U/L — ABNORMAL HIGH (ref 15–41)
BUN: 17 mg/dL (ref 6–20)
CO2: 25 mmol/L (ref 22–32)
Calcium: 10.4 mg/dL — ABNORMAL HIGH (ref 8.9–10.3)
Chloride: 106 mmol/L (ref 98–111)
Creatinine, Ser: 0.86 mg/dL (ref 0.44–1.00)
GFR calc Af Amer: >60 mL/min (ref >60)
GFR calc non Af Amer: >60 mL/min (ref >60)
Glucose, Bld: 150 mg/dL — ABNORMAL HIGH (ref 70–99)
Potassium: 3.8 mmol/L (ref 3.5–5.1)
SODIUM: 140 mmol/L (ref 135–145)
TOTAL PROTEIN: 7.3 g/dL (ref 6.5–8.1)
Total Bilirubin: 0.4 mg/dL (ref 0.3–1.2)

## 2018-07-08 MED ORDER — DIPHENHYDRAMINE HCL 25 MG PO CAPS: 50.0000 mg | ORAL_CAPSULE | Freq: Once | ORAL | Status: DC

## 2018-07-08 MED ORDER — SODIUM CHLORIDE 0.9 % IV SOLN
3.6000 mg/kg | Freq: Once | INTRAVENOUS | Status: AC
  Administered 2018-07-08: 420 mg via INTRAVENOUS
  Filled 2018-07-08: qty 16

## 2018-07-08 MED ORDER — SODIUM CHLORIDE 0.9% FLUSH
10.0000 mL | INTRAVENOUS | Status: DC | PRN
  Administered 2018-07-08: 10 mL
  Filled 2018-07-08: qty 10

## 2018-07-08 MED ORDER — HEPARIN SOD (PORK) LOCK FLUSH 100 UNIT/ML IV SOLN
500.0000 [IU] | Freq: Once | INTRAVENOUS | Status: AC | PRN
  Administered 2018-07-08: 500 [IU]

## 2018-07-08 MED ORDER — SODIUM CHLORIDE 0.9 % IV SOLN
Freq: Once | INTRAVENOUS | Status: AC
  Administered 2018-07-08: 10:00:00 via INTRAVENOUS

## 2018-07-08 MED ORDER — ACETAMINOPHEN 325 MG PO TABS: 650.0000 mg | ORAL_TABLET | Freq: Once | ORAL | Status: DC

## 2018-07-08 NOTE — Progress Notes (Signed)
Treatment given per orders. Patient tolerated it well without problems. Vitals stable and discharged home from clinic ambulatory. Follow up as scheduled.  

## 2018-07-08 NOTE — Patient Instructions (Signed)
[facility] Discharge Instructions for Patients Receiving Chemotherapy   Beginning January 23rd 2017 lab work for the Cancer Center will be done in the  Main lab at [place] on 1st floor. If you have a lab appointment with the Cancer Center please come in thru the  Main Entrance and check in at the main information desk   Today you received the following chemotherapy agents   To help prevent nausea and vomiting after your treatment, we encourage you to take your nausea medication     If you develop nausea and vomiting, or diarrhea that is not controlled by your medication, call the clinic.  The clinic phone number is [phone]. Office hours are Monday-Friday 8:30am-5:00pm.  BELOW ARE SYMPTOMS THAT SHOULD BE REPORTED IMMEDIATELY:  *FEVER GREATER THAN 101.0 F  *CHILLS WITH OR WITHOUT FEVER  NAUSEA AND VOMITING THAT IS NOT CONTROLLED WITH YOUR NAUSEA MEDICATION  *UNUSUAL SHORTNESS OF BREATH  *UNUSUAL BRUISING OR BLEEDING  TENDERNESS IN MOUTH AND THROAT WITH OR WITHOUT PRESENCE OF ULCERS  *URINARY PROBLEMS  *BOWEL PROBLEMS  UNUSUAL RASH Items with * indicate a potential emergency and should be followed up as soon as possible. If you have an emergency after office hours please contact your primary care physician or go to the nearest emergency department.  Please call the clinic during office hours if you have any questions or concerns.   You may also contact the Patient Navigator at [phone] should you have any questions or need assistance in obtaining follow up care.      Resources For Cancer Patients and their Caregivers ? American Cancer Society: Can assist with transportation, wigs, general needs, runs Look Good Feel Better.        1-888-227-6333 ? Cancer Care: Provides financial assistance, online support groups, medication/co-pay assistance.  1-800-813-HOPE (4673) ? [facility] Assists [place] cancer  patients and their families through emotional , educational and financial support.  336-427-4357 ? [place] DSS Where to apply for food stamps, Medicaid and utility assistance. 336-342-1394 ? [facility]: Transportation to medical appointments. 336-347-2287 ? Social Security Administration: May apply for disability if have a Stage IV cancer. 336-342-7796 1-800-772-1213 ? [place] Aging, Disability and Transit Services: Assists with nutrition, care and transit needs. 336-349-2343         

## 2018-07-22 ENCOUNTER — Ambulatory Visit (INDEPENDENT_AMBULATORY_CARE_PROVIDER_SITE_OTHER): Payer: 59 | Admitting: Otolaryngology

## 2018-07-22 DIAGNOSIS — R040 Epistaxis: Secondary | ICD-10-CM | POA: Diagnosis not present

## 2018-07-22 DIAGNOSIS — R42 Dizziness and giddiness: Secondary | ICD-10-CM

## 2018-07-22 DIAGNOSIS — H9042 Sensorineural hearing loss, unilateral, left ear, with unrestricted hearing on the contralateral side: Secondary | ICD-10-CM

## 2018-07-26 ENCOUNTER — Ambulatory Visit (HOSPITAL_COMMUNITY)
Admission: RE | Admit: 2018-07-26 | Discharge: 2018-07-26 | Disposition: A | Discharge status: Home / Self Care | Payer: 59 | Source: Ambulatory Visit | Attending: Cardiology | Admitting: Cardiology

## 2018-07-26 ENCOUNTER — Ambulatory Visit (HOSPITAL_BASED_OUTPATIENT_CLINIC_OR_DEPARTMENT_OTHER)
Admission: RE | Admit: 2018-07-26 | Discharge: 2018-07-26 | Disposition: A | Discharge status: Home / Self Care | Payer: 59 | Source: Ambulatory Visit | Attending: Cardiology | Admitting: Cardiology

## 2018-07-26 VITALS — BP 126/61 | HR 71 | Wt 243.8 lb

## 2018-07-26 DIAGNOSIS — E781 Pure hyperglyceridemia: Secondary | ICD-10-CM | POA: Diagnosis not present

## 2018-07-26 DIAGNOSIS — I5030 Unspecified diastolic (congestive) heart failure: Secondary | ICD-10-CM | POA: Diagnosis not present

## 2018-07-26 DIAGNOSIS — Z801 Family history of malignant neoplasm of trachea, bronchus and lung: Secondary | ICD-10-CM | POA: Diagnosis not present

## 2018-07-26 DIAGNOSIS — Z79899 Other long term (current) drug therapy: Secondary | ICD-10-CM | POA: Diagnosis not present

## 2018-07-26 DIAGNOSIS — C50911 Malignant neoplasm of unspecified site of right female breast: Secondary | ICD-10-CM | POA: Diagnosis not present

## 2018-07-26 DIAGNOSIS — C50211 Malignant neoplasm of upper-inner quadrant of right female breast: Secondary | ICD-10-CM

## 2018-07-26 DIAGNOSIS — Z170 Estrogen receptor positive status [ER+]: Secondary | ICD-10-CM | POA: Diagnosis not present

## 2018-07-26 DIAGNOSIS — E7800 Pure hypercholesterolemia, unspecified: Secondary | ICD-10-CM

## 2018-07-26 DIAGNOSIS — Z7984 Long term (current) use of oral hypoglycemic drugs: Secondary | ICD-10-CM | POA: Insufficient documentation

## 2018-07-26 DIAGNOSIS — Z923 Personal history of irradiation: Secondary | ICD-10-CM | POA: Diagnosis not present

## 2018-07-26 DIAGNOSIS — I10 Essential (primary) hypertension: Secondary | ICD-10-CM | POA: Insufficient documentation

## 2018-07-26 DIAGNOSIS — I350 Nonrheumatic aortic (valve) stenosis: Secondary | ICD-10-CM | POA: Insufficient documentation

## 2018-07-26 LAB — LIPID PANEL
Cholesterol: 212 mg/dL — ABNORMAL HIGH (ref 0–200)
HDL: 30 mg/dL — AB (ref >40)
LDL CALC: 125 mg/dL — AB (ref 0–99)
Total CHOL/HDL Ratio: 7.1 RATIO
Triglycerides: 284 mg/dL — ABNORMAL HIGH (ref <150)
VLDL: 57 mg/dL — ABNORMAL HIGH (ref 0–40)

## 2018-07-26 NOTE — Progress Notes (Signed)
Oncology: Dr. Katragadda  56 yo with history of HTN and breast cancer presents for cardio-oncology followup.  Patient was diagnosed with breast cancer on the right in 7/18.  ER+/PR+/HER2+.  She had lumpectomy in 2/19 and is currently getting Kadcyla to continue until 1/20. She has finished radiation.     She is stable symptomatically.  No exertional dyspnea or chest pain.  Some generalized fatigue.  No lightheadedness.    PMH: 1. HTN 2. Hypertriglyceridemia 3. Breast cancer: Diagnosed 7/18, on right.  ER+/PR+/HER2+.  She had lumpectomy in 2/19 and has completed radiation.  She is on Kadcyla to 9/19.    - Echo (7/18): EF 65-70%, GLS -19.8%, mild AS with mean gradient 15 mmHg, normal RV size and systolic function.  - Echo (10/18): EF 65-70%, mild LVH, GLS -19.6%, moderate AS with mean gradient 26, AVA 1.3 cm^2.  - Echo (4/19): EF 60-65%, GLS -18.6%, mild AS mean gradient 10 mmHg.  - Echo (7/19): EF 60-65%, GLS -15.2%, normal RV size and systolic function, moderate AS with mean gradient 20 and AVA 1.2 cm^2.  - Echo (10/19): EF 60-65%, mild LVH, moderate AS, GLS -16.5%.  4. Aortic stenosis: Moderate on 7/19 echo.   Social History   Socioeconomic History  . Marital status: Married    Spouse name: Not on file  . Number of children: Not on file  . Years of education: Not on file  . Highest education level: Not on file  Occupational History  . Not on file  Social Needs  . Financial resource strain: Not on file  . Food insecurity:    Worry: Not on file    Inability: Not on file  . Transportation needs:    Medical: Not on file    Non-medical: Not on file  Tobacco Use  . Smoking status: Never Smoker  . Smokeless tobacco: Never Used  Substance and Sexual Activity  . Alcohol use: Yes    Comment: 2 x year  . Drug use: No  . Sexual activity: Yes    Birth control/protection: Surgical  Lifestyle  . Physical activity:    Days per week: Not on file    Minutes per session: Not on file  .  Stress: Not on file  Relationships  . Social connections:    Talks on phone: Not on file    Gets together: Not on file    Attends religious service: Not on file    Active member of club or organization: Not on file    Attends meetings of clubs or organizations: Not on file    Relationship status: Not on file  . Intimate partner violence:    Fear of current or ex partner: No    Emotionally abused: No    Physically abused: No    Forced sexual activity: No  Other Topics Concern  . Not on file  Social History Narrative  . Not on file   Family History  Problem Relation Age of Onset  . Lung cancer Maternal Grandmother   . Anesthesia problems Neg Hx   . Hypotension Neg Hx   . Malignant hyperthermia Neg Hx   . Pseudochol deficiency Neg Hx    ROS: All systems reviewed and negative except as per HPI.   Current Outpatient Medications  Medication Sig Dispense Refill  . acetaminophen (TYLENOL) 500 MG tablet Take 1,000 mg by mouth daily as needed for moderate pain or headache.    . Ado-Trastuzumab Emtansine (KADCYLA IV) Inject into the vein.    .   albuterol (PROVENTIL HFA;VENTOLIN HFA) 108 (90 BASE) MCG/ACT inhaler Inhale 1 puff into the lungs every 6 (six) hours as needed for wheezing or shortness of breath.     . anastrozole (ARIMIDEX) 1 MG tablet Take 1 tablet (1 mg total) by mouth daily. 30 tablet 1  . diphenhydrAMINE (BENADRYL) 25 mg capsule Take 25 mg by mouth at bedtime.     Mariane Baumgarten Sodium (STOOL SOFTENER LAXATIVE PO) Take 1 capsule by mouth daily as needed (For constipation.).    Marland Kitchen Enalapril Maleate (VASOTEC PO) Take by mouth.    . escitalopram (LEXAPRO) 20 MG tablet Take 10 mg by mouth at bedtime.     . fenofibrate 160 MG tablet Take 160 mg by mouth at bedtime.     . gabapentin (NEURONTIN) 600 MG tablet Take 600 mg by mouth 3 (three) times daily.    Vanessa Kick Ethyl (VASCEPA) 1 g CAPS Take 2 capsules (2 g total) by mouth 2 (two) times daily. 120 capsule 3  .  lidocaine-prilocaine (EMLA) cream Apply 1 application topically daily as needed (port access).     Marland Kitchen loperamide (IMODIUM A-D) 2 MG tablet Take 4 mg by mouth as needed for diarrhea or loose stools.    Marland Kitchen losartan (COZAAR) 50 MG tablet Take 25 mg by mouth at bedtime.     . meclizine (ANTIVERT) 25 MG tablet Take 1 tablet (25 mg total) by mouth 3 (three) times daily as needed for dizziness. 15 tablet 0  . metFORMIN (GLUCOPHAGE) 500 MG tablet Take 500 mg by mouth 2 (two) times daily with a meal.    . Multiple Vitamin (MULTIVITAMIN) tablet Take 1 tablet by mouth at bedtime.     Marland Kitchen omeprazole (PRILOSEC) 40 MG capsule Take 1 capsule (40 mg total) by mouth daily. (Patient taking differently: Take 40 mg by mouth at bedtime. ) 30 capsule 5  . Skin Protectants, Misc. (EUCERIN) cream Apply 1 application topically as needed for dry skin.     No current facility-administered medications for this encounter.    BP 126/61   Pulse 71   Wt 110.6 kg (243 lb 12.8 oz)   SpO2 97%   BMI 46.07 kg/m  General: NAD Neck: No JVD, no thyromegaly or thyroid nodule.  Lungs: Clear to auscultation bilaterally with normal respiratory effort. CV: Nondisplaced PMI.  Heart regular S1/S2, no K5/L9, 3/6 systolic murmur RUSB with muffled S2.  No peripheral edema.  No carotid bruit.  Normal pedal pulses.  Abdomen: Soft, nontender, no hepatosplenomegaly, no distention.  Skin: Intact without lesions or rashes.  Neurologic: Alert and oriented x 3.  Psych: Normal affect. Extremities: No clubbing or cyanosis.  HEENT: Normal.   Assessment/Plan: 1. Breast cancer: Patient is currently on Kadcyla, she says that this will continue until 1/20.  Echo today was reviewed, EF and strain stable.  Strain less negative but a different machine was used.  - Continue Kadcyla, repeat echo in 3 months.  This should be her last Kadcyla screening echo.  2. HTN: BP reasonably controlled.   3. Aortic stenosis: Moderate on today's echo, will need to follow  over time.   Followup in 3 months with repeat echo.   Loralie Champagne 07/26/2018

## 2018-07-26 NOTE — Patient Instructions (Signed)
Labs done today  Your physician recommends that you schedule a follow-up appointment in: 2 months with echocardiogram

## 2018-07-26 NOTE — Progress Notes (Signed)
  Echocardiogram 2D Echocardiogram has been performed.  Renee Harris 07/26/2018, 9:58 AM

## 2018-07-28 ENCOUNTER — Other Ambulatory Visit (HOSPITAL_COMMUNITY): Payer: 59

## 2018-07-29 ENCOUNTER — Inpatient Hospital Stay (HOSPITAL_COMMUNITY): Payer: 59 | Attending: Hematology

## 2018-07-29 ENCOUNTER — Inpatient Hospital Stay (HOSPITAL_COMMUNITY): Payer: 59

## 2018-07-29 ENCOUNTER — Inpatient Hospital Stay (HOSPITAL_BASED_OUTPATIENT_CLINIC_OR_DEPARTMENT_OTHER): Payer: 59 | Admitting: Internal Medicine

## 2018-07-29 ENCOUNTER — Other Ambulatory Visit: Payer: Self-pay

## 2018-07-29 ENCOUNTER — Encounter (HOSPITAL_COMMUNITY): Payer: Self-pay | Admitting: Internal Medicine

## 2018-07-29 VITALS — BP 121/56 | HR 72 | Temp 98.2°F | Resp 18 | Wt 246.9 lb

## 2018-07-29 VITALS — BP 124/66 | HR 63 | Temp 97.9°F | Resp 18

## 2018-07-29 DIAGNOSIS — K219 Gastro-esophageal reflux disease without esophagitis: Secondary | ICD-10-CM | POA: Insufficient documentation

## 2018-07-29 DIAGNOSIS — C50211 Malignant neoplasm of upper-inner quadrant of right female breast: Secondary | ICD-10-CM | POA: Diagnosis present

## 2018-07-29 DIAGNOSIS — Z9221 Personal history of antineoplastic chemotherapy: Secondary | ICD-10-CM | POA: Diagnosis not present

## 2018-07-29 DIAGNOSIS — E1142 Type 2 diabetes mellitus with diabetic polyneuropathy: Secondary | ICD-10-CM | POA: Insufficient documentation

## 2018-07-29 DIAGNOSIS — I10 Essential (primary) hypertension: Secondary | ICD-10-CM | POA: Insufficient documentation

## 2018-07-29 DIAGNOSIS — Z87442 Personal history of urinary calculi: Secondary | ICD-10-CM

## 2018-07-29 DIAGNOSIS — Z170 Estrogen receptor positive status [ER+]: Secondary | ICD-10-CM | POA: Diagnosis not present

## 2018-07-29 DIAGNOSIS — R011 Cardiac murmur, unspecified: Secondary | ICD-10-CM

## 2018-07-29 DIAGNOSIS — Z7984 Long term (current) use of oral hypoglycemic drugs: Secondary | ICD-10-CM | POA: Diagnosis not present

## 2018-07-29 DIAGNOSIS — Z5112 Encounter for antineoplastic immunotherapy: Secondary | ICD-10-CM | POA: Insufficient documentation

## 2018-07-29 DIAGNOSIS — Z79899 Other long term (current) drug therapy: Secondary | ICD-10-CM

## 2018-07-29 DIAGNOSIS — Z923 Personal history of irradiation: Secondary | ICD-10-CM

## 2018-07-29 DIAGNOSIS — G4730 Sleep apnea, unspecified: Secondary | ICD-10-CM

## 2018-07-29 DIAGNOSIS — Z79811 Long term (current) use of aromatase inhibitors: Secondary | ICD-10-CM | POA: Insufficient documentation

## 2018-07-29 DIAGNOSIS — R7989 Other specified abnormal findings of blood chemistry: Secondary | ICD-10-CM | POA: Diagnosis not present

## 2018-07-29 DIAGNOSIS — R634 Abnormal weight loss: Secondary | ICD-10-CM | POA: Insufficient documentation

## 2018-07-29 DIAGNOSIS — E785 Hyperlipidemia, unspecified: Secondary | ICD-10-CM | POA: Diagnosis not present

## 2018-07-29 LAB — CBC WITH DIFFERENTIAL/PLATELET
ABS IMMATURE GRANULOCYTES: 0.02 10*3/uL (ref 0.00–0.07)
BASOS PCT: 0 %
Basophils Absolute: 0 10*3/uL (ref 0.0–0.1)
EOS ABS: 0.2 10*3/uL (ref 0.0–0.5)
Eosinophils Relative: 4 %
HCT: 37.4 % (ref 36.0–46.0)
Hemoglobin: 11.5 g/dL — ABNORMAL LOW (ref 12.0–15.0)
IMMATURE GRANULOCYTES: 0 %
Lymphocytes Relative: 24 %
Lymphs Abs: 1.1 10*3/uL (ref 0.7–4.0)
MCH: 27.8 pg (ref 26.0–34.0)
MCHC: 30.7 g/dL (ref 30.0–36.0)
MCV: 90.6 fL (ref 80.0–100.0)
Monocytes Absolute: 0.4 10*3/uL (ref 0.1–1.0)
Monocytes Relative: 9 %
NEUTROS ABS: 3 10*3/uL (ref 1.7–7.7)
NRBC: 0 % (ref 0.0–0.2)
Neutrophils Relative %: 63 %
PLATELETS: 136 10*3/uL — AB (ref 150–400)
RBC: 4.13 MIL/uL (ref 3.87–5.11)
RDW: 15.6 % — AB (ref 11.5–15.5)
WBC: 4.7 10*3/uL (ref 4.0–10.5)

## 2018-07-29 LAB — COMPREHENSIVE METABOLIC PANEL
ALK PHOS: 90 U/L (ref 38–126)
ALT: 36 U/L (ref 0–44)
ANION GAP: 9 (ref 5–15)
AST: 52 U/L — ABNORMAL HIGH (ref 15–41)
Albumin: 3.8 g/dL (ref 3.5–5.0)
BUN: 18 mg/dL (ref 6–20)
CALCIUM: 10.9 mg/dL — AB (ref 8.9–10.3)
CO2: 26 mmol/L (ref 22–32)
CREATININE: 0.96 mg/dL (ref 0.44–1.00)
Chloride: 105 mmol/L (ref 98–111)
GFR calc Af Amer: >60 mL/min (ref >60)
Glucose, Bld: 142 mg/dL — ABNORMAL HIGH (ref 70–99)
Potassium: 4 mmol/L (ref 3.5–5.1)
SODIUM: 140 mmol/L (ref 135–145)
Total Bilirubin: 0.6 mg/dL (ref 0.3–1.2)
Total Protein: 8 g/dL (ref 6.5–8.1)

## 2018-07-29 LAB — LACTATE DEHYDROGENASE: LDH: 116 U/L (ref 98–192)

## 2018-07-29 MED ORDER — DIPHENHYDRAMINE HCL 25 MG PO CAPS: 50.0000 mg | ORAL_CAPSULE | Freq: Once | ORAL | Status: DC

## 2018-07-29 MED ORDER — SODIUM CHLORIDE 0.9 % IV SOLN
3.6000 mg/kg | Freq: Once | INTRAVENOUS | Status: AC
  Administered 2018-07-29: 420 mg via INTRAVENOUS
  Filled 2018-07-29: qty 16

## 2018-07-29 MED ORDER — ACETAMINOPHEN 325 MG PO TABS
ORAL_TABLET | ORAL | Status: AC
  Filled 2018-07-29: qty 2

## 2018-07-29 MED ORDER — HEPARIN SOD (PORK) LOCK FLUSH 100 UNIT/ML IV SOLN
500.0000 [IU] | Freq: Once | INTRAVENOUS | Status: AC | PRN
  Administered 2018-07-29: 500 [IU]

## 2018-07-29 MED ORDER — SODIUM CHLORIDE 0.9 % IV SOLN
Freq: Once | INTRAVENOUS | Status: AC
  Administered 2018-07-29: 11:00:00 via INTRAVENOUS

## 2018-07-29 MED ORDER — DIPHENHYDRAMINE HCL 25 MG PO CAPS
ORAL_CAPSULE | ORAL | Status: AC
  Filled 2018-07-29: qty 2

## 2018-07-29 MED ORDER — ACETAMINOPHEN 325 MG PO TABS: 650.0000 mg | ORAL_TABLET | Freq: Once | ORAL | Status: DC

## 2018-07-29 MED ORDER — LIDOCAINE-PRILOCAINE 2.5-2.5 % EX CREA: 1.0000 "application " | TOPICAL_CREAM | Freq: Every day | CUTANEOUS | 1 refills | Status: DC | PRN

## 2018-07-29 MED ORDER — SODIUM CHLORIDE 0.9% FLUSH
10.0000 mL | INTRAVENOUS | Status: DC | PRN
  Administered 2018-07-29: 10 mL
  Filled 2018-07-29: qty 10

## 2018-07-29 NOTE — Addendum Note (Signed)
Addended by: Vena Austria on: 07/29/2018 03:36 PM   Modules accepted: Orders

## 2018-07-29 NOTE — Patient Instructions (Signed)
[facility] Discharge Instructions for Patients Receiving Chemotherapy   Beginning January 23rd 2017 lab work for the Cancer Center will be done in the  Main lab at [place] on 1st floor. If you have a lab appointment with the Cancer Center please come in thru the  Main Entrance and check in at the main information desk   Today you received the following chemotherapy agents Kadcyla. Follow-up as scheduled. Call clinic for any questions or concerns  To help prevent nausea and vomiting after your treatment, we encourage you to take your nausea medication   If you develop nausea and vomiting, or diarrhea that is not controlled by your medication, call the clinic.  The clinic phone number is [phone]. Office hours are Monday-Friday 8:30am-5:00pm.  BELOW ARE SYMPTOMS THAT SHOULD BE REPORTED IMMEDIATELY:  *FEVER GREATER THAN 101.0 F  *CHILLS WITH OR WITHOUT FEVER  NAUSEA AND VOMITING THAT IS NOT CONTROLLED WITH YOUR NAUSEA MEDICATION  *UNUSUAL SHORTNESS OF BREATH  *UNUSUAL BRUISING OR BLEEDING  TENDERNESS IN MOUTH AND THROAT WITH OR WITHOUT PRESENCE OF ULCERS  *URINARY PROBLEMS  *BOWEL PROBLEMS  UNUSUAL RASH Items with * indicate a potential emergency and should be followed up as soon as possible. If you have an emergency after office hours please contact your primary care physician or go to the nearest emergency department.  Please call the clinic during office hours if you have any questions or concerns.   You may also contact the Patient Navigator at [phone] should you have any questions or need assistance in obtaining follow up care.      Resources For Cancer Patients and their Caregivers ? American Cancer Society: Can assist with transportation, wigs, general needs, runs Look Good Feel Better.        1-888-227-6333 ? Cancer Care: Provides financial assistance, online support groups, medication/co-pay assistance.  1-800-813-HOPE  (4673) ? [facility] Assists [place] cancer patients and their families through emotional , educational and financial support.  336-427-4357 ? [place] DSS Where to apply for food stamps, Medicaid and utility assistance. 336-342-1394 ? [facility]: Transportation to medical appointments. 336-347-2287 ? Social Security Administration: May apply for disability if have a Stage IV cancer. 336-342-7796 1-800-772-1213 ? [place] Aging, Disability and Transit Services: Assists with nutrition, care and transit needs. 336-349-2343         

## 2018-07-29 NOTE — Progress Notes (Signed)
Diagnosis Malignant neoplasm of upper-inner quadrant of right breast in female, estrogen receptor positive ([place]) - Plan: CBC with Differential/Platelet, Comprehensive metabolic panel, Lactate dehydrogenase, DISCONTINUED: 0.9 %  sodium chloride infusion, DISCONTINUED: sodium chloride flush (NS) 0.9 % injection 10 mL, DISCONTINUED: heparin lock flush 100 unit/mL, DISCONTINUED: acetaminophen (TYLENOL) tablet 650 mg, DISCONTINUED: diphenhydrAMINE (BENADRYL) capsule 50 mg, DISCONTINUED: ado-trastuzumab emtansine (KADCYLA) 420 mg in sodium chloride 0.9 % 250 mL chemo infusion  Staging Cancer Staging Malignant neoplasm of upper-inner quadrant of right breast in female, estrogen receptor positive ([place]) Staging form: Breast, AJCC 8th Edition - Clinical stage from 04/22/2017: Stage IB (cT2, cN0, cM0, G3, ER: Positive, PR: Positive, HER2: Positive) - Unsigned Staging comments: Staged at breast conference on 7.11.18   Assessment and Plan:  Malignant neoplasm of upper-inner quadrant of right breast in female, estrogen receptor positive ([place]) 1.  Clinical T2N0 HER-2 positive right breast cancer: -Biopsy of the right breast upper inner quadrant 1:00 mass, IDC, ER/PR positive, HER-2 positive, Ki-67 of 60% -Neoadjuvant chemotherapy with 6 cycles of TCHP from 05/29/2017 through 10/09/2017 -Right lumpectomy and lymph node biopsy on 12/09/2017, with lumpectomy #1 specimen with no malignancy, lumpectomy #2 specimen showing 2 cm IDC, grade 3, 1 out of 6 lymph nodes positive for malignancy.  (yPT1cyPN1a) - Based on KATHRINE trial, started on Kadcyla 3.6 mg/kg for 14 cycles started on 01/20/2018, tolerating it very well. - Patient could not tolerate letrozole resulting in chest pain and right lateral abdominal pain, went to the ER, CT abdomen and pelvis on 02/23/2018 shows stable anterior abdominal wall hernia, discontinued letrozole.  Completed radiation therapy. -Her mammogram on 05/04/2018  was BI-RADS Category 2. -Pt  started anastrozole daily on 05/05/2018 and is tolerating it very well.  She did not have any chest pains at this time.   -She is also tolerating Kadcyla very well.  Labs done 07/29/2018 reviewed and showed WBC 4.7 HB 11.5 plts 136,000.  Chemistries WNL with K+ 4, Cr 0.96 and AST of 53 and ALT of 36.  ECHO done 07/26/2018 shows EF of 60-65%.  Pt will proceed with Kadcyla.   She will follow-up with Dr. Worthy Keeler in 3 weeks.  Pt requesting RX for EMLA cream.  Rx sent.     2.  Elevated LFTs.  AST mildly elevated at 52.  ALT 36.  Pt reports she was started on Vascepa by Cardiology for cholesterol.  Pt will have repeat labs in 3 weeks with follow-up with Dr. Worthy Keeler.    3.  Peripheral neuropathy.  Pt is on gabapentin 600 mg 3 times a day, started by her PMD.  Follow-up with PCP as directed.    4.  Mild hypercalcemia.  Pt has mild hypercalcemia dating back to 2012.  Her vitamin D level was slightly low.  PTH was in the normal range at 35.  Calcium level 10.9 on labs done 1-/17/2019.  Will repeat chemistries on RTC in 3 weeks.  Check ionized calcium at that time.    25 minutes spent with more than 50% spent in counseling and coordination of care.    Current Status:  Pt is seen today for follow-up.  She is here for evaluation prior to Kadcyla.     Malignant neoplasm of upper-inner quadrant of right breast in female, estrogen receptor positive ([place])   04/13/2017 Initial Diagnosis    Screening detected right breast mass with calcifications 2.1 cm in size and 3.4 cm in size. Calcifications were fibroadenoma. Right breast biopsy upper inner quadrant 1:00  mass: IDC with DCIS with necrosis, grade 3, ER 100%, PR 70%, Ki-67 60%, HER-2 positive ratio 2.34, T2 N1 stage IB (New AJCC)    04/25/2017 Breast MRI    Right breast upper inner quadrant 2.2 x 1.7 x 2.7 cm mass, abnormal area of clumped non-mass enhancement in the right lateral breast 3.2 x 1.9 x 1 cm, too abnormal lymph nodes right axilla 4.3 and 1.7 cm  (biopsy-proven breast cancer)     05/08/2017 Procedure    Right axilla lymph node biopsy: Metastatic carcinoma    05/13/2017 Procedure    Right breast biopsy retroareolar region: Intraductal papilloma    01/13/2018 -  Chemotherapy    The patient had ado-trastuzumab emtansine (KADCYLA) 420 mg in sodium chloride 0.9 % 250 mL chemo infusion, 3.6 mg/kg = 420 mg, Intravenous, Once, 10 of 14 cycles Administration: 420 mg (01/20/2018), 420 mg (02/10/2018), 420 mg (04/14/2018), 420 mg (03/03/2018), 420 mg (03/24/2018), 420 mg (05/05/2018), 420 mg (05/26/2018), 420 mg (06/16/2018), 420 mg (07/08/2018)  for chemotherapy treatment.       Problem List Patient Active Problem List   Diagnosis Date Noted  . Weight loss [R63.4] 06/13/2017  . Malignant neoplasm of upper-inner quadrant of right breast in female, estrogen receptor positive ([place]) [C50.211, Z17.0] 04/16/2017    Past Medical History Past Medical History:  Diagnosis Date  . Asthma   . Blood transfusion    as child  . Breast cancer ([place]) 2018  . Cancer ([place])   . Diabetes ([place])    type 2   . GERD (gastroesophageal reflux disease)   . Headache   . Heart murmur    d/t aortic stenosis   . History of kidney stones   . Hyperlipidemia   . Hypertension   . Normal echocardiogram   . Personal history of chemotherapy 2018  . Personal history of radiation therapy 2018  . Sleep apnea     Past Surgical History Past Surgical History:  Procedure Laterality Date  . ABDOMINAL HYSTERECTOMY  2004  . BREAST LUMPECTOMY Right 2018  . BREAST LUMPECTOMY WITH RADIOACTIVE SEED AND SENTINEL LYMPH NODE BIOPSY Right 12/09/2017   Procedure: RIGHT BREAST LUMPECTOMY WITH RADIOACTIVE SEED AND SENTINEL LYMPH NODE BIOPSY AND RADIOACTIVE SEED TARGETED RIGHT AXILLARY LYMPH NODE EXCISION;  Surgeon: Alphonsa Overall, MD;  Location: [facility];  Service: General;  Laterality: Right;  . CHOLECYSTECTOMY  05/30/2011   Procedure: LAPAROSCOPIC CHOLECYSTECTOMY;  Surgeon: Donato Heinz;   Location: [facility];  Service: General;  Laterality: N/A;  . 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 KIDNEY STONE SURGERY     2017 2-23  . PORTACATH PLACEMENT N/A 05/25/2017   Procedure: INSERTION PORT-A-CATH;  Surgeon: Alphonsa Overall, MD;  Location: [facility];  Service: General;  Laterality: N/A;    Family History Family History  Problem Relation Age of Onset  . Lung cancer Maternal Grandmother   . Anesthesia problems Neg Hx   . Hypotension Neg Hx   . Malignant hyperthermia Neg Hx   . Pseudochol deficiency Neg Hx      Social History  reports that she has never smoked. She has never used smokeless tobacco. She reports that she drinks alcohol. She reports that she does not use drugs.  Medications  Current Outpatient Medications:  .  acetaminophen (TYLENOL) 500 MG tablet, Take 1,000 mg by mouth daily as needed for moderate pain or headache., Disp: , Rfl:  .  Ado-Trastuzumab Emtansine (KADCYLA IV),  Inject into the vein., Disp: , Rfl:  .  albuterol (PROVENTIL HFA;VENTOLIN HFA) 108 (90 BASE) MCG/ACT inhaler, Inhale 1 puff into the lungs every 6 (six) hours as needed for wheezing or shortness of breath. , Disp: , Rfl:  .  anastrozole (ARIMIDEX) 1 MG tablet, Take 1 tablet (1 mg total) by mouth daily., Disp: 30 tablet, Rfl: 1 .  diphenhydrAMINE (BENADRYL) 25 mg capsule, Take 25 mg by mouth at bedtime. , Disp: , Rfl:  .  Docusate Sodium (STOOL SOFTENER LAXATIVE PO), Take 1 capsule by mouth daily as needed (For constipation.)., Disp: , Rfl:  .  Enalapril Maleate (VASOTEC PO), Take by mouth., Disp: , Rfl:  .  escitalopram (LEXAPRO) 20 MG tablet, Take 10 mg by mouth at bedtime. , Disp: , Rfl:  .  fenofibrate 160 MG tablet, Take 160 mg by mouth at bedtime. , Disp: , Rfl:  .  gabapentin (NEURONTIN) 600 MG tablet, Take 600 mg by mouth 3 (three) times daily., Disp: , Rfl:  .  Icosapent Ethyl (VASCEPA) 1 g CAPS, Take 2 capsules (2 g total) by mouth 2 (two) times daily., Disp: 120  capsule, Rfl: 3 .  lidocaine-prilocaine (EMLA) cream, Apply 1 application topically daily as needed (port access). , Disp: , Rfl:  .  loperamide (IMODIUM A-D) 2 MG tablet, Take 4 mg by mouth as needed for diarrhea or loose stools., Disp: , Rfl:  .  losartan (COZAAR) 50 MG tablet, Take 25 mg by mouth at bedtime. , Disp: , Rfl:  .  meclizine (ANTIVERT) 25 MG tablet, Take 1 tablet (25 mg total) by mouth 3 (three) times daily as needed for dizziness., Disp: 15 tablet, Rfl: 0 .  metFORMIN (GLUCOPHAGE) 500 MG tablet, Take 500 mg by mouth 2 (two) times daily with a meal., Disp: , Rfl:  .  Multiple Vitamin (MULTIVITAMIN) tablet, Take 1 tablet by mouth at bedtime. , Disp: , Rfl:  .  omeprazole (PRILOSEC) 40 MG capsule, Take 1 capsule (40 mg total) by mouth daily. (Patient taking differently: Take 40 mg by mouth at bedtime. ), Disp: 30 capsule, Rfl: 5 .  Skin Protectants, Misc. (EUCERIN) cream, Apply 1 application topically as needed for dry skin., Disp: , Rfl:  No current facility-administered medications for this visit.   Facility-Administered Medications Ordered in Other Visits:  .  0.9 %  sodium chloride infusion, , Intravenous, Once, Scott Fix, Mathis Dad, MD .  acetaminophen (TYLENOL) tablet 650 mg, 650 mg, Oral, Once, Joaquina Nissen, Mathis Dad, MD .  ado-trastuzumab emtansine (KADCYLA) 420 mg in sodium chloride 0.9 % 250 mL chemo infusion, 3.6 mg/kg (Treatment Plan Recorded), Intravenous, Once, Perris Tripathi, MD .  diphenhydrAMINE (BENADRYL) capsule 50 mg, 50 mg, Oral, Once, Andrena Margerum, MD .  heparin lock flush 100 unit/mL, 500 Units, Intracatheter, Once PRN, Venessa Wickham, MD .  sodium chloride flush (NS) 0.9 % injection 10 mL, 10 mL, Intracatheter, PRN, Krue Peterka, MD, 10 mL at 07/29/18 1050  Allergies Latex; Pyridostigmine bromide; and Statins  Review of Systems Review of Systems - Oncology ROS negative other than neuropathy   Physical Exam  Vitals Wt Readings from Last 3 Encounters:  07/29/18 246 lb 14.4  oz (112 kg)  07/26/18 243 lb 12.8 oz (110.6 kg)  07/08/18 246 lb 9.6 oz (111.9 kg)   Temp Readings from Last 3 Encounters:  07/29/18 98.2 F (36.8 C) (Oral)  07/08/18 98.3 F (36.8 C) (Oral)  06/16/18 98.1 F (36.7 C) (Oral)   BP Readings from Last 3 Encounters:  07/29/18 (!) 121/56  07/26/18 126/61  07/08/18 124/73   Pulse Readings from Last 3 Encounters:  07/29/18 72  07/26/18 71  07/08/18 85   Constitutional: Well-developed, well-nourished, and in no distress.   HENT: Head: Normocephalic and atraumatic.  Mouth/Throat: No oropharyngeal exudate. Mucosa moist. Eyes: Pupils are equal, round, and reactive to light. Conjunctivae are normal. No scleral icterus.  Neck: Normal range of motion. Neck supple. No JVD present.  Cardiovascular: Normal rate, regular rhythm and normal heart sounds.  Exam reveals no gallop and no friction rub.   No murmur heard. Pulmonary/Chest: Effort normal and breath sounds normal. No respiratory distress. No wheezes.No rales.  Abdominal: Soft. Bowel sounds are normal. No distension. There is no tenderness. There is no guarding.  Musculoskeletal: No edema or tenderness.  Lymphadenopathy: No cervical, axillary or supraclavicular adenopathy.  Neurological: Alert and oriented to person, place, and time. No cranial nerve deficit.  Skin: Skin is warm and dry. No rash noted. No erythema. No pallor.  Psychiatric: Affect and judgment normal.   Labs Appointment on 07/29/2018  Component Date Value Ref Range Status  . WBC 07/29/2018 4.7  4.0 - 10.5 K/uL Final  . RBC 07/29/2018 4.13  3.87 - 5.11 MIL/uL Final  . Hemoglobin 07/29/2018 11.5* 12.0 - 15.0 g/dL Final  . HCT 07/29/2018 37.4  36.0 - 46.0 % Final  . MCV 07/29/2018 90.6  80.0 - 100.0 fL Final  . MCH 07/29/2018 27.8  26.0 - 34.0 pg Final  . MCHC 07/29/2018 30.7  30.0 - 36.0 g/dL Final  . RDW 07/29/2018 15.6* 11.5 - 15.5 % Final  . Platelets 07/29/2018 136* 150 - 400 K/uL Final   Comment: Immature  Platelet Fraction may be clinically indicated, consider ordering this additional test [id number]   . nRBC 07/29/2018 0.0  0.0 - 0.2 % Final  . Neutrophils Relative % 07/29/2018 63  % Final  . Neutro Abs 07/29/2018 3.0  1.7 - 7.7 K/uL Final  . Lymphocytes Relative 07/29/2018 24  % Final  . Lymphs Abs 07/29/2018 1.1  0.7 - 4.0 K/uL Final  . Monocytes Relative 07/29/2018 9  % Final  . Monocytes Absolute 07/29/2018 0.4  0.1 - 1.0 K/uL Final  . Eosinophils Relative 07/29/2018 4  % Final  . Eosinophils Absolute 07/29/2018 0.2  0.0 - 0.5 K/uL Final  . Basophils Relative 07/29/2018 0  % Final  . Basophils Absolute 07/29/2018 0.0  0.0 - 0.1 K/uL Final  . Immature Granulocytes 07/29/2018 0  % Final  . Abs Immature Granulocytes 07/29/2018 0.02  0.00 - 0.07 K/uL Final   Performed at [facility], [address] [place], [place] [address]  . Sodium 07/29/2018 140  135 - 145 mmol/L Final  . Potassium 07/29/2018 4.0  3.5 - 5.1 mmol/L Final  . Chloride 07/29/2018 105  98 - 111 mmol/L Final  . CO2 07/29/2018 26  22 - 32 mmol/L Final  . Glucose, Bld 07/29/2018 142* 70 - 99 mg/dL Final  . BUN 07/29/2018 18  6 - 20 mg/dL Final  . Creatinine, Ser 07/29/2018 0.96  0.44 - 1.00 mg/dL Final  . Calcium 07/29/2018 10.9* 8.9 - 10.3 mg/dL Final  . Total Protein 07/29/2018 8.0  6.5 - 8.1 g/dL Final  . Albumin 07/29/2018 3.8  3.5 - 5.0 g/dL Final  . AST 07/29/2018 52* 15 - 41 U/L Final  . ALT 07/29/2018 36  0 - 44 U/L Final  . Alkaline Phosphatase 07/29/2018 90  38 - 126 U/L Final  .  Total Bilirubin 07/29/2018 0.6  0.3 - 1.2 mg/dL Final  . GFR calc non Af Amer 07/29/2018 >60  >60 mL/min Final  . GFR calc Af Amer 07/29/2018 >60  >60 mL/min Final   Comment: (NOTE) The eGFR has been calculated using the CKD EPI equation. This calculation has not been validated in all clinical situations. eGFR's persistently <60 mL/min signify possible Chronic Kidney Disease.   Georgiann Hahn gap 07/29/2018 9  5 - 15 Final   Performed  at [facility], [address], [place], [place] [address]  . LDH 07/29/2018 116  98 - 192 U/L Final   Performed at [facility], [address] [place], [place] [address]     Pathology Orders Placed This Encounter  Procedures  . CBC with Differential/Platelet    Standing Status:   Future    Standing Expiration Date:   07/30/2019  . Comprehensive metabolic panel    Standing Status:   Future    Standing Expiration Date:   07/30/2019  . Lactate dehydrogenase    Standing Status:   Future    Standing Expiration Date:   07/30/2019       Zoila Shutter MD

## 2018-07-29 NOTE — Progress Notes (Signed)
1045 labs reviewed and pt seen by Dr. Walden Field who approved pt for treatment today. Echo reviewed from 10/14. EF 60-65%. Pt took Tylenol and Benadryl prior to coming today.   Renee Harris tolertaed Kadcyla without incident or complaint. VSS upon completion of treatment. Discharged self ambulatory in satisfactory condition.

## 2018-08-18 ENCOUNTER — Inpatient Hospital Stay (HOSPITAL_BASED_OUTPATIENT_CLINIC_OR_DEPARTMENT_OTHER): Payer: 59 | Admitting: Hematology

## 2018-08-18 ENCOUNTER — Encounter (HOSPITAL_COMMUNITY): Payer: Self-pay | Admitting: Hematology

## 2018-08-18 ENCOUNTER — Inpatient Hospital Stay (HOSPITAL_COMMUNITY): Payer: 59 | Attending: Hematology

## 2018-08-18 ENCOUNTER — Other Ambulatory Visit: Payer: Self-pay

## 2018-08-18 VITALS — BP 106/59 | HR 70 | Temp 98.4°F | Resp 18 | Wt 244.5 lb

## 2018-08-18 DIAGNOSIS — Z923 Personal history of irradiation: Secondary | ICD-10-CM | POA: Diagnosis not present

## 2018-08-18 DIAGNOSIS — I10 Essential (primary) hypertension: Secondary | ICD-10-CM | POA: Diagnosis not present

## 2018-08-18 DIAGNOSIS — Z9221 Personal history of antineoplastic chemotherapy: Secondary | ICD-10-CM | POA: Diagnosis not present

## 2018-08-18 DIAGNOSIS — Z170 Estrogen receptor positive status [ER+]: Secondary | ICD-10-CM

## 2018-08-18 DIAGNOSIS — Z79899 Other long term (current) drug therapy: Secondary | ICD-10-CM

## 2018-08-18 DIAGNOSIS — C50211 Malignant neoplasm of upper-inner quadrant of right female breast: Secondary | ICD-10-CM | POA: Diagnosis not present

## 2018-08-18 LAB — CBC WITH DIFFERENTIAL/PLATELET
Abs Immature Granulocytes: 0.02 10*3/uL (ref 0.00–0.07)
BASOS ABS: 0 10*3/uL (ref 0.0–0.1)
Basophils Relative: 1 %
EOS PCT: 4 %
Eosinophils Absolute: 0.2 10*3/uL (ref 0.0–0.5)
HEMATOCRIT: 36.1 % (ref 36.0–46.0)
Hemoglobin: 11.1 g/dL — ABNORMAL LOW (ref 12.0–15.0)
Immature Granulocytes: 1 %
Lymphocytes Relative: 24 %
Lymphs Abs: 1 10*3/uL (ref 0.7–4.0)
MCH: 27.8 pg (ref 26.0–34.0)
MCHC: 30.7 g/dL (ref 30.0–36.0)
MCV: 90.5 fL (ref 80.0–100.0)
Monocytes Absolute: 0.5 10*3/uL (ref 0.1–1.0)
Monocytes Relative: 11 %
NRBC: 0 % (ref 0.0–0.2)
Neutro Abs: 2.6 10*3/uL (ref 1.7–7.7)
Neutrophils Relative %: 59 %
Platelets: 130 10*3/uL — ABNORMAL LOW (ref 150–400)
RBC: 3.99 MIL/uL (ref 3.87–5.11)
RDW: 15.5 % (ref 11.5–15.5)
WBC: 4.2 10*3/uL (ref 4.0–10.5)

## 2018-08-18 LAB — COMPREHENSIVE METABOLIC PANEL
ALBUMIN: 3.8 g/dL (ref 3.5–5.0)
ALK PHOS: 89 U/L (ref 38–126)
ALT: 36 U/L (ref 0–44)
AST: 60 U/L — ABNORMAL HIGH (ref 15–41)
Anion gap: 8 (ref 5–15)
BUN: 18 mg/dL (ref 6–20)
CALCIUM: 10.7 mg/dL — AB (ref 8.9–10.3)
CO2: 28 mmol/L (ref 22–32)
CREATININE: 0.86 mg/dL (ref 0.44–1.00)
Chloride: 107 mmol/L (ref 98–111)
GFR calc non Af Amer: >60 mL/min (ref >60)
Glucose, Bld: 144 mg/dL — ABNORMAL HIGH (ref 70–99)
Potassium: 4.1 mmol/L (ref 3.5–5.1)
SODIUM: 143 mmol/L (ref 135–145)
Total Bilirubin: 0.7 mg/dL (ref 0.3–1.2)
Total Protein: 7.9 g/dL (ref 6.5–8.1)

## 2018-08-18 LAB — LACTATE DEHYDROGENASE: LDH: 113 U/L (ref 98–192)

## 2018-08-18 MED ORDER — ANASTROZOLE 1 MG PO TABS: 1.0000 mg | ORAL_TABLET | Freq: Every day | ORAL | 6 refills | Status: DC

## 2018-08-18 NOTE — Assessment & Plan Note (Signed)
1.  Clinical T2N0 HER-2 positive right breast cancer: -Biopsy of the right breast upper inner quadrant 1:00 mass, IDC, ER/PR positive, HER-2 positive, Ki-67 of 60% -Neoadjuvant chemotherapy with 6 cycles of TCHP from 05/29/2017 through 10/09/2017 -Right lumpectomy and lymph node biopsy on 12/09/2017, with lumpectomy #1 specimen with no malignancy, lumpectomy #2 specimen showing 2 cm IDC, grade 3, 1 out of 6 lymph nodes positive for malignancy.  (yPT1cyPN1a) - Based on KATHRINE trial, started on Kadcyla 3.6 mg/kg for 14 cycles started on 01/20/2018, tolerating it very well. -I have reviewed echocardiogram dated 04/27/2018 which shows ejection fraction of 60 to 65%. - Patient could not tolerate letrozole resulting in chest pain and right lateral abdominal pain, went to the ER, CT abdomen and pelvis on 02/23/2018 shows stable anterior abdominal wall hernia, discontinued letrozole.  Completed radiation therapy. -Her mammogram on 05/04/2018 reviewed by me was BI-RADS Category 2. - Anastrozole was started on 05/05/2018.  So far she has tolerated it very well. - I reviewed the results of the echocardiogram dated 07/26/2018 which shows EF of 60 to 65%. - She completed 10 cycles of Kadcyla on 07/29/2018.  I have reviewed her blood work.  She is experiencing mild fatigue. -She may proceed with her next cycle of Kadcyla today.  I will see her back in 6 weeks for follow-up. -We will order a baseline bone density test.  She was advised to take calcium and vitamin D.  2.  Peripheral neuropathy: -She is continuing gabapentin 600 mg 3 times a day, started by her PMD.  This has been stable in the feet.  She noticed some tingling in the left hand fingertips for last 1 week.  3.  Mild hypercalcemia: -She has mild hypercalcemia dating back to 2012.  Vitamin D was slightly low.  PTH was in the normal range at 35.

## 2018-08-18 NOTE — Progress Notes (Signed)
Renee Harris, [place] [address]   CLINIC:  Medical Oncology/Hematology  PCP:  Renee Sites, MD [address] [place] [address] [phone]   REASON FOR VISIT: Follow-up for right breast cancer, ER+/PR+/HER2+  CURRENT THERAPY: IV kadcyla every 3 weeks   BRIEF ONCOLOGIC HISTORY:    Malignant neoplasm of upper-inner quadrant of right breast in female, estrogen receptor positive ([place])   04/13/2017 Initial Diagnosis    Screening detected right breast mass with calcifications 2.1 cm in size and 3.4 cm in size. Calcifications were fibroadenoma. Right breast biopsy upper inner quadrant 1:00 mass: IDC with DCIS with necrosis, grade 3, ER 100%, PR 70%, Ki-67 60%, HER-2 positive ratio 2.34, T2 N1 stage IB (New AJCC)    04/25/2017 Breast MRI    Right breast upper inner quadrant 2.2 x 1.7 x 2.7 cm mass, abnormal area of clumped non-mass enhancement in the right lateral breast 3.2 x 1.9 x 1 cm, too abnormal lymph nodes right axilla 4.3 and 1.7 cm (biopsy-proven breast cancer)     05/08/2017 Procedure    Right axilla lymph node biopsy: Metastatic carcinoma    05/13/2017 Procedure    Right breast biopsy retroareolar region: Intraductal papilloma    01/13/2018 -  Chemotherapy    The patient had ado-trastuzumab emtansine (KADCYLA) 420 mg in sodium chloride 0.9 % 250 mL chemo infusion, 3.6 mg/kg = 420 mg, Intravenous, Once, 10 of 14 cycles Administration: 420 mg (01/20/2018), 420 mg (02/10/2018), 420 mg (04/14/2018), 420 mg (03/03/2018), 420 mg (03/24/2018), 420 mg (05/05/2018), 420 mg (05/26/2018), 420 mg (06/16/2018), 420 mg (07/08/2018), 420 mg (07/29/2018)  for chemotherapy treatment.       CANCER STAGING: Cancer Staging Malignant neoplasm of upper-inner quadrant of right breast in female, estrogen receptor positive ([place]) Staging form: Breast, AJCC 8th Edition - Clinical stage from 04/22/2017: Stage IB (cT2, cN0, cM0, G3, ER: Positive, PR: Positive, HER2:  Positive) - Unsigned    INTERVAL HISTORY:  Renee Harris 57 y.o. female returns for routine follow-up for right breast cancer. Patient is here today and tolerating treatment well. She does have some tiredness throughout the day. She has slight numbness in her hands and feet however it is stable at this time. She denies any nausea, vomiting, or diarrhea. Denies any fevers or recent infections. Denies any skin rashes or mouth sores. Denies any cough. She reports her appetite at 100% and she is maintaining her weight well. Her energy level is 75%.   REVIEW OF SYSTEMS:  Review of Systems  Constitutional: Positive for fatigue.  Neurological: Positive for numbness.  All other systems reviewed and are negative.    PAST MEDICAL/SURGICAL HISTORY:  Past Medical History:  Diagnosis Date  . Asthma   . Blood transfusion    as child  . Breast cancer ([place]) 2018  . Cancer ([place])   . Diabetes ([place])    type 2   . GERD (gastroesophageal reflux disease)   . Headache   . Heart murmur    d/t aortic stenosis   . History of kidney stones   . Hyperlipidemia   . Hypertension   . Normal echocardiogram   . Personal history of chemotherapy 2018  . Personal history of radiation therapy 2018  . Sleep apnea    Past Surgical History:  Procedure Laterality Date  . ABDOMINAL HYSTERECTOMY  2004  . BREAST LUMPECTOMY Right 2018  . BREAST LUMPECTOMY WITH RADIOACTIVE SEED AND SENTINEL LYMPH NODE BIOPSY Right 12/09/2017  Procedure: RIGHT BREAST LUMPECTOMY WITH RADIOACTIVE SEED AND SENTINEL LYMPH NODE BIOPSY AND RADIOACTIVE SEED TARGETED RIGHT AXILLARY LYMPH NODE EXCISION;  Surgeon: Alphonsa Overall, MD;  Location: [facility];  Service: General;  Laterality: Right;  . CHOLECYSTECTOMY  05/30/2011   Procedure: LAPAROSCOPIC CHOLECYSTECTOMY;  Surgeon: Donato Heinz;  Location: [facility];  Service: General;  Laterality: N/A;  . 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 KIDNEY STONE SURGERY     2017 2-23  .  PORTACATH PLACEMENT N/A 05/25/2017   Procedure: INSERTION PORT-A-CATH;  Surgeon: Alphonsa Overall, MD;  Location: [facility];  Service: General;  Laterality: N/A;     SOCIAL HISTORY:  Social History   Socioeconomic History  . Marital status: Married    Spouse name: Not on file  . Number of children: Not on file  . Years of education: Not on file  . Highest education level: Not on file  Occupational History  . Not on file  Social Needs  . Financial resource strain: Not on file  . Food insecurity:    Worry: Not on file    Inability: Not on file  . Transportation needs:    Medical: Not on file    Non-medical: Not on file  Tobacco Use  . Smoking status: Never Smoker  . Smokeless tobacco: Never Used  Substance and Sexual Activity  . Alcohol use: Yes    Comment: 2 x year  . Drug use: No  . Sexual activity: Yes    Birth control/protection: Surgical  Lifestyle  . Physical activity:    Days per week: Not on file    Minutes per session: Not on file  . Stress: Not on file  Relationships  . Social connections:    Talks on phone: Not on file    Gets together: Not on file    Attends religious service: Not on file    Active member of club or organization: Not on file    Attends meetings of clubs or organizations: Not on file    Relationship status: Not on file  . Intimate partner violence:    Fear of current or ex partner: No    Emotionally abused: No    Physically abused: No    Forced sexual activity: No  Other Topics Concern  . Not on file  Social History Narrative  . Not on file    FAMILY HISTORY:  Family History  Problem Relation Age of Onset  . Lung cancer Maternal Grandmother   . Anesthesia problems Neg Hx   . Hypotension Neg Hx   . Malignant hyperthermia Neg Hx   . Pseudochol deficiency Neg Hx     CURRENT MEDICATIONS:  Outpatient Encounter Medications as of 08/18/2018  Medication Sig Note  . acetaminophen (TYLENOL) 500 MG tablet Take 1,000 mg by mouth daily as  needed for moderate pain or headache.   . Ado-Trastuzumab Emtansine (KADCYLA IV) Inject into the vein. 5/14/2019[facility]ances [facility] at [facility].  Marland Kitchen albuterol (PROVENTIL HFA;VENTOLIN HFA) 108 (90 BASE) MCG/ACT inhaler Inhale 1 puff into the lungs every 6 (six) hours as needed for wheezing or shortness of breath.    . anastrozole (ARIMIDEX) 1 MG tablet Take 1 tablet (1 mg total) by mouth daily.   Mariane Baumgarten Sodium (STOOL SOFTENER LAXATIVE PO) Take 1 capsule by mouth daily as needed (For constipation.).   Marland Kitchen escitalopram (LEXAPRO) 20 MG tablet Take 10 mg by mouth at bedtime.    Marland Kitchen  fenofibrate 160 MG tablet Take 160 mg by mouth at bedtime.    . gabapentin (NEURONTIN) 600 MG tablet Take 600 mg by mouth 3 (three) times daily.   Vanessa Kick Ethyl (VASCEPA) 1 g CAPS Take 2 capsules (2 g total) by mouth 2 (two) times daily.   Marland Kitchen lidocaine-prilocaine (EMLA) cream Apply 1 application topically daily as needed (port access).   Marland Kitchen losartan (COZAAR) 50 MG tablet Take 25 mg by mouth at bedtime.    . metFORMIN (GLUCOPHAGE) 500 MG tablet Take 500 mg by mouth 2 (two) times daily with a meal.   . Multiple Vitamin (MULTIVITAMIN) tablet Take 1 tablet by mouth at bedtime.    Marland Kitchen omeprazole (PRILOSEC) 40 MG capsule Take 1 capsule (40 mg total) by mouth daily.   . Skin Protectants, Misc. (EUCERIN) cream Apply 1 application topically as needed for dry skin.   . [DISCONTINUED] anastrozole (ARIMIDEX) 1 MG tablet Take 1 tablet (1 mg total) by mouth daily.   . diphenhydrAMINE (BENADRYL) 25 mg capsule Take 25 mg by mouth at bedtime.    Marland Kitchen loperamide (IMODIUM A-D) 2 MG tablet Take 4 mg by mouth as needed for diarrhea or loose stools.   . meclizine (ANTIVERT) 25 MG tablet Take 1 tablet (25 mg total) by mouth 3 (three) times daily as needed for dizziness. (Patient not taking: Reported on 08/18/2018)   . [DISCONTINUED] Enalapril Maleate (VASOTEC PO) Take by mouth.    No facility-administered encounter medications on file as  of 08/18/2018.     ALLERGIES:  Allergies  Allergen Reactions  . Latex Itching  . Pyridostigmine Bromide Other (See Comments)    Makes muscles twitch  . Statins Nausea Only     PHYSICAL EXAM:  ECOG Performance status: 1  Vitals:   08/18/18 1051  BP: (!) 106/59  Pulse: 70  Resp: 18  Temp: 98.4 F (36.9 C)  SpO2: 97%   Filed Weights   08/18/18 1051  Weight: 244 lb 8 oz (110.9 kg)    Physical Exam  Constitutional: She is oriented to person, place, and time. She appears well-developed and well-nourished.  Abdominal: Soft.  Musculoskeletal: Normal range of motion.  Neurological: She is alert and oriented to person, place, and time.  Skin: Skin is warm and dry.  Psychiatric: She has a normal mood and affect. Her behavior is normal. Judgment and thought content normal.  Breast: No palpable masses, no skin changes or nipple discharge, no adenopathy. Right breast lumpectomy site is slightly thickened.  LABORATORY DATA:  I have reviewed the labs as listed.  CBC    Component Value Date/Time   WBC 4.2 08/18/2018 1013   RBC 3.99 08/18/2018 1013   HGB 11.1 (L) 08/18/2018 1013   HGB 13.8 04/22/2017 0831   HCT 36.1 08/18/2018 1013   HCT 41.9 04/22/2017 0831   PLT 130 (L) 08/18/2018 1013   PLT 184 04/22/2017 0831   MCV 90.5 08/18/2018 1013   MCV 85.7 04/22/2017 0831   MCH 27.8 08/18/2018 1013   MCHC 30.7 08/18/2018 1013   RDW 15.5 08/18/2018 1013   RDW 13.9 04/22/2017 0831   LYMPHSABS 1.0 08/18/2018 1013   LYMPHSABS 2.1 04/22/2017 0831   MONOABS 0.5 08/18/2018 1013   MONOABS 0.5 04/22/2017 0831   EOSABS 0.2 08/18/2018 1013   EOSABS 0.3 04/22/2017 0831   BASOSABS 0.0 08/18/2018 1013   BASOSABS 0.0 04/22/2017 0831   CMP Latest Ref Rng & Units 08/18/2018 07/29/2018 07/08/2018  Glucose 70 - 99 mg/dL 144(H)  142(H) 150(H)  BUN 6 - 20 mg/dL _0 Creatinine 0.44 - 1.00 mg/dL 0.86 0.96 0.86  Sodium 135 - 145 mmol/L 143 140 140  Potassium 3.5 - 5.1 mmol/L 4.1 4.0 3.8    Chloride 98 - 111 mmol/L 107 105 106  CO2 22 - 32 mmol/L _1 Calcium 8.9 - 10.3 mg/dL 10.7(H) 10.9(H) 10.4(H)  Total Protein 6.5 - 8.1 g/dL 7.9 8.0 7.3  Total Bilirubin 0.3 - 1.2 mg/dL 0.7 0.6 0.4  Alkaline Phos 38 - 126 U/L 89 90 80  AST 15 - 41 U/L 60(H) 52(H) 43(H)  ALT 0 - 44 U/L 36 36 31       DIAGNOSTIC IMAGING:  I have reviewed her mammogram dated 05/04/2018.      ASSESSMENT & PLAN:   Malignant neoplasm of upper-inner quadrant of right breast in female, estrogen receptor positive ([place]) 1.  Clinical T2N0 HER-2 positive right breast cancer: -Biopsy of the right breast upper inner quadrant 1:00 mass, IDC, ER/PR positive, HER-2 positive, Ki-67 of 60% -Neoadjuvant chemotherapy with 6 cycles of TCHP from 05/29/2017 through 10/09/2017 -Right lumpectomy and lymph node biopsy on 12/09/2017, with lumpectomy #1 specimen with no malignancy, lumpectomy #2 specimen showing 2 cm IDC, grade 3, 1 out of 6 lymph nodes positive for malignancy.  ([id number]) - Based on KATHRINE trial, started on Kadcyla 3.6 mg/kg for 14 cycles started on 01/20/2018, tolerating it very well. -I have reviewed echocardiogram dated 04/27/2018 which shows ejection fraction of 60 to 65%. - Patient could not tolerate letrozole resulting in chest pain and right lateral abdominal pain, went to the ER, CT abdomen and pelvis on 02/23/2018 shows stable anterior abdominal wall hernia, discontinued letrozole.  Completed radiation therapy. -Her mammogram on 05/04/2018 reviewed by me was BI-RADS Category 2. - Anastrozole was started on 05/05/2018.  So far she has tolerated it very well. - I reviewed the results of the echocardiogram dated 07/26/2018 which shows EF of 60 to 65%. - She completed 10 cycles of Kadcyla on 07/29/2018.  I have reviewed her blood work.  She is experiencing mild fatigue. -She may proceed with her next cycle of Kadcyla today.  I will see her back in 6 weeks for follow-up. -We will order a baseline bone  density test.  She was advised to take calcium and vitamin D.  2.  Peripheral neuropathy: -She is continuing gabapentin 600 mg 3 times a day, started by her PMD.  This has been stable in the feet.  She noticed some tingling in the left hand fingertips for last 1 week.  3.  Mild hypercalcemia: -She has mild hypercalcemia dating back to 2012.  Vitamin D was slightly low.  PTH was in the normal range at 35.       Orders placed this encounter:  Orders Placed This Encounter  Procedures  . DG Bone Density  . Lactate dehydrogenase  . CBC with Differential/Platelet  . Comprehensive metabolic panel      Derek Jack, MD [place] [phone]

## 2018-08-18 NOTE — Patient Instructions (Signed)
[place] at [facility] Discharge Instructions  Follow up with [place] in 6 weeks with labs  Continue your normal treatment schedule.   Thank you for choosing [place] at [facility] to provide your oncology and hematology care.  To afford each patient quality time with our provider, please arrive at least 15 minutes before your scheduled appointment time.   If you have a lab appointment with the [facility] please come in thru the  Main Entrance and check in at the main information desk  You need to re-schedule your appointment should you arrive 10 or more minutes late.  We strive to give you quality time with our providers, and arriving late affects you and other patients whose appointments are after yours.  Also, if you no show three or more times for appointments you may be dismissed from the clinic at the providers discretion.     Again, thank you for choosing [facility].  Our hope is that these requests will decrease the amount of time that you wait before being seen by our physicians.       _____________________________________________________________  Should you have questions after your visit to [facility], please contact our office at [phone] between the hours of 8:00 a.m. and 4:30 p.m.  Voicemails left after 4:00 p.m. will not be returned until the following business day.  For prescription refill requests, have your pharmacy contact our office and allow 72 hours.    Cancer Center Support Programs:   > Cancer Support Group  2nd Tuesday of the month 1pm-2pm, Journey Room

## 2018-08-19 ENCOUNTER — Encounter (HOSPITAL_COMMUNITY): Payer: Self-pay

## 2018-08-19 ENCOUNTER — Other Ambulatory Visit: Payer: Self-pay

## 2018-08-19 ENCOUNTER — Inpatient Hospital Stay (HOSPITAL_COMMUNITY): Payer: 59 | Attending: Hematology

## 2018-08-19 VITALS — BP 117/56 | HR 78 | Temp 98.2°F | Resp 18 | Wt 249.4 lb

## 2018-08-19 DIAGNOSIS — Z23 Encounter for immunization: Secondary | ICD-10-CM | POA: Insufficient documentation

## 2018-08-19 DIAGNOSIS — C50211 Malignant neoplasm of upper-inner quadrant of right female breast: Secondary | ICD-10-CM | POA: Insufficient documentation

## 2018-08-19 DIAGNOSIS — Z170 Estrogen receptor positive status [ER+]: Secondary | ICD-10-CM

## 2018-08-19 DIAGNOSIS — Z5112 Encounter for antineoplastic immunotherapy: Secondary | ICD-10-CM | POA: Diagnosis not present

## 2018-08-19 LAB — CALCIUM, IONIZED: CALCIUM, IONIZED, SERUM: 5.9 mg/dL — AB (ref 4.5–5.6)

## 2018-08-19 MED ORDER — INFLUENZA VAC SPLIT QUAD 0.5 ML IM SUSY
0.5000 mL | PREFILLED_SYRINGE | Freq: Once | INTRAMUSCULAR | Status: AC
  Administered 2018-08-19: 0.5 mL via INTRAMUSCULAR

## 2018-08-19 MED ORDER — SODIUM CHLORIDE 0.9% FLUSH
10.0000 mL | INTRAVENOUS | Status: DC | PRN
  Administered 2018-08-19: 10 mL
  Filled 2018-08-19: qty 10

## 2018-08-19 MED ORDER — INFLUENZA VAC SPLIT QUAD 0.5 ML IM SUSY
PREFILLED_SYRINGE | INTRAMUSCULAR | Status: AC
  Filled 2018-08-19: qty 0.5

## 2018-08-19 MED ORDER — SODIUM CHLORIDE 0.9 % IV SOLN
Freq: Once | INTRAVENOUS | Status: AC
  Administered 2018-08-19: 13:00:00 via INTRAVENOUS

## 2018-08-19 MED ORDER — ACETAMINOPHEN 325 MG PO TABS: 650.0000 mg | ORAL_TABLET | Freq: Once | ORAL | Status: DC

## 2018-08-19 MED ORDER — HEPARIN SOD (PORK) LOCK FLUSH 100 UNIT/ML IV SOLN
500.0000 [IU] | Freq: Once | INTRAVENOUS | Status: AC | PRN
  Administered 2018-08-19: 500 [IU]

## 2018-08-19 MED ORDER — DIPHENHYDRAMINE HCL 25 MG PO CAPS: 50.0000 mg | ORAL_CAPSULE | Freq: Once | ORAL | Status: DC

## 2018-08-19 MED ORDER — SODIUM CHLORIDE 0.9 % IV SOLN
3.6000 mg/kg | Freq: Once | INTRAVENOUS | Status: AC
  Administered 2018-08-19: 420 mg via INTRAVENOUS
  Filled 2018-08-19: qty 16

## 2018-08-19 NOTE — Progress Notes (Signed)
Pt presents today for Kadcycla Q 21 days. Pt has no complaints today. Labs reviewed and WNL. Vss. Pt states premeds were taken prior to arrival. Benadryl 50mg  PO and Tylenol 650 mg PO.   Treatment given today per MD orders. Tolerated infusion without adverse affects. Vital signs stable. No complaints at this time. Discharged from clinic ambulatory. F/U with [facility] as scheduled.

## 2018-08-19 NOTE — Patient Instructions (Signed)
[facility] Discharge Instructions for Patients Receiving Chemotherapy  Today you received the following chemotherapy agents   To help prevent nausea and vomiting after your treatment, we encourage you to take your nausea medication   If you develop nausea and vomiting that is not controlled by your nausea medication, call the clinic.   BELOW ARE SYMPTOMS THAT SHOULD BE REPORTED IMMEDIATELY:  *FEVER GREATER THAN 100.5 F  *CHILLS WITH OR WITHOUT FEVER  NAUSEA AND VOMITING THAT IS NOT CONTROLLED WITH YOUR NAUSEA MEDICATION  *UNUSUAL SHORTNESS OF BREATH  *UNUSUAL BRUISING OR BLEEDING  TENDERNESS IN MOUTH AND THROAT WITH OR WITHOUT PRESENCE OF ULCERS  *URINARY PROBLEMS  *BOWEL PROBLEMS  UNUSUAL RASH Items with * indicate a potential emergency and should be followed up as soon as possible.  Feel free to call the clinic should you have any questions or concerns. The clinic phone number is [phone].  Please show the CHEMO ALERT CARD at check-in to the Emergency Department and triage nurse.   

## 2018-08-23 ENCOUNTER — Telehealth (HOSPITAL_COMMUNITY): Payer: Self-pay | Admitting: Surgery

## 2018-08-23 NOTE — Telephone Encounter (Signed)
Pt had wanted to know if she should take calcium, since she has a hx of calcium based kidney stones.  Spoke with Dr. Raliegh Ip and he instructed pt to not take the calcium.  Left voicemail with pt and told her to call back if she had any more concerns.

## 2018-08-25 ENCOUNTER — Ambulatory Visit (HOSPITAL_COMMUNITY)
Admission: RE | Admit: 2018-08-25 | Discharge: 2018-08-25 | Disposition: A | Discharge status: Home / Self Care | Payer: 59 | Source: Ambulatory Visit | Attending: Nurse Practitioner | Admitting: Nurse Practitioner

## 2018-08-25 DIAGNOSIS — Z170 Estrogen receptor positive status [ER+]: Secondary | ICD-10-CM | POA: Diagnosis present

## 2018-08-25 DIAGNOSIS — C50211 Malignant neoplasm of upper-inner quadrant of right female breast: Secondary | ICD-10-CM | POA: Insufficient documentation

## 2018-08-25 DIAGNOSIS — M85851 Other specified disorders of bone density and structure, right thigh: Secondary | ICD-10-CM | POA: Diagnosis not present

## 2018-09-06 ENCOUNTER — Other Ambulatory Visit (HOSPITAL_COMMUNITY): Payer: Self-pay | Admitting: Cardiology

## 2018-09-13 ENCOUNTER — Inpatient Hospital Stay (HOSPITAL_COMMUNITY): Payer: 59

## 2018-09-13 ENCOUNTER — Other Ambulatory Visit: Payer: Self-pay

## 2018-09-13 ENCOUNTER — Encounter (HOSPITAL_COMMUNITY): Payer: Self-pay

## 2018-09-13 ENCOUNTER — Inpatient Hospital Stay (HOSPITAL_COMMUNITY): Payer: 59 | Attending: Hematology

## 2018-09-13 VITALS — BP 111/60 | HR 63 | Temp 97.5°F | Resp 18 | Wt 244.0 lb

## 2018-09-13 DIAGNOSIS — Z7984 Long term (current) use of oral hypoglycemic drugs: Secondary | ICD-10-CM | POA: Insufficient documentation

## 2018-09-13 DIAGNOSIS — G4730 Sleep apnea, unspecified: Secondary | ICD-10-CM | POA: Diagnosis not present

## 2018-09-13 DIAGNOSIS — E785 Hyperlipidemia, unspecified: Secondary | ICD-10-CM | POA: Insufficient documentation

## 2018-09-13 DIAGNOSIS — C50211 Malignant neoplasm of upper-inner quadrant of right female breast: Secondary | ICD-10-CM

## 2018-09-13 DIAGNOSIS — R948 Abnormal results of function studies of other organs and systems: Secondary | ICD-10-CM | POA: Insufficient documentation

## 2018-09-13 DIAGNOSIS — Z801 Family history of malignant neoplasm of trachea, bronchus and lung: Secondary | ICD-10-CM | POA: Diagnosis not present

## 2018-09-13 DIAGNOSIS — I10 Essential (primary) hypertension: Secondary | ICD-10-CM | POA: Diagnosis not present

## 2018-09-13 DIAGNOSIS — G629 Polyneuropathy, unspecified: Secondary | ICD-10-CM | POA: Diagnosis not present

## 2018-09-13 DIAGNOSIS — C773 Secondary and unspecified malignant neoplasm of axilla and upper limb lymph nodes: Secondary | ICD-10-CM | POA: Diagnosis not present

## 2018-09-13 DIAGNOSIS — Z5112 Encounter for antineoplastic immunotherapy: Secondary | ICD-10-CM | POA: Insufficient documentation

## 2018-09-13 DIAGNOSIS — Z923 Personal history of irradiation: Secondary | ICD-10-CM | POA: Diagnosis not present

## 2018-09-13 DIAGNOSIS — Z79811 Long term (current) use of aromatase inhibitors: Secondary | ICD-10-CM | POA: Insufficient documentation

## 2018-09-13 DIAGNOSIS — Z87442 Personal history of urinary calculi: Secondary | ICD-10-CM | POA: Insufficient documentation

## 2018-09-13 DIAGNOSIS — R011 Cardiac murmur, unspecified: Secondary | ICD-10-CM | POA: Diagnosis not present

## 2018-09-13 DIAGNOSIS — E119 Type 2 diabetes mellitus without complications: Secondary | ICD-10-CM | POA: Insufficient documentation

## 2018-09-13 DIAGNOSIS — Z170 Estrogen receptor positive status [ER+]: Secondary | ICD-10-CM | POA: Diagnosis not present

## 2018-09-13 DIAGNOSIS — K219 Gastro-esophageal reflux disease without esophagitis: Secondary | ICD-10-CM | POA: Diagnosis not present

## 2018-09-13 LAB — COMPREHENSIVE METABOLIC PANEL
ALBUMIN: 3.8 g/dL (ref 3.5–5.0)
ALT: 31 U/L (ref 0–44)
AST: 63 U/L — AB (ref 15–41)
Alkaline Phosphatase: 92 U/L (ref 38–126)
Anion gap: 8 (ref 5–15)
BUN: 20 mg/dL (ref 6–20)
CHLORIDE: 107 mmol/L (ref 98–111)
CO2: 24 mmol/L (ref 22–32)
CREATININE: 0.85 mg/dL (ref 0.44–1.00)
Calcium: 10.4 mg/dL — ABNORMAL HIGH (ref 8.9–10.3)
GFR calc Af Amer: >60 mL/min (ref >60)
GFR calc non Af Amer: >60 mL/min (ref >60)
Glucose, Bld: 261 mg/dL — ABNORMAL HIGH (ref 70–99)
POTASSIUM: 3.9 mmol/L (ref 3.5–5.1)
Sodium: 139 mmol/L (ref 135–145)
TOTAL PROTEIN: 7.7 g/dL (ref 6.5–8.1)
Total Bilirubin: 0.5 mg/dL (ref 0.3–1.2)

## 2018-09-13 LAB — CBC WITH DIFFERENTIAL/PLATELET
ABS IMMATURE GRANULOCYTES: 0.01 10*3/uL (ref 0.00–0.07)
BASOS PCT: 1 %
Basophils Absolute: 0 10*3/uL (ref 0.0–0.1)
EOS ABS: 0.2 10*3/uL (ref 0.0–0.5)
Eosinophils Relative: 5 %
HCT: 37.1 % (ref 36.0–46.0)
Hemoglobin: 11.3 g/dL — ABNORMAL LOW (ref 12.0–15.0)
IMMATURE GRANULOCYTES: 0 %
Lymphocytes Relative: 26 %
Lymphs Abs: 1.1 10*3/uL (ref 0.7–4.0)
MCH: 28.6 pg (ref 26.0–34.0)
MCHC: 30.5 g/dL (ref 30.0–36.0)
MCV: 93.9 fL (ref 80.0–100.0)
Monocytes Absolute: 0.3 10*3/uL (ref 0.1–1.0)
Monocytes Relative: 7 %
NEUTROS ABS: 2.6 10*3/uL (ref 1.7–7.7)
NEUTROS PCT: 61 %
NRBC: 0 % (ref 0.0–0.2)
PLATELETS: 124 10*3/uL — AB (ref 150–400)
RBC: 3.95 MIL/uL (ref 3.87–5.11)
RDW: 15.6 % — AB (ref 11.5–15.5)
WBC: 4.1 10*3/uL (ref 4.0–10.5)

## 2018-09-13 MED ORDER — HEPARIN SOD (PORK) LOCK FLUSH 100 UNIT/ML IV SOLN
500.0000 [IU] | Freq: Once | INTRAVENOUS | Status: AC | PRN
  Administered 2018-09-13: 500 [IU]

## 2018-09-13 MED ORDER — SODIUM CHLORIDE 0.9 % IV SOLN
Freq: Once | INTRAVENOUS | Status: AC
  Administered 2018-09-13: 14:00:00 via INTRAVENOUS

## 2018-09-13 MED ORDER — SODIUM CHLORIDE 0.9 % IV SOLN
3.6000 mg/kg | Freq: Once | INTRAVENOUS | Status: AC
  Administered 2018-09-13: 420 mg via INTRAVENOUS
  Filled 2018-09-13: qty 16

## 2018-09-13 MED ORDER — DIPHENHYDRAMINE HCL 25 MG PO CAPS: 50.0000 mg | ORAL_CAPSULE | Freq: Once | ORAL | Status: DC

## 2018-09-13 MED ORDER — ACETAMINOPHEN 325 MG PO TABS: 650.0000 mg | ORAL_TABLET | Freq: Once | ORAL | Status: DC

## 2018-09-13 NOTE — Progress Notes (Signed)
Tolerated infusion w/o adverse reaction.  Alert, in no distress.  VSS.  Discharged ambulatory.  

## 2018-10-04 ENCOUNTER — Ambulatory Visit (HOSPITAL_COMMUNITY): Payer: 59

## 2018-10-04 ENCOUNTER — Ambulatory Visit (HOSPITAL_COMMUNITY): Payer: 59 | Admitting: Hematology

## 2018-10-04 ENCOUNTER — Other Ambulatory Visit (HOSPITAL_COMMUNITY): Payer: 59

## 2018-10-05 ENCOUNTER — Other Ambulatory Visit (HOSPITAL_COMMUNITY): Payer: Self-pay | Admitting: Internal Medicine

## 2018-10-08 ENCOUNTER — Inpatient Hospital Stay (HOSPITAL_COMMUNITY): Payer: 59

## 2018-10-08 ENCOUNTER — Encounter (HOSPITAL_COMMUNITY): Payer: Self-pay | Admitting: Internal Medicine

## 2018-10-08 ENCOUNTER — Inpatient Hospital Stay (HOSPITAL_BASED_OUTPATIENT_CLINIC_OR_DEPARTMENT_OTHER): Payer: 59 | Admitting: Internal Medicine

## 2018-10-08 VITALS — BP 117/55 | HR 63 | Temp 97.8°F | Resp 18

## 2018-10-08 VITALS — BP 100/42 | HR 68 | Temp 97.9°F | Resp 18 | Wt 243.4 lb

## 2018-10-08 DIAGNOSIS — Z170 Estrogen receptor positive status [ER+]: Secondary | ICD-10-CM | POA: Diagnosis not present

## 2018-10-08 DIAGNOSIS — R948 Abnormal results of function studies of other organs and systems: Secondary | ICD-10-CM

## 2018-10-08 DIAGNOSIS — R011 Cardiac murmur, unspecified: Secondary | ICD-10-CM

## 2018-10-08 DIAGNOSIS — G629 Polyneuropathy, unspecified: Secondary | ICD-10-CM

## 2018-10-08 DIAGNOSIS — C773 Secondary and unspecified malignant neoplasm of axilla and upper limb lymph nodes: Secondary | ICD-10-CM | POA: Diagnosis not present

## 2018-10-08 DIAGNOSIS — Z7984 Long term (current) use of oral hypoglycemic drugs: Secondary | ICD-10-CM

## 2018-10-08 DIAGNOSIS — Z801 Family history of malignant neoplasm of trachea, bronchus and lung: Secondary | ICD-10-CM

## 2018-10-08 DIAGNOSIS — E119 Type 2 diabetes mellitus without complications: Secondary | ICD-10-CM

## 2018-10-08 DIAGNOSIS — K219 Gastro-esophageal reflux disease without esophagitis: Secondary | ICD-10-CM

## 2018-10-08 DIAGNOSIS — Z87442 Personal history of urinary calculi: Secondary | ICD-10-CM

## 2018-10-08 DIAGNOSIS — Z79811 Long term (current) use of aromatase inhibitors: Secondary | ICD-10-CM

## 2018-10-08 DIAGNOSIS — G4730 Sleep apnea, unspecified: Secondary | ICD-10-CM

## 2018-10-08 DIAGNOSIS — Z5112 Encounter for antineoplastic immunotherapy: Secondary | ICD-10-CM | POA: Diagnosis not present

## 2018-10-08 DIAGNOSIS — Z923 Personal history of irradiation: Secondary | ICD-10-CM

## 2018-10-08 DIAGNOSIS — C50211 Malignant neoplasm of upper-inner quadrant of right female breast: Secondary | ICD-10-CM

## 2018-10-08 DIAGNOSIS — I10 Essential (primary) hypertension: Secondary | ICD-10-CM

## 2018-10-08 DIAGNOSIS — E785 Hyperlipidemia, unspecified: Secondary | ICD-10-CM

## 2018-10-08 LAB — COMPREHENSIVE METABOLIC PANEL
ALT: 28 U/L (ref 0–44)
AST: 51 U/L — ABNORMAL HIGH (ref 15–41)
Albumin: 3.6 g/dL (ref 3.5–5.0)
Alkaline Phosphatase: 82 U/L (ref 38–126)
Anion gap: 7 (ref 5–15)
BUN: 18 mg/dL (ref 6–20)
CO2: 24 mmol/L (ref 22–32)
Calcium: 10.5 mg/dL — ABNORMAL HIGH (ref 8.9–10.3)
Chloride: 108 mmol/L (ref 98–111)
Creatinine, Ser: 0.82 mg/dL (ref 0.44–1.00)
GFR calc Af Amer: >60 mL/min (ref >60)
GFR calc non Af Amer: >60 mL/min (ref >60)
Glucose, Bld: 231 mg/dL — ABNORMAL HIGH (ref 70–99)
Potassium: 3.9 mmol/L (ref 3.5–5.1)
Sodium: 139 mmol/L (ref 135–145)
Total Bilirubin: 0.4 mg/dL (ref 0.3–1.2)
Total Protein: 7.5 g/dL (ref 6.5–8.1)

## 2018-10-08 LAB — CBC WITH DIFFERENTIAL/PLATELET
ABS IMMATURE GRANULOCYTES: 0.01 10*3/uL (ref 0.00–0.07)
Basophils Absolute: 0 10*3/uL (ref 0.0–0.1)
Basophils Relative: 1 %
Eosinophils Absolute: 0.2 10*3/uL (ref 0.0–0.5)
Eosinophils Relative: 5 %
HCT: 35 % — ABNORMAL LOW (ref 36.0–46.0)
Hemoglobin: 10.6 g/dL — ABNORMAL LOW (ref 12.0–15.0)
Immature Granulocytes: 0 %
Lymphocytes Relative: 24 %
Lymphs Abs: 1 10*3/uL (ref 0.7–4.0)
MCH: 27.7 pg (ref 26.0–34.0)
MCHC: 30.3 g/dL (ref 30.0–36.0)
MCV: 91.6 fL (ref 80.0–100.0)
MONO ABS: 0.3 10*3/uL (ref 0.1–1.0)
Monocytes Relative: 8 %
Neutro Abs: 2.5 10*3/uL (ref 1.7–7.7)
Neutrophils Relative %: 62 %
Platelets: 118 10*3/uL — ABNORMAL LOW (ref 150–400)
RBC: 3.82 MIL/uL — ABNORMAL LOW (ref 3.87–5.11)
RDW: 15.7 % — ABNORMAL HIGH (ref 11.5–15.5)
WBC: 4 10*3/uL (ref 4.0–10.5)
nRBC: 0 % (ref 0.0–0.2)

## 2018-10-08 LAB — LACTATE DEHYDROGENASE: LDH: 102 U/L (ref 98–192)

## 2018-10-08 MED ORDER — SODIUM CHLORIDE 0.9 % IV SOLN
Freq: Once | INTRAVENOUS | Status: AC
  Administered 2018-10-08: 12:00:00 via INTRAVENOUS

## 2018-10-08 MED ORDER — SODIUM CHLORIDE 0.9 % IV SOLN
3.6000 mg/kg | Freq: Once | INTRAVENOUS | Status: AC
  Administered 2018-10-08: 420 mg via INTRAVENOUS
  Filled 2018-10-08: qty 16

## 2018-10-08 MED ORDER — HEPARIN SOD (PORK) LOCK FLUSH 100 UNIT/ML IV SOLN
500.0000 [IU] | Freq: Once | INTRAVENOUS | Status: AC | PRN
  Administered 2018-10-08: 500 [IU]

## 2018-10-08 MED ORDER — DIPHENHYDRAMINE HCL 25 MG PO CAPS: 50.0000 mg | ORAL_CAPSULE | Freq: Once | ORAL | Status: DC

## 2018-10-08 MED ORDER — ACETAMINOPHEN 325 MG PO TABS: 650.0000 mg | ORAL_TABLET | Freq: Once | ORAL | Status: DC

## 2018-10-08 MED ORDER — SODIUM CHLORIDE 0.9% FLUSH
10.0000 mL | INTRAVENOUS | Status: DC | PRN
  Administered 2018-10-08: 10 mL
  Filled 2018-10-08: qty 10

## 2018-10-08 NOTE — Progress Notes (Signed)
Diagnosis Malignant neoplasm of upper-inner quadrant of right breast in female, estrogen receptor positive ([place]) - Plan: CBC with Differential/Platelet, Comprehensive metabolic panel, Lactate dehydrogenase  Staging Cancer Staging Malignant neoplasm of upper-inner quadrant of right breast in female, estrogen receptor positive ([place]) Staging form: Breast, AJCC 8th Edition - Clinical stage from 04/22/2017: Stage IB (cT2, cN0, cM0, G3, ER: Positive, PR: Positive, HER2: Positive) - Unsigned Staging comments: Staged at breast conference on 7.11.18   Assessment and Plan:  Malignant neoplasm of upper-inner quadrant of right breast in female, estrogen receptor positive ([place]) 1.  Clinical T2N0 HER-2 positive right breast cancer: -Biopsy of the right breast upper inner quadrant 1:00 mass, IDC, ER/PR positive, HER-2 positive, Ki-67 of 60% -Neoadjuvant chemotherapy with 6 cycles of TCHP from 05/29/2017 through 10/09/2017 -Right lumpectomy and lymph node biopsy on 12/09/2017, with lumpectomy #1 specimen with no malignancy, lumpectomy #2 specimen showing 2 cm IDC, grade 3, 1 out of 6 lymph nodes positive for malignancy.  ([id number]) - Based on KATHRINE trial, started on Kadcyla 3.6 mg/kg for 14 cycles started on 01/20/2018, tolerating it very well. - Patient could not tolerate letrozole resulting in chest pain and right lateral abdominal pain, went to the ER, CT abdomen and pelvis on 02/23/2018 shows stable anterior abdominal wall hernia, discontinued letrozole.  Completed radiation therapy. -Her mammogram on 05/04/2018  was BI-RADS Category 2. -Pt started anastrozole daily on 05/05/2018 and is tolerating it very well.  She denies chest pains at this time.    Pt here for evaluation prior to [place].   Labs done 10/08/2018 reviewed and showed WBC 4 HB 10.6 plts 118,000.  Chemistries WNL with K+ 3.9, Cr 0.82.   Pt will follow-up with Dr. Worthy Keeler in 3 weeks prior to next cycle.   ECHO done 07/26/2018 showed  EF 60-65%.    2.  Elevated LFTs.  AST mildly elevated at 51.  ALT 28.  Pt reports she was started on Vascepa by Cardiology for cholesterol.  Pt will have repeat labs in 3 weeks with follow-up with Dr. Worthy Keeler.    3.  Peripheral neuropathy.  Pt is on gabapentin 600 mg 3 times a day, started by her PMD.  Follow-up with PCP as directed.    4.  Mild hypercalcemia.  Pt has mild hypercalcemia dating back to 2012.  Her vitamin D level was slightly low.  PTH was in the normal range at 35.  Calcium level 10.5 on blood work done 10/08/2018.  Repeat chemistries in 3 weeks on RTC.    25 minutes spent with more than 50% spent in counseling and coordination of care.    Current Status:  Pt is seen today for follow-up.  She is here for evaluation prior to Kadcyla.  She is tolerating Arimidex.      Malignant neoplasm of upper-inner quadrant of right breast in female, estrogen receptor positive ([place])   04/13/2017 Initial Diagnosis    Screening detected right breast mass with calcifications 2.1 cm in size and 3.4 cm in size. Calcifications were fibroadenoma. Right breast biopsy upper inner quadrant 1:00 mass: IDC with DCIS with necrosis, grade 3, ER 100%, PR 70%, Ki-67 60%, HER-2 positive ratio 2.34, T2 N1 stage IB (New AJCC)    04/25/2017 Breast MRI    Right breast upper inner quadrant 2.2 x 1.7 x 2.7 cm mass, abnormal area of clumped non-mass enhancement in the right lateral breast 3.2 x 1.9 x 1 cm, too abnormal lymph nodes right axilla 4.3 and 1.7  cm (biopsy-proven breast cancer)     05/08/2017 Procedure    Right axilla lymph node biopsy: Metastatic carcinoma    05/13/2017 Procedure    Right breast biopsy retroareolar region: Intraductal papilloma    01/20/2018 -  Chemotherapy    The patient had ado-trastuzumab emtansine (KADCYLA) 420 mg in sodium chloride 0.9 % 250 mL chemo infusion, 3.6 mg/kg = 420 mg, Intravenous, Once, 13 of 14 cycles Administration: 420 mg (01/20/2018), 420 mg (02/10/2018), 420 mg  (04/14/2018), 420 mg (03/03/2018), 420 mg (03/24/2018), 420 mg (05/05/2018), 420 mg (05/26/2018), 420 mg (06/16/2018), 420 mg (07/08/2018), 420 mg (07/29/2018), 420 mg (08/19/2018), 420 mg (09/13/2018), 420 mg (10/08/2018)  for chemotherapy treatment.       Problem List Patient Active Problem List   Diagnosis Date Noted  . Weight loss [R63.4] 06/13/2017  . Malignant neoplasm of upper-inner quadrant of right breast in female, estrogen receptor positive ([place]) [C50.211, Z17.0] 04/16/2017    Past Medical History Past Medical History:  Diagnosis Date  . Asthma   . Blood transfusion    as child  . Breast cancer ([place]) 2018  . Cancer ([place])   . Diabetes ([place])    type 2   . GERD (gastroesophageal reflux disease)   . Headache   . Heart murmur    d/t aortic stenosis   . History of kidney stones   . Hyperlipidemia   . Hypertension   . Normal echocardiogram   . Personal history of chemotherapy 2018  . Personal history of radiation therapy 2018  . Sleep apnea     Past Surgical History Past Surgical History:  Procedure Laterality Date  . ABDOMINAL HYSTERECTOMY  2004  . BREAST LUMPECTOMY Right 2018  . BREAST LUMPECTOMY WITH RADIOACTIVE SEED AND SENTINEL LYMPH NODE BIOPSY Right 12/09/2017   Procedure: RIGHT BREAST LUMPECTOMY WITH RADIOACTIVE SEED AND SENTINEL LYMPH NODE BIOPSY AND RADIOACTIVE SEED TARGETED RIGHT AXILLARY LYMPH NODE EXCISION;  Surgeon: Alphonsa Overall, MD;  Location: [facility];  Service: General;  Laterality: Right;  . CHOLECYSTECTOMY  05/30/2011   Procedure: LAPAROSCOPIC CHOLECYSTECTOMY;  Surgeon: Donato Heinz;  Location: [facility];  Service: General;  Laterality: N/A;  . 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 KIDNEY STONE SURGERY     2017 2-23  . PORTACATH PLACEMENT N/A 05/25/2017   Procedure: INSERTION PORT-A-CATH;  Surgeon: Alphonsa Overall, MD;  Location: [facility];  Service: General;  Laterality: N/A;    Family History Family History  Problem Relation Age of  Onset  . Lung cancer Maternal Grandmother   . Anesthesia problems Neg Hx   . Hypotension Neg Hx   . Malignant hyperthermia Neg Hx   . Pseudochol deficiency Neg Hx      Social History  reports that she has never smoked. She has never used smokeless tobacco. She reports current alcohol use. She reports that she does not use drugs.  Medications  Current Outpatient Medications:  .  acetaminophen (TYLENOL) 500 MG tablet, Take 1,000 mg by mouth daily as needed for moderate pain or headache., Disp: , Rfl:  .  Ado-Trastuzumab Emtansine (KADCYLA IV), Inject into the vein., Disp: , Rfl:  .  albuterol (PROVENTIL HFA;VENTOLIN HFA) 108 (90 BASE) MCG/ACT inhaler, Inhale 1 puff into the lungs every 6 (six) hours as needed for wheezing or shortness of breath. , Disp: , Rfl:  .  anastrozole (ARIMIDEX) 1 MG tablet, Take 1 tablet (1 mg total) by mouth daily., Disp: 30  tablet, Rfl: 6 .  escitalopram (LEXAPRO) 20 MG tablet, Take 10 mg by mouth at bedtime. , Disp: , Rfl:  .  fenofibrate 160 MG tablet, Take 160 mg by mouth at bedtime. , Disp: , Rfl:  .  gabapentin (NEURONTIN) 600 MG tablet, Take 600 mg by mouth 3 (three) times daily., Disp: , Rfl:  .  lidocaine-prilocaine (EMLA) cream, Apply 1 application topically daily as needed (port access)., Disp: 30 g, Rfl: 1 .  losartan (COZAAR) 50 MG tablet, Take 25 mg by mouth at bedtime. , Disp: , Rfl:  .  metFORMIN (GLUCOPHAGE) 500 MG tablet, Take 500 mg by mouth 2 (two) times daily with a meal., Disp: , Rfl:  .  Multiple Vitamin (MULTIVITAMIN) tablet, Take 1 tablet by mouth at bedtime. , Disp: , Rfl:  .  omeprazole (PRILOSEC) 40 MG capsule, Take 1 capsule (40 mg total) by mouth daily., Disp: 30 capsule, Rfl: 5 .  Skin Protectants, Misc. (EUCERIN) cream, Apply 1 application topically as needed for dry skin., Disp: , Rfl:  .  VASCEPA 1 g CAPS, TAKE (2) CAPSULES BY MOUTH TWICE DAILY., Disp: 120 capsule, Rfl: 0 .  diphenhydrAMINE (BENADRYL) 25 mg capsule, Take 25 mg by  mouth at bedtime. , Disp: , Rfl:  .  meclizine (ANTIVERT) 25 MG tablet, Take 1 tablet (25 mg total) by mouth 3 (three) times daily as needed for dizziness. (Patient not taking: Reported on 10/08/2018), Disp: 15 tablet, Rfl: 0 No current facility-administered medications for this visit.   Facility-Administered Medications Ordered in Other Visits:  .  acetaminophen (TYLENOL) tablet 650 mg, 650 mg, Oral, Once, Delante Karapetyan, MD .  diphenhydrAMINE (BENADRYL) capsule 50 mg, 50 mg, Oral, Once, Larnell Granlund, MD .  sodium chloride flush (NS) 0.9 % injection 10 mL, 10 mL, Intracatheter, PRN, Montserrat Shek, MD, 10 mL at 10/08/18 1249  Allergies Latex; Pyridostigmine bromide; and Statins  Review of Systems Review of Systems - Oncology ROS negative   Physical Exam  Vitals Wt Readings from Last 3 Encounters:  10/08/18 243 lb 6.4 oz (110.4 kg)  09/13/18 244 lb 0.8 oz (110.7 kg)  08/19/18 249 lb 6.4 oz (113.1 kg)   Temp Readings from Last 3 Encounters:  10/08/18 97.8 F (36.6 C)  10/08/18 97.9 F (36.6 C) (Oral)  09/13/18 (!) 97.5 F (36.4 C) (Oral)   BP Readings from Last 3 Encounters:  10/08/18 (!) 117/55  10/08/18 (!) 100/42  09/13/18 111/60   Pulse Readings from Last 3 Encounters:  10/08/18 63  10/08/18 68  09/13/18 63   Constitutional: Well-developed, well-nourished, and in no distress.   HENT: Head: Normocephalic and atraumatic.  Mouth/Throat: No oropharyngeal exudate. Mucosa moist. Eyes: Pupils are equal, round, and reactive to light. Conjunctivae are normal. No scleral icterus.  Neck: Normal range of motion. Neck supple. No JVD present.  Cardiovascular: Normal rate, regular rhythm and normal heart sounds.  Exam reveals no gallop and no friction rub.   No murmur heard. Pulmonary/Chest: Effort normal and breath sounds normal. No respiratory distress. No wheezes.No rales.  Abdominal: Soft. Bowel sounds are normal. No distension. There is no tenderness. There is no guarding.   Musculoskeletal: No edema or tenderness.  Lymphadenopathy: No cervical, axillary or supraclavicular adenopathy.  Neurological: Alert and oriented to person, place, and time. No cranial nerve deficit.  Skin: Skin is warm and dry. No rash noted. No erythema. No pallor.  Psychiatric: Affect and judgment normal.   Labs Appointment on 10/08/2018  Component Date Value  Ref Range Status  . LDH 10/08/2018 102  98 - 192 U/L Final   Performed at [facility], [address], [place], [place] [address]  . WBC 10/08/2018 4.0  4.0 - 10.5 K/uL Final  . RBC 10/08/2018 3.82* 3.87 - 5.11 MIL/uL Final  . Hemoglobin 10/08/2018 10.6* 12.0 - 15.0 g/dL Final  . HCT 10/08/2018 35.0* 36.0 - 46.0 % Final  . MCV 10/08/2018 91.6  80.0 - 100.0 fL Final  . MCH 10/08/2018 27.7  26.0 - 34.0 pg Final  . MCHC 10/08/2018 30.3  30.0 - 36.0 g/dL Final  . RDW 10/08/2018 15.7* 11.5 - 15.5 % Final  . Platelets 10/08/2018 118* 150 - 400 K/uL Final   Comment: REPEATED TO VERIFY SPECIMEN CHECKED FOR CLOTS Immature Platelet Fraction may be clinically indicated, consider ordering this additional test [id number] CONSISTENT WITH PREVIOUS RESULT   . nRBC 10/08/2018 0.0  0.0 - 0.2 % Final  . Neutrophils Relative % 10/08/2018 62  % Final  . Neutro Abs 10/08/2018 2.5  1.7 - 7.7 K/uL Final  . Lymphocytes Relative 10/08/2018 24  % Final  . Lymphs Abs 10/08/2018 1.0  0.7 - 4.0 K/uL Final  . Monocytes Relative 10/08/2018 8  % Final  . Monocytes Absolute 10/08/2018 0.3  0.1 - 1.0 K/uL Final  . Eosinophils Relative 10/08/2018 5  % Final  . Eosinophils Absolute 10/08/2018 0.2  0.0 - 0.5 K/uL Final  . Basophils Relative 10/08/2018 1  % Final  . Basophils Absolute 10/08/2018 0.0  0.0 - 0.1 K/uL Final  . Immature Granulocytes 10/08/2018 0  % Final  . Abs Immature Granulocytes 10/08/2018 0.01  0.00 - 0.07 K/uL Final   Performed at [facility], [address], [place], [place] [address]  . Sodium 10/08/2018 139  135 - 145 mmol/L Final   . Potassium 10/08/2018 3.9  3.5 - 5.1 mmol/L Final  . Chloride 10/08/2018 108  98 - 111 mmol/L Final  . CO2 10/08/2018 24  22 - 32 mmol/L Final  . Glucose, Bld 10/08/2018 231* 70 - 99 mg/dL Final  . BUN 10/08/2018 18  6 - 20 mg/dL Final  . Creatinine, Ser 10/08/2018 0.82  0.44 - 1.00 mg/dL Final  . Calcium 10/08/2018 10.5* 8.9 - 10.3 mg/dL Final  . Total Protein 10/08/2018 7.5  6.5 - 8.1 g/dL Final  . Albumin 10/08/2018 3.6  3.5 - 5.0 g/dL Final  . AST 10/08/2018 51* 15 - 41 U/L Final  . ALT 10/08/2018 28  0 - 44 U/L Final  . Alkaline Phosphatase 10/08/2018 82  38 - 126 U/L Final  . Total Bilirubin 10/08/2018 0.4  0.3 - 1.2 mg/dL Final  . GFR calc non Af Amer 10/08/2018 >60  >60 mL/min Final  . GFR calc Af Amer 10/08/2018 >60  >60 mL/min Final  . Anion gap 10/08/2018 7  5 - 15 Final   Performed at [facility], [address] [place], [place] [address]     Pathology Orders Placed This Encounter  Procedures  . CBC with Differential/Platelet    Standing Status:   Future    Standing Expiration Date:   10/08/2020  . Comprehensive metabolic panel    Standing Status:   Future    Standing Expiration Date:   10/08/2020  . Lactate dehydrogenase    Standing Status:   Future    Standing Expiration Date:   10/08/2020       Zoila Shutter MD

## 2018-10-08 NOTE — Progress Notes (Signed)
Pt presents today for Kadcyla. VSS. Pt seen by Dr. Walden Field and labs reviewed, 2D echo reviewed. Proceed with treatment per MD.   Treatment given today per MD orders. Tolerated infusion without adverse affects. Vital signs stable. No complaints at this time. Discharged from clinic ambulatory. F/U with [facility] as scheduled.

## 2018-10-08 NOTE — Patient Instructions (Signed)
[facility] Discharge Instructions for Patients Receiving Chemotherapy  Today you received the following chemotherapy agents   To help prevent nausea and vomiting after your treatment, we encourage you to take your nausea medication   If you develop nausea and vomiting that is not controlled by your nausea medication, call the clinic.   BELOW ARE SYMPTOMS THAT SHOULD BE REPORTED IMMEDIATELY:  *FEVER GREATER THAN 100.5 F  *CHILLS WITH OR WITHOUT FEVER  NAUSEA AND VOMITING THAT IS NOT CONTROLLED WITH YOUR NAUSEA MEDICATION  *UNUSUAL SHORTNESS OF BREATH  *UNUSUAL BRUISING OR BLEEDING  TENDERNESS IN MOUTH AND THROAT WITH OR WITHOUT PRESENCE OF ULCERS  *URINARY PROBLEMS  *BOWEL PROBLEMS  UNUSUAL RASH Items with * indicate a potential emergency and should be followed up as soon as possible.  Feel free to call the clinic should you have any questions or concerns. The clinic phone number is [phone].  Please show the CHEMO ALERT CARD at check-in to the Emergency Department and triage nurse.   

## 2018-10-11 ENCOUNTER — Ambulatory Visit (HOSPITAL_COMMUNITY)
Admission: RE | Admit: 2018-10-11 | Discharge: 2018-10-11 | Disposition: A | Discharge status: Home / Self Care | Payer: 59 | Source: Ambulatory Visit | Attending: Cardiology | Admitting: Cardiology

## 2018-10-11 ENCOUNTER — Ambulatory Visit (HOSPITAL_BASED_OUTPATIENT_CLINIC_OR_DEPARTMENT_OTHER)
Admission: RE | Admit: 2018-10-11 | Discharge: 2018-10-11 | Disposition: A | Discharge status: Home / Self Care | Payer: 59 | Source: Ambulatory Visit | Attending: Cardiology | Admitting: Cardiology

## 2018-10-11 VITALS — BP 132/78 | HR 83 | Wt 242.4 lb

## 2018-10-11 DIAGNOSIS — Z79899 Other long term (current) drug therapy: Secondary | ICD-10-CM | POA: Diagnosis not present

## 2018-10-11 DIAGNOSIS — E781 Pure hyperglyceridemia: Secondary | ICD-10-CM | POA: Insufficient documentation

## 2018-10-11 DIAGNOSIS — Z170 Estrogen receptor positive status [ER+]: Secondary | ICD-10-CM

## 2018-10-11 DIAGNOSIS — C50211 Malignant neoplasm of upper-inner quadrant of right female breast: Secondary | ICD-10-CM

## 2018-10-11 DIAGNOSIS — I10 Essential (primary) hypertension: Secondary | ICD-10-CM | POA: Diagnosis not present

## 2018-10-11 DIAGNOSIS — Z7984 Long term (current) use of oral hypoglycemic drugs: Secondary | ICD-10-CM | POA: Diagnosis not present

## 2018-10-11 DIAGNOSIS — I350 Nonrheumatic aortic (valve) stenosis: Secondary | ICD-10-CM | POA: Insufficient documentation

## 2018-10-11 LAB — LIPID PANEL
Cholesterol: 199 mg/dL (ref 0–200)
HDL: 23 mg/dL — ABNORMAL LOW (ref >40)
LDL CALC: 122 mg/dL — AB (ref 0–99)
Total CHOL/HDL Ratio: 8.7 RATIO
Triglycerides: 272 mg/dL — ABNORMAL HIGH (ref <150)
VLDL: 54 mg/dL — ABNORMAL HIGH (ref 0–40)

## 2018-10-11 NOTE — Patient Instructions (Signed)
Today you have been seen at the Heart failure clinic at [facility] had Lab work done today:  We will call you if your lab work is abnormal.. No news is good news!!   Follow up with Dr.Mclean in one year   You have the following test scheduled for: ECHO in one year

## 2018-10-11 NOTE — Progress Notes (Signed)
  Echocardiogram 2D Echocardiogram has been performed.  Renee Harris 10/11/2018, 11:57 AM

## 2018-10-12 NOTE — Progress Notes (Signed)
Oncology: Dr. Walden Field  57 y.o. with history of HTN and breast cancer presents for cardio-oncology followup.  Patient was diagnosed with breast cancer on the right in 7/18.  ER+/PR+/HER2+.  She had lumpectomy in 2/19 and is currently getting Kadcyla, has only 2 doses left. She has finished radiation.     She is doing well generally.  No exertional dyspnea or chest pain.   PMH: 1. HTN 2. Hypertriglyceridemia 3. Breast cancer: Diagnosed 7/18, on right.  ER+/PR+/HER2+.  She had lumpectomy in 2/19 and has completed radiation.  She is on Kadcyla to 9/19.    - Echo (7/18): EF 65-70%, GLS -19.8%, mild AS with mean gradient 15 mmHg, normal RV size and systolic function.  - Echo (10/18): EF 65-70%, mild LVH, GLS -19.6%, moderate AS with mean gradient 26, AVA 1.3 cm^2.  - Echo (4/19): EF 60-65%, GLS -18.6%, mild AS mean gradient 10 mmHg.  - Echo (7/19): EF 60-65%, GLS -15.2%, normal RV size and systolic function, moderate AS with mean gradient 20 and AVA 1.2 cm^2.  - Echo (10/19): EF 60-65%, mild LVH, moderate AS, GLS -16.5%.  - Echo (12/19): EF 60-65%, GLS -15.9%, mild-moderate AS with mean gradient 19 mmHg, AVA 1.41 cm^2.  4. Aortic stenosis: Mild-moderate on 12/19 echo.   Social History   Socioeconomic History  . Marital status: Married    Spouse name: Not on file  . Number of children: Not on file  . Years of education: Not on file  . Highest education level: Not on file  Occupational History  . Not on file  Social Needs  . Financial resource strain: Not on file  . Food insecurity:    Worry: Not on file    Inability: Not on file  . Transportation needs:    Medical: Not on file    Non-medical: Not on file  Tobacco Use  . Smoking status: Never Smoker  . Smokeless tobacco: Never Used  Substance and Sexual Activity  . Alcohol use: Yes    Comment: 2 x year  . Drug use: No  . Sexual activity: Yes    Birth control/protection: Surgical  Lifestyle  . Physical activity:    Days per week:  Not on file    Minutes per session: Not on file  . Stress: Not on file  Relationships  . Social connections:    Talks on phone: Not on file    Gets together: Not on file    Attends religious service: Not on file    Active member of club or organization: Not on file    Attends meetings of clubs or organizations: Not on file    Relationship status: Not on file  . Intimate partner violence:    Fear of current or ex partner: No    Emotionally abused: No    Physically abused: No    Forced sexual activity: No  Other Topics Concern  . Not on file  Social History Narrative  . Not on file   Family History  Problem Relation Age of Onset  . Lung cancer Maternal Grandmother   . Anesthesia problems Neg Hx   . Hypotension Neg Hx   . Malignant hyperthermia Neg Hx   . Pseudochol deficiency Neg Hx    ROS: All systems reviewed and negative except as per HPI.   Current Outpatient Medications  Medication Sig Dispense Refill  . acetaminophen (TYLENOL) 500 MG tablet Take 1,000 mg by mouth daily as needed for moderate pain or headache.    Marland Kitchen  Ado-Trastuzumab Emtansine (KADCYLA IV) Inject into the vein.    Marland Kitchen albuterol (PROVENTIL HFA;VENTOLIN HFA) 108 (90 BASE) MCG/ACT inhaler Inhale 1 puff into the lungs every 6 (six) hours as needed for wheezing or shortness of breath.     . anastrozole (ARIMIDEX) 1 MG tablet Take 1 tablet (1 mg total) by mouth daily. 30 tablet 6  . escitalopram (LEXAPRO) 20 MG tablet Take 10 mg by mouth at bedtime.     . fenofibrate 160 MG tablet Take 160 mg by mouth at bedtime.     . gabapentin (NEURONTIN) 600 MG tablet Take 600 mg by mouth 3 (three) times daily.    Marland Kitchen lidocaine-prilocaine (EMLA) cream Apply 1 application topically daily as needed (port access). 30 g 1  . losartan (COZAAR) 50 MG tablet Take 25 mg by mouth at bedtime.     . metFORMIN (GLUCOPHAGE) 500 MG tablet Take 500 mg by mouth 2 (two) times daily with a meal.    . Multiple Vitamin (MULTIVITAMIN) tablet Take 1  tablet by mouth at bedtime.     Marland Kitchen omeprazole (PRILOSEC) 40 MG capsule Take 1 capsule (40 mg total) by mouth daily. 30 capsule 5  . Skin Protectants, Misc. (EUCERIN) cream Apply 1 application topically as needed for dry skin.    Marland Kitchen VASCEPA 1 g CAPS TAKE (2) CAPSULES BY MOUTH TWICE DAILY. 120 capsule 0  . diphenhydrAMINE (BENADRYL) 25 mg capsule Take 25 mg by mouth at bedtime.     . meclizine (ANTIVERT) 25 MG tablet Take 1 tablet (25 mg total) by mouth 3 (three) times daily as needed for dizziness. (Patient not taking: Reported on 10/08/2018) 15 tablet 0   No current facility-administered medications for this encounter.    BP 132/78   Pulse 83   Wt 110 kg (242 lb 6.4 oz)   SpO2 91%   BMI 45.80 kg/m  General: NAD Neck: No JVD, no thyromegaly or thyroid nodule.  Lungs: Clear to auscultation bilaterally with normal respiratory effort. CV: Nondisplaced PMI.  Heart regular S1/S2, no S3/S4, 3/6 SEM RUSB with clear S2.  No peripheral edema.  No carotid bruit.  Normal pedal pulses.  Abdomen: Soft, nontender, no hepatosplenomegaly, no distention.  Skin: Intact without lesions or rashes.  Neurologic: Alert and oriented x 3.  Psych: Normal affect. Extremities: No clubbing or cyanosis.  HEENT: Normal.   Assessment/Plan: 1. Breast cancer: Patient is currently on Kadcyla, she has 2 more doses.  Echo today was reviewed, EF and strain stable.    - I think we can hold off on further chemo screening echoes as long as plan for Kadcyla does not change. 2. HTN: BP reasonably controlled.   3. Aortic stenosis: Mild-moderate on today's echo.  I will repeat echo in 1 year to follow aortic stenosis.  4. Hypertriglyceridemia: On fenofibrate and Vascepa.  Check lipids today.   Followup in 1 year with echo.   Loralie Champagne 10/12/2018

## 2018-10-28 ENCOUNTER — Other Ambulatory Visit (HOSPITAL_COMMUNITY): Payer: Self-pay | Admitting: Hematology

## 2018-10-29 ENCOUNTER — Ambulatory Visit (HOSPITAL_COMMUNITY): Payer: Medicaid Other

## 2018-10-29 ENCOUNTER — Other Ambulatory Visit (HOSPITAL_COMMUNITY): Payer: Medicaid Other

## 2018-10-29 ENCOUNTER — Telehealth (HOSPITAL_COMMUNITY): Payer: Self-pay | Admitting: *Deleted

## 2018-10-29 NOTE — Telephone Encounter (Signed)
Pt called stating that she has been sick throwing up since last night. Pt states that she has had loose stools as well. I advised the pt that it would be best to reschedule her appointment due to the possibility of her having the stomach bug. I advised the pt to try to push her fluids to stay hydrated. Pt verbalized understanding. Phone call was transferred to front office scheduler to reschedule her appointment. I also informed the treatment nurses that she had rescheduled.

## 2018-11-04 ENCOUNTER — Encounter (HOSPITAL_COMMUNITY): Payer: Self-pay

## 2018-11-04 ENCOUNTER — Inpatient Hospital Stay (HOSPITAL_COMMUNITY): Payer: BLUE CROSS/BLUE SHIELD | Attending: Hematology

## 2018-11-04 ENCOUNTER — Inpatient Hospital Stay (HOSPITAL_COMMUNITY): Payer: BLUE CROSS/BLUE SHIELD

## 2018-11-04 VITALS — BP 114/68 | HR 58 | Temp 98.2°F | Resp 18 | Wt 242.8 lb

## 2018-11-04 DIAGNOSIS — C50211 Malignant neoplasm of upper-inner quadrant of right female breast: Secondary | ICD-10-CM | POA: Diagnosis not present

## 2018-11-04 DIAGNOSIS — Z170 Estrogen receptor positive status [ER+]: Principal | ICD-10-CM

## 2018-11-04 DIAGNOSIS — Z5112 Encounter for antineoplastic immunotherapy: Secondary | ICD-10-CM | POA: Insufficient documentation

## 2018-11-04 LAB — COMPREHENSIVE METABOLIC PANEL
ALT: 22 U/L (ref 0–44)
AST: 40 U/L (ref 15–41)
Albumin: 3.6 g/dL (ref 3.5–5.0)
Alkaline Phosphatase: 94 U/L (ref 38–126)
Anion gap: 8 (ref 5–15)
BUN: 14 mg/dL (ref 6–20)
CALCIUM: 10.4 mg/dL — AB (ref 8.9–10.3)
CO2: 25 mmol/L (ref 22–32)
CREATININE: 0.87 mg/dL (ref 0.44–1.00)
Chloride: 107 mmol/L (ref 98–111)
GFR calc Af Amer: >60 mL/min (ref >60)
GFR calc non Af Amer: >60 mL/min (ref >60)
Glucose, Bld: 266 mg/dL — ABNORMAL HIGH (ref 70–99)
Potassium: 4.2 mmol/L (ref 3.5–5.1)
Sodium: 140 mmol/L (ref 135–145)
Total Bilirubin: 0.8 mg/dL (ref 0.3–1.2)
Total Protein: 7.1 g/dL (ref 6.5–8.1)

## 2018-11-04 LAB — CBC WITH DIFFERENTIAL/PLATELET
Abs Immature Granulocytes: 0.01 10*3/uL (ref 0.00–0.07)
BASOS PCT: 0 %
Basophils Absolute: 0 10*3/uL (ref 0.0–0.1)
Eosinophils Absolute: 0.2 10*3/uL (ref 0.0–0.5)
Eosinophils Relative: 5 %
HCT: 34 % — ABNORMAL LOW (ref 36.0–46.0)
Hemoglobin: 10.1 g/dL — ABNORMAL LOW (ref 12.0–15.0)
Immature Granulocytes: 0 %
Lymphocytes Relative: 29 %
Lymphs Abs: 0.9 10*3/uL (ref 0.7–4.0)
MCH: 26.9 pg (ref 26.0–34.0)
MCHC: 29.7 g/dL — ABNORMAL LOW (ref 30.0–36.0)
MCV: 90.7 fL (ref 80.0–100.0)
Monocytes Absolute: 0.3 10*3/uL (ref 0.1–1.0)
Monocytes Relative: 9 %
Neutro Abs: 1.8 10*3/uL (ref 1.7–7.7)
Neutrophils Relative %: 57 %
Platelets: 102 10*3/uL — ABNORMAL LOW (ref 150–400)
RBC: 3.75 MIL/uL — ABNORMAL LOW (ref 3.87–5.11)
RDW: 15.5 % (ref 11.5–15.5)
WBC: 3.2 10*3/uL — ABNORMAL LOW (ref 4.0–10.5)
nRBC: 0 % (ref 0.0–0.2)

## 2018-11-04 MED ORDER — DIPHENHYDRAMINE HCL 25 MG PO CAPS: 50.0000 mg | ORAL_CAPSULE | Freq: Once | ORAL | Status: DC

## 2018-11-04 MED ORDER — SODIUM CHLORIDE 0.9 % IV SOLN
3.6000 mg/kg | Freq: Once | INTRAVENOUS | Status: AC
  Administered 2018-11-04: 420 mg via INTRAVENOUS
  Filled 2018-11-04: qty 5

## 2018-11-04 MED ORDER — HEPARIN SOD (PORK) LOCK FLUSH 100 UNIT/ML IV SOLN
500.0000 [IU] | Freq: Once | INTRAVENOUS | Status: AC | PRN
  Administered 2018-11-04: 500 [IU]

## 2018-11-04 MED ORDER — ACETAMINOPHEN 325 MG PO TABS: 650.0000 mg | ORAL_TABLET | Freq: Once | ORAL | Status: DC

## 2018-11-04 MED ORDER — SODIUM CHLORIDE 0.9% FLUSH
10.0000 mL | INTRAVENOUS | Status: DC | PRN
  Administered 2018-11-04: 10 mL
  Filled 2018-11-04: qty 10

## 2018-11-04 MED ORDER — SODIUM CHLORIDE 0.9 % IV SOLN
Freq: Once | INTRAVENOUS | Status: AC
  Administered 2018-11-04: 10:00:00 via INTRAVENOUS

## 2018-11-04 NOTE — Patient Instructions (Signed)
[facility] Discharge Instructions for Patients Receiving Chemotherapy  Today you received the following chemotherapy agents  If you develop nausea and vomiting that is not controlled by your nausea medication, call the clinic.   BELOW ARE SYMPTOMS THAT SHOULD BE REPORTED IMMEDIATELY:  *FEVER GREATER THAN 100.5 F  *CHILLS WITH OR WITHOUT FEVER  NAUSEA AND VOMITING THAT IS NOT CONTROLLED WITH YOUR NAUSEA MEDICATION  *UNUSUAL SHORTNESS OF BREATH  *UNUSUAL BRUISING OR BLEEDING  TENDERNESS IN MOUTH AND THROAT WITH OR WITHOUT PRESENCE OF ULCERS  *URINARY PROBLEMS  *BOWEL PROBLEMS  UNUSUAL RASH Items with * indicate a potential emergency and should be followed up as soon as possible.  Feel free to call the clinic should you have any questions or concerns. The clinic phone number is [phone].  Please show the CHEMO ALERT CARD at check-in to the Emergency Department and triage nurse.   

## 2018-11-04 NOTE — Progress Notes (Signed)
ECHO 10/11/2018  Labs reviewed before treatment.    Patient tolerated chemotherapy with no complaints voiced.  Port site clean and dry with no bruising or swelling noted at site.  No complaints of pain with flush.  Band aid applied.  VSS with discharge and left ambulatory with no s/s of distress noted.

## 2018-11-12 DIAGNOSIS — E7849 Other hyperlipidemia: Secondary | ICD-10-CM | POA: Diagnosis not present

## 2018-11-12 DIAGNOSIS — Z1389 Encounter for screening for other disorder: Secondary | ICD-10-CM | POA: Diagnosis not present

## 2018-11-12 DIAGNOSIS — Z6841 Body Mass Index (BMI) 40.0 and over, adult: Secondary | ICD-10-CM | POA: Diagnosis not present

## 2018-11-12 DIAGNOSIS — R011 Cardiac murmur, unspecified: Secondary | ICD-10-CM | POA: Diagnosis not present

## 2018-11-12 DIAGNOSIS — E119 Type 2 diabetes mellitus without complications: Secondary | ICD-10-CM | POA: Diagnosis not present

## 2018-11-12 DIAGNOSIS — I10 Essential (primary) hypertension: Secondary | ICD-10-CM | POA: Diagnosis not present

## 2018-11-17 ENCOUNTER — Other Ambulatory Visit (HOSPITAL_COMMUNITY): Payer: Self-pay | Admitting: Cardiology

## 2018-11-19 ENCOUNTER — Ambulatory Visit (HOSPITAL_COMMUNITY): Payer: Medicaid Other | Admitting: Hematology

## 2018-11-19 ENCOUNTER — Other Ambulatory Visit (HOSPITAL_COMMUNITY): Payer: Medicaid Other

## 2018-11-19 ENCOUNTER — Ambulatory Visit (HOSPITAL_COMMUNITY): Payer: Medicaid Other

## 2018-11-25 ENCOUNTER — Inpatient Hospital Stay (HOSPITAL_COMMUNITY): Payer: BLUE CROSS/BLUE SHIELD | Attending: Hematology

## 2018-11-25 ENCOUNTER — Inpatient Hospital Stay (HOSPITAL_COMMUNITY): Payer: BLUE CROSS/BLUE SHIELD

## 2018-11-25 ENCOUNTER — Other Ambulatory Visit: Payer: Self-pay

## 2018-11-25 ENCOUNTER — Inpatient Hospital Stay (HOSPITAL_BASED_OUTPATIENT_CLINIC_OR_DEPARTMENT_OTHER): Payer: BLUE CROSS/BLUE SHIELD | Admitting: Hematology

## 2018-11-25 ENCOUNTER — Encounter (HOSPITAL_COMMUNITY): Payer: Self-pay | Admitting: Hematology

## 2018-11-25 VITALS — BP 120/66 | HR 76 | Temp 97.8°F | Resp 18 | Wt 242.0 lb

## 2018-11-25 DIAGNOSIS — Z90710 Acquired absence of both cervix and uterus: Secondary | ICD-10-CM | POA: Diagnosis not present

## 2018-11-25 DIAGNOSIS — M8580 Other specified disorders of bone density and structure, unspecified site: Secondary | ICD-10-CM | POA: Insufficient documentation

## 2018-11-25 DIAGNOSIS — Z9221 Personal history of antineoplastic chemotherapy: Secondary | ICD-10-CM

## 2018-11-25 DIAGNOSIS — Z79811 Long term (current) use of aromatase inhibitors: Secondary | ICD-10-CM | POA: Diagnosis not present

## 2018-11-25 DIAGNOSIS — C50211 Malignant neoplasm of upper-inner quadrant of right female breast: Secondary | ICD-10-CM | POA: Insufficient documentation

## 2018-11-25 DIAGNOSIS — G629 Polyneuropathy, unspecified: Secondary | ICD-10-CM | POA: Insufficient documentation

## 2018-11-25 DIAGNOSIS — Z7984 Long term (current) use of oral hypoglycemic drugs: Secondary | ICD-10-CM | POA: Diagnosis not present

## 2018-11-25 DIAGNOSIS — Z79899 Other long term (current) drug therapy: Secondary | ICD-10-CM | POA: Diagnosis not present

## 2018-11-25 DIAGNOSIS — Z923 Personal history of irradiation: Secondary | ICD-10-CM

## 2018-11-25 DIAGNOSIS — I10 Essential (primary) hypertension: Secondary | ICD-10-CM | POA: Insufficient documentation

## 2018-11-25 DIAGNOSIS — Z170 Estrogen receptor positive status [ER+]: Secondary | ICD-10-CM

## 2018-11-25 DIAGNOSIS — E119 Type 2 diabetes mellitus without complications: Secondary | ICD-10-CM | POA: Insufficient documentation

## 2018-11-25 LAB — COMPREHENSIVE METABOLIC PANEL
ALK PHOS: 102 U/L (ref 38–126)
ALT: 23 U/L (ref 0–44)
AST: 48 U/L — AB (ref 15–41)
Albumin: 3.5 g/dL (ref 3.5–5.0)
Anion gap: 8 (ref 5–15)
BUN: 18 mg/dL (ref 6–20)
CALCIUM: 10.8 mg/dL — AB (ref 8.9–10.3)
CO2: 26 mmol/L (ref 22–32)
CREATININE: 0.94 mg/dL (ref 0.44–1.00)
Chloride: 106 mmol/L (ref 98–111)
GFR calc Af Amer: >60 mL/min (ref >60)
GFR calc non Af Amer: >60 mL/min (ref >60)
Glucose, Bld: 221 mg/dL — ABNORMAL HIGH (ref 70–99)
Potassium: 4.1 mmol/L (ref 3.5–5.1)
Sodium: 140 mmol/L (ref 135–145)
Total Bilirubin: 0.7 mg/dL (ref 0.3–1.2)
Total Protein: 7.8 g/dL (ref 6.5–8.1)

## 2018-11-25 LAB — CBC WITH DIFFERENTIAL/PLATELET
Abs Immature Granulocytes: 0.01 10*3/uL (ref 0.00–0.07)
Basophils Absolute: 0 10*3/uL (ref 0.0–0.1)
Basophils Relative: 1 %
Eosinophils Absolute: 0.1 10*3/uL (ref 0.0–0.5)
Eosinophils Relative: 4 %
HEMATOCRIT: 34 % — AB (ref 36.0–46.0)
Hemoglobin: 10.1 g/dL — ABNORMAL LOW (ref 12.0–15.0)
Immature Granulocytes: 0 %
Lymphocytes Relative: 26 %
Lymphs Abs: 0.9 10*3/uL (ref 0.7–4.0)
MCH: 26.4 pg (ref 26.0–34.0)
MCHC: 29.7 g/dL — ABNORMAL LOW (ref 30.0–36.0)
MCV: 89 fL (ref 80.0–100.0)
Monocytes Absolute: 0.4 10*3/uL (ref 0.1–1.0)
Monocytes Relative: 12 %
Neutro Abs: 2 10*3/uL (ref 1.7–7.7)
Neutrophils Relative %: 57 %
Platelets: 113 10*3/uL — ABNORMAL LOW (ref 150–400)
RBC: 3.82 MIL/uL — ABNORMAL LOW (ref 3.87–5.11)
RDW: 15.4 % (ref 11.5–15.5)
WBC: 3.5 10*3/uL — ABNORMAL LOW (ref 4.0–10.5)
nRBC: 0 % (ref 0.0–0.2)

## 2018-11-25 NOTE — Assessment & Plan Note (Signed)
1.  Clinical T2N0 HER-2 positive right breast cancer: -Biopsy of the right breast upper inner quadrant 1:00 mass, IDC, ER/PR positive, HER-2 positive, Ki-67 of 60% -Neoadjuvant chemotherapy with 6 cycles of TCHP from 05/29/2017 through 10/09/2017 -Right lumpectomy and lymph node biopsy on 12/09/2017, with lumpectomy #1 specimen with no malignancy, lumpectomy #2 specimen showing 2 cm IDC, grade 3, 1 out of 6 lymph nodes positive for malignancy.  (yPT1cyPN1a) - Based on KATHRINE trial, started on Kadcyla 3.6 mg/kg for 14 cycles started on 01/20/2018, tolerating it very well. -14 cycles of Kadcyla from 01/20/2018 through 11/04/2018. - She could not tolerate letrozole and developed chest pain and right lateral abdominal pain.  She was switched to anastrozole on 05/05/2018 which she is tolerating well. -Last mammogram on 05/04/2018 shows BI-RADS Category 2.  - Today's physical examination shows lumpectomy scar in the right breast upper outer quadrant slightly thickened stable.  Right breast lymphedema present.  No other palpable masses.  Surgical site has slight tenderness.  No palpable adenopathy. - I will see her back in 4 months for follow-up.  2.  Peripheral neuropathy: -She is continuing gabapentin 600 mg 3 times a day, started by her PMD.  This has been stable in the feet.  She noticed some tingling in the left hand fingertips for last 1 week.  3.  Mild hypercalcemia: -He has mild hypercalcemia dating back to 2012.  She is taking vitamin D but does not take calcium supplements because of kidney stone.  PTH was in the normal range at 35.  4.  Osteopenia: - DEXA scan on 08/25/2018 shows T score of -1.5. -She is continuing vitamin D supplements.

## 2018-11-25 NOTE — Patient Instructions (Signed)
[facility] at [facility] Discharge Instructions     Thank you for choosing [facility] at [facility] to provide your oncology and hematology care.  To afford each patient quality time with our provider, please arrive at least 15 minutes before your scheduled appointment time.   If you have a lab appointment with the Cancer Center please come in thru the  Main Entrance and check in at the main information desk  You need to re-schedule your appointment should you arrive 10 or more minutes late.  We strive to give you quality time with our providers, and arriving late affects you and other patients whose appointments are after yours.  Also, if you no show three or more times for appointments you may be dismissed from the clinic at the providers discretion.     Again, thank you for choosing [facility].  Our hope is that these requests will decrease the amount of time that you wait before being seen by our physicians.       _____________________________________________________________  Should you have questions after your visit to [facility], please contact our office at [phone] between the hours of 8:00 a.m. and 4:30 p.m.  Voicemails left after 4:00 p.m. will not be returned until the following business day.  For prescription refill requests, have your pharmacy contact our office and allow 72 hours.    Cancer Center Support Programs:   > Cancer Support Group  2nd Tuesday of the month 1pm-2pm, Journey Room    

## 2018-11-25 NOTE — Progress Notes (Signed)
Renee Harris, Renee Harris [id number]   CLINIC:  Medical Oncology/Hematology  PCP:  Sharilyn Sites, MD [address] [place] [address] [phone]   REASON FOR VISIT: Follow-up for right breast cancer, ER+/PR+/HER2+  PREVIOUS THERAPY: IV kadcyla every 3 weeks  CURRENT THERAPY:  Anastrozole daily, surveillance per NCCN guidlines.   BRIEF ONCOLOGIC HISTORY:    Malignant neoplasm of upper-inner quadrant of right breast in female, estrogen receptor positive ([place])   04/13/2017 Initial Diagnosis    Screening detected right breast mass with calcifications 2.1 cm in size and 3.4 cm in size. Calcifications were fibroadenoma. Right breast biopsy upper inner quadrant 1:00 mass: IDC with DCIS with necrosis, grade 3, ER 100%, PR 70%, Ki-67 60%, HER-2 positive ratio 2.34, T2 N1 stage IB (New AJCC)    04/25/2017 Breast MRI    Right breast upper inner quadrant 2.2 x 1.7 x 2.7 cm mass, abnormal area of clumped non-mass enhancement in the right lateral breast 3.2 x 1.9 x 1 cm, too abnormal lymph nodes right axilla 4.3 and 1.7 cm (biopsy-proven breast cancer)     05/08/2017 Procedure    Right axilla lymph node biopsy: Metastatic carcinoma    05/13/2017 Procedure    Right breast biopsy retroareolar region: Intraductal papilloma    01/20/2018 -  Chemotherapy    The patient had ado-trastuzumab emtansine (KADCYLA) 420 mg in sodium chloride 0.9 % 250 mL chemo infusion, 3.6 mg/kg = 420 mg, Intravenous, Once, 14 of 14 cycles Administration: 420 mg (01/20/2018), 420 mg (02/10/2018), 420 mg (04/14/2018), 420 mg (03/03/2018), 420 mg (03/24/2018), 420 mg (05/05/2018), 420 mg (05/26/2018), 420 mg (06/16/2018), 420 mg (07/08/2018), 420 mg (07/29/2018), 420 mg (08/19/2018), 420 mg (09/13/2018), 420 mg (10/08/2018), 420 mg (11/04/2018)  for chemotherapy treatment.       CANCER STAGING: Cancer Staging Malignant neoplasm of upper-inner quadrant of right breast in female, estrogen receptor  positive ([place]) Staging form: Breast, AJCC 8th Edition - Clinical stage from 04/22/2017: Stage IB (cT2, cN0, cM0, G3, ER: Positive, PR: Positive, HER2: Positive) - Unsigned    INTERVAL HISTORY:  Renee Harris 58 y.o. female returns for routine follow-up for right breast cancer. She is here and feeling well today. She does has episodes where she is dizzy at times. She has a intermittent pain in her right breast. Denies any nausea, vomiting, or diarrhea. Denies any new pains. Had not noticed any recent bleeding such as epistaxis, hematuria or hematochezia. Denies recent chest pain on exertion, shortness of breath on minimal exertion, pre-syncopal episodes, or palpitations. Denies any numbness or tingling in hands or feet. Denies any recent fevers, infections, or recent hospitalizations. Patient reports appetite at 100% and energy level at 50%.   REVIEW OF SYSTEMS:  Review of Systems  Neurological: Positive for dizziness.  All other systems reviewed and are negative.    PAST MEDICAL/SURGICAL HISTORY:  Past Medical History:  Diagnosis Date  . Asthma   . Blood transfusion    as child  . Breast cancer ([place]) 2018  . Cancer ([place])   . Diabetes ([place])    type 2   . GERD (gastroesophageal reflux disease)   . Headache   . Heart murmur    d/t aortic stenosis   . History of kidney stones   . Hyperlipidemia   . Hypertension   . Normal echocardiogram   . Personal history of chemotherapy 2018  . Personal history of radiation therapy 2018  . Sleep apnea  Past Surgical History:  Procedure Laterality Date  . ABDOMINAL HYSTERECTOMY  2004  . BREAST LUMPECTOMY Right 2018  . BREAST LUMPECTOMY WITH RADIOACTIVE SEED AND SENTINEL LYMPH NODE BIOPSY Right 12/09/2017   Procedure: RIGHT BREAST LUMPECTOMY WITH RADIOACTIVE SEED AND SENTINEL LYMPH NODE BIOPSY AND RADIOACTIVE SEED TARGETED RIGHT AXILLARY LYMPH NODE EXCISION;  Surgeon: Alphonsa Overall, MD;  Location: [facility];  Service: General;  Laterality: Right;  .  CHOLECYSTECTOMY  05/30/2011   Procedure: LAPAROSCOPIC CHOLECYSTECTOMY;  Surgeon: Donato Heinz;  Location: [facility];  Service: General;  Laterality: N/A;  . 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 KIDNEY STONE SURGERY     2017 2-23  . PORTACATH PLACEMENT N/A 05/25/2017   Procedure: INSERTION PORT-A-CATH;  Surgeon: Alphonsa Overall, MD;  Location: [facility];  Service: General;  Laterality: N/A;     SOCIAL HISTORY:  Social History   Socioeconomic History  . Marital status: Married    Spouse name: Not on file  . Number of children: Not on file  . Years of education: Not on file  . Highest education level: Not on file  Occupational History  . Not on file  Social Needs  . Financial resource strain: Not on file  . Food insecurity:    Worry: Not on file    Inability: Not on file  . Transportation needs:    Medical: Not on file    Non-medical: Not on file  Tobacco Use  . Smoking status: Never Smoker  . Smokeless tobacco: Never Used  Substance and Sexual Activity  . Alcohol use: Yes    Comment: 2 x year  . Drug use: No  . Sexual activity: Yes    Birth control/protection: Surgical  Lifestyle  . Physical activity:    Days per week: Not on file    Minutes per session: Not on file  . Stress: Not on file  Relationships  . Social connections:    Talks on phone: Not on file    Gets together: Not on file    Attends religious service: Not on file    Active member of club or organization: Not on file    Attends meetings of clubs or organizations: Not on file    Relationship status: Not on file  . Intimate partner violence:    Fear of current or ex partner: No    Emotionally abused: No    Physically abused: No    Forced sexual activity: No  Other Topics Concern  . Not on file  Social History Narrative  . Not on file    FAMILY HISTORY:  Family History  Problem Relation Age of Onset  . Lung cancer Maternal Grandmother   . Anesthesia problems Neg Hx   .  Hypotension Neg Hx   . Malignant hyperthermia Neg Hx   . Pseudochol deficiency Neg Hx     CURRENT MEDICATIONS:  Outpatient Encounter Medications as of 11/25/2018  Medication Sig Note  . acetaminophen (TYLENOL) 500 MG tablet Take 1,000 mg by mouth daily as needed for moderate pain or headache.   . Ado-Trastuzumab Emtansine (KADCYLA IV) Inject into the vein. 5/14/2019Frances [facility] at [facility]  Marland Kitchen albuterol (PROVENTIL HFA;VENTOLIN HFA) 108 (90 BASE) MCG/ACT inhaler Inhale 1 puff into the lungs every 6 (six) hours as needed for wheezing or shortness of breath.    . anastrozole (ARIMIDEX) 1 MG tablet Take 1 tablet (1 mg total) by mouth daily.   Marland Kitchen  diphenhydrAMINE (BENADRYL) 25 mg capsule Take 25 mg by mouth at bedtime.    Marland Kitchen escitalopram (LEXAPRO) 20 MG tablet Take 10 mg by mouth at bedtime.    . fenofibrate 160 MG tablet Take 160 mg by mouth at bedtime.    . gabapentin (NEURONTIN) 600 MG tablet Take 600 mg by mouth 3 (three) times daily.   Marland Kitchen lidocaine-prilocaine (EMLA) cream Apply 1 application topically daily as needed (port access).   Marland Kitchen losartan (COZAAR) 50 MG tablet Take 25 mg by mouth at bedtime.    . meclizine (ANTIVERT) 25 MG tablet Take 1 tablet (25 mg total) by mouth 3 (three) times daily as needed for dizziness.   . metFORMIN (GLUCOPHAGE) 500 MG tablet Take 500 mg by mouth 2 (two) times daily with a meal.   . Multiple Vitamin (MULTIVITAMIN) tablet Take 1 tablet by mouth at bedtime.    Marland Kitchen omeprazole (PRILOSEC) 40 MG capsule Take 1 capsule (40 mg total) by mouth daily.   . Skin Protectants, Misc. (EUCERIN) cream Apply 1 application topically as needed for dry skin.   Marland Kitchen VASCEPA 1 g CAPS TAKE (2) CAPSULES BY MOUTH TWICE DAILY.    No facility-administered encounter medications on file as of 11/25/2018.     ALLERGIES:  Allergies  Allergen Reactions  . Latex Itching  . Pyridostigmine Bromide Other (See Comments)    Makes muscles twitch  . Statins Nausea Only      PHYSICAL EXAM:  ECOG Performance status: 1  Vitals:   11/25/18 0950  BP: 120/66  Pulse: 76  Resp: 18  Temp: 97.8 F (36.6 C)  SpO2: 93%   Filed Weights   11/25/18 0950  Weight: 242 lb (109.8 kg)    Physical Exam Constitutional:      Appearance: Normal appearance. She is normal weight.  Cardiovascular:     Rate and Rhythm: Normal rate and regular rhythm.     Heart sounds: Normal heart sounds.  Pulmonary:     Effort: Pulmonary effort is normal.     Breath sounds: Normal breath sounds.  Musculoskeletal: Normal range of motion.  Skin:    General: Skin is warm and dry.  Neurological:     Mental Status: She is alert and oriented to person, place, and time. Mental status is at baseline.  Psychiatric:        Mood and Affect: Mood normal.        Behavior: Behavior normal.        Thought Content: Thought content normal.        Judgment: Judgment normal.   Breast: No palpable masses, no skin changes or nipple discharge, no adenopathy. Right breast lymphedema present.   LABORATORY DATA:  I have reviewed the labs as listed.  CBC    Component Value Date/Time   WBC 3.5 (L) 11/25/2018 0920   RBC 3.82 (L) 11/25/2018 0920   HGB 10.1 (L) 11/25/2018 0920   HGB 13.8 04/22/2017 0831   HCT 34.0 (L) 11/25/2018 0920   HCT 41.9 04/22/2017 0831   PLT 113 (L) 11/25/2018 0920   PLT 184 04/22/2017 0831   MCV 89.0 11/25/2018 0920   MCV 85.7 04/22/2017 0831   MCH 26.4 11/25/2018 0920   MCHC 29.7 (L) 11/25/2018 0920   RDW 15.4 11/25/2018 0920   RDW 13.9 04/22/2017 0831   LYMPHSABS 0.9 11/25/2018 0920   LYMPHSABS 2.1 04/22/2017 0831   MONOABS 0.4 11/25/2018 0920   MONOABS 0.5 04/22/2017 0831   EOSABS 0.1 11/25/2018 0920  EOSABS 0.3 04/22/2017 0831   BASOSABS 0.0 11/25/2018 0920   BASOSABS 0.0 04/22/2017 0831   CMP Latest Ref Rng & Units 11/25/2018 11/04/2018 10/08/2018  Glucose 70 - 99 mg/dL 221(H) 266(H) 231(H)  BUN 6 - 20 mg/dL '18 14 18  ' Creatinine 0.44 - 1.00 mg/dL  0.94 0.87 0.82  Sodium 135 - 145 mmol/L 140 140 139  Potassium 3.5 - 5.1 mmol/L 4.1 4.2 3.9  Chloride 98 - 111 mmol/L 106 107 108  CO2 22 - 32 mmol/L '26 25 24  ' Calcium 8.9 - 10.3 mg/dL 10.8(H) 10.4(H) 10.5(H)  Total Protein 6.5 - 8.1 g/dL 7.8 7.1 7.5  Total Bilirubin 0.3 - 1.2 mg/dL 0.7 0.8 0.4  Alkaline Phos 38 - 126 U/L 102 94 82  AST 15 - 41 U/L 48(H) 40 51(H)  ALT 0 - 44 U/L '23 22 28       ' DIAGNOSTIC IMAGING:  I have independently reviewed the scans and discussed with the patient.   I have reviewed Francene Finders, NP's note and agree with the documentation.  I personally performed a face-to-face visit, made revisions and my assessment and plan is as follows.    ASSESSMENT & PLAN:   Malignant neoplasm of upper-inner quadrant of right breast in female, estrogen receptor positive ([place]) 1.  Clinical T2N0 HER-2 positive right breast cancer: -Biopsy of the right breast upper inner quadrant 1:00 mass, IDC, ER/PR positive, HER-2 positive, Ki-67 of 60% -Neoadjuvant chemotherapy with 6 cycles of TCHP from 05/29/2017 through 10/09/2017 -Right lumpectomy and lymph node biopsy on 12/09/2017, with lumpectomy #1 specimen with no malignancy, lumpectomy #2 specimen showing 2 cm IDC, grade 3, 1 out of 6 lymph nodes positive for malignancy.  (yPT1cyPN1a) - Based on KATHRINE trial, started on Kadcyla 3.6 mg/kg for 14 cycles started on 01/20/2018, tolerating it very well. -14 cycles of Kadcyla from 01/20/2018 through 11/04/2018. - She could not tolerate letrozole and developed chest pain and right lateral abdominal pain.  She was switched to anastrozole on 05/05/2018 which she is tolerating well. -Last mammogram on 05/04/2018 shows BI-RADS Category 2.  - Today's physical examination shows lumpectomy scar in the right breast upper outer quadrant slightly thickened stable.  Right breast lymphedema present.  No other palpable masses.  Surgical site has slight tenderness.  No palpable adenopathy. - I will see  her back in 4 months for follow-up.  2.  Peripheral neuropathy: -She is continuing gabapentin 600 mg 3 times a day, started by her PMD.  This has been stable in the feet.  She noticed some tingling in the left hand fingertips for last 1 week.  3.  Mild hypercalcemia: -He has mild hypercalcemia dating back to 2012.  She is taking vitamin D but does not take calcium supplements because of kidney stone.  PTH was in the normal range at 35.  4.  Osteopenia: - DEXA scan on 08/25/2018 shows T score of -1.5. -She is continuing vitamin D supplements.      Orders placed this encounter:  Orders Placed This Encounter  Procedures  . MM Digital Diagnostic Bilat      Derek Jack, MD [place] [phone]

## 2018-11-25 NOTE — Progress Notes (Signed)
Kadcyla infusion complete ; no further treatments per MD

## 2018-12-03 DIAGNOSIS — C50911 Malignant neoplasm of unspecified site of right female breast: Secondary | ICD-10-CM | POA: Diagnosis not present

## 2018-12-03 DIAGNOSIS — Z170 Estrogen receptor positive status [ER+]: Secondary | ICD-10-CM | POA: Diagnosis not present

## 2018-12-15 ENCOUNTER — Other Ambulatory Visit (HOSPITAL_COMMUNITY): Payer: Self-pay | Admitting: Cardiology

## 2019-01-22 IMAGING — MG MM CLIP PLACEMENT
2 series · 2 of 2 positions shown · non-contrast
Comparison: Previous exam(s).

CLINICAL DATA: Status post ultrasound-guided core biopsy of
abnormal right axillary tail lymph node.

EXAM:
DIAGNOSTIC RIGHT MAMMOGRAM POST ULTRASOUND BIOPSY

[R XCCL]
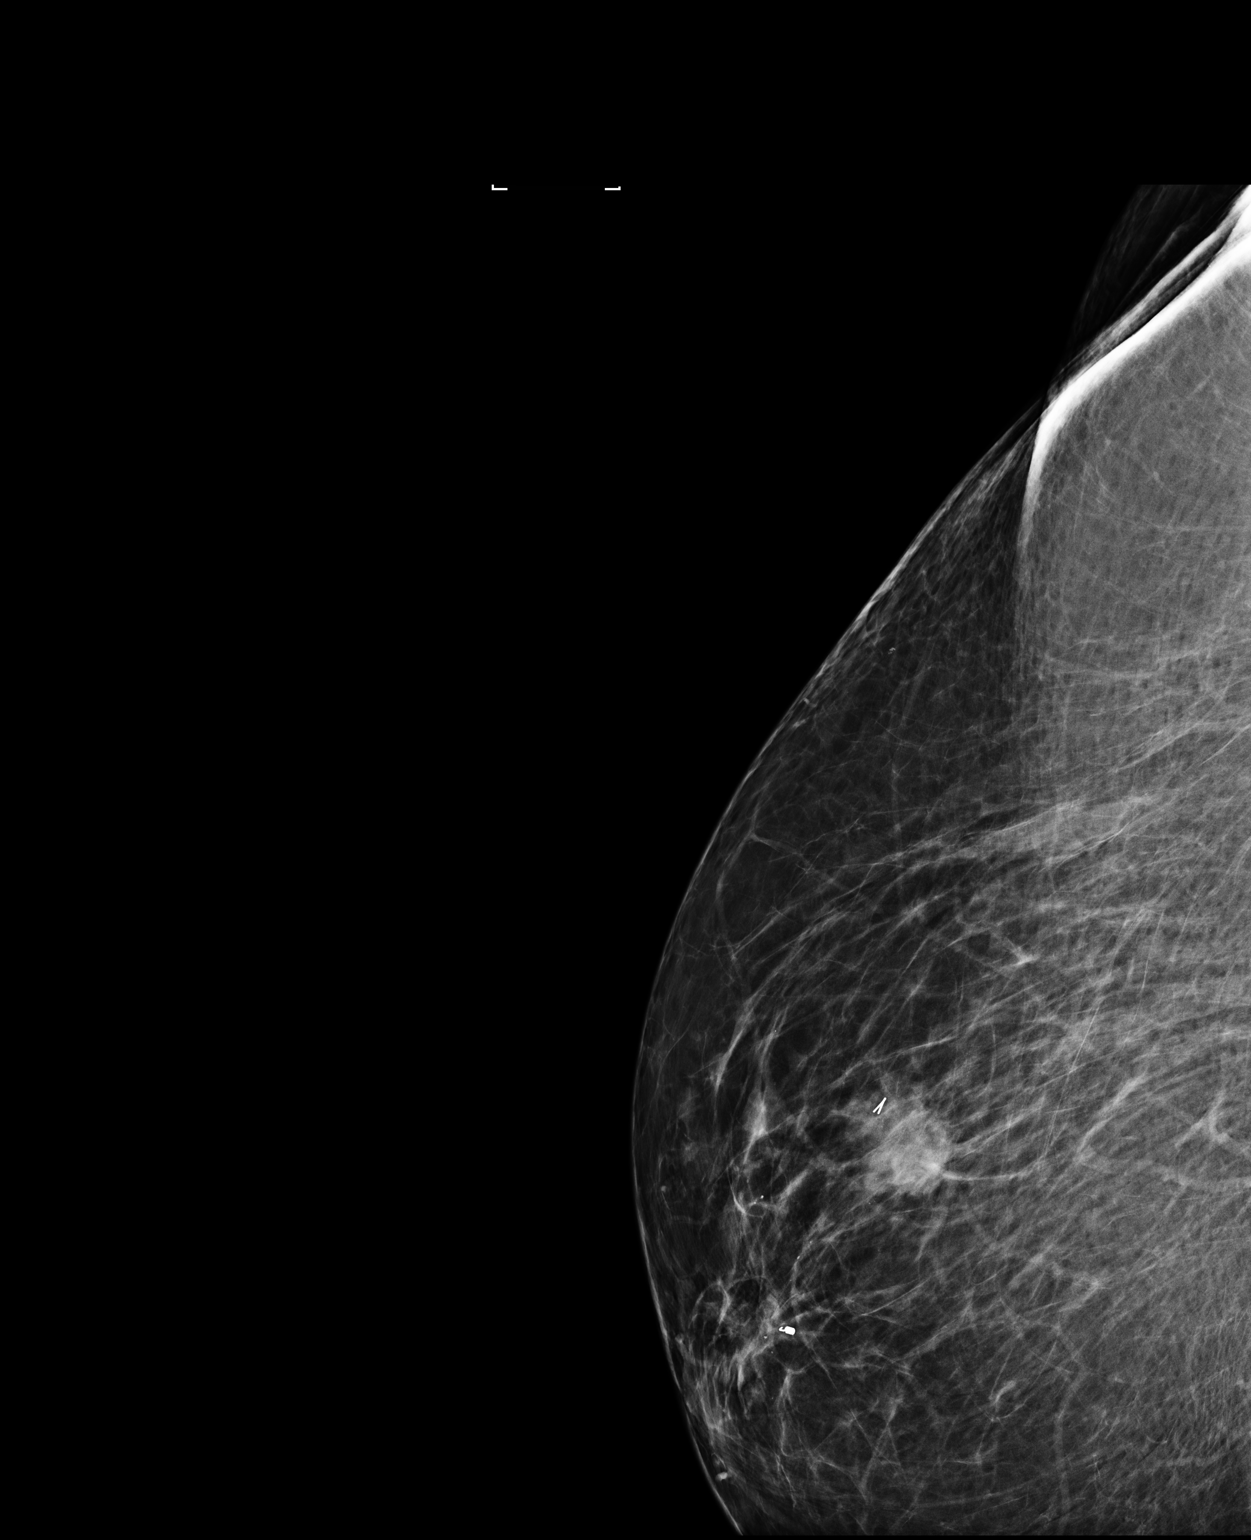

[R MLO]
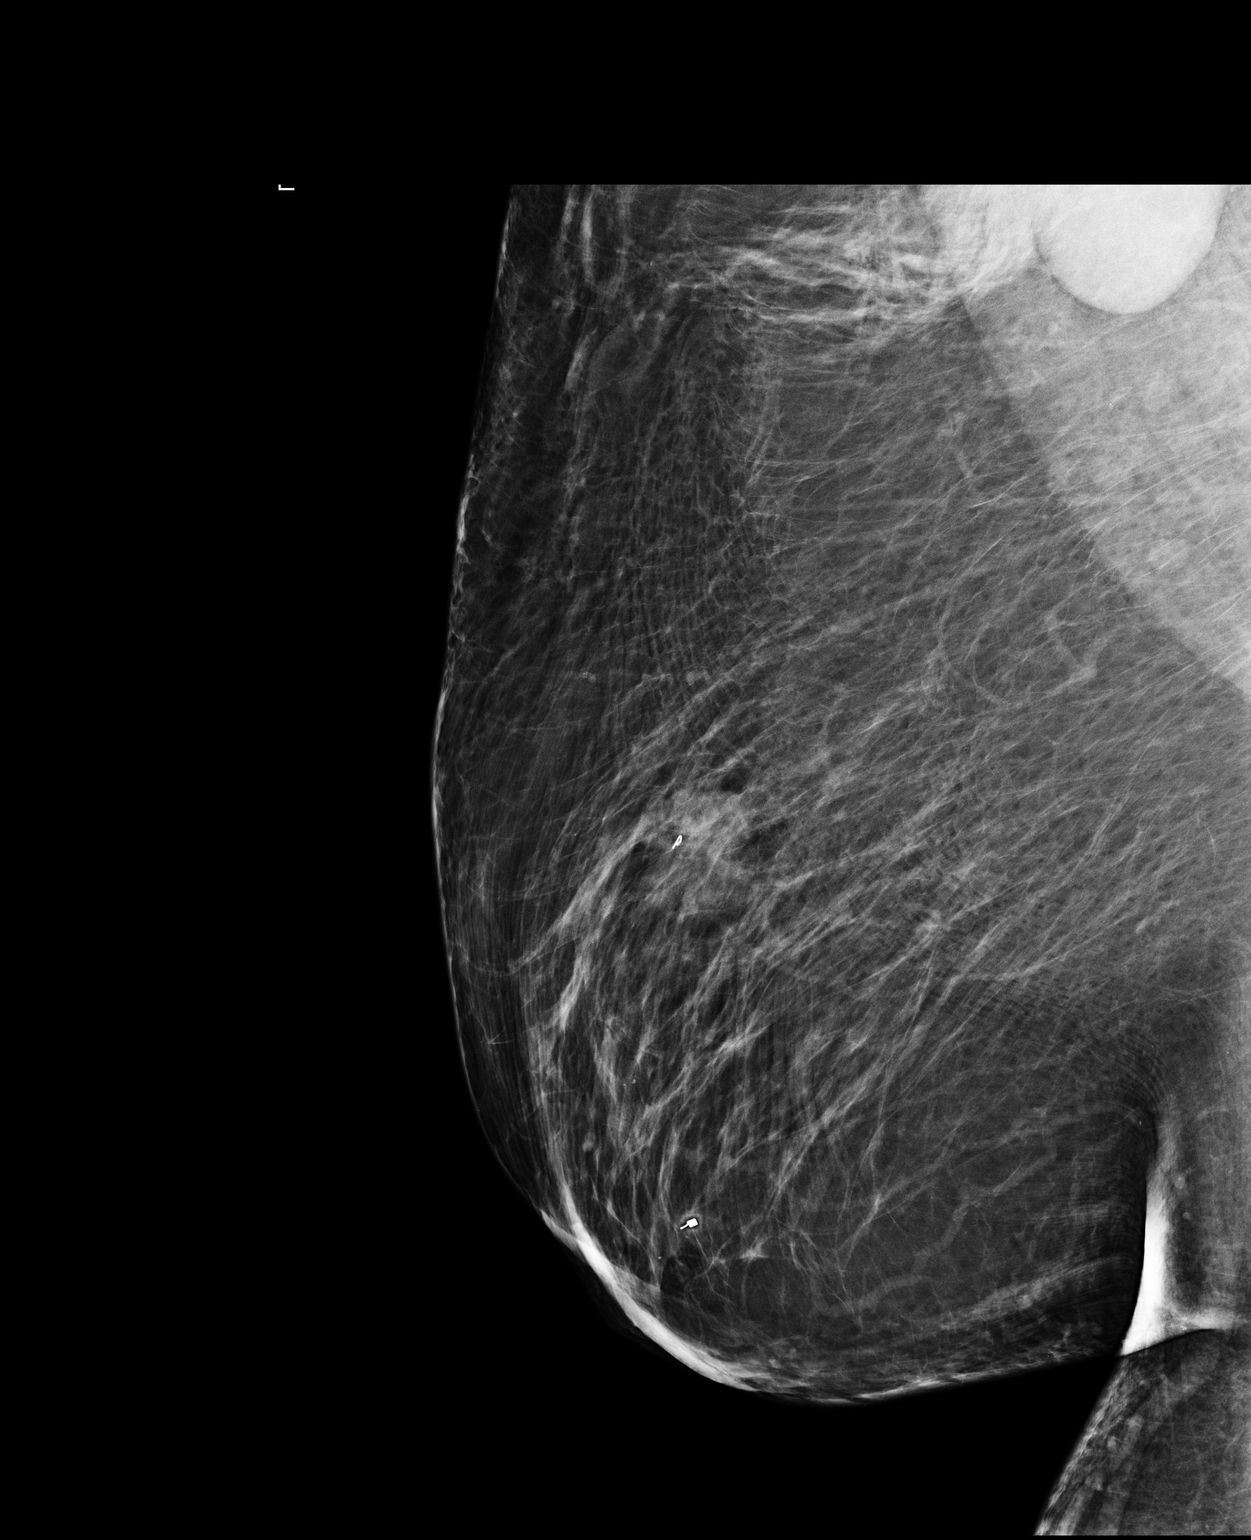

[2 of 2 positions shown; findings below may reference images not displayed]

FINDINGS: Mammographic images were obtained following right breast ultrasound
guided biopsy of abnormal lymph node in the axillary tail.
Exaggerated CC and MLO views of the left breast demonstrate
HydroMARK biopsy clip in the abnormal lymph node of concern.
IMPRESSION: Post biopsy mammogram demonstrating HydroMARK biopsy clip in the
mass of concern.

Final Assessment: Post Procedure Mammograms for Marker Placement

## 2019-02-04 ENCOUNTER — Other Ambulatory Visit: Payer: Self-pay

## 2019-02-04 ENCOUNTER — Encounter (HOSPITAL_COMMUNITY): Payer: Self-pay

## 2019-02-04 ENCOUNTER — Inpatient Hospital Stay (HOSPITAL_COMMUNITY): Payer: BLUE CROSS/BLUE SHIELD | Attending: Hematology

## 2019-02-04 DIAGNOSIS — Z170 Estrogen receptor positive status [ER+]: Secondary | ICD-10-CM | POA: Diagnosis not present

## 2019-02-04 DIAGNOSIS — C50211 Malignant neoplasm of upper-inner quadrant of right female breast: Secondary | ICD-10-CM | POA: Insufficient documentation

## 2019-02-04 DIAGNOSIS — Z452 Encounter for adjustment and management of vascular access device: Secondary | ICD-10-CM | POA: Diagnosis not present

## 2019-02-04 MED ORDER — HEPARIN SOD (PORK) LOCK FLUSH 100 UNIT/ML IV SOLN
500.0000 [IU] | Freq: Once | INTRAVENOUS | Status: AC
  Administered 2019-02-04: 10:00:00 500 [IU] via INTRAVENOUS

## 2019-02-04 MED ORDER — SODIUM CHLORIDE 0.9% FLUSH
10.0000 mL | Freq: Once | INTRAVENOUS | Status: AC
  Administered 2019-02-04: 10 mL via INTRAVENOUS

## 2019-02-04 NOTE — Progress Notes (Signed)
Renee Harris presented for Portacath access and flush. Portacath located left chest wall accessed with  H 20 needle. No blood return and no resistance met Portacath flushed with 55m NS and 500U/562mHeparin and needle removed intact. Procedure without incident. Patient tolerated procedure well.   Vitals stable and discharged home from clinic ambulatory. Follow up as scheduled.

## 2019-02-17 DIAGNOSIS — E119 Type 2 diabetes mellitus without complications: Secondary | ICD-10-CM | POA: Diagnosis not present

## 2019-02-17 DIAGNOSIS — I10 Essential (primary) hypertension: Secondary | ICD-10-CM | POA: Diagnosis not present

## 2019-02-17 DIAGNOSIS — Z6841 Body Mass Index (BMI) 40.0 and over, adult: Secondary | ICD-10-CM | POA: Diagnosis not present

## 2019-02-17 DIAGNOSIS — E7849 Other hyperlipidemia: Secondary | ICD-10-CM | POA: Diagnosis not present

## 2019-02-17 DIAGNOSIS — E1165 Type 2 diabetes mellitus with hyperglycemia: Secondary | ICD-10-CM | POA: Diagnosis not present

## 2019-03-16 ENCOUNTER — Other Ambulatory Visit (HOSPITAL_COMMUNITY): Payer: Self-pay | Admitting: Emergency Medicine

## 2019-03-16 DIAGNOSIS — Z170 Estrogen receptor positive status [ER+]: Secondary | ICD-10-CM

## 2019-03-16 DIAGNOSIS — C50211 Malignant neoplasm of upper-inner quadrant of right female breast: Secondary | ICD-10-CM

## 2019-03-17 ENCOUNTER — Inpatient Hospital Stay (HOSPITAL_COMMUNITY): Payer: BC Managed Care – PPO | Attending: Hematology

## 2019-03-17 ENCOUNTER — Other Ambulatory Visit: Payer: Self-pay

## 2019-03-17 DIAGNOSIS — R011 Cardiac murmur, unspecified: Secondary | ICD-10-CM | POA: Diagnosis not present

## 2019-03-17 DIAGNOSIS — G4730 Sleep apnea, unspecified: Secondary | ICD-10-CM | POA: Diagnosis not present

## 2019-03-17 DIAGNOSIS — Z9221 Personal history of antineoplastic chemotherapy: Secondary | ICD-10-CM | POA: Insufficient documentation

## 2019-03-17 DIAGNOSIS — Z7984 Long term (current) use of oral hypoglycemic drugs: Secondary | ICD-10-CM | POA: Diagnosis not present

## 2019-03-17 DIAGNOSIS — E785 Hyperlipidemia, unspecified: Secondary | ICD-10-CM | POA: Diagnosis not present

## 2019-03-17 DIAGNOSIS — K219 Gastro-esophageal reflux disease without esophagitis: Secondary | ICD-10-CM | POA: Insufficient documentation

## 2019-03-17 DIAGNOSIS — C773 Secondary and unspecified malignant neoplasm of axilla and upper limb lymph nodes: Secondary | ICD-10-CM | POA: Diagnosis not present

## 2019-03-17 DIAGNOSIS — Z87442 Personal history of urinary calculi: Secondary | ICD-10-CM | POA: Insufficient documentation

## 2019-03-17 DIAGNOSIS — C50211 Malignant neoplasm of upper-inner quadrant of right female breast: Secondary | ICD-10-CM | POA: Insufficient documentation

## 2019-03-17 DIAGNOSIS — Z923 Personal history of irradiation: Secondary | ICD-10-CM | POA: Insufficient documentation

## 2019-03-17 DIAGNOSIS — J45909 Unspecified asthma, uncomplicated: Secondary | ICD-10-CM | POA: Diagnosis not present

## 2019-03-17 DIAGNOSIS — I10 Essential (primary) hypertension: Secondary | ICD-10-CM | POA: Insufficient documentation

## 2019-03-17 DIAGNOSIS — G629 Polyneuropathy, unspecified: Secondary | ICD-10-CM | POA: Insufficient documentation

## 2019-03-17 DIAGNOSIS — E119 Type 2 diabetes mellitus without complications: Secondary | ICD-10-CM | POA: Diagnosis not present

## 2019-03-17 DIAGNOSIS — Z170 Estrogen receptor positive status [ER+]: Secondary | ICD-10-CM | POA: Insufficient documentation

## 2019-03-17 DIAGNOSIS — Z801 Family history of malignant neoplasm of trachea, bronchus and lung: Secondary | ICD-10-CM | POA: Diagnosis not present

## 2019-03-17 DIAGNOSIS — Z79899 Other long term (current) drug therapy: Secondary | ICD-10-CM | POA: Diagnosis not present

## 2019-03-17 DIAGNOSIS — Z79811 Long term (current) use of aromatase inhibitors: Secondary | ICD-10-CM | POA: Insufficient documentation

## 2019-03-17 DIAGNOSIS — M8580 Other specified disorders of bone density and structure, unspecified site: Secondary | ICD-10-CM | POA: Diagnosis not present

## 2019-03-17 LAB — CBC WITH DIFFERENTIAL/PLATELET
Abs Immature Granulocytes: 0.01 10*3/uL (ref 0.00–0.07)
Basophils Absolute: 0 10*3/uL (ref 0.0–0.1)
Basophils Relative: 1 %
Eosinophils Absolute: 0.2 10*3/uL (ref 0.0–0.5)
Eosinophils Relative: 4 %
HCT: 38.8 % (ref 36.0–46.0)
Hemoglobin: 12.3 g/dL (ref 12.0–15.0)
Immature Granulocytes: 0 %
Lymphocytes Relative: 34 %
Lymphs Abs: 1.2 10*3/uL (ref 0.7–4.0)
MCH: 27.4 pg (ref 26.0–34.0)
MCHC: 31.7 g/dL (ref 30.0–36.0)
MCV: 86.4 fL (ref 80.0–100.0)
Monocytes Absolute: 0.3 10*3/uL (ref 0.1–1.0)
Monocytes Relative: 8 %
Neutro Abs: 1.8 10*3/uL (ref 1.7–7.7)
Neutrophils Relative %: 53 %
Platelets: 98 10*3/uL — ABNORMAL LOW (ref 150–400)
RBC: 4.49 MIL/uL (ref 3.87–5.11)
RDW: 15 % (ref 11.5–15.5)
WBC: 3.4 10*3/uL — ABNORMAL LOW (ref 4.0–10.5)
nRBC: 0 % (ref 0.0–0.2)

## 2019-03-17 LAB — COMPREHENSIVE METABOLIC PANEL
ALT: 30 U/L (ref 0–44)
AST: 44 U/L — ABNORMAL HIGH (ref 15–41)
Albumin: 4 g/dL (ref 3.5–5.0)
Alkaline Phosphatase: 125 U/L (ref 38–126)
Anion gap: 10 (ref 5–15)
BUN: 22 mg/dL — ABNORMAL HIGH (ref 6–20)
CO2: 25 mmol/L (ref 22–32)
Calcium: 10.8 mg/dL — ABNORMAL HIGH (ref 8.9–10.3)
Chloride: 105 mmol/L (ref 98–111)
Creatinine, Ser: 0.86 mg/dL (ref 0.44–1.00)
GFR calc Af Amer: >60 mL/min (ref >60)
GFR calc non Af Amer: >60 mL/min (ref >60)
Glucose, Bld: 177 mg/dL — ABNORMAL HIGH (ref 70–99)
Potassium: 4.3 mmol/L (ref 3.5–5.1)
Sodium: 140 mmol/L (ref 135–145)
Total Bilirubin: 0.4 mg/dL (ref 0.3–1.2)
Total Protein: 8 g/dL (ref 6.5–8.1)

## 2019-03-18 ENCOUNTER — Other Ambulatory Visit (HOSPITAL_COMMUNITY): Payer: Self-pay | Admitting: Hematology

## 2019-03-24 ENCOUNTER — Ambulatory Visit (HOSPITAL_COMMUNITY): Payer: BLUE CROSS/BLUE SHIELD | Admitting: Hematology

## 2019-04-05 ENCOUNTER — Other Ambulatory Visit: Payer: Self-pay

## 2019-04-05 ENCOUNTER — Inpatient Hospital Stay (HOSPITAL_BASED_OUTPATIENT_CLINIC_OR_DEPARTMENT_OTHER): Payer: BC Managed Care – PPO | Admitting: Hematology

## 2019-04-05 ENCOUNTER — Encounter (HOSPITAL_COMMUNITY): Payer: Self-pay | Admitting: Hematology

## 2019-04-05 VITALS — BP 138/77 | HR 71 | Temp 98.2°F | Resp 18 | Wt 250.3 lb

## 2019-04-05 DIAGNOSIS — Z87442 Personal history of urinary calculi: Secondary | ICD-10-CM

## 2019-04-05 DIAGNOSIS — M8580 Other specified disorders of bone density and structure, unspecified site: Secondary | ICD-10-CM

## 2019-04-05 DIAGNOSIS — G4730 Sleep apnea, unspecified: Secondary | ICD-10-CM | POA: Diagnosis not present

## 2019-04-05 DIAGNOSIS — Z170 Estrogen receptor positive status [ER+]: Secondary | ICD-10-CM | POA: Diagnosis not present

## 2019-04-05 DIAGNOSIS — I10 Essential (primary) hypertension: Secondary | ICD-10-CM | POA: Diagnosis not present

## 2019-04-05 DIAGNOSIS — K219 Gastro-esophageal reflux disease without esophagitis: Secondary | ICD-10-CM

## 2019-04-05 DIAGNOSIS — Z79811 Long term (current) use of aromatase inhibitors: Secondary | ICD-10-CM | POA: Diagnosis not present

## 2019-04-05 DIAGNOSIS — G629 Polyneuropathy, unspecified: Secondary | ICD-10-CM | POA: Diagnosis not present

## 2019-04-05 DIAGNOSIS — Z801 Family history of malignant neoplasm of trachea, bronchus and lung: Secondary | ICD-10-CM

## 2019-04-05 DIAGNOSIS — Z79899 Other long term (current) drug therapy: Secondary | ICD-10-CM

## 2019-04-05 DIAGNOSIS — Z923 Personal history of irradiation: Secondary | ICD-10-CM | POA: Diagnosis not present

## 2019-04-05 DIAGNOSIS — E785 Hyperlipidemia, unspecified: Secondary | ICD-10-CM | POA: Diagnosis not present

## 2019-04-05 DIAGNOSIS — Z9221 Personal history of antineoplastic chemotherapy: Secondary | ICD-10-CM

## 2019-04-05 DIAGNOSIS — J45909 Unspecified asthma, uncomplicated: Secondary | ICD-10-CM | POA: Diagnosis not present

## 2019-04-05 DIAGNOSIS — C50211 Malignant neoplasm of upper-inner quadrant of right female breast: Secondary | ICD-10-CM | POA: Diagnosis not present

## 2019-04-05 DIAGNOSIS — Z7984 Long term (current) use of oral hypoglycemic drugs: Secondary | ICD-10-CM

## 2019-04-05 DIAGNOSIS — C773 Secondary and unspecified malignant neoplasm of axilla and upper limb lymph nodes: Secondary | ICD-10-CM | POA: Diagnosis not present

## 2019-04-05 DIAGNOSIS — R011 Cardiac murmur, unspecified: Secondary | ICD-10-CM

## 2019-04-05 DIAGNOSIS — E119 Type 2 diabetes mellitus without complications: Secondary | ICD-10-CM

## 2019-04-05 NOTE — Progress Notes (Signed)
[place], [place] [address]   CLINIC:  Medical Oncology/Hematology  PCP:  Sharilyn Sites, [place] [address] [phone]   REASON FOR VISIT:  Follow-up for Breast Ca   CURRENT THERAPY: Clinical Surveillance   BRIEF ONCOLOGIC HISTORY:  Oncology History  Malignant neoplasm of upper-inner quadrant of right breast in female, estrogen receptor positive ([place])  04/13/2017 Initial Diagnosis   Screening detected right breast mass with calcifications 2.1 cm in size and 3.4 cm in size. Calcifications were fibroadenoma. Right breast biopsy upper inner quadrant 1:00 mass: IDC with DCIS with necrosis, grade 3, ER 100%, PR 70%, Ki-67 60%, HER-2 positive ratio 2.34, T2 N1 stage IB (New AJCC)   04/25/2017 Breast MRI   Right breast upper inner quadrant 2.2 x 1.7 x 2.7 cm mass, abnormal area of clumped non-mass enhancement in the right lateral breast 3.2 x 1.9 x 1 cm, too abnormal lymph nodes right axilla 4.3 and 1.7 cm (biopsy-proven breast cancer)    05/08/2017 Procedure   Right axilla lymph node biopsy: Metastatic carcinoma   05/13/2017 Procedure   Right breast biopsy retroareolar region: Intraductal papilloma   01/20/2018 -  Chemotherapy   The patient had ado-trastuzumab emtansine (KADCYLA) 420 mg in sodium chloride 0.9 % 250 mL chemo infusion, 3.6 mg/kg = 420 mg, Intravenous, Once, 14 of 14 cycles Administration: 420 mg (01/20/2018), 420 mg (02/10/2018), 420 mg (04/14/2018), 420 mg (03/03/2018), 420 mg (03/24/2018), 420 mg (05/05/2018), 420 mg (05/26/2018), 420 mg (06/16/2018), 420 mg (07/08/2018), 420 mg (07/29/2018), 420 mg (08/19/2018), 420 mg (09/13/2018), 420 mg (10/08/2018), 420 mg (11/04/2018)  for chemotherapy treatment.       CANCER STAGING: Cancer Staging Malignant neoplasm of upper-inner quadrant of right breast in female, estrogen receptor positive ([place]) Staging form: Breast, AJCC 8th Edition - Clinical stage from 04/22/2017:  Stage IB (cT2, cN0, cM0, G3, ER: Positive, PR: Positive, HER2: Positive) - Unsigned    INTERVAL HISTORY:  Renee Harris 58 y.o. female presents today for follow up. Reports overall doing well. Denies any changes in her breast. No new lumps, mass, or nipple discharge. R nipple inversion has been present since her surgery. She denies any significant fatigue. She is currently on Anastrozole daily, tolerating well.   Denies any hot flashes, myalgias.  Denies any bleeding per rectum or melena.  Denies any easy bruising or nosebleeds.  No change in bowel habits noted.  No recent ER visits or hospitalizations.  Appetite is 100%.  Energy levels are 50%.  REVIEW OF SYSTEMS:  Review of Systems  Constitutional: Positive for fatigue.  Psychiatric/Behavioral: Positive for sleep disturbance.  All other systems reviewed and are negative.    PAST MEDICAL/SURGICAL HISTORY:  Past Medical History:  Diagnosis Date  . Asthma   . Blood transfusion    as child  . Breast cancer ([facility]) 2018  . Cancer ([facility])   . Diabetes ([facility])    type 2   . GERD (gastroesophageal reflux disease)   . Headache   . Heart murmur    d/t aortic stenosis   . History of kidney stones   . Hyperlipidemia   . Hypertension   . Normal echocardiogram   . Personal history of chemotherapy 2018  . Personal history of radiation therapy 2018  . Sleep apnea    Past Surgical History:  Procedure Laterality Date  . ABDOMINAL HYSTERECTOMY  2004  . BREAST LUMPECTOMY Right 2018  . BREAST LUMPECTOMY WITH RADIOACTIVE SEED AND  SENTINEL LYMPH NODE BIOPSY Right 12/09/2017   Procedure: RIGHT BREAST LUMPECTOMY WITH RADIOACTIVE SEED AND SENTINEL LYMPH NODE BIOPSY AND RADIOACTIVE SEED TARGETED RIGHT AXILLARY LYMPH NODE EXCISION;  Surgeon: Alphonsa Overall, MD;  Location: [facility];  Service: General;  Laterality: Right;  . CHOLECYSTECTOMY  05/30/2011   Procedure: LAPAROSCOPIC CHOLECYSTECTOMY;  Surgeon: Donato Heinz;  Location: [facility];  Service: General;   Laterality: N/A;  . 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 KIDNEY STONE SURGERY     2017 2-23  . PORTACATH PLACEMENT N/A 05/25/2017   Procedure: INSERTION PORT-A-CATH;  Surgeon: Alphonsa Overall, MD;  Location: [facility];  Service: General;  Laterality: N/A;     SOCIAL HISTORY:  Social History   Socioeconomic History  . Marital status: Married    Spouse name: Not on file  . Number of children: Not on file  . Years of education: Not on file  . Highest education level: Not on file  Occupational History  . Not on file  Social Needs  . Financial resource strain: Not on file  . Food insecurity    Worry: Not on file    Inability: Not on file  . Transportation needs    Medical: Not on file    Non-medical: Not on file  Tobacco Use  . Smoking status: Never Smoker  . Smokeless tobacco: Never Used  Substance and Sexual Activity  . Alcohol use: Yes    Comment: 2 x year  . Drug use: No  . Sexual activity: Yes    Birth control/protection: Surgical  Lifestyle  . Physical activity    Days per week: Not on file    Minutes per session: Not on file  . Stress: Not on file  Relationships  . Social Herbalist on phone: Not on file    Gets together: Not on file    Attends religious service: Not on file    Active member of club or organization: Not on file    Attends meetings of clubs or organizations: Not on file    Relationship status: Not on file  . Intimate partner violence    Fear of current or ex partner: No    Emotionally abused: No    Physically abused: No    Forced sexual activity: No  Other Topics Concern  . Not on file  Social History Narrative  . Not on file    FAMILY HISTORY:  Family History  Problem Relation Age of Onset  . Lung cancer Maternal Grandmother   . Anesthesia problems Neg Hx   . Hypotension Neg Hx   . Malignant hyperthermia Neg Hx   . Pseudochol deficiency Neg Hx     CURRENT MEDICATIONS:  Outpatient Encounter  Medications as of 04/05/2019  Medication Sig  . acetaminophen (TYLENOL) 500 MG tablet Take 1,000 mg by mouth daily as needed for moderate pain or headache.  . albuterol (PROVENTIL HFA;VENTOLIN HFA) 108 (90 BASE) MCG/ACT inhaler Inhale 1 puff into the lungs every 6 (six) hours as needed for wheezing or shortness of breath.   . anastrozole (ARIMIDEX) 1 MG tablet TAKE ONE TABLET BY MOUTH ONCE DAILY.  Marland Kitchen escitalopram (LEXAPRO) 20 MG tablet Take 10 mg by mouth at bedtime.   . fenofibrate 160 MG tablet Take 160 mg by mouth at bedtime.   . gabapentin (NEURONTIN) 600 MG tablet Take 600 mg by mouth 3 (three) times daily.  Marland Kitchen lidocaine-prilocaine (EMLA) cream  Apply 1 application topically daily as needed (port access).  Marland Kitchen losartan (COZAAR) 50 MG tablet Take 25 mg by mouth at bedtime.   . metFORMIN (GLUCOPHAGE) 500 MG tablet Take 500 mg by mouth 2 (two) times daily with a meal.  . Multiple Vitamin (MULTIVITAMIN) tablet Take 1 tablet by mouth at bedtime.   Marland Kitchen omeprazole (PRILOSEC) 40 MG capsule Take 1 capsule (40 mg total) by mouth daily.  . Skin Protectants, Misc. (EUCERIN) cream Apply 1 application topically as needed for dry skin.  Marland Kitchen VASCEPA 1 g CAPS TAKE (2) CAPSULES BY MOUTH TWICE DAILY.  . diphenhydrAMINE (BENADRYL) 25 mg capsule Take 25 mg by mouth at bedtime.   . meclizine (ANTIVERT) 25 MG tablet Take 1 tablet (25 mg total) by mouth 3 (three) times daily as needed for dizziness. (Patient not taking: Reported on 04/05/2019)   No facility-administered encounter medications on file as of 04/05/2019.     ALLERGIES:  Allergies  Allergen Reactions  . Latex Itching  . Pyridostigmine Bromide Other (See Comments)    Makes muscles twitch  . Statins Nausea Only     PHYSICAL EXAM:  ECOG Performance status: 2  Vitals:   04/05/19 1046  BP: 138/77  Pulse: 71  Resp: 18  Temp: 98.2 F (36.8 C)  SpO2: 99%   Filed Weights   04/05/19 1046  Weight: 250 lb 4.8 oz (113.5 kg)    Physical Exam  Constitutional:      Appearance: Normal appearance. She is obese.  HENT:     Head: Normocephalic.     Mouth/Throat:     Mouth: Mucous membranes are moist.     Pharynx: Oropharynx is clear.  Eyes:     Extraocular Movements: Extraocular movements intact.     Conjunctiva/sclera: Conjunctivae normal.  Neck:     Musculoskeletal: Normal range of motion.  Cardiovascular:     Rate and Rhythm: Normal rate and regular rhythm.  Pulmonary:     Effort: Pulmonary effort is normal.     Breath sounds: Normal breath sounds.  Abdominal:     General: Bowel sounds are normal.     Palpations: Abdomen is soft.  Musculoskeletal: Normal range of motion.  Skin:    General: Skin is warm and dry.  Neurological:     General: No focal deficit present.     Mental Status: She is alert and oriented to person, place, and time.  Psychiatric:        Mood and Affect: Mood normal.        Behavior: Behavior normal.        Thought Content: Thought content normal.        Judgment: Judgment normal.      LABORATORY DATA:  I have reviewed the labs as listed.  CBC    Component Value Date/Time   WBC 3.4 (L) 03/17/2019 1207   RBC 4.49 03/17/2019 1207   HGB 12.3 03/17/2019 1207   HGB 13.8 04/22/2017 0831   HCT 38.8 03/17/2019 1207   HCT 41.9 04/22/2017 0831   PLT 98 (L) 03/17/2019 1207   PLT 184 04/22/2017 0831   MCV 86.4 03/17/2019 1207   MCV 85.7 04/22/2017 0831   MCH 27.4 03/17/2019 1207   MCHC 31.7 03/17/2019 1207   RDW 15.0 03/17/2019 1207   RDW 13.9 04/22/2017 0831   LYMPHSABS 1.2 03/17/2019 1207   LYMPHSABS 2.1 04/22/2017 0831   MONOABS 0.3 03/17/2019 1207   MONOABS 0.5 04/22/2017 0831   EOSABS 0.2 03/17/2019 1207  EOSABS 0.3 04/22/2017 0831   BASOSABS 0.0 03/17/2019 1207   BASOSABS 0.0 04/22/2017 0831   CMP Latest Ref Rng & Units 03/17/2019 11/25/2018 11/04/2018  Glucose 70 - 99 mg/dL 177(H) 221(H) 266(H)  BUN 6 - 20 mg/dL 22(H) 18 14  Creatinine 0.44 - 1.00 mg/dL 0.86 0.94 0.87  Sodium 135  - 145 mmol/L 140 140 140  Potassium 3.5 - 5.1 mmol/L 4.3 4.1 4.2  Chloride 98 - 111 mmol/L 105 106 107  CO2 22 - 32 mmol/L '25 26 25  ' Calcium 8.9 - 10.3 mg/dL 10.8(H) 10.8(H) 10.4(H)  Total Protein 6.5 - 8.1 g/dL 8.0 7.8 7.1  Total Bilirubin 0.3 - 1.2 mg/dL 0.4 0.7 0.8  Alkaline Phos 38 - 126 U/L 125 102 94  AST 15 - 41 U/L 44(H) 48(H) 40  ALT 0 - 44 U/L '30 23 22    ' Radiology: -I have personally reviewed her scans.   ASSESSMENT & PLAN:   Malignant neoplasm of upper-inner quadrant of right breast in female, estrogen receptor positive ([place]) 1.  Clinical T2N0 HER-2 positive right breast cancer: -Biopsy of the right breast upper inner quadrant 1:00 mass, IDC, ER/PR positive, HER-2 positive, Ki-67 of 60% -Neoadjuvant chemotherapy with 6 cycles of TCHP from 05/29/2017 through 10/09/2017 -Right lumpectomy and lymph node biopsy on 12/09/2017, with lumpectomy #1 specimen with no malignancy, lumpectomy #2 specimen showing 2 cm IDC, grade 3, 1 out of 6 lymph nodes positive for malignancy.  (yPT1cyPN1a) - Based on KATHRINE trial, started on Kadcyla 3.6 mg/kg for 14 cycles started on 01/20/2018, tolerating it very well. -14 cycles of Kadcyla from 01/20/2018 through 11/04/2018. - She could not tolerate letrozole and developed chest pain and right lateral abdominal pain.  She was switched to anastrozole on 05/05/2018 which she is tolerating well. -Last mammogram on 05/04/2018 shows BI-RADS Category 2.  - Today's physical examination shows lumpectomy scar in the right breast upper outer quadrant slightly thickened stable.  Right breast lymphedema present.  No other palpable masses.  Surgical site has slight tenderness.  No palpable adenopathy. -She is scheduled for diagnostic mammogram next month. - RTC in 6 mths.   2.  Peripheral neuropathy: -She is continuing gabapentin 600 mg 3 times a day, started by her PMD.  This has been stable in the feet.    3.  Mild hypercalcemia: -He has mild hypercalcemia  dating back to 2012.  She is taking vitamin D but does not take calcium supplements because of kidney stone.  PTH was in the normal range at 35.  4.  Osteopenia: - DEXA scan on 08/25/2018 shows T score of -1.5. -She is continuing vitamin D supplements.      Orders placed this encounter:  Orders Placed This Encounter  Procedures  . CBC with Differential  . Comprehensive metabolic panel  . Vitamin D 1,25 dihydroxy      Derek Jack, MD [facility] [phone]

## 2019-04-05 NOTE — Assessment & Plan Note (Addendum)
1.  Clinical T2N0 HER-2 positive right breast cancer: -Biopsy of the right breast upper inner quadrant 1:00 mass, IDC, ER/PR positive, HER-2 positive, Ki-67 of 60% -Neoadjuvant chemotherapy with 6 cycles of TCHP from 05/29/2017 through 10/09/2017 -Right lumpectomy and lymph node biopsy on 12/09/2017, with lumpectomy #1 specimen with no malignancy, lumpectomy #2 specimen showing 2 cm IDC, grade 3, 1 out of 6 lymph nodes positive for malignancy.  (yPT1cyPN1a) - Based on KATHRINE trial, started on Kadcyla 3.6 mg/kg for 14 cycles started on 01/20/2018, tolerating it very well. -14 cycles of Kadcyla from 01/20/2018 through 11/04/2018. - She could not tolerate letrozole and all of chest pain and right lateral abdominal pain.  She was switched to anastrozole on 05/05/2018 and she is tolerating well.   -Mammogram on 05/04/2018 shows BI-RADS Category 2.   - Today's physical exam shows lumpectomy scar in the right breast upper outer quadrant, thickened and stable.  Right breast lymphedema is also stable.  Left breast has no palpable masses.  Right nipple is inverted since surgery and stable.  No palpable adenopathy.  -She is scheduled for mammogram next month. - RTC in 6 mths.   2.  Peripheral neuropathy: -She is continuing gabapentin 600 mg 3 times a day.  Neuropathy is stable.  3.  Mild hypercalcemia: -She has mild hypercalcemia dating back to 2012.  She is continuing vitamin D but does not take calcium supplements because of kidney stones. -PTH was in the normal range at 35.  4.  Osteopenia: - DEXA scan on 08/25/2018 shows T score of -1.5. -She is continuing vitamin D supplements.  5.  Mild to moderate thrombocytopenia: -She had mild to moderate decrease in platelet count since August 2018. -CT scan in May 2019 shows normal-sized spleen with fatty liver. -This could be immune mediated thrombocytopenia.  We will closely monitor it.

## 2019-04-18 ENCOUNTER — Other Ambulatory Visit (HOSPITAL_COMMUNITY): Payer: Self-pay | Admitting: Hematology

## 2019-05-03 ENCOUNTER — Other Ambulatory Visit (HOSPITAL_COMMUNITY): Payer: Self-pay | Admitting: Nurse Practitioner

## 2019-05-03 DIAGNOSIS — Z170 Estrogen receptor positive status [ER+]: Secondary | ICD-10-CM

## 2019-05-03 DIAGNOSIS — C50211 Malignant neoplasm of upper-inner quadrant of right female breast: Secondary | ICD-10-CM

## 2019-05-05 ENCOUNTER — Inpatient Hospital Stay (HOSPITAL_COMMUNITY): Payer: BC Managed Care – PPO | Attending: Hematology

## 2019-05-05 ENCOUNTER — Encounter (HOSPITAL_COMMUNITY): Payer: BLUE CROSS/BLUE SHIELD

## 2019-05-05 ENCOUNTER — Other Ambulatory Visit: Payer: Self-pay

## 2019-05-05 DIAGNOSIS — Z79811 Long term (current) use of aromatase inhibitors: Secondary | ICD-10-CM | POA: Insufficient documentation

## 2019-05-05 DIAGNOSIS — C773 Secondary and unspecified malignant neoplasm of axilla and upper limb lymph nodes: Secondary | ICD-10-CM | POA: Diagnosis not present

## 2019-05-05 DIAGNOSIS — Z170 Estrogen receptor positive status [ER+]: Secondary | ICD-10-CM | POA: Diagnosis not present

## 2019-05-05 DIAGNOSIS — C50211 Malignant neoplasm of upper-inner quadrant of right female breast: Secondary | ICD-10-CM | POA: Insufficient documentation

## 2019-05-05 DIAGNOSIS — Z452 Encounter for adjustment and management of vascular access device: Secondary | ICD-10-CM | POA: Insufficient documentation

## 2019-05-05 MED ORDER — HEPARIN SOD (PORK) LOCK FLUSH 100 UNIT/ML IV SOLN
500.0000 [IU] | Freq: Once | INTRAVENOUS | Status: AC
  Administered 2019-05-05: 500 [IU] via INTRAVENOUS

## 2019-05-05 MED ORDER — SODIUM CHLORIDE 0.9% FLUSH
10.0000 mL | Freq: Once | INTRAVENOUS | Status: AC
  Administered 2019-05-05: 10 mL via INTRAVENOUS

## 2019-05-05 NOTE — Patient Instructions (Signed)
[facility] at [facility]  Discharge Instructions:  Portacath flushed today. _______________________________________________________________  Thank you for choosing [facility] at [facility] to provide your oncology and hematology care.  To afford each patient quality time with our providers, please arrive at least 15 minutes before your scheduled appointment.  You need to re-schedule your appointment if you arrive 10 or more minutes late.  We strive to give you quality time with our providers, and arriving late affects you and other patients whose appointments are after yours.  Also, if you no show three or more times for appointments you may be dismissed from the clinic.  Again, thank you for choosing [facility] at [facility]. Our hope is that these requests will allow you access to exceptional care and in a timely manner. _______________________________________________________________  If you have questions after your visit, please contact our office at [phone] between the hours of 8:30 a.m. and 5:00 p.m. Voicemails left after 4:30 p.m. will not be returned until the following business day. _______________________________________________________________  For prescription refill requests, have your pharmacy contact our office. _______________________________________________________________  Recommendations made by the consultant and any test results will be sent to your referring physician. _______________________________________________________________ 

## 2019-05-10 ENCOUNTER — Ambulatory Visit (HOSPITAL_COMMUNITY): Payer: BC Managed Care – PPO

## 2019-05-10 ENCOUNTER — Ambulatory Visit (HOSPITAL_COMMUNITY): Admission: RE | Admit: 2019-05-10 | Payer: BC Managed Care – PPO | Source: Ambulatory Visit

## 2019-05-17 ENCOUNTER — Other Ambulatory Visit (HOSPITAL_COMMUNITY): Payer: Self-pay | Admitting: Cardiology

## 2019-05-24 ENCOUNTER — Ambulatory Visit (HOSPITAL_COMMUNITY): Admission: RE | Admit: 2019-05-24 | Payer: BC Managed Care – PPO | Source: Ambulatory Visit

## 2019-05-24 ENCOUNTER — Telehealth (HOSPITAL_COMMUNITY): Payer: Self-pay

## 2019-05-24 ENCOUNTER — Ambulatory Visit (HOSPITAL_COMMUNITY): Payer: BC Managed Care – PPO

## 2019-05-24 ENCOUNTER — Other Ambulatory Visit: Payer: Self-pay

## 2019-05-24 ENCOUNTER — Ambulatory Visit (HOSPITAL_COMMUNITY)
Admission: RE | Admit: 2019-05-24 | Discharge: 2019-05-24 | Disposition: A | Discharge status: Home / Self Care | Payer: BC Managed Care – PPO | Source: Ambulatory Visit | Attending: Nurse Practitioner | Admitting: Nurse Practitioner

## 2019-05-24 DIAGNOSIS — Z170 Estrogen receptor positive status [ER+]: Secondary | ICD-10-CM | POA: Insufficient documentation

## 2019-05-24 DIAGNOSIS — C50211 Malignant neoplasm of upper-inner quadrant of right female breast: Secondary | ICD-10-CM | POA: Diagnosis not present

## 2019-05-24 DIAGNOSIS — R928 Other abnormal and inconclusive findings on diagnostic imaging of breast: Secondary | ICD-10-CM | POA: Diagnosis not present

## 2019-05-24 NOTE — Telephone Encounter (Signed)
Prior authorization through [place] was initiated for Vascepa medication and sent via [place] on 05/24/2019.

## 2019-05-24 NOTE — Telephone Encounter (Signed)
Prior authorization through [place] was APPROVED for [employer] and will expire on 05/22/20.

## 2019-07-12 DIAGNOSIS — I10 Essential (primary) hypertension: Secondary | ICD-10-CM | POA: Diagnosis not present

## 2019-07-12 DIAGNOSIS — Z23 Encounter for immunization: Secondary | ICD-10-CM | POA: Diagnosis not present

## 2019-07-12 DIAGNOSIS — E7849 Other hyperlipidemia: Secondary | ICD-10-CM | POA: Diagnosis not present

## 2019-07-12 DIAGNOSIS — E119 Type 2 diabetes mellitus without complications: Secondary | ICD-10-CM | POA: Diagnosis not present

## 2019-07-12 DIAGNOSIS — Z6841 Body Mass Index (BMI) 40.0 and over, adult: Secondary | ICD-10-CM | POA: Diagnosis not present

## 2019-07-19 ENCOUNTER — Telehealth (HOSPITAL_COMMUNITY): Payer: Self-pay | Admitting: Vascular Surgery

## 2019-07-19 NOTE — Telephone Encounter (Signed)
Left pt message to make 1 yr echo f/u w/ DM in [place]

## 2019-07-28 ENCOUNTER — Other Ambulatory Visit (HOSPITAL_COMMUNITY): Payer: Self-pay | Admitting: Internal Medicine

## 2019-07-28 ENCOUNTER — Encounter (HOSPITAL_COMMUNITY): Payer: Self-pay

## 2019-07-28 ENCOUNTER — Inpatient Hospital Stay (HOSPITAL_COMMUNITY): Payer: BC Managed Care – PPO | Attending: Hematology

## 2019-07-28 ENCOUNTER — Other Ambulatory Visit: Payer: Self-pay

## 2019-07-28 ENCOUNTER — Other Ambulatory Visit (HOSPITAL_COMMUNITY): Payer: BC Managed Care – PPO

## 2019-07-28 DIAGNOSIS — Z170 Estrogen receptor positive status [ER+]: Secondary | ICD-10-CM | POA: Insufficient documentation

## 2019-07-28 DIAGNOSIS — C50211 Malignant neoplasm of upper-inner quadrant of right female breast: Secondary | ICD-10-CM | POA: Diagnosis not present

## 2019-07-28 LAB — CBC WITH DIFFERENTIAL/PLATELET
Abs Immature Granulocytes: 0.02 10*3/uL (ref 0.00–0.07)
Basophils Absolute: 0 10*3/uL (ref 0.0–0.1)
Basophils Relative: 1 %
Eosinophils Absolute: 0.1 10*3/uL (ref 0.0–0.5)
Eosinophils Relative: 4 %
HCT: 39.4 % (ref 36.0–46.0)
Hemoglobin: 12.8 g/dL (ref 12.0–15.0)
Immature Granulocytes: 1 %
Lymphocytes Relative: 30 %
Lymphs Abs: 1.1 10*3/uL (ref 0.7–4.0)
MCH: 28.5 pg (ref 26.0–34.0)
MCHC: 32.5 g/dL (ref 30.0–36.0)
MCV: 87.8 fL (ref 80.0–100.0)
Monocytes Absolute: 0.3 10*3/uL (ref 0.1–1.0)
Monocytes Relative: 8 %
Neutro Abs: 2 10*3/uL (ref 1.7–7.7)
Neutrophils Relative %: 56 %
Platelets: 109 10*3/uL — ABNORMAL LOW (ref 150–400)
RBC: 4.49 MIL/uL (ref 3.87–5.11)
RDW: 14.3 % (ref 11.5–15.5)
WBC: 3.5 10*3/uL — ABNORMAL LOW (ref 4.0–10.5)
nRBC: 0 % (ref 0.0–0.2)

## 2019-07-28 LAB — COMPREHENSIVE METABOLIC PANEL
ALT: 40 U/L (ref 0–44)
AST: 43 U/L — ABNORMAL HIGH (ref 15–41)
Albumin: 3.9 g/dL (ref 3.5–5.0)
Alkaline Phosphatase: 124 U/L (ref 38–126)
Anion gap: 11 (ref 5–15)
BUN: 25 mg/dL — ABNORMAL HIGH (ref 6–20)
CO2: 25 mmol/L (ref 22–32)
Calcium: 10.8 mg/dL — ABNORMAL HIGH (ref 8.9–10.3)
Chloride: 103 mmol/L (ref 98–111)
Creatinine, Ser: 0.94 mg/dL (ref 0.44–1.00)
GFR calc Af Amer: >60 mL/min (ref >60)
GFR calc non Af Amer: >60 mL/min (ref >60)
Glucose, Bld: 242 mg/dL — ABNORMAL HIGH (ref 70–99)
Potassium: 4.2 mmol/L (ref 3.5–5.1)
Sodium: 139 mmol/L (ref 135–145)
Total Bilirubin: 0.5 mg/dL (ref 0.3–1.2)
Total Protein: 7.6 g/dL (ref 6.5–8.1)

## 2019-07-28 MED ORDER — SODIUM CHLORIDE 0.9% FLUSH
10.0000 mL | Freq: Once | INTRAVENOUS | Status: AC
  Administered 2019-07-28: 10 mL via INTRAVENOUS

## 2019-07-28 MED ORDER — HEPARIN SOD (PORK) LOCK FLUSH 100 UNIT/ML IV SOLN
500.0000 [IU] | Freq: Once | INTRAVENOUS | Status: AC
  Administered 2019-07-28: 500 [IU] via INTRAVENOUS

## 2019-07-28 NOTE — Progress Notes (Signed)
Patients port flushed without difficulty.  Good blood return noted with no bruising or swelling noted at site.  Band aid applied.  VSS with discharge and left ambulatory with no s/s of distress noted.  

## 2019-07-28 NOTE — Telephone Encounter (Signed)
Refill request.  New order, discontinuing this duplicate will set up future request to [facility].

## 2019-08-04 ENCOUNTER — Other Ambulatory Visit: Payer: Self-pay

## 2019-08-04 ENCOUNTER — Inpatient Hospital Stay (HOSPITAL_BASED_OUTPATIENT_CLINIC_OR_DEPARTMENT_OTHER): Payer: BC Managed Care – PPO | Admitting: Hematology

## 2019-08-04 ENCOUNTER — Encounter (HOSPITAL_COMMUNITY): Payer: Self-pay | Admitting: Hematology

## 2019-08-04 DIAGNOSIS — C50211 Malignant neoplasm of upper-inner quadrant of right female breast: Secondary | ICD-10-CM | POA: Diagnosis not present

## 2019-08-04 DIAGNOSIS — G9009 Other idiopathic peripheral autonomic neuropathy: Secondary | ICD-10-CM

## 2019-08-04 DIAGNOSIS — Z170 Estrogen receptor positive status [ER+]: Secondary | ICD-10-CM

## 2019-08-04 NOTE — Progress Notes (Signed)
Pt is taking Arimidex as prescribed with no side effects. 

## 2019-08-04 NOTE — Patient Instructions (Signed)
[facility] at [facility]  Discharge Instructions:  You spoke to Harriet Pho, NP, today. _______________________________________________________________  Thank you for choosing [facility] at [facility] to provide your oncology and hematology care.  To afford each patient quality time with our providers, please arrive at least 15 minutes before your scheduled appointment.  You need to re-schedule your appointment if you arrive 10 or more minutes late.  We strive to give you quality time with our providers, and arriving late affects you and other patients whose appointments are after yours.  Also, if you no show three or more times for appointments you may be dismissed from the clinic.  Again, thank you for choosing [facility] at [facility] hope is that these requests will allow you access to exceptional care and in a timely manner. _______________________________________________________________  If you have questions after your visit, please contact our office at [phone] between the hours of 8:30 a.m. and 5:00 p.m. Voicemails left after 4:30 p.m. will not be returned until the following business day. _______________________________________________________________  For prescription refill requests, have your pharmacy contact our office. _______________________________________________________________  Recommendations made by the consultant and any test results will be sent to your referring physician. _______________________________________________________________

## 2019-08-05 NOTE — Progress Notes (Signed)
   [place], [place] [address]    I connected with Renee Harris on 08/04/19 at 10:30 AM EDT by telephone visit and verified that I am speaking with the correct person using two identifiers.   I discussed the limitations, risks, security and privacy concerns of performing an evaluation and management service by telemedicine and the availability of in-person appointments. I also discussed with the patient that there may be a patient responsible charge related to this service. The patient expressed understanding and agreed to proceed.     Patient's location: home Provider's location: [facility]   Chief Complaint: Breast Cancer    HPI: Patient reports overall doing well.  She denies any significant fatigue.  She denies any changes in her health history since her last office visit.  She continues on anastrozole, tolerating well.  She denies any change in bowel habits.  No weight loss.  Appetite is stable.  Denies any changes in her breasts.  No new masses, lumps.  We will discuss her most recent mammogram and labs.  Assessment and Plan: 1.  Clinical T2N0 HER-2 positive right breast cancer: -Biopsy of the right breast upper inner quadrant 1:00 mass, IDC, ER/PR positive, HER-2 positive, Ki-67 of 60% -Neoadjuvant chemotherapy with 6 cycles of TCHP from 05/29/2017 through 10/09/2017 -Right lumpectomy and lymph node biopsy on 12/09/2017, with lumpectomy #1 specimen with no malignancy, lumpectomy #2 specimen showing 2 cm IDC, grade 3, 1 out of 6 lymph nodes positive for malignancy.  (yPT1cyPN1a) - Based on KATHRINE trial, started on Kadcyla 3.6 mg/kg for 14 cycles started on 01/20/2018, tolerating it very well. -14 cycles of Kadcyla from 01/20/2018 through 11/04/2018. - She could not tolerate letrozole and all of chest pain and right lateral abdominal pain.  She was switched to anastrozole on 05/05/2018 and she is tolerating well.   -Mammogram on 05/04/2018 shows BI-RADS  Category 2.   -Mammogram completed on August 2020 at BI-RADS Category 2.  No evidence of new or recurrent breast cancer. -Recommend patient continue on anastrozole.  -She will return to clinic in 4 months.  2.  Peripheral neuropathy: -She is continuing gabapentin 600 mg 3 times a day.  Neuropathy is stable.  3.  Mild hypercalcemia: -She has mild hypercalcemia dating back to 2012.  She is continuing vitamin D but does not take calcium supplements because of kidney stones. -PTH was in the normal range at 35.  4.  Osteopenia: - DEXA scan on 08/25/2018 shows T score of -1.5. -She is continuing vitamin D supplements.  5.  Mild to moderate thrombocytopenia: -She had mild to moderate decrease in platelet count since August 2018. -CT scan in May 2019 shows normal-sized spleen with fatty liver. -This could be immune mediated thrombocytopenia.  We will closely monitor it.

## 2019-08-22 ENCOUNTER — Other Ambulatory Visit (HOSPITAL_COMMUNITY): Payer: Self-pay | Admitting: Cardiology

## 2019-08-22 DIAGNOSIS — K760 Fatty (change of) liver, not elsewhere classified: Secondary | ICD-10-CM | POA: Diagnosis not present

## 2019-08-22 DIAGNOSIS — Z6841 Body Mass Index (BMI) 40.0 and over, adult: Secondary | ICD-10-CM | POA: Diagnosis not present

## 2019-09-16 ENCOUNTER — Other Ambulatory Visit (HOSPITAL_COMMUNITY): Payer: Self-pay | Admitting: Cardiology

## 2019-09-27 NOTE — Assessment & Plan Note (Signed)
1.  Clinical T2N0 HER-2 positive right breast cancer: -Biopsy of the right breast upper inner quadrant 1:00 mass, IDC, ER/PR positive, HER-2 positive, Ki-67 of 60% -Neoadjuvant chemotherapy with 6 cycles of TCHP from 05/29/2017 through 10/09/2017 -Right lumpectomy and lymph node biopsy on 12/09/2017, with lumpectomy #1 specimen with no malignancy, lumpectomy #2 specimen showing 2 cm IDC, grade 3, 1 out of 6 lymph nodes positive for malignancy.  (yPT1cyPN1a) - Based on KATHRINE trial, started on Kadcyla 3.6 mg/kg for 14 cycles started on 01/20/2018, tolerating it very well. -14 cycles of Kadcyla from 01/20/2018 through 11/04/2018. - She could not tolerate letrozole and all of chest pain and right lateral abdominal pain.  She was switched to anastrozole on 05/05/2018 and she is tolerating well.   -Mammogram on 05/04/2018 shows BI-RADS Category 2.   -Mammogram completed on August 2020 at BI-RADS Category 2.  No evidence of new or recurrent breast cancer. -Recommend patient continue on anastrozole.  -She will return to clinic in 4 months.  2.  Peripheral neuropathy: -She is continuing gabapentin 600 mg 3 times a day.  Neuropathy is stable.  3.  Mild hypercalcemia: -She has mild hypercalcemia dating back to 2012.  She is continuing vitamin D but does not take calcium supplements because of kidney stones. -PTH was in the normal range at 35.  4.  Osteopenia: - DEXA scan on 08/25/2018 shows T score of -1.5. -She is continuing vitamin D supplements.  5.  Mild to moderate thrombocytopenia: -She had mild to moderate decrease in platelet count since August 2018. -CT scan in May 2019 shows normal-sized spleen with fatty liver. -This could be immune mediated thrombocytopenia.  We will closely monitor it.

## 2019-11-09 IMAGING — CR DG CHEST 2V
2 series · 2 of 2 positions shown · non-contrast
Comparison: Radiographs February 20, 2018.

CLINICAL DATA: Chest pain.

EXAM:
CHEST - 2 VIEW

[w chest pa]
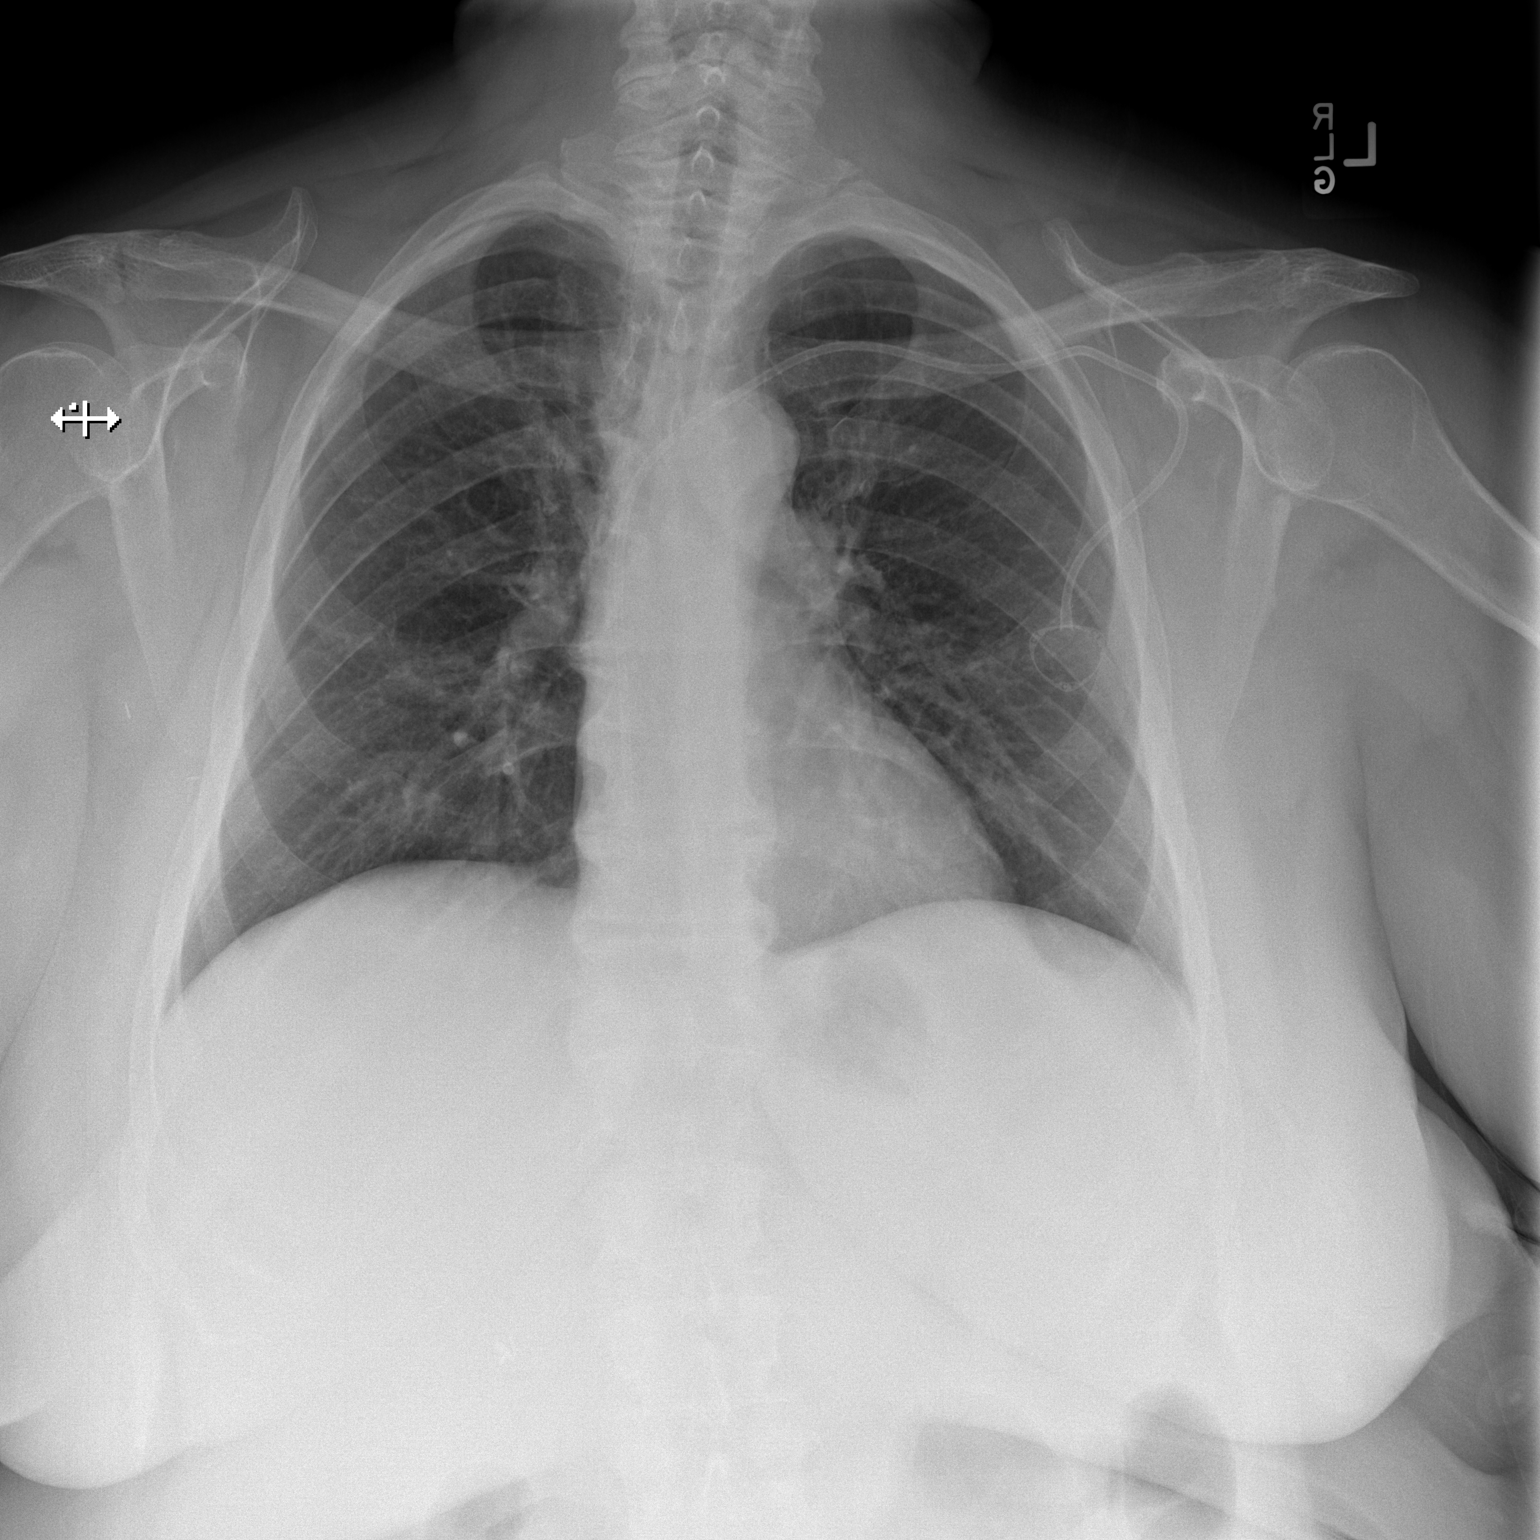

[w chest lat]
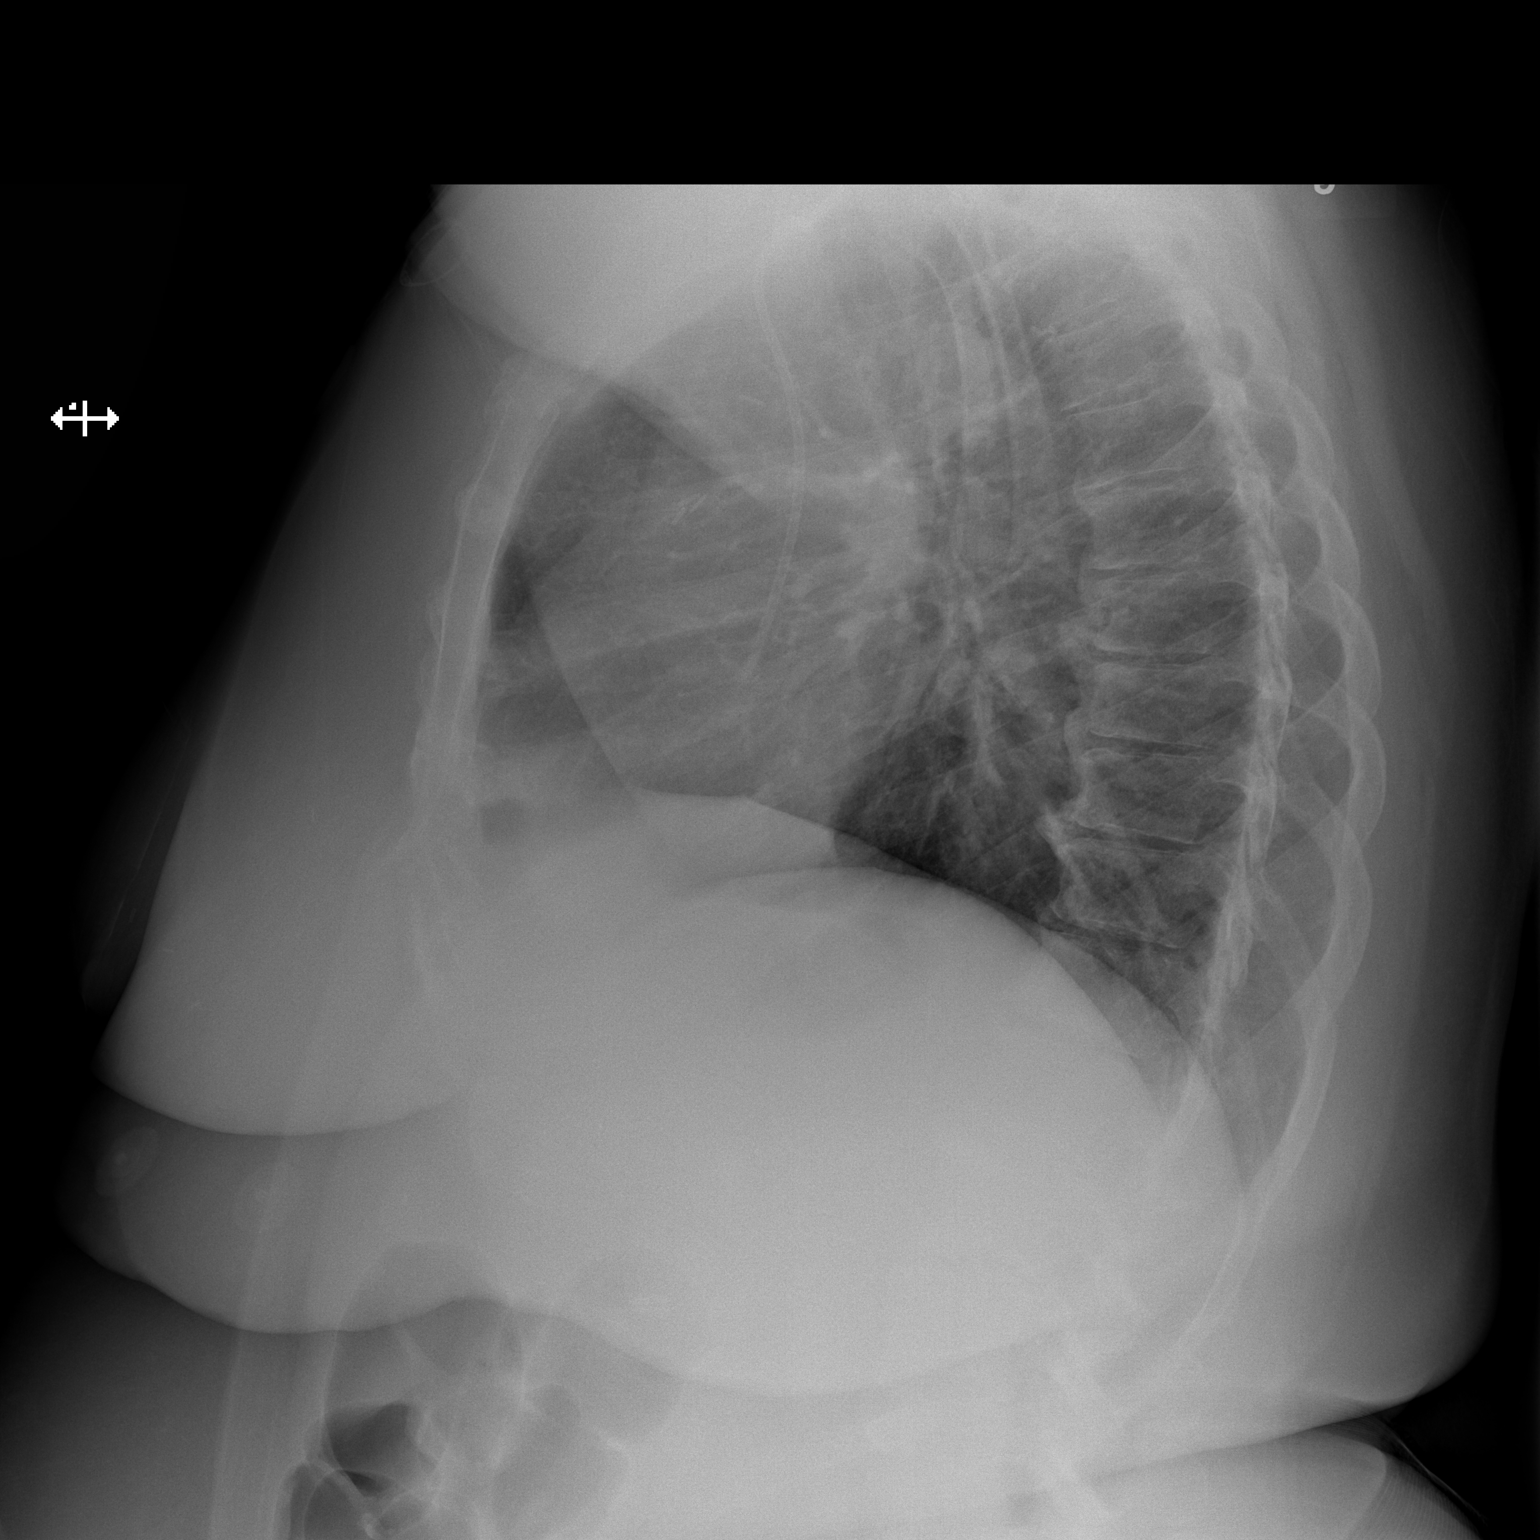

[2 of 2 positions shown; findings below may reference images not displayed]

FINDINGS: The heart size and mediastinal contours are within normal limits.
Both lungs are clear. No pneumothorax or pleural effusion is noted.
Left subclavian Port-A-Cath is noted with tip in expected position
of right atrium. The visualized skeletal structures are
unremarkable.
IMPRESSION: No active cardiopulmonary disease.

## 2019-11-09 IMAGING — CT CT ABD-PELV W/ CM
2 of 5 series · 17 of 46 positions shown, 19 images · IV contrast (ISOVUE)
Comparison: 05/27/2017

CLINICAL DATA: Abdominal pain and constipation

EXAM:
CT ABDOMEN AND PELVIS WITH CONTRAST
TECHNIQUE: Multidetector CT imaging of the abdomen and pelvis was performed
using the standard protocol following bolus administration of
intravenous contrast.
CONTRAST:  100mL [id number] IOPAMIDOL ([id number]-[id number]) INJECTION 61%

[Series 2: axial st · axial · 0.98mm/px · z∈[-482,-92]mm · 14 of 90 slices shown, 16 images]
[im 6/90  soft-tissue]
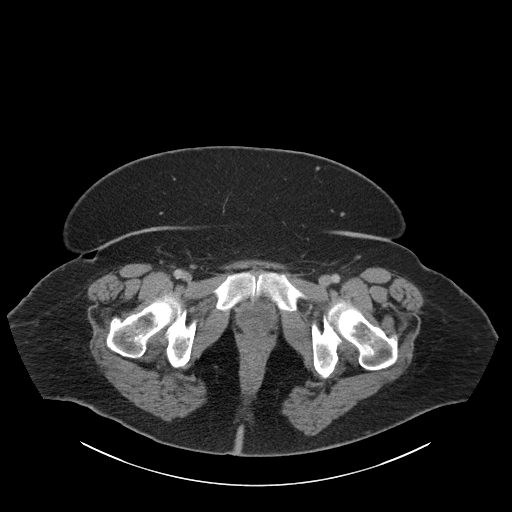
[im 6/90  bone]
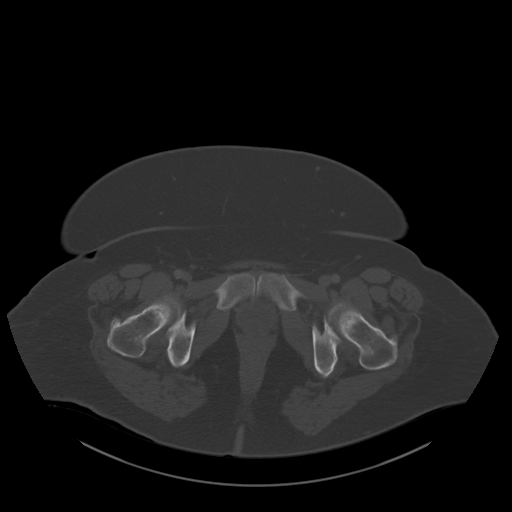
[im 12/90  soft-tissue]
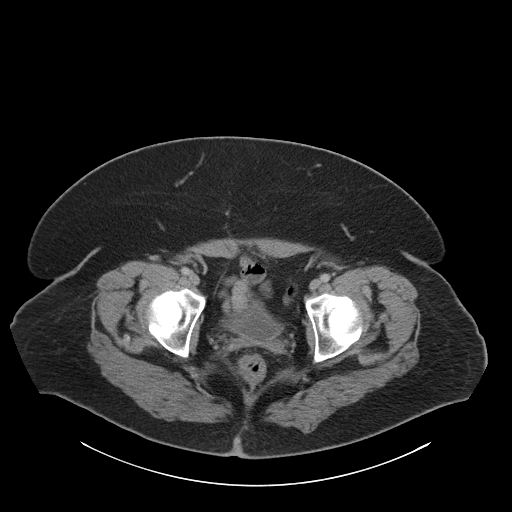
[im 18/90  soft-tissue]
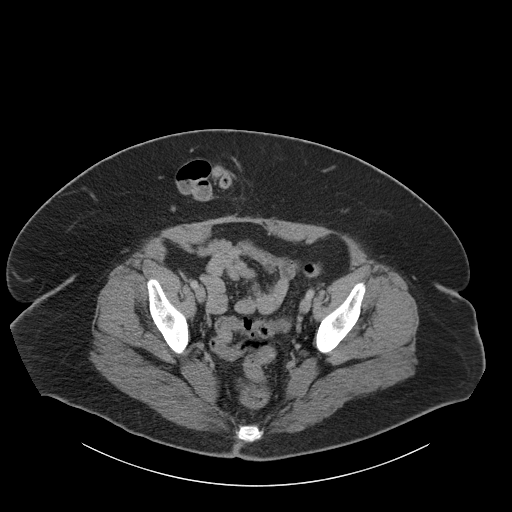
[im 24/90  soft-tissue]
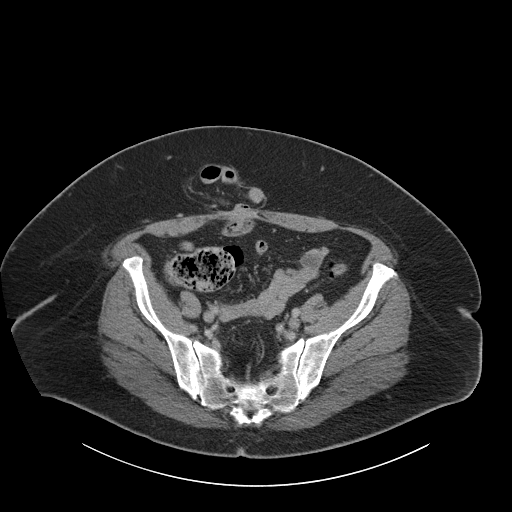
[im 30/90  soft-tissue]
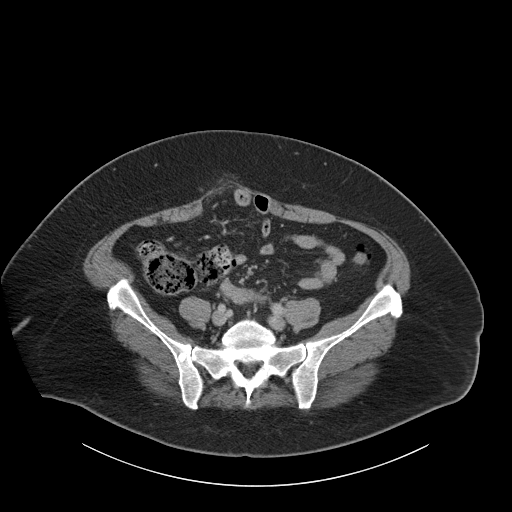
[im 36/90  soft-tissue]
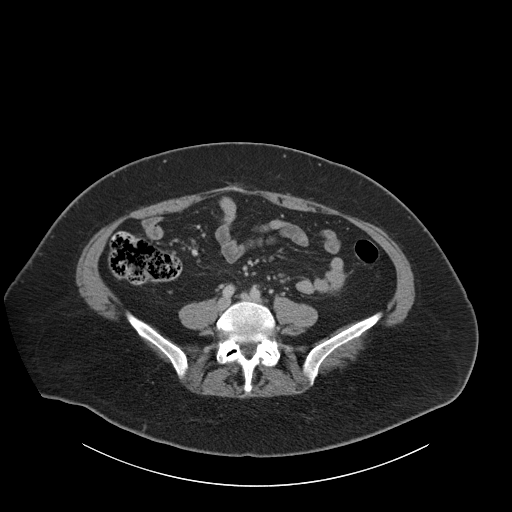
[im 42/90  soft-tissue]
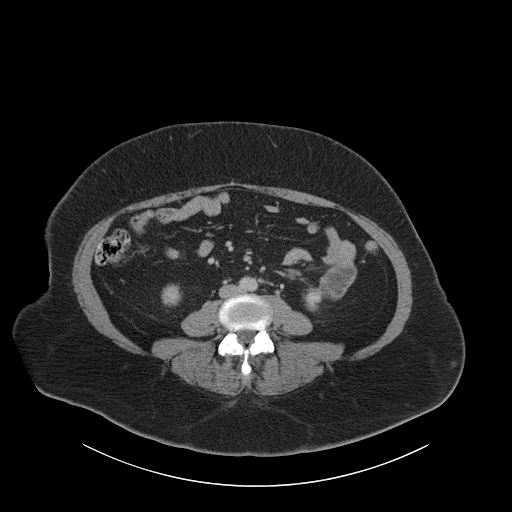
[im 48/90  soft-tissue]
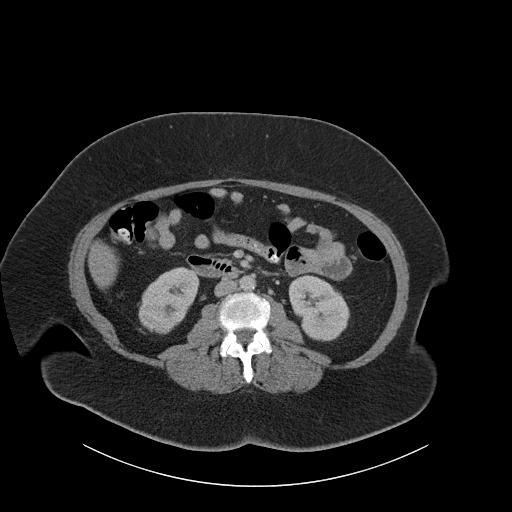
[im 54/90  soft-tissue]
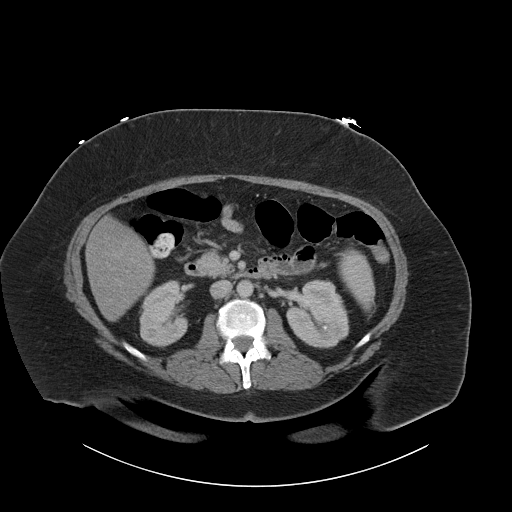
[im 54/90  bone]
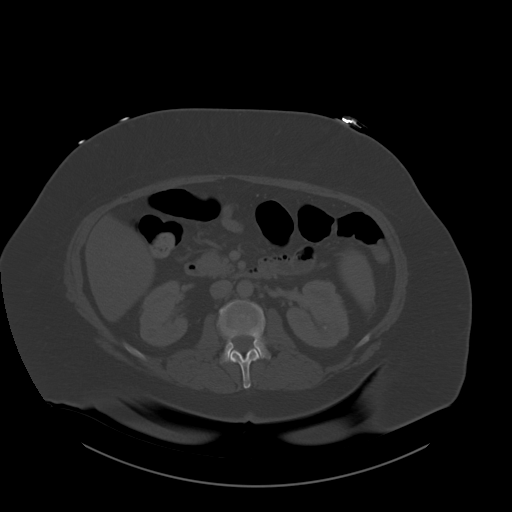
[im 60/90  soft-tissue]
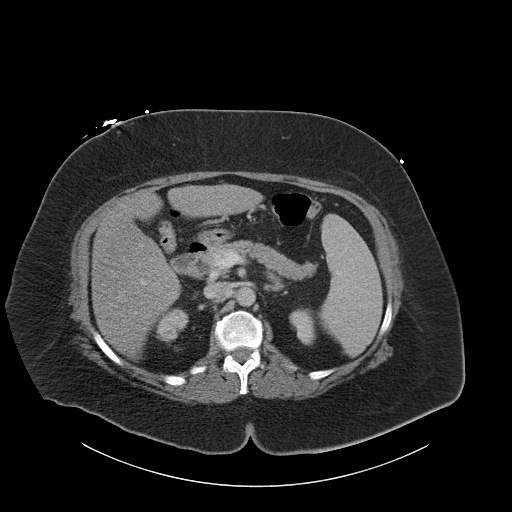
[im 66/90  soft-tissue]
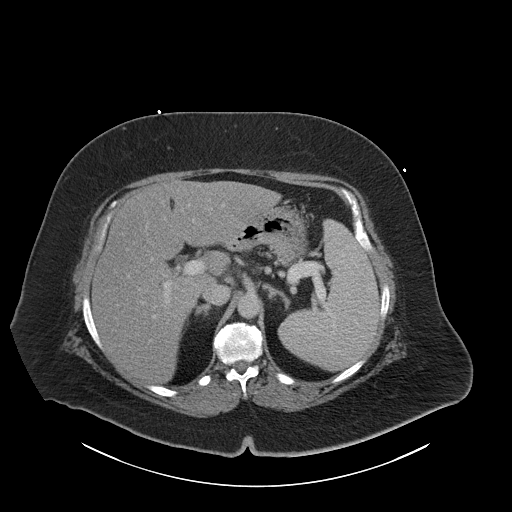
[im 72/90  soft-tissue]
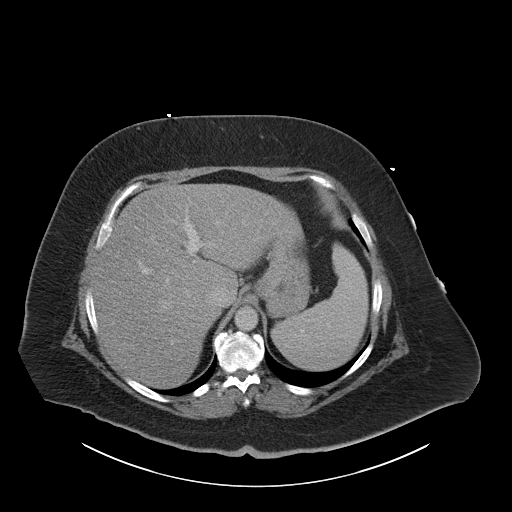
[im 78/90  soft-tissue]
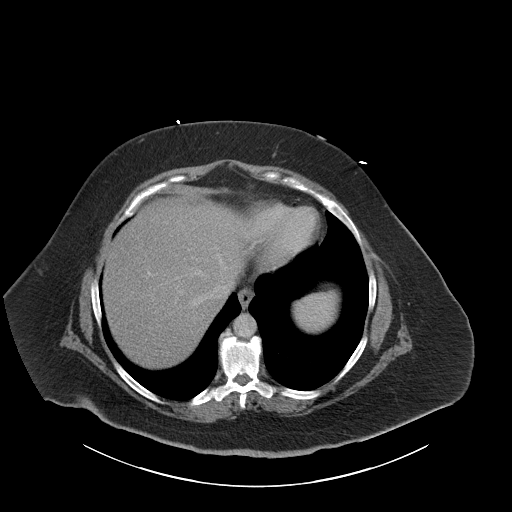
[im 84/90  soft-tissue]
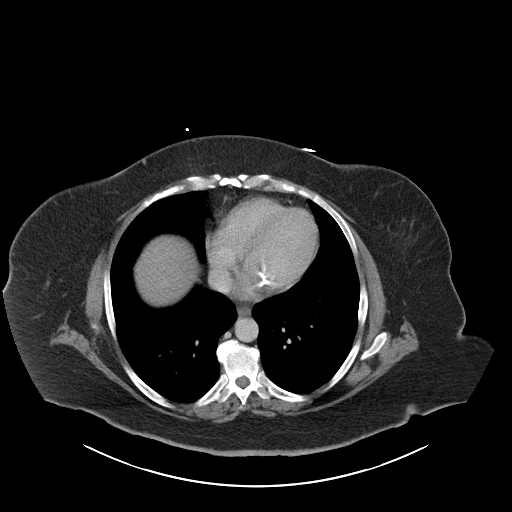

[Series 5: coronal st · coronal · 0.86mm/px · 3 of 107 slices shown]
[im 36/107  soft-tissue]
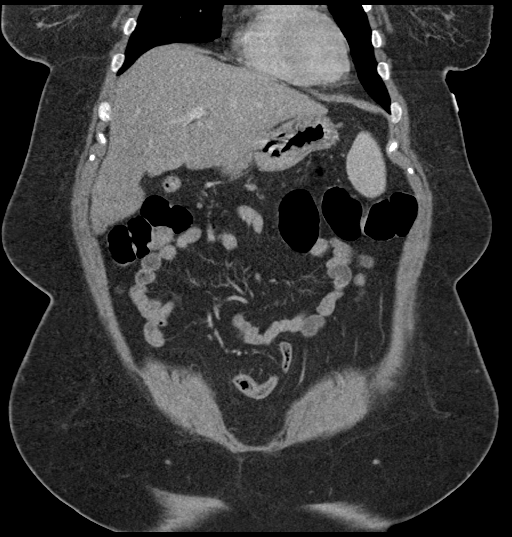
[im 48/107  soft-tissue]
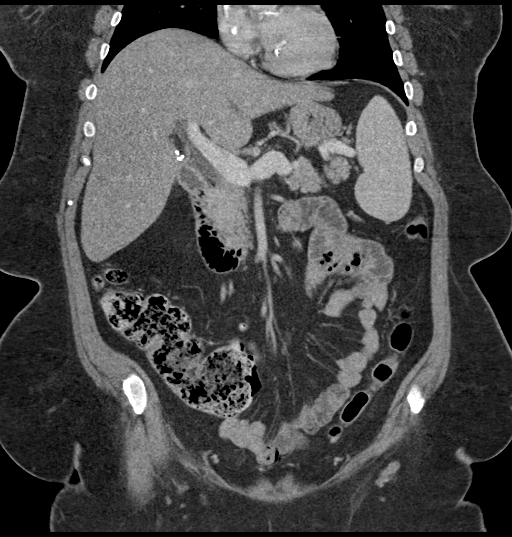
[im 59/107  soft-tissue]
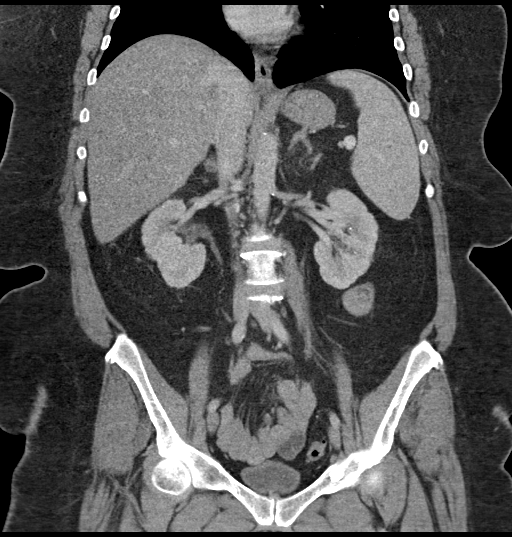

[17 of 46 positions shown; findings below may reference images not displayed]

FINDINGS: Lower chest: Lung bases are free of acute infiltrate or sizable
effusion.

Hepatobiliary: Fatty infiltration of the liver is noted. The
gallbladder has been surgically removed.

Pancreas: Pancreas is within normal limits.

Spleen: Normal in size without focal abnormality.

Adrenals/Urinary Tract: Adrenal glands are within normal limits.
Small nonobstructing renal stones are again identified bilaterally.
No ureteral calculi are seen. The bladder is decompressed.

Stomach/Bowel: Scattered diverticular change of the colon is noted
without evidence of diverticulitis. The appendix is within normal
limits. No obstructive changes of the small bowel are noted. A large
anterior abdominal wall hernia is again identified with multiple
loops of small bowel within although no obstructive changes are
noted.

Vascular/Lymphatic: No significant vascular findings are present. No
enlarged abdominal or pelvic lymph nodes.

Reproductive: Status post hysterectomy. No adnexal masses.

Other: No abdominal wall hernia or abnormality. No abdominopelvic
ascites.

Musculoskeletal: Likely bone islands are again noted in the pelvic
bones stable from the prior exam.
IMPRESSION: Stable anterior abdominal wall hernia with multiple loops of small
bowel within. No obstructive changes are noted.

Chronic changes as described above without acute abnormality.

## 2019-12-20 ENCOUNTER — Other Ambulatory Visit (HOSPITAL_COMMUNITY): Payer: Self-pay | Admitting: Cardiology

## 2020-01-26 ENCOUNTER — Inpatient Hospital Stay (HOSPITAL_COMMUNITY): Payer: Medicaid Other

## 2020-02-02 ENCOUNTER — Ambulatory Visit (HOSPITAL_COMMUNITY): Payer: BC Managed Care – PPO | Admitting: Nurse Practitioner

## 2020-02-20 ENCOUNTER — Other Ambulatory Visit (HOSPITAL_COMMUNITY): Payer: Self-pay | Admitting: *Deleted

## 2020-02-20 DIAGNOSIS — C50919 Malignant neoplasm of unspecified site of unspecified female breast: Secondary | ICD-10-CM

## 2020-02-20 DIAGNOSIS — Z170 Estrogen receptor positive status [ER+]: Secondary | ICD-10-CM

## 2020-02-20 DIAGNOSIS — C50211 Malignant neoplasm of upper-inner quadrant of right female breast: Secondary | ICD-10-CM

## 2020-02-21 ENCOUNTER — Inpatient Hospital Stay (HOSPITAL_COMMUNITY): Payer: 59 | Attending: Hematology

## 2020-02-21 ENCOUNTER — Other Ambulatory Visit: Payer: Self-pay

## 2020-02-21 DIAGNOSIS — E785 Hyperlipidemia, unspecified: Secondary | ICD-10-CM | POA: Insufficient documentation

## 2020-02-21 DIAGNOSIS — C50211 Malignant neoplasm of upper-inner quadrant of right female breast: Secondary | ICD-10-CM

## 2020-02-21 DIAGNOSIS — E119 Type 2 diabetes mellitus without complications: Secondary | ICD-10-CM | POA: Insufficient documentation

## 2020-02-21 DIAGNOSIS — Z170 Estrogen receptor positive status [ER+]: Secondary | ICD-10-CM | POA: Diagnosis not present

## 2020-02-21 DIAGNOSIS — Z79811 Long term (current) use of aromatase inhibitors: Secondary | ICD-10-CM | POA: Diagnosis not present

## 2020-02-21 DIAGNOSIS — E1142 Type 2 diabetes mellitus with diabetic polyneuropathy: Secondary | ICD-10-CM | POA: Insufficient documentation

## 2020-02-21 DIAGNOSIS — R748 Abnormal levels of other serum enzymes: Secondary | ICD-10-CM | POA: Insufficient documentation

## 2020-02-21 DIAGNOSIS — D696 Thrombocytopenia, unspecified: Secondary | ICD-10-CM | POA: Diagnosis not present

## 2020-02-21 DIAGNOSIS — Z9221 Personal history of antineoplastic chemotherapy: Secondary | ICD-10-CM | POA: Insufficient documentation

## 2020-02-21 DIAGNOSIS — M8580 Other specified disorders of bone density and structure, unspecified site: Secondary | ICD-10-CM | POA: Diagnosis not present

## 2020-02-21 DIAGNOSIS — I10 Essential (primary) hypertension: Secondary | ICD-10-CM | POA: Insufficient documentation

## 2020-02-21 DIAGNOSIS — Z87442 Personal history of urinary calculi: Secondary | ICD-10-CM | POA: Insufficient documentation

## 2020-02-21 DIAGNOSIS — K219 Gastro-esophageal reflux disease without esophagitis: Secondary | ICD-10-CM | POA: Diagnosis not present

## 2020-02-21 DIAGNOSIS — Z7984 Long term (current) use of oral hypoglycemic drugs: Secondary | ICD-10-CM | POA: Insufficient documentation

## 2020-02-21 DIAGNOSIS — C50919 Malignant neoplasm of unspecified site of unspecified female breast: Secondary | ICD-10-CM

## 2020-02-21 DIAGNOSIS — R011 Cardiac murmur, unspecified: Secondary | ICD-10-CM | POA: Diagnosis not present

## 2020-02-21 DIAGNOSIS — J45909 Unspecified asthma, uncomplicated: Secondary | ICD-10-CM | POA: Insufficient documentation

## 2020-02-21 DIAGNOSIS — G4730 Sleep apnea, unspecified: Secondary | ICD-10-CM | POA: Insufficient documentation

## 2020-02-21 LAB — CBC WITH DIFFERENTIAL/PLATELET
Abs Immature Granulocytes: 0.02 10*3/uL (ref 0.00–0.07)
Basophils Absolute: 0 10*3/uL (ref 0.0–0.1)
Basophils Relative: 0 %
Eosinophils Absolute: 0.2 10*3/uL (ref 0.0–0.5)
Eosinophils Relative: 3 %
HCT: 41.8 % (ref 36.0–46.0)
Hemoglobin: 13.4 g/dL (ref 12.0–15.0)
Immature Granulocytes: 0 %
Lymphocytes Relative: 29 %
Lymphs Abs: 1.4 10*3/uL (ref 0.7–4.0)
MCH: 27.3 pg (ref 26.0–34.0)
MCHC: 32.1 g/dL (ref 30.0–36.0)
MCV: 85.3 fL (ref 80.0–100.0)
Monocytes Absolute: 0.3 10*3/uL (ref 0.1–1.0)
Monocytes Relative: 7 %
Neutro Abs: 2.9 10*3/uL (ref 1.7–7.7)
Neutrophils Relative %: 61 %
Platelets: 135 10*3/uL — ABNORMAL LOW (ref 150–400)
RBC: 4.9 MIL/uL (ref 3.87–5.11)
RDW: 14.1 % (ref 11.5–15.5)
WBC: 4.9 10*3/uL (ref 4.0–10.5)
nRBC: 0 % (ref 0.0–0.2)

## 2020-02-21 LAB — COMPREHENSIVE METABOLIC PANEL
ALT: 57 U/L — ABNORMAL HIGH (ref 0–44)
AST: 61 U/L — ABNORMAL HIGH (ref 15–41)
Albumin: 4.1 g/dL (ref 3.5–5.0)
Alkaline Phosphatase: 124 U/L (ref 38–126)
Anion gap: 9 (ref 5–15)
BUN: 21 mg/dL — ABNORMAL HIGH (ref 6–20)
CO2: 25 mmol/L (ref 22–32)
Calcium: 10.9 mg/dL — ABNORMAL HIGH (ref 8.9–10.3)
Chloride: 101 mmol/L (ref 98–111)
Creatinine, Ser: 0.92 mg/dL (ref 0.44–1.00)
GFR calc Af Amer: >60 mL/min (ref >60)
GFR calc non Af Amer: >60 mL/min (ref >60)
Glucose, Bld: 278 mg/dL — ABNORMAL HIGH (ref 70–99)
Potassium: 4.1 mmol/L (ref 3.5–5.1)
Sodium: 135 mmol/L (ref 135–145)
Total Bilirubin: 0.6 mg/dL (ref 0.3–1.2)
Total Protein: 7.8 g/dL (ref 6.5–8.1)

## 2020-02-29 ENCOUNTER — Other Ambulatory Visit (HOSPITAL_COMMUNITY): Payer: Self-pay | Admitting: Nurse Practitioner

## 2020-02-29 ENCOUNTER — Other Ambulatory Visit: Payer: Self-pay

## 2020-02-29 ENCOUNTER — Inpatient Hospital Stay (HOSPITAL_BASED_OUTPATIENT_CLINIC_OR_DEPARTMENT_OTHER): Payer: 59 | Admitting: Nurse Practitioner

## 2020-02-29 VITALS — BP 104/56 | HR 79 | Temp 96.8°F | Resp 18 | Wt 258.7 lb

## 2020-02-29 DIAGNOSIS — Z170 Estrogen receptor positive status [ER+]: Secondary | ICD-10-CM | POA: Diagnosis not present

## 2020-02-29 DIAGNOSIS — C50211 Malignant neoplasm of upper-inner quadrant of right female breast: Secondary | ICD-10-CM | POA: Diagnosis not present

## 2020-02-29 NOTE — Assessment & Plan Note (Signed)
1.  Clinical T2N0 HER-2 positive breast cancer: -Biopsy of the right breast upper inner quadrant 1 o'clock position mass, IDC, ER/PR positive, HER-2 positive, Ki 67 to 60%. -Neoadjuvant chemotherapy with 6 cycles of TCHP from 05/29/2017 through 10/09/2017. -Right lumpectomy and lymph node biopsy on 12/09/2017, with lumpectomy #1 specimen with no malignancy, lumpectomy #2 specimen showing 2 cm IDC, grade 3, 1 out of 6 lymph nodes positive for malignancy. -Based on [place] trial, started on Kadcyla 3.6 mcg/kg for 14 cycle started on 01/20/2018 through 11/04/2018. -She was unable to tolerate letrozole due to chest pain and right lateral abdominal pain. -She was then placed on anastrozole on 05/05/2018 she is tolerating this well. -Last mammogram 05/24/2019 was B RADS category 2 benign. -Recommended patient continue on anastrozole. -She will follow-up in 6 months with repeat labs and mammogram.  2.  Peripheral neuropathy: -She is continue taking gabapentin 600 mg 3 times a day. -Neuropathy is stable.  3.  Mild hypercalcemia: -She has mild hypercalcemia dating back to 2012. -She continues taking vitamin D but does not take calcium supplements due to kidney stones. -PTH was in normal range last checked at 35. -Labs done 02/21/2020 showed calcium 10.9.  4.  Osteopenia: -DEXA scan on 08/25/2018 showed T score of -1.5. -She is continue taking vitamin D supplements. -We will set up a DEXA scan on her next visit.  5.  Mild to moderate thrombocytopenia: -She has mild to moderate decrease in platelets since August 2018. -CT scan in May 2019 showed normal-sized spleen with a fatty liver. -This could be a immune mediated thrombocytopenia. -We will closely monitor it. -Labs done on 02/21/2019 showed platelet count of 135.  6.  Elevated liver enzymes: -CT scan on May 2019 showed fatty liver. -Her PCP is following her liver enzymes closely. -Labs done on 02/21/2019 showed AST 61 and ALT 57

## 2020-02-29 NOTE — Progress Notes (Signed)
[address], [place] [address]   CLINIC:  Medical Oncology/Hematology  PCP:  Sharilyn Sites, [place] [address] [phone]   REASON FOR VISIT: Follow-up for breast cancer   CURRENT THERAPY: Anastrozole  BRIEF ONCOLOGIC HISTORY:  Oncology History  Malignant neoplasm of upper-inner quadrant of right breast in female, estrogen receptor positive ([place])  04/13/2017 Initial Diagnosis   Screening detected right breast mass with calcifications 2.1 cm in size and 3.4 cm in size. Calcifications were fibroadenoma. Right breast biopsy upper inner quadrant 1:00 mass: IDC with DCIS with necrosis, grade 3, ER 100%, PR 70%, Ki-67 60%, HER-2 positive ratio 2.34, T2 N1 stage IB (New AJCC)   04/25/2017 Breast MRI   Right breast upper inner quadrant 2.2 x 1.7 x 2.7 cm mass, abnormal area of clumped non-mass enhancement in the right lateral breast 3.2 x 1.9 x 1 cm, too abnormal lymph nodes right axilla 4.3 and 1.7 cm (biopsy-proven breast cancer)    05/08/2017 Procedure   Right axilla lymph node biopsy: Metastatic carcinoma   05/13/2017 Procedure   Right breast biopsy retroareolar region: Intraductal papilloma   01/20/2018 -  Chemotherapy   The patient had ado-trastuzumab emtansine (KADCYLA) 420 mg in sodium chloride 0.9 % 250 mL chemo infusion, 3.6 mg/kg = 420 mg, Intravenous, Once, 14 of 14 cycles Administration: 420 mg (01/20/2018), 420 mg (02/10/2018), 420 mg (04/14/2018), 420 mg (03/03/2018), 420 mg (03/24/2018), 420 mg (05/05/2018), 420 mg (05/26/2018), 420 mg (06/16/2018), 420 mg (07/08/2018), 420 mg (07/29/2018), 420 mg (08/19/2018), 420 mg (09/13/2018), 420 mg (10/08/2018), 420 mg (11/04/2018)  for chemotherapy treatment.      CANCER STAGING: Cancer Staging Malignant neoplasm of upper-inner quadrant of right breast in female, estrogen receptor positive ([place]) Staging form: Breast, AJCC 8th Edition - Clinical stage from 04/22/2017: Stage IB (cT2,  cN0, cM0, G3, ER: Positive, PR: Positive, HER2: Positive) - Unsigned    INTERVAL HISTORY:  [name] 59 y.o. female returns for routine follow-up for breast cancer.  Patient reports she is doing well since her last visit.  She has no new lumps or bumps present.  She denies any bone pain. Denies any nausea, vomiting, or diarrhea. Denies any new pains. Had not noticed any recent bleeding such as epistaxis, hematuria or hematochezia. Denies recent chest pain on exertion, shortness of breath on minimal exertion, pre-syncopal episodes, or palpitations. Denies any numbness or tingling in hands or feet. Denies any recent fevers, infections, or recent hospitalizations. Patient reports appetite at 100% and energy level at 75%.  She is eating well maintain her weight at this time.     REVIEW OF SYSTEMS:  Review of Systems  Respiratory: Positive for cough.   Neurological: Positive for dizziness, headaches and numbness.  Psychiatric/Behavioral: Positive for depression and sleep disturbance.  All other systems reviewed and are negative.    PAST MEDICAL/SURGICAL HISTORY:  Past Medical History:  Diagnosis Date  . Asthma   . Blood transfusion    as child  . Breast cancer ([place]) 2018  . Cancer ([place])   . Diabetes ([place])    type 2   . GERD (gastroesophageal reflux disease)   . Headache   . Heart murmur    d/t aortic stenosis   . History of kidney stones   . Hyperlipidemia   . Hypertension   . Normal echocardiogram   . Personal history of chemotherapy 2018  . Personal history of radiation therapy 2018  . Sleep apnea  Past Surgical History:  Procedure Laterality Date  . ABDOMINAL HYSTERECTOMY  2004  . BREAST LUMPECTOMY Right 2018  . BREAST LUMPECTOMY WITH RADIOACTIVE SEED AND SENTINEL LYMPH NODE BIOPSY Right 12/09/2017   Procedure: RIGHT BREAST LUMPECTOMY WITH RADIOACTIVE SEED AND SENTINEL LYMPH NODE BIOPSY AND RADIOACTIVE SEED TARGETED RIGHT AXILLARY LYMPH NODE EXCISION;  Surgeon: Alphonsa Overall, MD;  Location: [facility];  Service: General;  Laterality: Right;  . CHOLECYSTECTOMY  05/30/2011   Procedure: LAPAROSCOPIC CHOLECYSTECTOMY;  Surgeon: Donato Heinz;  Location: [facility];  Service: General;  Laterality: N/A;  . 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 KIDNEY STONE SURGERY     2017 2-23  . PORTACATH PLACEMENT N/A 05/25/2017   Procedure: INSERTION PORT-A-CATH;  Surgeon: Alphonsa Overall, MD;  Location: [facility];  Service: General;  Laterality: N/A;     SOCIAL HISTORY:  Social History   Socioeconomic History  . Marital status: Married    Spouse name: Not on file  . Number of children: Not on file  . Years of education: Not on file  . Highest education level: Not on file  Occupational History  . Not on file  Tobacco Use  . Smoking status: Never Smoker  . Smokeless tobacco: Never Used  Substance and Sexual Activity  . Alcohol use: Yes    Comment: 2 x year  . Drug use: No  . Sexual activity: Yes    Birth control/protection: Surgical  Other Topics Concern  . Not on file  Social History Narrative  . Not on file   Social Determinants of Health   Financial Resource Strain:   . Difficulty of Paying Living Expenses:   Food Insecurity:   . Worried About Charity fundraiser in the Last Year:   . Arboriculturist in the Last Year:   Transportation Needs:   . Film/video editor (Medical):   Marland Kitchen Lack of Transportation (Non-Medical):   Physical Activity:   . Days of Exercise per Week:   . Minutes of Exercise per Session:   Stress:   . Feeling of Stress :   Social Connections:   . Frequency of Communication with Friends and Family:   . Frequency of Social Gatherings with Friends and Family:   . Attends Religious Services:   . Active Member of Clubs or Organizations:   . Attends Archivist Meetings:   Marland Kitchen Marital Status:   Intimate Partner Violence:   . Fear of Current or Ex-Partner:   . Emotionally Abused:   Marland Kitchen Physically Abused:   .  Sexually Abused:     FAMILY HISTORY:  Family History  Problem Relation Age of Onset  . Lung cancer Maternal Grandmother   . Anesthesia problems Neg Hx   . Hypotension Neg Hx   . Malignant hyperthermia Neg Hx   . Pseudochol deficiency Neg Hx     CURRENT MEDICATIONS:  Outpatient Encounter Medications as of 02/29/2020  Medication Sig  . anastrozole (ARIMIDEX) 1 MG tablet TAKE ONE TABLET BY MOUTH ONCE DAILY.  . cholecalciferol (VITAMIN D3) 25 MCG (1000 UT) tablet Take 1,000 Units by mouth daily.  Marland Kitchen escitalopram (LEXAPRO) 20 MG tablet Take 10 mg by mouth at bedtime.   . fenofibrate 160 MG tablet Take 160 mg by mouth at bedtime.   . gabapentin (NEURONTIN) 600 MG tablet Take 600 mg by mouth 3 (three) times daily.  Marland Kitchen losartan (COZAAR) 50 MG tablet Take 25 mg  by mouth at bedtime.   . metFORMIN (GLUCOPHAGE) 500 MG tablet Take 500 mg by mouth 2 (two) times daily with a meal.  . Multiple Vitamin (MULTIVITAMIN) tablet Take 1 tablet by mouth at bedtime.   Marland Kitchen omeprazole (PRILOSEC) 40 MG capsule Take 1 capsule (40 mg total) by mouth daily.  . [DISCONTINUED] icosapent Ethyl (VASCEPA) 1 g capsule Take 2 capsules (2 g total) by mouth in the morning and at bedtime. Please call for an office visit [phone]  . acetaminophen (TYLENOL) 500 MG tablet Take 1,000 mg by mouth daily as needed for moderate pain or headache.  . albuterol (PROVENTIL HFA;VENTOLIN HFA) 108 (90 BASE) MCG/ACT inhaler Inhale 1 puff into the lungs every 6 (six) hours as needed for wheezing or shortness of breath.   . lidocaine-prilocaine (EMLA) cream APPLY TO AFFECTED AREA ONCE AS DIRECTED. (Patient not taking: Reported on 02/29/2020)  . meclizine (ANTIVERT) 25 MG tablet Take 1 tablet (25 mg total) by mouth 3 (three) times daily as needed for dizziness. (Patient not taking: Reported on 02/29/2020)  . Skin Protectants, Misc. (EUCERIN) cream Apply 1 application topically as needed for dry skin.   No facility-administered encounter  medications on file as of 02/29/2020.    ALLERGIES:  Allergies  Allergen Reactions  . Latex Itching  . Pyridostigmine Bromide Other (See Comments)    Makes muscles twitch  . Statins Nausea Only     PHYSICAL EXAM:  ECOG Performance status: 1  Vitals:   02/29/20 0943  BP: (!) 104/56  Pulse: 79  Resp: 18  Temp: (!) 96.8 F (36 C)  SpO2: 96%   Filed Weights   02/29/20 0943  Weight: 258 lb 11.2 oz (117.3 kg)   Physical Exam Constitutional:      Appearance: Normal appearance. She is normal weight.  Cardiovascular:     Rate and Rhythm: Normal rate and regular rhythm.     Heart sounds: Normal heart sounds.  Pulmonary:     Effort: Pulmonary effort is normal.     Breath sounds: Normal breath sounds.  Abdominal:     General: Bowel sounds are normal.     Palpations: Abdomen is soft.  Musculoskeletal:        General: Normal range of motion.  Skin:    General: Skin is warm.  Neurological:     Mental Status: She is alert and oriented to person, place, and time. Mental status is at baseline.  Psychiatric:        Mood and Affect: Mood normal.        Behavior: Behavior normal.        Thought Content: Thought content normal.        Judgment: Judgment normal.   Breast: Right: Surgical site well-healed with scar tissue.  Does have mild lymphedema on underside of breast.  Patient reports it is not painful.  No palpable masses Left: No palpable masses, no nipple drainage, skin within normal limits.   LABORATORY DATA:  I have reviewed the labs as listed.  CBC    Component Value Date/Time   WBC 4.9 02/21/2020 1322   RBC 4.90 02/21/2020 1322   HGB 13.4 02/21/2020 1322   HGB 13.8 04/22/2017 0831   HCT 41.8 02/21/2020 1322   HCT 41.9 04/22/2017 0831   PLT 135 (L) 02/21/2020 1322   PLT 184 04/22/2017 0831   MCV 85.3 02/21/2020 1322   MCV 85.7 04/22/2017 0831   MCH 27.3 02/21/2020 1322   MCHC 32.1 02/21/2020 1322  RDW 14.1 02/21/2020 1322   RDW 13.9 04/22/2017 0831    LYMPHSABS 1.4 02/21/2020 1322   LYMPHSABS 2.1 04/22/2017 0831   MONOABS 0.3 02/21/2020 1322   MONOABS 0.5 04/22/2017 0831   EOSABS 0.2 02/21/2020 1322   EOSABS 0.3 04/22/2017 0831   BASOSABS 0.0 02/21/2020 1322   BASOSABS 0.0 04/22/2017 0831   CMP Latest Ref Rng & Units 02/21/2020 07/28/2019 03/17/2019  Glucose 70 - 99 mg/dL 278(H) 242(H) 177(H)  BUN 6 - 20 mg/dL 21(H) 25(H) 22(H)  Creatinine 0.44 - 1.00 mg/dL 0.92 0.94 0.86  Sodium 135 - 145 mmol/L 135 139 140  Potassium 3.5 - 5.1 mmol/L 4.1 4.2 4.3  Chloride 98 - 111 mmol/L 101 103 105  CO2 22 - 32 mmol/L '25 25 25  ' Calcium 8.9 - 10.3 mg/dL 10.9(H) 10.8(H) 10.8(H)  Total Protein 6.5 - 8.1 g/dL 7.8 7.6 8.0  Total Bilirubin 0.3 - 1.2 mg/dL 0.6 0.5 0.4  Alkaline Phos 38 - 126 U/L 124 124 125  AST 15 - 41 U/L 61(H) 43(H) 44(H)  ALT 0 - 44 U/L 57(H) 40 30   All questions were answered to patient's stated satisfaction. Encouraged patient to call with any new concerns or questions before his next visit to the cancer center and we can certain see him sooner, if needed.     ASSESSMENT & PLAN:  Malignant neoplasm of upper-inner quadrant of right breast in female, estrogen receptor positive ([place]) 1.  Clinical T2N0 HER-2 positive breast cancer: -Biopsy of the right breast upper inner quadrant 1 o'clock position mass, IDC, ER/PR positive, HER-2 positive, Ki 67 to 60%. -Neoadjuvant chemotherapy with 6 cycles of TCHP from 05/29/2017 through 10/09/2017. -Right lumpectomy and lymph node biopsy on 12/09/2017, with lumpectomy #1 specimen with no malignancy, lumpectomy #2 specimen showing 2 cm IDC, grade 3, 1 out of 6 lymph nodes positive for malignancy. -Based on [place] trial, started on Kadcyla 3.6 mcg/kg for 14 cycle started on 01/20/2018 through 11/04/2018. -She was unable to tolerate letrozole due to chest pain and right lateral abdominal pain. -She was then placed on anastrozole on 05/05/2018 she is tolerating this well. -Last mammogram 05/24/2019  was B RADS category 2 benign. -Recommended patient continue on anastrozole. -She will follow-up in 6 months with repeat labs and mammogram.  2.  Peripheral neuropathy: -She is continue taking gabapentin 600 mg 3 times a day. -Neuropathy is stable.  3.  Mild hypercalcemia: -She has mild hypercalcemia dating back to 2012. -She continues taking vitamin D but does not take calcium supplements due to kidney stones. -PTH was in normal range last checked at 35. -Labs done 02/21/2020 showed calcium 10.9.  4.  Osteopenia: -DEXA scan on 08/25/2018 showed T score of -1.5. -She is continue taking vitamin D supplements. -We will set up a DEXA scan on her next visit.  5.  Mild to moderate thrombocytopenia: -She has mild to moderate decrease in platelets since August 2018. -CT scan in May 2019 showed normal-sized spleen with a fatty liver. -This could be a immune mediated thrombocytopenia. -We will closely monitor it. -Labs done on 02/21/2019 showed platelet count of 135.  6.  Elevated liver enzymes: -CT scan on May 2019 showed fatty liver. -Her PCP is following her liver enzymes closely. -Labs done on 02/21/2019 showed AST 61 and ALT 57       Orders placed this encounter:  Orders Placed This Encounter  Procedures  . MM DIAG BREAST TOMO BILATERAL  . Lactate dehydrogenase  . CBC  with Differential/Platelet  . Comprehensive metabolic panel  . Vitamin B12  . VITAMIN D 25 Hydroxy (Vit-D Deficiency, Fractures)  . Folate      Renee Finders, Renee Harris [place] [phone]

## 2020-02-29 NOTE — Patient Instructions (Signed)
[place] at [facility] Discharge Instructions  Follow up in 4 months with labs  Have mammogram in 3 months    Thank you for choosing [place] at [facility] to provide your oncology and hematology care.  To afford each patient quality time with our provider, please arrive at least 15 minutes before your scheduled appointment time.   If you have a lab appointment with the [facility] please come in thru the Main Entrance and check in at the main information desk.  You need to re-schedule your appointment should you arrive 10 or more minutes late.  We strive to give you quality time with our providers, and arriving late affects you and other patients whose appointments are after yours.  Also, if you no show three or more times for appointments you may be dismissed from the clinic at the providers discretion.     Again, thank you for choosing [facility].  Our hope is that these requests will decrease the amount of time that you wait before being seen by our physicians.       _____________________________________________________________  Should you have questions after your visit to [facility], please contact our office at [phone] between the hours of 8:00 a.m. and 4:30 p.m.  Voicemails left after 4:00 p.m. will not be returned until the following business day.  For prescription refill requests, have your pharmacy contact our office and allow 72 hours.    Due to Covid, you will need to wear a mask upon entering the hospital. If you do not have a mask, a mask will be given to you at the Main Entrance upon arrival. For doctor visits, patients may have 1 support person with them. For treatment visits, patients can not have anyone with them due to social distancing guidelines and our immunocompromised population.

## 2020-04-11 ENCOUNTER — Other Ambulatory Visit (HOSPITAL_COMMUNITY): Payer: Self-pay | Admitting: Hematology

## 2020-05-02 ENCOUNTER — Encounter (HOSPITAL_BASED_OUTPATIENT_CLINIC_OR_DEPARTMENT_OTHER): Payer: Self-pay | Admitting: Surgery

## 2020-05-02 ENCOUNTER — Other Ambulatory Visit: Payer: Self-pay | Admitting: Surgery

## 2020-05-07 ENCOUNTER — Other Ambulatory Visit (HOSPITAL_COMMUNITY)
Admission: RE | Admit: 2020-05-07 | Discharge: 2020-05-07 | Disposition: A | Discharge status: Home / Self Care | Payer: 59 | Source: Ambulatory Visit | Attending: Surgery | Admitting: Surgery

## 2020-05-07 ENCOUNTER — Other Ambulatory Visit (HOSPITAL_COMMUNITY): Payer: 59

## 2020-05-07 DIAGNOSIS — Z01812 Encounter for preprocedural laboratory examination: Secondary | ICD-10-CM | POA: Insufficient documentation

## 2020-05-07 DIAGNOSIS — Z20822 Contact with and (suspected) exposure to covid-19: Secondary | ICD-10-CM | POA: Insufficient documentation

## 2020-05-07 LAB — SARS CORONAVIRUS 2 (TAT 6-24 HRS): SARS Coronavirus 2: NEGATIVE

## 2020-05-09 ENCOUNTER — Encounter (HOSPITAL_BASED_OUTPATIENT_CLINIC_OR_DEPARTMENT_OTHER): Payer: Self-pay | Admitting: Anesthesiology

## 2020-05-09 NOTE — H&P (Signed)
Renee Harris Location: [facility] Surgery Patient #: [id number] DOB: 26-Jun-1961 Married / Language: English / Race: White Female  History of Present Illness  The patient is a 59 year old female who presents with a complaint of Right breast cancer. The PCP is D. Eddie Candle The patient was seen at the Breast [facility] - Oncology is Drs. Dr. Tomie China in [place] and [place] Husband, Richard, with her.  She is doing well. She is on anastrazole. Her next mammogram is in August 2021. She is interested in getting her power port removed. I went over this with her. We talked about the Covid vaccine. I encouraged her to get it. She and her husband are both looking at.  Plan: 1. Follow up in one year, 2. Will remove power port in the mean time.  Past Medical History: 1. Right breast cancer Compelted neoadjuvant chemotx in [place] (Dr. Mathis Dad Higgs) Biopsy: 04/13/2017 ([id number]) - UIQ mass at 1 o'clock - IDC, grade 3, ER - 100%, PR - 70%, Ki67 - 60%, Her2Neu - POSITIVE .Marland KitchenMarland Kitchen UOQ Ca++ - fibroadenoma Her right axillary lymph node biopsy is positive for cancer (05/13/2017) Right breast lumpectomy and right axillary targeted/SLNBx on 12/09/2017 ([id number]) - 1) intraductal papilloma, 2) IDC, 2.0 cm, 0.1 cm from superior margin (additional excised margin okay), 1/6 nodes. Radiation - 02/22/2018 - 04/08/2018 - [place] On anstrazole  2. HTN - for 15 years 3. Asthma - seasonal - mainly winter 4. History of kidney stones Sees Dr. Leo Grosser in [place] 5. Lap chole Deneise Lever [place] - 05/2011 6. Never had colonoscopy - we talked about this 7. "wall eyes" - exotropia 8. Heart murmur - 3/6 remote echo 9. History of ocular myasthenia gravis She took steroids for a long time for this, but stopped about 20 years ago. 10. hysterectomy in 2004 for fibroids 11. Left subclavian power port - 05/25/2017 - D.  Victor to remove  Social History: Husband, Richard Her sister, Jeannene Patella. No children She keeps her neice's kids. No other job. [Note - her husband is to have back surgery on 01/14/2018 with [facility]]   Allergies (Chanel Teressa Senter, CMA; 04/04/2020 11:31 AM) Latex Exam Gloves *MEDICAL DEVICES AND SUPPLIES*  Statins  Pyridostigmine Bromide *ANTIMYASTHENIC/CHOLINERGIC AGENTS*  Allergies Reconciled   Medication History (Chanel Teressa Senter, CMA; 04/04/2020 11:31 AM) Omeprazole (40MG Capsule DR, Oral) Active. Allopurinol (300MG Tablet, Oral) Active. Escitalopram Oxalate (20MG Tablet, Oral) Active. Fenofibrate (160MG Tablet, Oral) Active. HydroCHLOROthiazide (12.5MG Capsule, Oral) Active. Losartan Potassium (50MG Tablet, Oral) Active. MetFORMIN HCl (500MG Tablet, Oral) Active. Aspirin (81MG Tablet DR, Oral) Active. Albuterol (90MCG/ACT Aerosol Soln, Inhalation) Active. Cholecalciferol (1000UNIT Capsule, Oral) Active. Omega 3 (1000MG Capsule, Oral) Active. Anastrozole (1MG Tablet, Oral) Active. Gabapentin (600MG Tablet, Oral) Active. Vascepa (1GM Capsule, Oral) Active. Ado-Trastuzumab Emtansine (100MG For Solution, Intravenous) Active. Letrozole (2.5MG Tablet, Oral) Active. Medications Reconciled  Vitals (Chanel Nolan CMA; 04/04/2020 11:31 AM) 04/04/2020 11:31 AM Weight: 255.13 lb Height: 62in Body Surface Area: 2.12 m Body Mass Index: 46.66 kg/m  Temp.: 98.47F  Pulse: 98 (Regular)  BP: 132/82(Sitting, Left Arm, Standard)   Physical Exam  General: WN obese WF alert and generally healthy appearing. She is wearing a mask. Skin: Inspection and palpation of the skin unremarkable.  Eyes: Conjunctivae white, Has "wall eye" (exotropia) - she said she had surgery when she was young, but it did not work Face, ears, nose, mouth, and throat: Face - she is wearing a mask.  Neck: Supple. No mass. Trachea midline. No thyroid mass.  Lymph Nodes: No  supraclavicular or cervical adenopathy. No axillary adenopathy. Right axillary incision okay.  Breasts: Right - Right breast periareolar incision looks good. The breast is denser, consistent with her radiation.  Left - No mass or nodule. She a port in the upper left chest.  Musculoskeletal/extremities: Good strength and ROM in upper and lower extremities.    Assessment & Plan  1.  MALIGNANT NEOPLASM OF RIGHT BREAST, STAGE 1, ESTROGEN RECEPTOR POSITIVE (C50.911)  Story: Right breast biiopsy: 04/13/2017 ([id number]) - UIQ mass at 1 o'clock - IDC, grade 3, ER - 100%, PR - 70%, Ki67 - 60%, Her2Neu - POSITIVE .Marland KitchenMarland Kitchen UOQ Ca++ - fibroadenoma  Right breast lumpectomy x 2 and right axillary targeted/SLNBx on 12/09/2017 ([id number]) - 1) intraductal papilloma, 2) IDC, 2.0 cm, 0.1 cm from superior margin (additional excised margin okay), 1/6 nodes  On Anastrazole  Oncology - [place]) and [place]  2.  PORT-A-CATH IN PLACE [phone]) - placed - 05/25/2017 - D. Alexiana Laverdure  Plan:  1. Schedule removal of port  3. HTN - for 15 years 4. Asthma - seasonal - mainly winter 5. History of kidney stones Sees Dr. Leo Grosser in [place] 6. "wall eyes" - exotropia 7. Heart murmur - 3/6 remote echo 8. History of ocular myasthenia gravis She took steroids for a long time for this, but stopped about 20 years ago.   Alphonsa Overall, MD, [facility] Surgery Office phone:  [phone]

## 2020-05-10 ENCOUNTER — Ambulatory Visit (HOSPITAL_BASED_OUTPATIENT_CLINIC_OR_DEPARTMENT_OTHER)
Admission: RE | Admit: 2020-05-10 | Discharge: 2020-05-10 | Disposition: A | Discharge status: Home / Self Care | Payer: 59 | Attending: Surgery | Admitting: Surgery

## 2020-05-10 ENCOUNTER — Encounter (HOSPITAL_BASED_OUTPATIENT_CLINIC_OR_DEPARTMENT_OTHER)
Admission: RE | Disposition: A | Discharge status: Home / Self Care | Payer: Self-pay | Source: Home / Self Care | Attending: Surgery

## 2020-05-10 ENCOUNTER — Encounter (HOSPITAL_BASED_OUTPATIENT_CLINIC_OR_DEPARTMENT_OTHER): Payer: Self-pay | Admitting: Surgery

## 2020-05-10 DIAGNOSIS — I10 Essential (primary) hypertension: Secondary | ICD-10-CM | POA: Diagnosis not present

## 2020-05-10 DIAGNOSIS — Z452 Encounter for adjustment and management of vascular access device: Secondary | ICD-10-CM | POA: Diagnosis not present

## 2020-05-10 DIAGNOSIS — C50211 Malignant neoplasm of upper-inner quadrant of right female breast: Secondary | ICD-10-CM | POA: Diagnosis not present

## 2020-05-10 DIAGNOSIS — Z888 Allergy status to other drugs, medicaments and biological substances status: Secondary | ICD-10-CM | POA: Insufficient documentation

## 2020-05-10 DIAGNOSIS — Z79899 Other long term (current) drug therapy: Secondary | ICD-10-CM | POA: Diagnosis not present

## 2020-05-10 DIAGNOSIS — Z91040 Latex allergy status: Secondary | ICD-10-CM | POA: Insufficient documentation

## 2020-05-10 DIAGNOSIS — Z7982 Long term (current) use of aspirin: Secondary | ICD-10-CM | POA: Insufficient documentation

## 2020-05-10 DIAGNOSIS — Z79811 Long term (current) use of aromatase inhibitors: Secondary | ICD-10-CM | POA: Diagnosis not present

## 2020-05-10 DIAGNOSIS — J45909 Unspecified asthma, uncomplicated: Secondary | ICD-10-CM | POA: Insufficient documentation

## 2020-05-10 DIAGNOSIS — Z7984 Long term (current) use of oral hypoglycemic drugs: Secondary | ICD-10-CM | POA: Insufficient documentation

## 2020-05-10 DIAGNOSIS — G7000 Myasthenia gravis without (acute) exacerbation: Secondary | ICD-10-CM | POA: Insufficient documentation

## 2020-05-10 HISTORY — PX: PORT-A-CATH REMOVAL: SHX5289

## 2020-05-10 SURGERY — MINOR REMOVAL PORT-A-CATH
Anesthesia: LOCAL | Site: Chest | Laterality: Left

## 2020-05-10 MED ORDER — SODIUM BICARBONATE 4 % IV SOLN
INTRAVENOUS | Status: DC | PRN
  Administered 2020-05-10: 5 mL via INTRAVENOUS

## 2020-05-10 MED ORDER — LIDOCAINE-EPINEPHRINE (PF) 1 %-1:200000 IJ SOLN
INTRAMUSCULAR | Status: DC | PRN
  Administered 2020-05-10: 30 mL

## 2020-05-10 MED ORDER — CHLORHEXIDINE GLUCONATE 4 % EX LIQD: 60.0000 mL | Freq: Once | CUTANEOUS | Status: DC

## 2020-05-10 SURGICAL SUPPLY — 35 items
ADH SKN CLS APL DERMABOND .7 (GAUZE/BANDAGES/DRESSINGS) ×2
APL PRP STRL LF DISP 70% ISPRP (MISCELLANEOUS) ×2
APL SKNCLS STERI-STRIP NONHPOA (GAUZE/BANDAGES/DRESSINGS)
BENZOIN TINCTURE PRP APPL 2/3 (GAUZE/BANDAGES/DRESSINGS) IMPLANT
BLADE SURG 15 STRL LF DISP TIS (BLADE) ×2 IMPLANT
BLADE SURG 15 STRL SS (BLADE) ×3
CHLORAPREP W/TINT 26 (MISCELLANEOUS) ×3 IMPLANT
COVER BACK TABLE 60X90IN (DRAPES) ×3 IMPLANT
COVER MAYO STAND STRL (DRAPES) ×3 IMPLANT
COVER WAND RF STERILE (DRAPES) IMPLANT
DECANTER SPIKE VIAL GLASS SM (MISCELLANEOUS) IMPLANT
DERMABOND ADVANCED (GAUZE/BANDAGES/DRESSINGS) ×1
DERMABOND ADVANCED .7 DNX12 (GAUZE/BANDAGES/DRESSINGS) ×1 IMPLANT
DRAPE LAPAROTOMY 100X72 PEDS (DRAPES) ×3 IMPLANT
DRAPE UTILITY XL STRL (DRAPES) ×1 IMPLANT
ELECT REM PT RETURN 9FT ADLT (ELECTROSURGICAL) ×3
ELECTRODE REM PT RTRN 9FT ADLT (ELECTROSURGICAL) ×2 IMPLANT
GAUZE SPONGE 4X4 12PLY STRL LF (GAUZE/BANDAGES/DRESSINGS) IMPLANT
GLOVE SURG SYN 7.0 (GLOVE) ×3 IMPLANT
GLOVE SURG SYN 7.0 PF PI (GLOVE) IMPLANT
GLOVE SURG SYN 7.5  E (GLOVE) ×3
GLOVE SURG SYN 7.5 E (GLOVE) ×2 IMPLANT
GLOVE SURG SYN 7.5 PF PI (GLOVE) ×1 IMPLANT
GOWN STRL REUS W/ TWL LRG LVL3 (GOWN DISPOSABLE) ×3 IMPLANT
GOWN STRL REUS W/TWL LRG LVL3 (GOWN DISPOSABLE) ×6
NDL HYPO 25X1 1.5 SAFETY (NEEDLE) ×1 IMPLANT
NEEDLE HYPO 25X1 1.5 SAFETY (NEEDLE) ×3 IMPLANT
PACK BASIN DAY SURGERY FS (CUSTOM PROCEDURE TRAY) ×3 IMPLANT
PENCIL SMOKE EVACUATOR (MISCELLANEOUS) ×3 IMPLANT
SLEEVE SCD COMPRESS KNEE MED (MISCELLANEOUS) IMPLANT
STRIP CLOSURE SKIN 1/4X4 (GAUZE/BANDAGES/DRESSINGS) IMPLANT
SUT MNCRL AB 4-0 PS2 18 (SUTURE) ×3 IMPLANT
SUT VICRYL 3-0 CR8 SH (SUTURE) ×3 IMPLANT
SYR CONTROL 10ML LL (SYRINGE) ×3 IMPLANT
TOWEL GREEN STERILE FF (TOWEL DISPOSABLE) ×3 IMPLANT

## 2020-05-10 NOTE — Op Note (Signed)
05/10/2020  5:45 PM  PATIENT:  Renee Harris, 59 y.o., female MRN: [id number] DOB: December 29, 1960  PREOP DIAGNOSIS:  RIGHT BREAST CANCER, completion of chemotherapy  POSTOP DIAGNOSIS:   Right breast cancer, completion of chemotherapy  PROCEDURE:   Procedure(s):  REMOVAL OF POWER PORT  SURGEON:   Alphonsa Overall, M.D.  ANESTHESIA:  Local       14 cc of 1%  xylocaine  COUNTS CORRECT:  YES  INDICATIONS FOR PROCEDURE:  [name] is a 59 y.o. (DOB: [birth year]) white female whose primary care physician is Sharilyn Sites, MD and comes for removal of a  power port that was placed for the treatment of right breast cancer cancer.  Dr. Delton Coombes is her treating oncologist.  She has now completed her chemotherapy.   The indications and risks of the surgery were explained to the patient.  The risks include, but are not limited to, infection, bleeding, and nerve injury.  OPERATIVE NOTE:  The patient was taken to OR room # 8 at [facility].  Her left chest was prepped with chloroprep and sterilely draped.   A time out was held and the surgery checklist reviewed.   The patient was placed in a supine position.  I infiltrated 14 cc of local around the power port.   I made an incision through the prior power port incision.   I cut down to the tubing and removed this intact.  I noted some calcifications around the tubing track.  I then removed the port intact.  There was no bleeding.   The wounds were then closed with 3-0 vicryl subcutaneous sutures and the skin closed with a 4-0 Monocryl suture.  The skin was painted with Dermabond.   The patient was transferred to the recovery room in good condition.  The sponge and needle count were correct at the end of the case.   Alphonsa Overall, MD, [facility] Surgery Pager: [phone] Office phone:  [phone]

## 2020-05-10 NOTE — Interval H&P Note (Signed)
History and Physical Interval Note:  05/10/2020 2:16 PM  Renee Harris  has presented today for surgery, with the diagnosis of RIGHT BREAST CANCER.  The various methods of treatment have been discussed with the patient and family.   Her husband is with her.  After consideration of risks, benefits and other options for treatment, the patient has consented to  Procedure(s): REMOVAL OF POWER PORT (Left) as a surgical intervention.  The patient's history has been reviewed, patient examined, no change in status, stable for surgery.  I have reviewed the patient's chart and labs.  Questions were answered to the patient's satisfaction.     Shann Medal

## 2020-05-10 NOTE — Discharge Instructions (Signed)
CENTRAL  SURGERY - DISCHARGE INSTRUCTIONS TO PATIENT  Activity:  Driving - May drive                       Practice your Covid-19 protection:  Wear a mask, social distance, and wash your hands frequently  Wound Care:   Leave the incision dry for 2 days, then you may shower  Diet:  As tolerated  Follow up appointment:  Call Dr. Pollie Friar office [facility] Surgery) at [phone] for an appointment in one year.  Call earlier if there are any questions about the wound.  Medications and dosages:  Resume your home medications.             You may also take Tylenol, ibuprofen, or Aleve for pain  Call Dr. Lucia Gaskins or his office  [phone]) if you have:  Temperature greater than 100.4,  Redness, tenderness, or signs of infection (pain, swelling, redness, odor or green/yellow discharge around the site),  Any other questions or concerns you may have after discharge.  In an emergency, call 911 or go to an Emergency Department at a nearby hospital.

## 2020-05-11 ENCOUNTER — Encounter (HOSPITAL_BASED_OUTPATIENT_CLINIC_OR_DEPARTMENT_OTHER): Payer: Self-pay | Admitting: Surgery

## 2020-05-29 ENCOUNTER — Encounter (HOSPITAL_COMMUNITY): Payer: 59

## 2020-05-29 ENCOUNTER — Other Ambulatory Visit (HOSPITAL_COMMUNITY): Payer: 59

## 2020-05-31 ENCOUNTER — Ambulatory Visit (HOSPITAL_COMMUNITY)
Admission: RE | Admit: 2020-05-31 | Discharge: 2020-05-31 | Disposition: A | Discharge status: Home / Self Care | Payer: 59 | Source: Ambulatory Visit | Attending: Nurse Practitioner | Admitting: Nurse Practitioner

## 2020-05-31 ENCOUNTER — Other Ambulatory Visit: Payer: Self-pay

## 2020-05-31 DIAGNOSIS — C50211 Malignant neoplasm of upper-inner quadrant of right female breast: Secondary | ICD-10-CM | POA: Insufficient documentation

## 2020-05-31 DIAGNOSIS — Z170 Estrogen receptor positive status [ER+]: Secondary | ICD-10-CM | POA: Diagnosis present

## 2020-06-05 ENCOUNTER — Ambulatory Visit (HOSPITAL_COMMUNITY): Payer: 59 | Admitting: Nurse Practitioner

## 2020-07-09 ENCOUNTER — Other Ambulatory Visit (HOSPITAL_COMMUNITY): Payer: Self-pay | Admitting: Nurse Practitioner

## 2020-09-18 ENCOUNTER — Inpatient Hospital Stay (HOSPITAL_COMMUNITY): Payer: 59

## 2020-09-25 ENCOUNTER — Ambulatory Visit (HOSPITAL_COMMUNITY): Payer: 59 | Admitting: Oncology

## 2020-10-02 ENCOUNTER — Other Ambulatory Visit (HOSPITAL_COMMUNITY): Payer: Self-pay

## 2020-10-02 DIAGNOSIS — C50211 Malignant neoplasm of upper-inner quadrant of right female breast: Secondary | ICD-10-CM

## 2020-10-03 ENCOUNTER — Other Ambulatory Visit: Payer: Self-pay

## 2020-10-03 ENCOUNTER — Inpatient Hospital Stay (HOSPITAL_COMMUNITY): Payer: 59 | Attending: Medical

## 2020-10-03 DIAGNOSIS — G629 Polyneuropathy, unspecified: Secondary | ICD-10-CM | POA: Insufficient documentation

## 2020-10-03 DIAGNOSIS — Z79811 Long term (current) use of aromatase inhibitors: Secondary | ICD-10-CM | POA: Diagnosis not present

## 2020-10-03 DIAGNOSIS — D696 Thrombocytopenia, unspecified: Secondary | ICD-10-CM | POA: Insufficient documentation

## 2020-10-03 DIAGNOSIS — G4730 Sleep apnea, unspecified: Secondary | ICD-10-CM | POA: Diagnosis not present

## 2020-10-03 DIAGNOSIS — Z923 Personal history of irradiation: Secondary | ICD-10-CM | POA: Diagnosis not present

## 2020-10-03 DIAGNOSIS — M8580 Other specified disorders of bone density and structure, unspecified site: Secondary | ICD-10-CM | POA: Diagnosis not present

## 2020-10-03 DIAGNOSIS — E119 Type 2 diabetes mellitus without complications: Secondary | ICD-10-CM | POA: Insufficient documentation

## 2020-10-03 DIAGNOSIS — K219 Gastro-esophageal reflux disease without esophagitis: Secondary | ICD-10-CM | POA: Insufficient documentation

## 2020-10-03 DIAGNOSIS — C50211 Malignant neoplasm of upper-inner quadrant of right female breast: Secondary | ICD-10-CM | POA: Insufficient documentation

## 2020-10-03 DIAGNOSIS — J45909 Unspecified asthma, uncomplicated: Secondary | ICD-10-CM | POA: Diagnosis not present

## 2020-10-03 DIAGNOSIS — Z170 Estrogen receptor positive status [ER+]: Secondary | ICD-10-CM | POA: Insufficient documentation

## 2020-10-03 DIAGNOSIS — E785 Hyperlipidemia, unspecified: Secondary | ICD-10-CM | POA: Insufficient documentation

## 2020-10-03 DIAGNOSIS — Z801 Family history of malignant neoplasm of trachea, bronchus and lung: Secondary | ICD-10-CM | POA: Diagnosis not present

## 2020-10-03 DIAGNOSIS — I10 Essential (primary) hypertension: Secondary | ICD-10-CM | POA: Insufficient documentation

## 2020-10-03 DIAGNOSIS — Z87442 Personal history of urinary calculi: Secondary | ICD-10-CM | POA: Diagnosis not present

## 2020-10-03 DIAGNOSIS — Z9221 Personal history of antineoplastic chemotherapy: Secondary | ICD-10-CM | POA: Diagnosis not present

## 2020-10-03 LAB — COMPREHENSIVE METABOLIC PANEL
ALT: 69 U/L — ABNORMAL HIGH (ref 0–44)
AST: 77 U/L — ABNORMAL HIGH (ref 15–41)
Albumin: 4.1 g/dL (ref 3.5–5.0)
Alkaline Phosphatase: 109 U/L (ref 38–126)
Anion gap: 12 (ref 5–15)
BUN: 16 mg/dL (ref 6–20)
CO2: 23 mmol/L (ref 22–32)
Calcium: 11.1 mg/dL — ABNORMAL HIGH (ref 8.9–10.3)
Chloride: 101 mmol/L (ref 98–111)
Creatinine, Ser: 0.93 mg/dL (ref 0.44–1.00)
GFR, Estimated: >60 mL/min (ref >60)
Glucose, Bld: 302 mg/dL — ABNORMAL HIGH (ref 70–99)
Potassium: 4.2 mmol/L (ref 3.5–5.1)
Sodium: 136 mmol/L (ref 135–145)
Total Bilirubin: 0.7 mg/dL (ref 0.3–1.2)
Total Protein: 7.8 g/dL (ref 6.5–8.1)

## 2020-10-03 LAB — CBC WITH DIFFERENTIAL/PLATELET
Abs Immature Granulocytes: 0.03 10*3/uL (ref 0.00–0.07)
Basophils Absolute: 0 10*3/uL (ref 0.0–0.1)
Basophils Relative: 1 %
Eosinophils Absolute: 0.2 10*3/uL (ref 0.0–0.5)
Eosinophils Relative: 4 %
HCT: 40.7 % (ref 36.0–46.0)
Hemoglobin: 13.2 g/dL (ref 12.0–15.0)
Immature Granulocytes: 1 %
Lymphocytes Relative: 27 %
Lymphs Abs: 1.1 10*3/uL (ref 0.7–4.0)
MCH: 28.3 pg (ref 26.0–34.0)
MCHC: 32.4 g/dL (ref 30.0–36.0)
MCV: 87.2 fL (ref 80.0–100.0)
Monocytes Absolute: 0.3 10*3/uL (ref 0.1–1.0)
Monocytes Relative: 7 %
Neutro Abs: 2.5 10*3/uL (ref 1.7–7.7)
Neutrophils Relative %: 60 %
Platelets: 132 10*3/uL — ABNORMAL LOW (ref 150–400)
RBC: 4.67 MIL/uL (ref 3.87–5.11)
RDW: 14.2 % (ref 11.5–15.5)
WBC: 4.2 10*3/uL (ref 4.0–10.5)
nRBC: 0 % (ref 0.0–0.2)

## 2020-10-03 LAB — VITAMIN B12: Vitamin B-12: 441 pg/mL (ref 180–914)

## 2020-10-03 LAB — VITAMIN D 25 HYDROXY (VIT D DEFICIENCY, FRACTURES): Vit D, 25-Hydroxy: 37.26 ng/mL (ref 30–100)

## 2020-10-03 LAB — LACTATE DEHYDROGENASE: LDH: 118 U/L (ref 98–192)

## 2020-10-03 LAB — FOLATE: Folate: 13.9 ng/mL (ref >5.9)

## 2020-10-10 ENCOUNTER — Ambulatory Visit (HOSPITAL_COMMUNITY): Admission: RE | Admit: 2020-10-10 | Payer: 59 | Source: Ambulatory Visit

## 2020-10-10 ENCOUNTER — Encounter (HOSPITAL_COMMUNITY): Payer: 59 | Admitting: Cardiology

## 2020-10-11 ENCOUNTER — Other Ambulatory Visit: Payer: Self-pay

## 2020-10-11 ENCOUNTER — Inpatient Hospital Stay (HOSPITAL_BASED_OUTPATIENT_CLINIC_OR_DEPARTMENT_OTHER): Payer: 59 | Admitting: Oncology

## 2020-10-11 VITALS — HR 98 | Temp 97.2°F | Resp 18 | Wt 248.0 lb

## 2020-10-11 DIAGNOSIS — Z170 Estrogen receptor positive status [ER+]: Secondary | ICD-10-CM

## 2020-10-11 DIAGNOSIS — C50211 Malignant neoplasm of upper-inner quadrant of right female breast: Secondary | ICD-10-CM

## 2020-10-11 NOTE — Progress Notes (Signed)
Renee Harris, [place] [address]   CLINIC:  Medical Oncology/Hematology  PCP:  Sharilyn Sites, [place] [address] [phone]   REASON FOR VISIT: Follow-up for breast cancer   CURRENT THERAPY: Anastrozole  BRIEF ONCOLOGIC HISTORY:  Oncology History  Malignant neoplasm of upper-inner quadrant of right breast in female, estrogen receptor positive ([place])  04/13/2017 Initial Diagnosis   Screening detected right breast mass with calcifications 2.1 cm in size and 3.4 cm in size. Calcifications were fibroadenoma. Right breast biopsy upper inner quadrant 1:00 mass: IDC with DCIS with necrosis, grade 3, ER 100%, PR 70%, Ki-67 60%, HER-2 positive ratio 2.34, T2 N1 stage IB (New AJCC)   04/25/2017 Breast MRI   Right breast upper inner quadrant 2.2 x 1.7 x 2.7 cm mass, abnormal area of clumped non-mass enhancement in the right lateral breast 3.2 x 1.9 x 1 cm, too abnormal lymph nodes right axilla 4.3 and 1.7 cm (biopsy-proven breast cancer)    05/08/2017 Procedure   Right axilla lymph node biopsy: Metastatic carcinoma   05/13/2017 Procedure   Right breast biopsy retroareolar region: Intraductal papilloma   01/20/2018 -  Chemotherapy   The patient had ado-trastuzumab emtansine (KADCYLA) 420 mg in sodium chloride 0.9 % 250 mL chemo infusion, 3.6 mg/kg = 420 mg, Intravenous, Once, 14 of 14 cycles Administration: 420 mg (01/20/2018), 420 mg (02/10/2018), 420 mg (04/14/2018), 420 mg (03/03/2018), 420 mg (03/24/2018), 420 mg (05/05/2018), 420 mg (05/26/2018), 420 mg (06/16/2018), 420 mg (07/08/2018), 420 mg (07/29/2018), 420 mg (08/19/2018), 420 mg (09/13/2018), 420 mg (10/08/2018), 420 mg (11/04/2018)  for chemotherapy treatment.      CANCER STAGING: Cancer Staging Malignant neoplasm of upper-inner quadrant of right breast in female, estrogen receptor positive ([place]) Staging form: Breast, AJCC 8th Edition - Clinical stage from 04/22/2017: Stage IB (cT2,  cN0, cM0, G3, ER: Positive, PR: Positive, HER2: Positive) - Unsigned    INTERVAL HISTORY:  Renee Harris 59 y.o. female returns for routine follow-up for breast cancer.  She was last seen in our clinic on 02/29/2020.  In the interim she had her port removed by Dr. Lucia Gaskins on 05/10/2020.  She denies any new lumps or bumps.  She denies any new bone pain.  She had a mammogram on 05/31/2020 which showed a 9 mm lymph node in low axilla which appeared new.  Recommended repeat ultrasound and mammogram in 6 months.  BI-RADS Category 3 likely benign. Denies any nausea, vomiting, or diarrhea. Denies any new pains. Had not noticed any recent bleeding such as epistaxis, hematuria or hematochezia. Denies recent chest pain on exertion, shortness of breath on minimal exertion, pre-syncopal episodes, or palpitations. Denies any numbness or tingling in hands or feet. Denies any recent fevers, infections, or recent hospitalizations. Patient reports appetite at 100% and energy level at 75%.  She is eating well maintain her weight at this time.   REVIEW OF SYSTEMS:  Review of Systems  Respiratory: Negative for cough.   Neurological: Negative for dizziness, headaches and numbness.  Psychiatric/Behavioral: Negative for depression and sleep disturbance.  All other systems reviewed and are negative.    PAST MEDICAL/SURGICAL HISTORY:  Past Medical History:  Diagnosis Date  . Asthma   . Blood transfusion    as child  . Breast cancer ([place]) 2018  . Cancer ([place])   . Diabetes ([place])    type 2   . GERD (gastroesophageal reflux disease)   . Headache   . Heart murmur  d/t aortic stenosis   . History of kidney stones   . Hyperlipidemia   . Hypertension   . Normal echocardiogram   . Personal history of chemotherapy 2018  . Personal history of radiation therapy 2018  . Sleep apnea    Past Surgical History:  Procedure Laterality Date  . ABDOMINAL HYSTERECTOMY  2004  . BREAST LUMPECTOMY Right 2018  . BREAST  LUMPECTOMY WITH RADIOACTIVE SEED AND SENTINEL LYMPH NODE BIOPSY Right 12/09/2017   Procedure: RIGHT BREAST LUMPECTOMY WITH RADIOACTIVE SEED AND SENTINEL LYMPH NODE BIOPSY AND RADIOACTIVE SEED TARGETED RIGHT AXILLARY LYMPH NODE EXCISION;  Surgeon: Alphonsa Overall, MD;  Location: [facility];  Service: General;  Laterality: Right;  . CHOLECYSTECTOMY  05/30/2011   Procedure: LAPAROSCOPIC CHOLECYSTECTOMY;  Surgeon: Donato Heinz;  Location: [facility];  Service: General;  Laterality: N/A;  . 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 KIDNEY STONE SURGERY     2017 2-23  . PORT-A-CATH REMOVAL Left 05/10/2020   Procedure: REMOVAL OF POWER PORT;  Surgeon: Alphonsa Overall, MD;  Location: [facility];  Service: General;  Laterality: Left;  . PORTACATH PLACEMENT N/A 05/25/2017   Procedure: INSERTION PORT-A-CATH;  Surgeon: Alphonsa Overall, MD;  Location: [facility];  Service: General;  Laterality: N/A;     SOCIAL HISTORY:  Social History   Socioeconomic History  . Marital status: Married    Spouse name: Not on file  . Number of children: Not on file  . Years of education: Not on file  . Highest education level: Not on file  Occupational History  . Not on file  Tobacco Use  . Smoking status: Never Smoker  . Smokeless tobacco: Never Used  Vaping Use  . Vaping Use: Never used  Substance and Sexual Activity  . Alcohol use: Not Currently  . Drug use: No  . Sexual activity: Yes    Birth control/protection: Surgical  Other Topics Concern  . Not on file  Social History Narrative  . Not on file   Social Determinants of Health   Financial Resource Strain: Not on file  Food Insecurity: Not on file  Transportation Needs: Not on file  Physical Activity: Not on file  Stress: Not on file  Social Connections: Not on file  Intimate Partner Violence: Not on file    FAMILY HISTORY:  Family History  Problem Relation Age of Onset  . Lung cancer Maternal Grandmother   . Anesthesia  problems Neg Hx   . Hypotension Neg Hx   . Malignant hyperthermia Neg Hx   . Pseudochol deficiency Neg Hx     CURRENT MEDICATIONS:  Outpatient Encounter Medications as of 10/11/2020  Medication Sig  . anastrozole (ARIMIDEX) 1 MG tablet TAKE ONE TABLET BY MOUTH ONCE DAILY.  . cholecalciferol (VITAMIN D3) 25 MCG (1000 UT) tablet Take 1,000 Units by mouth daily.  Marland Kitchen escitalopram (LEXAPRO) 20 MG tablet Take 10 mg by mouth at bedtime.   . fenofibrate 160 MG tablet Take 160 mg by mouth at bedtime.   . gabapentin (NEURONTIN) 600 MG tablet Take 600 mg by mouth 3 (three) times daily.  Marland Kitchen losartan (COZAAR) 50 MG tablet Take 25 mg by mouth at bedtime.  . meclizine (ANTIVERT) 25 MG tablet Take 1 tablet (25 mg total) by mouth 3 (three) times daily as needed for dizziness.  . metFORMIN (GLUCOPHAGE) 1000 MG tablet Take 1,000 mg by mouth 2 (two) times daily.  Marland Kitchen omeprazole (PRILOSEC) 40  MG capsule Take 1 capsule (40 mg total) by mouth daily.  . [DISCONTINUED] sitaGLIPtin (JANUVIA) 100 MG tablet Take 100 mg by mouth daily.  Marland Kitchen albuterol (PROVENTIL HFA;VENTOLIN HFA) 108 (90 BASE) MCG/ACT inhaler Inhale 1 puff into the lungs every 6 (six) hours as needed for wheezing or shortness of breath.  (Patient not taking: Reported on 10/11/2020)  . Skin Protectants, Misc. (EUCERIN) cream Apply 1 application topically as needed for dry skin. (Patient not taking: Reported on 10/11/2020)  . [DISCONTINUED] lidocaine-prilocaine (EMLA) cream APPLY TO AFFECTED AREA ONCE AS DIRECTED.  . [DISCONTINUED] metFORMIN (GLUCOPHAGE) 500 MG tablet Take 500 mg by mouth 2 (two) times daily with a meal.   No facility-administered encounter medications on file as of 10/11/2020.    ALLERGIES:  Allergies  Allergen Reactions  . Latex Itching  . Pyridostigmine Bromide Other (See Comments)    Makes muscles twitch  . Statins Nausea Only     PHYSICAL EXAM:  ECOG Performance status: 1  Vitals:   10/11/20 0903  Pulse: 98  Resp: 18   Temp: (!) 97.2 F (36.2 C)  SpO2: 96%   Filed Weights   10/11/20 0903  Weight: 248 lb (112.5 kg)   Physical Exam Constitutional:      Appearance: Normal appearance. She is normal weight.  Cardiovascular:     Rate and Rhythm: Normal rate and regular rhythm.     Heart sounds: Normal heart sounds.  Pulmonary:     Effort: Pulmonary effort is normal.     Breath sounds: Normal breath sounds.  Abdominal:     General: Bowel sounds are normal.     Palpations: Abdomen is soft.  Musculoskeletal:        General: Normal range of motion.  Skin:    General: Skin is warm.  Neurological:     Mental Status: She is alert and oriented to person, place, and time. Mental status is at baseline.  Psychiatric:        Mood and Affect: Mood normal.        Behavior: Behavior normal.        Thought Content: Thought content normal.        Judgment: Judgment normal.   Breast: Right: Surgical site well-healed with scar tissue.  Does have mild lymphedema on underside of breast.  Patient reports it is not painful.  No palpable masses Left: No palpable masses, no nipple drainage, skin within normal limits.   LABORATORY DATA:  I have reviewed the labs as listed.  CBC    Component Value Date/Time   WBC 4.2 10/03/2020 1018   RBC 4.67 10/03/2020 1018   HGB 13.2 10/03/2020 1018   HGB 13.8 04/22/2017 0831   HCT 40.7 10/03/2020 1018   HCT 41.9 04/22/2017 0831   PLT 132 (L) 10/03/2020 1018   PLT 184 04/22/2017 0831   MCV 87.2 10/03/2020 1018   MCV 85.7 04/22/2017 0831   MCH 28.3 10/03/2020 1018   MCHC 32.4 10/03/2020 1018   RDW 14.2 10/03/2020 1018   RDW 13.9 04/22/2017 0831   LYMPHSABS 1.1 10/03/2020 1018   LYMPHSABS 2.1 04/22/2017 0831   MONOABS 0.3 10/03/2020 1018   MONOABS 0.5 04/22/2017 0831   EOSABS 0.2 10/03/2020 1018   EOSABS 0.3 04/22/2017 0831   BASOSABS 0.0 10/03/2020 1018   BASOSABS 0.0 04/22/2017 0831   CMP Latest Ref Rng & Units 10/03/2020 02/21/2020 07/28/2019  Glucose 70 - 99  mg/dL 302(H) 278(H) 242(H)  BUN 6 - 20 mg/dL 16 21(H)  25(H)  Creatinine 0.44 - 1.00 mg/dL 0.93 0.92 0.94  Sodium 135 - 145 mmol/L 136 135 139  Potassium 3.5 - 5.1 mmol/L 4.2 4.1 4.2  Chloride 98 - 111 mmol/L 101 101 103  CO2 22 - 32 mmol/L '23 25 25  ' Calcium 8.9 - 10.3 mg/dL 11.1(H) 10.9(H) 10.8(H)  Total Protein 6.5 - 8.1 g/dL 7.8 7.8 7.6  Total Bilirubin 0.3 - 1.2 mg/dL 0.7 0.6 0.5  Alkaline Phos 38 - 126 U/L 109 124 124  AST 15 - 41 U/L 77(H) 61(H) 43(H)  ALT 0 - 44 U/L 69(H) 57(H) 40   All questions were answered to patient's stated satisfaction. Encouraged patient to call with any new concerns or questions before his next visit to the cancer center and we can certain see him sooner, if needed.     ASSESSMENT & PLAN:  1.  Clinical T2N0 HER-2 positive breast cancer: -Biopsy of the right breast upper inner quadrant 1 o'clock position mass, IDC, ER/PR positive, HER-2 positive, Ki 67 to 60%. -Neoadjuvant chemotherapy with 6 cycles of TCHP from 05/29/2017 through 10/09/2017. -Right lumpectomy and lymph node biopsy on 12/09/2017, with lumpectomy #1 specimen with no malignancy, lumpectomy #2 specimen showing 2 cm IDC, grade 3, 1 out of 6 lymph nodes positive for malignancy. -Based on [place] trial, started on Kadcyla 3.6 mcg/kg for 14 cycle started on 01/20/2018 through 11/04/2018. -She was unable to tolerate letrozole due to chest pain and right lateral abdominal pain. -She was then placed on anastrozole on 05/05/2018 she is tolerating this well. -Last mammogram from 05/31/2020 showed new 9 mm axillary lymph node.  Targeted ultrasound performed.  Recommend repeat ultrasound and mammogram in 6 months. -Recommended patient continue on anastrozole. -Repeat left diagnostic mammogram with possible ultrasound in February 2021 -She will follow-up in 6 months with repeat labs and bilateral mammogram in August 2022.   2.  Peripheral neuropathy: -She is continue taking gabapentin 600 mg 3 times a  day. -Neuropathy is stable.  3.  Mild hypercalcemia: -She has mild hypercalcemia dating back to 2012. -She continues taking vitamin D but does not take calcium supplements due to kidney stones. -PTH was in normal range last checked at 35. -Labs done 10/03/2020 show a calcium of 11.1.   4.  Osteopenia: -DEXA scan on 08/25/2018 showed T score of -1.5. -She is continue taking vitamin D supplements. -Patient needs a repeat DEXA scan.  5.  Mild to moderate thrombocytopenia: -She has mild to moderate decrease in platelets since August 2018. -CT scan in May 2019 showed normal-sized spleen with a fatty liver. -This could be a immune mediated thrombocytopenia. -We will closely monitor it. -Labs done on 10/03/2020 show platelet count of 132 (135).  6.  Elevated liver enzymes: -CT scan on May 2019 showed fatty liver. -Her PCP is following her liver enzymes closely. -Labs done on 10/03/2020 show an AST of 77 and ALT of 69.  Disposition: -Repeat bone density ASAP -Repeat left mammogram and u/s in Feb 2021.  -RTC in 6 months with repeat labs (CBC, CMP, LDH, vitamin B-12, folate and vitamin D) and MD assessment  No problem-specific Assessment & Plan notes found for this encounter.  Orders placed this encounter:  Orders Placed This Encounter  Procedures  . DG Bone Density  . MM DIAG BREAST TOMO UNI LEFT  . US Breast Limited Uni Left Inc Axilla   Waylyn Tenbrink, NP 10/11/2020 9:39 AM  [place] [phone]

## 2020-10-18 ENCOUNTER — Other Ambulatory Visit (HOSPITAL_COMMUNITY): Payer: 59

## 2020-11-02 ENCOUNTER — Other Ambulatory Visit (HOSPITAL_COMMUNITY): Payer: Self-pay

## 2020-11-02 DIAGNOSIS — Z170 Estrogen receptor positive status [ER+]: Secondary | ICD-10-CM

## 2020-11-02 MED ORDER — ANASTROZOLE 1 MG PO TABS: 1.0000 mg | ORAL_TABLET | Freq: Every day | ORAL | 0 refills | Status: DC

## 2020-11-08 ENCOUNTER — Other Ambulatory Visit (HOSPITAL_COMMUNITY): Payer: 59

## 2020-11-22 ENCOUNTER — Other Ambulatory Visit: Payer: Self-pay

## 2020-11-22 ENCOUNTER — Ambulatory Visit (HOSPITAL_COMMUNITY)
Admission: RE | Admit: 2020-11-22 | Discharge: 2020-11-22 | Disposition: A | Discharge status: Home / Self Care | Payer: 59 | Source: Ambulatory Visit | Attending: Oncology | Admitting: Oncology

## 2020-11-22 DIAGNOSIS — Z170 Estrogen receptor positive status [ER+]: Secondary | ICD-10-CM | POA: Diagnosis present

## 2020-11-22 DIAGNOSIS — C50211 Malignant neoplasm of upper-inner quadrant of right female breast: Secondary | ICD-10-CM | POA: Insufficient documentation

## 2020-12-11 ENCOUNTER — Encounter (HOSPITAL_COMMUNITY): Payer: 59 | Admitting: Cardiology

## 2020-12-11 ENCOUNTER — Ambulatory Visit (HOSPITAL_COMMUNITY): Payer: 59

## 2020-12-18 ENCOUNTER — Ambulatory Visit (HOSPITAL_COMMUNITY): Payer: 59

## 2020-12-18 ENCOUNTER — Encounter (HOSPITAL_COMMUNITY): Payer: 59

## 2021-03-27 ENCOUNTER — Encounter (HOSPITAL_COMMUNITY): Payer: Self-pay | Admitting: Internal Medicine

## 2021-04-02 ENCOUNTER — Other Ambulatory Visit (HOSPITAL_COMMUNITY): Payer: Self-pay | Admitting: Hematology and Oncology

## 2021-04-02 ENCOUNTER — Inpatient Hospital Stay (HOSPITAL_COMMUNITY): Payer: Medicaid Other | Attending: Hematology | Admitting: General Practice

## 2021-04-02 DIAGNOSIS — C50211 Malignant neoplasm of upper-inner quadrant of right female breast: Secondary | ICD-10-CM

## 2021-04-02 DIAGNOSIS — Z170 Estrogen receptor positive status [ER+]: Secondary | ICD-10-CM

## 2021-04-02 NOTE — Progress Notes (Signed)
[facility] CSW Progress Notes  Referral for loss of insurance benefits.  Called patient, husband changed jobs "we have not been able to get any insurance."  She has declined to schedule needed follow up visits for her breast cancer due to lack of insurance and inability to pay.  Husband working but does not have benefits at his job.  She had insurance through the [employer].  Insurance ended in  March 2022 when husband reduced his hours due to his physical limitations (back issues/lifting restrictions).  It is unclear why her [employer] insurance premium increased substantially when her household income decreased.  She has Family Planning Medicaid only and does not believe she will qualify for full Medicaid.  Referred to [facility] [facility] network for insurance counseling through the Legal Aid network.  Referred to [facility] for help in accessing any local resources which could help w referrals for specialty care.  Will also send her information on [employer] and [employer] [place] for unpaid medical bills.  Will call her in two weeks to follow up.  Edwyna Shell, LCSW Clinical Social Worker Phone:  [phone]

## 2021-04-03 NOTE — Telephone Encounter (Signed)
Dr Tomie China pt

## 2021-04-04 ENCOUNTER — Other Ambulatory Visit (HOSPITAL_COMMUNITY): Payer: 59

## 2021-04-04 ENCOUNTER — Other Ambulatory Visit (HOSPITAL_COMMUNITY): Payer: Self-pay

## 2021-04-04 DIAGNOSIS — Z170 Estrogen receptor positive status [ER+]: Secondary | ICD-10-CM

## 2021-04-04 MED ORDER — ANASTROZOLE 1 MG PO TABS: 1.0000 mg | ORAL_TABLET | Freq: Every day | ORAL | 0 refills | Status: DC

## 2021-04-04 NOTE — Telephone Encounter (Signed)
See Faythe Casa, NP note from 10/11/2020. Order for refill on anastrozole placed.

## 2021-04-11 ENCOUNTER — Ambulatory Visit (HOSPITAL_COMMUNITY): Payer: 59 | Admitting: Hematology

## 2021-04-23 ENCOUNTER — Other Ambulatory Visit (HOSPITAL_COMMUNITY): Payer: Medicaid Other | Admitting: General Practice

## 2021-04-23 ENCOUNTER — Encounter (HOSPITAL_COMMUNITY): Payer: Self-pay | Admitting: General Practice

## 2021-04-23 NOTE — Progress Notes (Signed)
[facility] CSW Progress Notes  Called patient to follow up on insurance concerns, referrals and financial assistance applications/paperwork.  No answer, left VM w my contact information and encouragement to call back.    Email from [facility] - they have been unable to reach her by phone.  They are willing to assist as they are able.  Edwyna Shell, LCSW Clinical Social Worker Phone:  [phone]

## 2021-08-27 LAB — HEPATIC FUNCTION PANEL
ALT: 70 U/L — AB (ref 7–35)
AST: 78 — AB (ref 13–35)

## 2021-08-27 LAB — COMPREHENSIVE METABOLIC PANEL
Calcium: 10.7 (ref 8.7–10.7)
eGFR: 85

## 2021-08-27 LAB — BASIC METABOLIC PANEL
BUN: 20 (ref 4–21)
Creatinine: 0.8 (ref 0.5–1.1)
Glucose: 350

## 2021-08-27 LAB — TSH: TSH: 2.11 (ref 0.41–5.90)

## 2021-08-27 LAB — MICROALBUMIN, URINE: Microalb, Ur: 34

## 2021-08-28 LAB — LIPID PANEL
LDL Cholesterol: 97
Triglycerides: 681 — AB (ref 40–160)

## 2021-09-19 ENCOUNTER — Other Ambulatory Visit (HOSPITAL_COMMUNITY): Payer: Self-pay | Admitting: Hematology

## 2021-09-19 DIAGNOSIS — Z170 Estrogen receptor positive status [ER+]: Secondary | ICD-10-CM

## 2021-12-10 ENCOUNTER — Other Ambulatory Visit (HOSPITAL_COMMUNITY): Payer: Self-pay | Admitting: Hematology

## 2021-12-10 DIAGNOSIS — Z9889 Other specified postprocedural states: Secondary | ICD-10-CM

## 2021-12-10 DIAGNOSIS — R928 Other abnormal and inconclusive findings on diagnostic imaging of breast: Secondary | ICD-10-CM

## 2021-12-24 ENCOUNTER — Encounter (HOSPITAL_COMMUNITY): Payer: Self-pay | Admitting: Internal Medicine

## 2021-12-24 LAB — HEMOGLOBIN A1C: Hemoglobin A1C: 9

## 2021-12-30 ENCOUNTER — Other Ambulatory Visit (HOSPITAL_COMMUNITY): Payer: Self-pay | Admitting: Hematology

## 2021-12-30 ENCOUNTER — Encounter (HOSPITAL_COMMUNITY): Payer: Self-pay | Admitting: Internal Medicine

## 2021-12-30 DIAGNOSIS — C50211 Malignant neoplasm of upper-inner quadrant of right female breast: Secondary | ICD-10-CM

## 2022-01-01 ENCOUNTER — Encounter (HOSPITAL_COMMUNITY): Payer: Self-pay | Admitting: Internal Medicine

## 2022-01-02 ENCOUNTER — Inpatient Hospital Stay (HOSPITAL_COMMUNITY): Admission: RE | Admit: 2022-01-02 | Payer: Medicaid Other | Source: Ambulatory Visit

## 2022-01-02 ENCOUNTER — Ambulatory Visit (HOSPITAL_COMMUNITY): Payer: Commercial Managed Care - PPO

## 2022-01-02 ENCOUNTER — Inpatient Hospital Stay (HOSPITAL_COMMUNITY): Payer: Commercial Managed Care - PPO

## 2022-01-08 ENCOUNTER — Other Ambulatory Visit: Payer: Self-pay

## 2022-01-08 ENCOUNTER — Ambulatory Visit (INDEPENDENT_AMBULATORY_CARE_PROVIDER_SITE_OTHER): Payer: Commercial Managed Care - PPO | Admitting: Nurse Practitioner

## 2022-01-08 ENCOUNTER — Encounter: Payer: Self-pay | Admitting: Nurse Practitioner

## 2022-01-08 VITALS — BP 117/77 | HR 94 | Ht 61.5 in | Wt 231.0 lb

## 2022-01-08 DIAGNOSIS — E1165 Type 2 diabetes mellitus with hyperglycemia: Secondary | ICD-10-CM | POA: Diagnosis not present

## 2022-01-08 DIAGNOSIS — E782 Mixed hyperlipidemia: Secondary | ICD-10-CM | POA: Diagnosis not present

## 2022-01-08 DIAGNOSIS — I10 Essential (primary) hypertension: Secondary | ICD-10-CM

## 2022-01-08 MED ORDER — ONETOUCH VERIO VI STRP: ORAL_STRIP | 12 refills | Status: DC

## 2022-01-08 MED ORDER — JANUMET 50-500 MG PO TABS: 1.0000 | ORAL_TABLET | Freq: Two times a day (BID) | ORAL | 3 refills | Status: DC

## 2022-01-08 NOTE — Patient Instructions (Signed)

## 2022-01-08 NOTE — Progress Notes (Addendum)
? ?                                                    Endocrinology Consult Note  ?     01/08/2022, 10:48 AM ? ? ?Subjective:  ? ? Patient ID: Renee Harris, female    DOB: [birth year].  ?Renee Harris is being seen in consultation for management of currently uncontrolled symptomatic diabetes requested by  Sharilyn Sites, MD. ? ? ?Past Medical History:  ?Diagnosis Date  ? Asthma   ? Blood transfusion   ? as child  ? Breast cancer ([place]) 2018  ? Cancer [facility])   ? Diabetes ([place])   ? type 2   ? GERD (gastroesophageal reflux disease)   ? Headache   ? Heart murmur   ? d/t aortic stenosis   ? History of kidney stones   ? Hyperlipidemia   ? Hypertension   ? Normal echocardiogram   ? Personal history of chemotherapy 2018  ? Personal history of radiation therapy 2018  ? Sleep apnea   ? ? ?Past Surgical History:  ?Procedure Laterality Date  ? ABDOMINAL HYSTERECTOMY  2004  ? BREAST LUMPECTOMY Right 2018  ? BREAST LUMPECTOMY WITH RADIOACTIVE SEED AND SENTINEL LYMPH NODE BIOPSY Right 12/09/2017  ? Procedure: RIGHT BREAST LUMPECTOMY WITH RADIOACTIVE SEED AND SENTINEL LYMPH NODE BIOPSY AND RADIOACTIVE SEED TARGETED RIGHT AXILLARY LYMPH NODE EXCISION;  Surgeon: Alphonsa Overall, MD;  Location: [place];  Service: General;  Laterality: Right;  ? CHOLECYSTECTOMY  05/30/2011  ? Procedure: LAPAROSCOPIC CHOLECYSTECTOMY;  Surgeon: Donato Heinz;  Location: [facility];  Service: General;  Laterality: N/A;  ? EYE SURGERY    ? 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, on both eyes  ? KIDNEY STONE SURGERY    ? 2017 2-23  ? PORT-A-CATH REMOVAL Left 05/10/2020  ? Procedure: REMOVAL OF POWER PORT;  Surgeon: Alphonsa Overall, MD;  Location: [place];  Service: General;  Laterality: Left;  ? PORTACATH PLACEMENT N/A 05/25/2017  ? Procedure: INSERTION PORT-A-CATH;  Surgeon: Alphonsa Overall, MD;  Location: [facility];  Service: General;  Laterality: N/A;  ? ? ?Social History  ? ?Socioeconomic History  ? Marital status: Married  ?  Spouse name:  Not on file  ? Number of children: Not on file  ? Years of education: Not on file  ? Highest education level: Not on file  ?Occupational History  ? Not on file  ?Tobacco Use  ? Smoking status: Never  ? Smokeless tobacco: Never  ?Vaping Use  ? Vaping Use: Never used  ?Substance and Sexual Activity  ? Alcohol use: Not Currently  ? Drug use: No  ? Sexual activity: Yes  ?  Birth control/protection: Surgical  ?Other Topics Concern  ? Not on file  ?Social History Narrative  ? Not on file  ? ?Social Determinants of Health  ? ?Financial Resource Strain: Not on file  ?Food Insecurity: Not on file  ?Transportation Needs: Not on file  ?Physical Activity: Not on file  ?Stress: Not on file  ?Social Connections: Not on file  ? ? ?Family History  ?Problem Relation Age of Onset  ? Hypertension Mother   ? Diabetes Mother   ? Heart attack Mother   ? Hypertension Father   ? Diabetes Father   ? Lung cancer Maternal Grandmother   ?  Anesthesia problems Neg Hx   ? Hypotension Neg Hx   ? Malignant hyperthermia Neg Hx   ? Pseudochol deficiency Neg Hx   ? ? ?Outpatient Encounter Medications as of 01/08/2022  ?Medication Sig  ? albuterol (PROVENTIL HFA;VENTOLIN HFA) 108 (90 BASE) MCG/ACT inhaler Inhale 1 puff into the lungs every 6 (six) hours as needed for wheezing or shortness of breath.  ? anastrozole (ARIMIDEX) 1 MG tablet TAKE (1) TABLET BY MOUTH ONCE DAILY.  ? celecoxib (CELEBREX) 200 MG capsule Take 200 mg by mouth daily as needed.  ? cholecalciferol (VITAMIN D3) 25 MCG (1000 UT) tablet Take 2,000 Units by mouth daily.  ? cyclobenzaprine (FLEXERIL) 10 MG tablet Take 10 mg by mouth every 8 (eight) hours as needed.  ? escitalopram (LEXAPRO) 20 MG tablet Take 10 mg by mouth at bedtime.   ? fenofibrate 160 MG tablet Take 160 mg by mouth at bedtime.   ? glucose blood (ONETOUCH VERIO) test strip Use as instructed to monitor glucose 4 times daily  ? icosapent Ethyl (VASCEPA) 1 g capsule Take 2 g by mouth 2 (two) times daily.  ? losartan  (COZAAR) 50 MG tablet Take 25 mg by mouth at bedtime.  ? meclizine (ANTIVERT) 25 MG tablet Take 1 tablet (25 mg total) by mouth 3 (three) times daily as needed for dizziness.  ? Omega-3 Fatty Acids (FISH OIL) 1200 MG CAPS Take 2,400 mg by mouth daily.  ? omeprazole (PRILOSEC) 40 MG capsule Take 1 capsule (40 mg total) by mouth daily.  ? sitaGLIPtin-metformin (JANUMET) 50-500 MG tablet Take 1 tablet by mouth 2 (two) times daily with a meal.  ? Skin Protectants, Misc. (EUCERIN) cream Apply 1 application. topically as needed for dry skin.  ? [DISCONTINUED] sitaGLIPtin-metformin (JANUMET) 50-500 MG tablet Take 1 tablet by mouth 2 (two) times daily with a meal.  ? [DISCONTINUED] gabapentin (NEURONTIN) 600 MG tablet Take 600 mg by mouth 3 (three) times daily. (Patient not taking: Reported on 01/08/2022)  ? [DISCONTINUED] metFORMIN (GLUCOPHAGE) 1000 MG tablet Take 1,000 mg by mouth 2 (two) times daily. (Patient not taking: Reported on 01/08/2022)  ? ?No facility-administered encounter medications on file as of 01/08/2022.  ? ? ?ALLERGIES: ?Allergies  ?Allergen Reactions  ? Mestinon [Pyridostigmine]   ? Latex Itching  ? Pyridostigmine Bromide Other (See Comments)  ?  Makes muscles twitch  ? Statins Nausea Only  ? ? ?VACCINATION STATUS: ?Immunization History  ?Administered Date(s) Administered  ? Influenza,inj,Quad PF,6+ Mos 08/17/2017, 08/19/2018  ? ? ?Diabetes ?She presents for her initial diabetic visit. She has type 2 diabetes mellitus. Onset time: Diagnosed at approx age of 42. Her disease course has been fluctuating. There are no hypoglycemic associated symptoms. Associated symptoms include fatigue, polydipsia, polyuria and weight loss. There are no hypoglycemic complications. Symptoms are stable. Diabetic complications include nephropathy. Risk factors for coronary artery disease include diabetes mellitus, dyslipidemia, family history, obesity, hypertension and sedentary lifestyle. Current diabetic treatment includes  oral agent (dual therapy). She is compliant with treatment most of the time. Her weight is decreasing steadily. She is following a generally unhealthy diet. When asked about meal planning, she reported none. She has not had a previous visit with a dietitian. She rarely participates in exercise. (She presents today for her consultation with no meter or logs to review.  Her most recent A1c was 9% on 3/14.  She does not routinely monitor glucose at home, does not have a glucose monitor.  She drinks water, diet soda, coffee with  milk and sugar substitute and eats maybe 2 meals per day, skipping breakfast most days.  She does admit to late night snacking.  She does not engage in routine physical activity.  She is UTD on eye exam, never seen podiatry in the past.) An ACE inhibitor/angiotensin II receptor blocker is being taken. She does not see a podiatrist.Eye exam is current.  ?Hyperlipidemia ?This is a chronic problem. The current episode started more than 1 year ago. The problem is uncontrolled. Recent lipid tests were reviewed and are variable. Exacerbating diseases include chronic renal disease, diabetes and obesity. Factors aggravating her hyperlipidemia include fatty foods. Current antihyperlipidemic treatment includes fibric acid derivatives. The current treatment provides mild improvement of lipids. Compliance problems include medication side effects, adherence to diet and adherence to exercise.  Risk factors for coronary artery disease include diabetes mellitus, dyslipidemia, family history, obesity, hypertension and a sedentary lifestyle.  ?Hypertension ?This is a chronic problem. The current episode started more than 1 year ago. The problem has been resolved since onset. The problem is controlled. There are no associated agents to hypertension. Risk factors for coronary artery disease include diabetes mellitus, dyslipidemia, family history, obesity and sedentary lifestyle. Past treatments include angiotensin  blockers. The current treatment provides moderate improvement. Compliance problems include diet and exercise.  Hypertensive end-organ damage includes kidney disease. Identifiable causes of hypertension includ

## 2022-01-09 ENCOUNTER — Inpatient Hospital Stay (HOSPITAL_COMMUNITY): Payer: Commercial Managed Care - PPO | Admitting: Hematology

## 2022-01-13 ENCOUNTER — Telehealth: Payer: Self-pay | Admitting: Nurse Practitioner

## 2022-01-13 NOTE — Telephone Encounter (Signed)
Moved appt

## 2022-01-13 NOTE — Telephone Encounter (Signed)
Pt is calling in regards to her readings, she was a new patient 3/29 and was instructed to test 4x a day. ? ?3/31 374, 305, 306, (no bedtime reading) ? ?4/1 368, (no lunch reading) 331, (no bedtime reading) ? ?4/2 374, 386, 394, 422 ? ?4/3 308 ?

## 2022-01-13 NOTE — Telephone Encounter (Signed)
Can we see if she can come back sooner than next week?  We need to discuss additional agents, possibly insulin.

## 2022-01-14 ENCOUNTER — Encounter: Payer: Self-pay | Admitting: Nurse Practitioner

## 2022-01-14 ENCOUNTER — Ambulatory Visit (INDEPENDENT_AMBULATORY_CARE_PROVIDER_SITE_OTHER): Payer: Commercial Managed Care - PPO | Admitting: Nurse Practitioner

## 2022-01-14 VITALS — BP 132/84 | HR 88 | Ht 61.5 in | Wt 232.8 lb

## 2022-01-14 DIAGNOSIS — E782 Mixed hyperlipidemia: Secondary | ICD-10-CM

## 2022-01-14 DIAGNOSIS — I10 Essential (primary) hypertension: Secondary | ICD-10-CM

## 2022-01-14 DIAGNOSIS — E1165 Type 2 diabetes mellitus with hyperglycemia: Secondary | ICD-10-CM

## 2022-01-14 MED ORDER — BD PEN NEEDLE SHORT U/F 31G X 8 MM MISC: 1.0000 | 3 refills | Status: DC

## 2022-01-14 MED ORDER — INSULIN DEGLUDEC 100 UNIT/ML ~~LOC~~ SOPN: 25.0000 [IU] | PEN_INJECTOR | Freq: Every day | SUBCUTANEOUS | 3 refills | Status: DC

## 2022-01-14 NOTE — Patient Instructions (Signed)

## 2022-01-14 NOTE — Progress Notes (Signed)
? ?                                                    Endocrinology Follow Up Note  ?     01/14/2022, 11:45 AM ? ? ?Subjective:  ? ? Patient ID: Renee Harris, female    DOB: 04/30/1961.  ?ODIS WICKEY is being seen in follow up after being seen in consultation for management of currently uncontrolled symptomatic diabetes requested by  Sharilyn Sites, MD. ? ? ?Past Medical History:  ?Diagnosis Date  ? Asthma   ? Blood transfusion   ? as child  ? Breast cancer ([place]) 2018  ? Cancer [facility])   ? Diabetes ([place])   ? type 2   ? GERD (gastroesophageal reflux disease)   ? Headache   ? Heart murmur   ? d/t aortic stenosis   ? History of kidney stones   ? Hyperlipidemia   ? Hypertension   ? Normal echocardiogram   ? Personal history of chemotherapy 2018  ? Personal history of radiation therapy 2018  ? Sleep apnea   ? ? ?Past Surgical History:  ?Procedure Laterality Date  ? ABDOMINAL HYSTERECTOMY  2004  ? BREAST LUMPECTOMY Right 2018  ? BREAST LUMPECTOMY WITH RADIOACTIVE SEED AND SENTINEL LYMPH NODE BIOPSY Right 12/09/2017  ? Procedure: RIGHT BREAST LUMPECTOMY WITH RADIOACTIVE SEED AND SENTINEL LYMPH NODE BIOPSY AND RADIOACTIVE SEED TARGETED RIGHT AXILLARY LYMPH NODE EXCISION;  Surgeon: Alphonsa Overall, MD;  Location: [place];  Service: General;  Laterality: Right;  ? CHOLECYSTECTOMY  05/30/2011  ? Procedure: LAPAROSCOPIC CHOLECYSTECTOMY;  Surgeon: Donato Heinz;  Location: [facility];  Service: General;  Laterality: N/A;  ? EYE SURGERY    ? 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, on both eyes  ? KIDNEY STONE SURGERY    ? 2017 2-23  ? PORT-A-CATH REMOVAL Left 05/10/2020  ? Procedure: REMOVAL OF POWER PORT;  Surgeon: Alphonsa Overall, MD;  Location: [place];  Service: General;  Laterality: Left;  ? PORTACATH PLACEMENT N/A 05/25/2017  ? Procedure: INSERTION PORT-A-CATH;  Surgeon: Alphonsa Overall, MD;  [facility];  Service: General;  Laterality: N/A;  ? ? ?Social History  ? ?Socioeconomic History  ? Marital  status: Married  ?  Spouse name: Not on file  ? Number of children: Not on file  ? Years of education: Not on file  ? Highest education level: Not on file  ?Occupational History  ? Not on file  ?Tobacco Use  ? Smoking status: Never  ? Smokeless tobacco: Never  ?Vaping Use  ? Vaping Use: Never used  ?Substance and Sexual Activity  ? Alcohol use: Not Currently  ? Drug use: No  ? Sexual activity: Yes  ?  Birth control/protection: Surgical  ?Other Topics Concern  ? Not on file  ?Social History Narrative  ? Not on file  ? ?Social Determinants of Health  ? ?Financial Resource Strain: Not on file  ?Food Insecurity: Not on file  ?Transportation Needs: Not on file  ?Physical Activity: Not on file  ?Stress: Not on file  ?Social Connections: Not on file  ? ? ?Family History  ?Problem Relation Age of Onset  ? Hypertension Mother   ? Diabetes Mother   ? Heart attack Mother   ? Hypertension Father   ? Diabetes Father   ?  Lung cancer Maternal Grandmother   ? Anesthesia problems Neg Hx   ? Hypotension Neg Hx   ? Malignant hyperthermia Neg Hx   ? Pseudochol deficiency Neg Hx   ? ? ?Outpatient Encounter Medications as of 01/14/2022  ?Medication Sig  ? albuterol (PROVENTIL HFA;VENTOLIN HFA) 108 (90 BASE) MCG/ACT inhaler Inhale 1 puff into the lungs every 6 (six) hours as needed for wheezing or shortness of breath.  ? anastrozole (ARIMIDEX) 1 MG tablet TAKE (1) TABLET BY MOUTH ONCE DAILY.  ? celecoxib (CELEBREX) 200 MG capsule Take 200 mg by mouth daily as needed.  ? cholecalciferol (VITAMIN D3) 25 MCG (1000 UT) tablet Take 2,000 Units by mouth daily.  ? cyclobenzaprine (FLEXERIL) 10 MG tablet Take 10 mg by mouth every 8 (eight) hours as needed.  ? escitalopram (LEXAPRO) 20 MG tablet Take 10 mg by mouth at bedtime.   ? fenofibrate 160 MG tablet Take 160 mg by mouth at bedtime.   ? glucose blood (ONETOUCH VERIO) test strip Use as instructed to monitor glucose 4 times daily  ? icosapent Ethyl (VASCEPA) 1 g capsule Take 2 g by mouth 2 (two)  times daily.  ? insulin degludec (TRESIBA) 100 UNIT/ML FlexTouch Pen Inject 25 Units into the skin at bedtime.  ? Insulin Pen Needle (B-D ULTRAFINE III SHORT PEN) 31G X 8 MM MISC 1 each by Does not apply route as directed.  ? losartan (COZAAR) 50 MG tablet Take 25 mg by mouth at bedtime.  ? meclizine (ANTIVERT) 25 MG tablet Take 1 tablet (25 mg total) by mouth 3 (three) times daily as needed for dizziness.  ? Omega-3 Fatty Acids (FISH OIL) 1200 MG CAPS Take 2,400 mg by mouth daily.  ? omeprazole (PRILOSEC) 40 MG capsule Take 1 capsule (40 mg total) by mouth daily.  ? sitaGLIPtin-metformin (JANUMET) 50-500 MG tablet Take 1 tablet by mouth 2 (two) times daily with a meal.  ? Skin Protectants, Misc. (EUCERIN) cream Apply 1 application. topically as needed for dry skin.  ? ?No facility-administered encounter medications on file as of 01/14/2022.  ? ? ?ALLERGIES: ?Allergies  ?Allergen Reactions  ? Mestinon [Pyridostigmine]   ? Latex Itching  ? Pyridostigmine Bromide Other (See Comments)  ?  Makes muscles twitch  ? Statins Nausea Only  ? ? ?VACCINATION STATUS: ?Immunization History  ?Administered Date(s) Administered  ? Influenza,inj,Quad PF,6+ Mos 08/17/2017, 08/19/2018  ? ? ?Diabetes ?She presents for her follow-up diabetic visit. She has type 2 diabetes mellitus. Onset time: Diagnosed at approx age of 40. Her disease course has been fluctuating. There are no hypoglycemic associated symptoms. Associated symptoms include fatigue, polydipsia, polyuria and weight loss. There are no hypoglycemic complications. Symptoms are stable. Diabetic complications include nephropathy. Risk factors for coronary artery disease include diabetes mellitus, dyslipidemia, family history, obesity, hypertension and sedentary lifestyle. Current diabetic treatment includes oral agent (dual therapy). She is compliant with treatment most of the time. Her weight is decreasing steadily. She is following a generally unhealthy diet. When asked about  meal planning, she reported none. She has not had a previous visit with a dietitian. She rarely participates in exercise. There is no change in her home blood glucose trend. Her breakfast blood glucose range is generally >200 mg/dl. Her lunch blood glucose range is generally >200 mg/dl. Her dinner blood glucose range is generally >200 mg/dl. Her bedtime blood glucose range is generally >200 mg/dl. Her overall blood glucose range is >200 mg/dl. (She presents today with her logs and meter showing  significantly above target glycemic profile overall.  She was not due for another A1c today.  She called in between visits as she is worried about her high readings.  She continues to work on her diet, sees [facility] next week.) An ACE inhibitor/angiotensin II receptor blocker is being taken. She does not see a podiatrist.Eye exam is current.  ?Hyperlipidemia ?This is a chronic problem. The current episode started more than 1 year ago. The problem is uncontrolled. Recent lipid tests were reviewed and are variable. Exacerbating diseases include chronic renal disease, diabetes and obesity. Factors aggravating her hyperlipidemia include fatty foods. Current antihyperlipidemic treatment includes fibric acid derivatives. The current treatment provides mild improvement of lipids. Compliance problems include medication side effects, adherence to diet and adherence to exercise.  Risk factors for coronary artery disease include diabetes mellitus, dyslipidemia, family history, obesity, hypertension and a sedentary lifestyle.  ?Hypertension ?This is a chronic problem. The current episode started more than 1 year ago. The problem has been resolved since onset. The problem is controlled. There are no associated agents to hypertension. Risk factors for coronary artery disease include diabetes mellitus, dyslipidemia, family history, obesity and sedentary lifestyle. Past treatments include angiotensin blockers. The current treatment provides  moderate improvement. Compliance problems include diet and exercise.  Hypertensive end-organ damage includes kidney disease. Identifiable causes of hypertension include chronic renal disease.  ? ? ?Review o

## 2022-01-22 ENCOUNTER — Ambulatory Visit: Payer: Commercial Managed Care - PPO | Admitting: Nutrition

## 2022-01-22 ENCOUNTER — Ambulatory Visit: Payer: Commercial Managed Care - PPO | Admitting: Nurse Practitioner

## 2022-01-29 ENCOUNTER — Ambulatory Visit (HOSPITAL_COMMUNITY)
Admission: RE | Admit: 2022-01-29 | Discharge: 2022-01-29 | Disposition: A | Discharge status: Home / Self Care | Payer: Commercial Managed Care - PPO | Source: Ambulatory Visit | Attending: Hematology | Admitting: Hematology

## 2022-01-29 DIAGNOSIS — Z9889 Other specified postprocedural states: Secondary | ICD-10-CM

## 2022-02-17 ENCOUNTER — Ambulatory Visit: Payer: Commercial Managed Care - PPO | Admitting: Nurse Practitioner

## 2022-02-17 NOTE — Patient Instructions (Incomplete)
Diabetes Mellitus and Foot Care Foot care is an important part of your health, especially when you have diabetes. Diabetes may cause you to have problems because of poor blood flow (circulation) to your feet and legs, which can cause your skin to: Become thinner and drier. Break more easily. Heal more slowly. Peel and crack. You may also have nerve damage (neuropathy) in your legs and feet, causing decreased feeling in them. This means that you may not notice minor injuries to your feet that could lead to more serious problems. Noticing and addressing any potential problems early is the best way to prevent future foot problems. How to care for your feet Foot hygiene  Wash your feet daily with warm water and mild soap. Do not use hot water. Then, pat your feet and the areas between your toes until they are completely dry. Do not soak your feet as this can dry your skin. Trim your toenails straight across. Do not dig under them or around the cuticle. File the edges of your nails with an emery board or nail file. Apply a moisturizing lotion or petroleum jelly to the skin on your feet and to dry, brittle toenails. Use lotion that does not contain alcohol and is unscented. Do not apply lotion between your toes. Shoes and socks Wear clean socks or stockings every day. Make sure they are not too tight. Do not wear knee-high stockings since they may decrease blood flow to your legs. Wear shoes that fit properly and have enough cushioning. Always look in your shoes before you put them on to be sure there are no objects inside. To break in new shoes, wear them for just a few hours a day. This prevents injuries on your feet. Wounds, scrapes, corns, and calluses  Check your feet daily for blisters, cuts, bruises, sores, and redness. If you cannot see the bottom of your feet, use a mirror or ask someone for help. Do not cut corns or calluses or try to remove them with medicine. If you find a minor scrape,  cut, or break in the skin on your feet, keep it and the skin around it clean and dry. You may clean these areas with mild soap and water. Do not clean the area with peroxide, alcohol, or iodine. If you have a wound, scrape, corn, or callus on your foot, look at it several times a day to make sure it is healing and not infected. Check for: Redness, swelling, or pain. Fluid or blood. Warmth. Pus or a bad smell. General tips Do not cross your legs. This may decrease blood flow to your feet. Do not use heating pads or hot water bottles on your feet. They may burn your skin. If you have lost feeling in your feet or legs, you may not know this is happening until it is too late. Protect your feet from hot and cold by wearing shoes, such as at the beach or on hot pavement. Schedule a complete foot exam at least once a year (annually) or more often if you have foot problems. Report any cuts, sores, or bruises to your health care provider immediately. Where to find more information American Diabetes Association: www.diabetes.org Association of Diabetes Care & Education Specialists: www.diabeteseducator.org Contact a health care provider if: You have a medical condition that increases your risk of infection and you have any cuts, sores, or bruises on your feet. You have an injury that is not healing. You have redness on your legs or feet. You   feel burning or tingling in your legs or feet. You have pain or cramps in your legs and feet. Your legs or feet are numb. Your feet always feel cold. You have pain around any toenails. Get help right away if: You have a wound, scrape, corn, or callus on your foot and: You have pain, swelling, or redness that gets worse. You have fluid or blood coming from the wound, scrape, corn, or callus. Your wound, scrape, corn, or callus feels warm to the touch. You have pus or a bad smell coming from the wound, scrape, corn, or callus. You have a fever. You have a red  line going up your leg. Summary Check your feet every day for blisters, cuts, bruises, sores, and redness. Apply a moisturizing lotion or petroleum jelly to the skin on your feet and to dry, brittle toenails. Wear shoes that fit properly and have enough cushioning. If you have foot problems, report any cuts, sores, or bruises to your health care provider immediately. Schedule a complete foot exam at least once a year (annually) or more often if you have foot problems. This information is not intended to replace advice given to you by your health care provider. Make sure you discuss any questions you have with your health care provider. Document Revised: 04/19/2020 Document Reviewed: 04/19/2020 Elsevier Patient Education  2023 [employer]  

## 2022-02-19 ENCOUNTER — Encounter: Payer: Self-pay | Admitting: Nurse Practitioner

## 2022-02-19 ENCOUNTER — Ambulatory Visit (INDEPENDENT_AMBULATORY_CARE_PROVIDER_SITE_OTHER): Payer: Commercial Managed Care - PPO | Admitting: Nurse Practitioner

## 2022-02-19 VITALS — BP 133/87 | HR 80 | Ht 61.5 in | Wt 231.0 lb

## 2022-02-19 DIAGNOSIS — I10 Essential (primary) hypertension: Secondary | ICD-10-CM

## 2022-02-19 DIAGNOSIS — E1165 Type 2 diabetes mellitus with hyperglycemia: Secondary | ICD-10-CM

## 2022-02-19 DIAGNOSIS — E782 Mixed hyperlipidemia: Secondary | ICD-10-CM

## 2022-02-19 MED ORDER — METFORMIN HCL 500 MG PO TABS: 500.0000 mg | ORAL_TABLET | Freq: Two times a day (BID) | ORAL | 3 refills | Status: DC

## 2022-02-19 NOTE — Patient Instructions (Signed)
Diabetes Mellitus and Foot Care Foot care is an important part of your health, especially when you have diabetes. Diabetes may cause you to have problems because of poor blood flow (circulation) to your feet and legs, which can cause your skin to: Become thinner and drier. Break more easily. Heal more slowly. Peel and crack. You may also have nerve damage (neuropathy) in your legs and feet, causing decreased feeling in them. This means that you may not notice minor injuries to your feet that could lead to more serious problems. Noticing and addressing any potential problems early is the best way to prevent future foot problems. How to care for your feet Foot hygiene  Wash your feet daily with warm water and mild soap. Do not use hot water. Then, pat your feet and the areas between your toes until they are completely dry. Do not soak your feet as this can dry your skin. Trim your toenails straight across. Do not dig under them or around the cuticle. File the edges of your nails with an emery board or nail file. Apply a moisturizing lotion or petroleum jelly to the skin on your feet and to dry, brittle toenails. Use lotion that does not contain alcohol and is unscented. Do not apply lotion between your toes. Shoes and socks Wear clean socks or stockings every day. Make sure they are not too tight. Do not wear knee-high stockings since they may decrease blood flow to your legs. Wear shoes that fit properly and have enough cushioning. Always look in your shoes before you put them on to be sure there are no objects inside. To break in new shoes, wear them for just a few hours a day. This prevents injuries on your feet. Wounds, scrapes, corns, and calluses  Check your feet daily for blisters, cuts, bruises, sores, and redness. If you cannot see the bottom of your feet, use a mirror or ask someone for help. Do not cut corns or calluses or try to remove them with medicine. If you find a minor scrape,  cut, or break in the skin on your feet, keep it and the skin around it clean and dry. You may clean these areas with mild soap and water. Do not clean the area with peroxide, alcohol, or iodine. If you have a wound, scrape, corn, or callus on your foot, look at it several times a day to make sure it is healing and not infected. Check for: Redness, swelling, or pain. Fluid or blood. Warmth. Pus or a bad smell. General tips Do not cross your legs. This may decrease blood flow to your feet. Do not use heating pads or hot water bottles on your feet. They may burn your skin. If you have lost feeling in your feet or legs, you may not know this is happening until it is too late. Protect your feet from hot and cold by wearing shoes, such as at the beach or on hot pavement. Schedule a complete foot exam at least once a year (annually) or more often if you have foot problems. Report any cuts, sores, or bruises to your health care provider immediately. Where to find more information American Diabetes Association: www.diabetes.org Association of Diabetes Care & Education Specialists: www.diabeteseducator.org Contact a health care provider if: You have a medical condition that increases your risk of infection and you have any cuts, sores, or bruises on your feet. You have an injury that is not healing. You have redness on your legs or feet. You   feel burning or tingling in your legs or feet. You have pain or cramps in your legs and feet. Your legs or feet are numb. Your feet always feel cold. You have pain around any toenails. Get help right away if: You have a wound, scrape, corn, or callus on your foot and: You have pain, swelling, or redness that gets worse. You have fluid or blood coming from the wound, scrape, corn, or callus. Your wound, scrape, corn, or callus feels warm to the touch. You have pus or a bad smell coming from the wound, scrape, corn, or callus. You have a fever. You have a red  line going up your leg. Summary Check your feet every day for blisters, cuts, bruises, sores, and redness. Apply a moisturizing lotion or petroleum jelly to the skin on your feet and to dry, brittle toenails. Wear shoes that fit properly and have enough cushioning. If you have foot problems, report any cuts, sores, or bruises to your health care provider immediately. Schedule a complete foot exam at least once a year (annually) or more often if you have foot problems. This information is not intended to replace advice given to you by your health care provider. Make sure you discuss any questions you have with your health care provider. Document Revised: 04/19/2020 Document Reviewed: 04/19/2020 Elsevier Patient Education  2023 [employer] Inc.  

## 2022-02-19 NOTE — Progress Notes (Signed)
? ?                                                    Endocrinology Follow Up Note  ?     02/19/2022, 11:21 AM ? ? ?Subjective:  ? ? Patient ID: Renee Harris, female    DOB: 1961/06/22.  ?Renee Harris is being seen in follow up after being seen in consultation for management of currently uncontrolled symptomatic diabetes requested by  Sharilyn Sites, MD. ? ? ?Past Medical History:  ?Diagnosis Date  ? Asthma   ? Blood transfusion   ? as child  ? Breast cancer ([place]) 2018  ? Cancer [facility])   ? Diabetes ([place])   ? type 2   ? GERD (gastroesophageal reflux disease)   ? Headache   ? Heart murmur   ? d/t aortic stenosis   ? History of kidney stones   ? Hyperlipidemia   ? Hypertension   ? Normal echocardiogram   ? Personal history of chemotherapy 2018  ? Personal history of radiation therapy 2018  ? Sleep apnea   ? ? ?Past Surgical History:  ?Procedure Laterality Date  ? ABDOMINAL HYSTERECTOMY  2004  ? BREAST LUMPECTOMY Right 2018  ? BREAST LUMPECTOMY WITH RADIOACTIVE SEED AND SENTINEL LYMPH NODE BIOPSY Right 12/09/2017  ? Procedure: RIGHT BREAST LUMPECTOMY WITH RADIOACTIVE SEED AND SENTINEL LYMPH NODE BIOPSY AND RADIOACTIVE SEED TARGETED RIGHT AXILLARY LYMPH NODE EXCISION;  Surgeon: Alphonsa Overall, MD;  Location: [facility];  Service: General;  Laterality: Right;  ? CHOLECYSTECTOMY  05/30/2011  ? Procedure: LAPAROSCOPIC CHOLECYSTECTOMY;  Surgeon: Donato Heinz;  Location: [facility];  Service: General;  Laterality: N/A;  ? EYE SURGERY    ? 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, on both eyes  ? KIDNEY STONE SURGERY    ? 2017 2-23  ? PORT-A-CATH REMOVAL Left 05/10/2020  ? Procedure: REMOVAL OF POWER PORT;  Surgeon: Alphonsa Overall, MD;  Location: [facility];  Service: General;  Laterality: Left;  ? PORTACATH PLACEMENT N/A 05/25/2017  ? Procedure: INSERTION PORT-A-CATH;  Surgeon: Alphonsa Overall, MD;  Location: [facility];  Service: General;  Laterality: N/A;  ? ? ?Social History  ? ?Socioeconomic History  ? Marital  status: Married  ?  Spouse name: Not on file  ? Number of children: Not on file  ? Years of education: Not on file  ? Highest education level: Not on file  ?Occupational History  ? Not on file  ?Tobacco Use  ? Smoking status: Never  ? Smokeless tobacco: Never  ?Vaping Use  ? Vaping Use: Never used  ?Substance and Sexual Activity  ? Alcohol use: Not Currently  ? Drug use: No  ? Sexual activity: Yes  ?  Birth control/protection: Surgical  ?Other Topics Concern  ? Not on file  ?Social History Narrative  ? Not on file  ? ?Social Determinants of Health  ? ?Financial Resource Strain: Not on file  ?Food Insecurity: Not on file  ?Transportation Needs: Not on file  ?Physical Activity: Not on file  ?Stress: Not on file  ?Social Connections: Not on file  ? ? ?Family History  ?Problem Relation Age of Onset  ? Hypertension Mother   ? Diabetes Mother   ? Heart attack Mother   ? Hypertension Father   ? Diabetes Father   ?  Lung cancer Maternal Grandmother   ? Anesthesia problems Neg Hx   ? Hypotension Neg Hx   ? Malignant hyperthermia Neg Hx   ? Pseudochol deficiency Neg Hx   ? ? ?Outpatient Encounter Medications as of 02/19/2022  ?Medication Sig  ? metFORMIN (GLUCOPHAGE) 500 MG tablet Take 1 tablet (500 mg total) by mouth 2 (two) times daily with a meal.  ? albuterol (PROVENTIL HFA;VENTOLIN HFA) 108 (90 BASE) MCG/ACT inhaler Inhale 1 puff into the lungs every 6 (six) hours as needed for wheezing or shortness of breath.  ? anastrozole (ARIMIDEX) 1 MG tablet TAKE (1) TABLET BY MOUTH ONCE DAILY.  ? celecoxib (CELEBREX) 200 MG capsule Take 200 mg by mouth daily as needed.  ? cholecalciferol (VITAMIN D3) 25 MCG (1000 UT) tablet Take 2,000 Units by mouth daily.  ? cyclobenzaprine (FLEXERIL) 10 MG tablet Take 10 mg by mouth every 8 (eight) hours as needed.  ? escitalopram (LEXAPRO) 20 MG tablet Take 10 mg by mouth at bedtime.   ? fenofibrate 160 MG tablet Take 160 mg by mouth at bedtime.   ? glucose blood (ONETOUCH VERIO) test strip Use  as instructed to monitor glucose 4 times daily  ? icosapent Ethyl (VASCEPA) 1 g capsule Take 2 g by mouth 2 (two) times daily.  ? insulin degludec (TRESIBA) 100 UNIT/ML FlexTouch Pen Inject 25 Units into the skin at bedtime.  ? Insulin Pen Needle (B-D ULTRAFINE III SHORT PEN) 31G X 8 MM MISC 1 each by Does not apply route as directed.  ? losartan (COZAAR) 50 MG tablet Take 25 mg by mouth at bedtime.  ? meclizine (ANTIVERT) 25 MG tablet Take 1 tablet (25 mg total) by mouth 3 (three) times daily as needed for dizziness.  ? Omega-3 Fatty Acids (FISH OIL) 1200 MG CAPS Take 2,400 mg by mouth daily.  ? omeprazole (PRILOSEC) 40 MG capsule Take 1 capsule (40 mg total) by mouth daily.  ? Skin Protectants, Misc. (EUCERIN) cream Apply 1 application. topically as needed for dry skin.  ? [DISCONTINUED] sitaGLIPtin-metformin (JANUMET) 50-500 MG tablet Take 1 tablet by mouth 2 (two) times daily with a meal.  ? ?No facility-administered encounter medications on file as of 02/19/2022.  ? ? ?ALLERGIES: ?Allergies  ?Allergen Reactions  ? Mestinon [Pyridostigmine]   ? Latex Itching  ? Pyridostigmine Bromide Other (See Comments)  ?  Makes muscles twitch  ? Statins Nausea Only  ? ? ?VACCINATION STATUS: ?Immunization History  ?Administered Date(s) Administered  ? Influenza,inj,Quad PF,6+ Mos 08/17/2017, 08/19/2018  ? ? ?Diabetes ?She presents for her follow-up diabetic visit. She has type 2 diabetes mellitus. Onset time: Diagnosed at approx age of 7. Her disease course has been improving. Hypoglycemia symptoms include dizziness. Associated symptoms include fatigue. Pertinent negatives for diabetes include no polydipsia, no polyuria and no weight loss. There are no hypoglycemic complications. Symptoms are improving. Diabetic complications include nephropathy. Risk factors for coronary artery disease include diabetes mellitus, dyslipidemia, family history, obesity, hypertension and sedentary lifestyle. Current diabetic treatment includes  oral agent (dual therapy) and insulin injections. She is compliant with treatment most of the time. Her weight is fluctuating minimally. She is following a generally healthy diet. When asked about meal planning, she reported none. She has not had a previous visit with a dietitian. She rarely participates in exercise. Her home blood glucose trend is decreasing steadily. Her breakfast blood glucose range is generally 140-180 mg/dl. Her bedtime blood glucose range is generally 180-200 mg/dl. (She presents today with her  logs showing greatly improved glycemic profile since starting basal insulin.  She was not due for another A1c today.  She did experience some dizziness before meal times occasionally but did not check her glucose at that time.  She is very concerned with cost of medications, had been getting Janumet samples from PCP office.  She says the [place], and pen needles cost nearly $300 because she has not yet met her deductible.  She is looking to buy at other pharmacies that may offer better price.) An ACE inhibitor/angiotensin II receptor blocker is being taken. She does not see a podiatrist.Eye exam is current.  ?Hyperlipidemia ?This is a chronic problem. The current episode started more than 1 year ago. The problem is uncontrolled. Recent lipid tests were reviewed and are variable. Exacerbating diseases include chronic renal disease, diabetes and obesity. Factors aggravating her hyperlipidemia include fatty foods. Current antihyperlipidemic treatment includes fibric acid derivatives. The current treatment provides mild improvement of lipids. Compliance problems include medication side effects, adherence to diet and adherence to exercise.  Risk factors for coronary artery disease include diabetes mellitus, dyslipidemia, family history, obesity, hypertension and a sedentary lifestyle.  ?Hypertension ?This is a chronic problem. The current episode started more than 1 year ago. The problem has been resolved  since onset. The problem is controlled. There are no associated agents to hypertension. Risk factors for coronary artery disease include diabetes mellitus, dyslipidemia, family history, obesity and [place]enta

## 2022-03-17 ENCOUNTER — Other Ambulatory Visit: Payer: Self-pay | Admitting: Oncology

## 2022-04-03 ENCOUNTER — Other Ambulatory Visit (HOSPITAL_COMMUNITY): Payer: Self-pay | Admitting: *Deleted

## 2022-04-03 ENCOUNTER — Other Ambulatory Visit (HOSPITAL_COMMUNITY): Payer: Self-pay | Admitting: Hematology

## 2022-04-03 DIAGNOSIS — C50211 Malignant neoplasm of upper-inner quadrant of right female breast: Secondary | ICD-10-CM

## 2022-04-03 NOTE — Telephone Encounter (Signed)
Refill for Arimidex approved.  Per last ovn, patient tolerating and is to continue therapy.

## 2022-05-27 ENCOUNTER — Ambulatory Visit: Payer: Commercial Managed Care - PPO | Admitting: Nurse Practitioner

## 2022-05-27 DIAGNOSIS — I10 Essential (primary) hypertension: Secondary | ICD-10-CM

## 2022-05-27 DIAGNOSIS — E782 Mixed hyperlipidemia: Secondary | ICD-10-CM

## 2022-05-27 DIAGNOSIS — E1165 Type 2 diabetes mellitus with hyperglycemia: Secondary | ICD-10-CM

## 2022-06-03 ENCOUNTER — Ambulatory Visit (INDEPENDENT_AMBULATORY_CARE_PROVIDER_SITE_OTHER): Payer: Commercial Managed Care - PPO | Admitting: Nurse Practitioner

## 2022-06-03 ENCOUNTER — Encounter: Payer: Self-pay | Admitting: Nurse Practitioner

## 2022-06-03 VITALS — BP 115/78 | HR 86 | Ht 61.5 in | Wt 233.0 lb

## 2022-06-03 DIAGNOSIS — E782 Mixed hyperlipidemia: Secondary | ICD-10-CM | POA: Diagnosis not present

## 2022-06-03 DIAGNOSIS — E1165 Type 2 diabetes mellitus with hyperglycemia: Secondary | ICD-10-CM | POA: Diagnosis not present

## 2022-06-03 DIAGNOSIS — I10 Essential (primary) hypertension: Secondary | ICD-10-CM | POA: Diagnosis not present

## 2022-06-03 LAB — POCT GLYCOSYLATED HEMOGLOBIN (HGB A1C): Hemoglobin A1C: 7.1 % — AB (ref 4.0–5.6)

## 2022-06-03 MED ORDER — INSULIN DEGLUDEC 100 UNIT/ML ~~LOC~~ SOPN: 30.0000 [IU] | PEN_INJECTOR | Freq: Every day | SUBCUTANEOUS | 3 refills | Status: DC

## 2022-06-03 NOTE — Progress Notes (Signed)
Endocrinology Follow Up Note       06/03/2022, 11:49 AM   Subjective:    Patient ID: Renee Harris, female    DOB: Apr 20, 1961.  Renee Harris is being seen in follow up after being seen in consultation for management of currently uncontrolled symptomatic diabetes requested by  Sharilyn Sites, MD.   Past Medical History:  Diagnosis Date   Asthma    Blood transfusion    as child   Breast cancer ([place]) 2018   Cancer ([place])    Diabetes ([place])    type 2    GERD (gastroesophageal reflux disease)    Headache    Heart murmur    d/t aortic stenosis    History of kidney stones    Hyperlipidemia    Hypertension    Normal echocardiogram    Personal history of chemotherapy 2018   Personal history of radiation therapy 2018   Sleep apnea     Past Surgical History:  Procedure Laterality Date   ABDOMINAL HYSTERECTOMY  2004   BREAST LUMPECTOMY Right 2018   BREAST LUMPECTOMY WITH RADIOACTIVE SEED AND SENTINEL LYMPH NODE BIOPSY Right 12/09/2017   Procedure: RIGHT BREAST LUMPECTOMY WITH RADIOACTIVE SEED AND SENTINEL LYMPH NODE BIOPSY AND RADIOACTIVE SEED TARGETED RIGHT AXILLARY LYMPH NODE EXCISION;  Surgeon: Alphonsa Overall, MD;  Location: [facility];  Service: General;  Laterality: Right;   CHOLECYSTECTOMY  05/30/2011   Procedure: LAPAROSCOPIC CHOLECYSTECTOMY;  Surgeon: Donato Heinz;  Location: [facility];  Service: General;  Laterality: N/A;   EYE SURGERY     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, on both eyes   KIDNEY STONE SURGERY     2017 2-23   PORT-A-CATH REMOVAL Left 05/10/2020   Procedure: REMOVAL OF POWER PORT;  Surgeon: Alphonsa Overall, MD;  Location: [facility];  Service: General;  Laterality: Left;   PORTACATH PLACEMENT N/A 05/25/2017   Procedure: INSERTION PORT-A-CATH;  Surgeon: Alphonsa Overall, MD;  Location: [facility];  Service: General;  Laterality: N/A;    Social History   Socioeconomic History   Marital  status: Married    Spouse name: Not on file   Number of children: Not on file   Years of education: Not on file   Highest education level: Not on file  Occupational History   Not on file  Tobacco Use   Smoking status: Never   Smokeless tobacco: Never  Vaping Use   Vaping Use: Never used  Substance and Sexual Activity   Alcohol use: Not Currently   Drug use: No   Sexual activity: Yes    Birth control/protection: Surgical  Other Topics Concern   Not on file  Social History Narrative   Not on file   Social Determinants of Health   Financial Resource Strain: Not on file  Food Insecurity: Not on file  Transportation Needs: Not on file  Physical Activity: Not on file  Stress: Not on file  Social Connections: Not on file    Family History  Problem Relation Age of Onset   Hypertension Mother    Diabetes Mother    Heart attack Mother    Hypertension Father    Diabetes Father  Lung cancer Maternal Grandmother    Anesthesia problems Neg Hx    Hypotension Neg Hx    Malignant hyperthermia Neg Hx    Pseudochol deficiency Neg Hx     Outpatient Encounter Medications as of 06/03/2022  Medication Sig   albuterol (PROVENTIL HFA;VENTOLIN HFA) 108 (90 BASE) MCG/ACT inhaler Inhale 1 puff into the lungs every 6 (six) hours as needed for wheezing or shortness of breath.   anastrozole (ARIMIDEX) 1 MG tablet TAKE (1) TABLET BY MOUTH ONCE DAILY.   celecoxib (CELEBREX) 200 MG capsule Take 200 mg by mouth daily as needed.   cholecalciferol (VITAMIN D3) 25 MCG (1000 UT) tablet Take 2,000 Units by mouth daily.   cyclobenzaprine (FLEXERIL) 10 MG tablet Take 10 mg by mouth every 8 (eight) hours as needed.   escitalopram (LEXAPRO) 20 MG tablet Take 10 mg by mouth at bedtime.    fenofibrate 160 MG tablet Take 160 mg by mouth at bedtime.    glucose blood (ONETOUCH VERIO) test strip Use as instructed to monitor glucose 4 times daily   insulin degludec (TRESIBA) 100 UNIT/ML FlexTouch Pen Inject 30  Units into the skin at bedtime.   Insulin Pen Needle (B-D ULTRAFINE III SHORT PEN) 31G X 8 MM MISC 1 each by Does not apply route as directed.   losartan (COZAAR) 50 MG tablet Take 25 mg by mouth at bedtime.   meclizine (ANTIVERT) 25 MG tablet Take 1 tablet (25 mg total) by mouth 3 (three) times daily as needed for dizziness.   metFORMIN (GLUCOPHAGE) 500 MG tablet Take 1 tablet (500 mg total) by mouth 2 (two) times daily with a meal.   Omega-3 Fatty Acids (FISH OIL) 1200 MG CAPS Take 2,400 mg by mouth daily.   omeprazole (PRILOSEC) 40 MG capsule Take 1 capsule (40 mg total) by mouth daily.   Skin Protectants, Misc. (EUCERIN) cream Apply 1 application. topically as needed for dry skin.   [DISCONTINUED] icosapent Ethyl (VASCEPA) 1 g capsule Take 2 g by mouth 2 (two) times daily.   [DISCONTINUED] insulin degludec (TRESIBA) 100 UNIT/ML FlexTouch Pen Inject 25 Units into the skin at bedtime.   No facility-administered encounter medications on file as of 06/03/2022.    ALLERGIES: Allergies  Allergen Reactions   Mestinon [Pyridostigmine]    Latex Itching   Pyridostigmine Bromide Other (See Comments)    Makes muscles twitch   Statins Nausea Only    VACCINATION STATUS: Immunization History  Administered Date(s) Administered   Influenza,inj,Quad PF,6+ Mos 08/17/2017, 08/19/2018    Diabetes She presents for her follow-up diabetic visit. She has type 2 diabetes mellitus. Onset time: Diagnosed at approx age of 75. Her disease course has been improving. Hypoglycemia symptoms include dizziness. Associated symptoms include fatigue. Pertinent negatives for diabetes include no polydipsia, no polyuria and no weight loss. There are no hypoglycemic complications. Symptoms are improving. Diabetic complications include nephropathy. Risk factors for coronary artery disease include diabetes mellitus, dyslipidemia, family history, obesity, hypertension and sedentary lifestyle. Current diabetic treatment includes  oral agent (dual therapy) and insulin injections. She is compliant with treatment most of the time. Her weight is fluctuating minimally. She is following a generally healthy diet. When asked about meal planning, she reported none. She has not had a previous visit with a dietitian. She rarely participates in exercise. Her home blood glucose trend is decreasing steadily. Her breakfast blood glucose range is generally 180-200 mg/dl. Her bedtime blood glucose range is generally 140-180 mg/dl. (She presents today with her logs  above target glycemic profile overall, however slightly improved.  Her POCT A1c today is 7.1%, improving from last visit of 9%.  She denies any hypoglycemia.  She admits to eating a snack sometimes after she checks her night time reading, which may be contributing to her higher morning numbers.) An ACE inhibitor/angiotensin II receptor blocker is being taken. She does not see a podiatrist.Eye exam is current.  Hyperlipidemia This is a chronic problem. The current episode started more than 1 year ago. The problem is uncontrolled. Recent lipid tests were reviewed and are variable. Exacerbating diseases include chronic renal disease, diabetes and obesity. Factors aggravating her hyperlipidemia include fatty foods. Current antihyperlipidemic treatment includes fibric acid derivatives. The current treatment provides mild improvement of lipids. Compliance problems include medication side effects, adherence to diet and adherence to exercise.  Risk factors for coronary artery disease include diabetes mellitus, dyslipidemia, family history, obesity, hypertension and a sedentary lifestyle.  Hypertension This is a chronic problem. The current episode started more than 1 year ago. The problem has been resolved since onset. The problem is controlled. There are no associated agents to hypertension. Risk factors for coronary artery disease include diabetes mellitus, dyslipidemia, family history, obesity and  sedentary lifestyle. Past treatments include angiotensin blockers. The current treatment provides moderate improvement. Compliance problems include diet and exercise.  Hypertensive end-organ damage includes kidney disease. Identifiable causes of hypertension include chronic renal disease.     Review of systems  Constitutional: + stable body weight, current Body mass index is 43.31 kg/m., no fatigue, no subjective hyperthermia, no subjective hypothermia Eyes: no blurry vision, no xerophthalmia ENT: no sore throat, no nodules palpated in throat, no dysphagia/odynophagia, no hoarseness Cardiovascular: no chest pain, no shortness of breath, no palpitations, no leg swelling Respiratory: no cough, no shortness of breath Gastrointestinal: no nausea/vomiting/diarrhea Musculoskeletal: no muscle/joint aches Skin: no rashes, no hyperemia Neurological: no tremors, no numbness, no tingling, + intermittent dizziness- right before meal Psychiatric: no depression, no anxiety  Objective:     BP 115/78   Pulse 86   Ht 5' 1.5" (1.562 m)   Wt 233 lb (105.7 kg)   BMI 43.31 kg/m   Wt Readings from Last 3 Encounters:  06/03/22 233 lb (105.7 kg)  02/19/22 231 lb (104.8 kg)  01/14/22 232 lb 12.8 oz (105.6 kg)     BP Readings from Last 3 Encounters:  06/03/22 115/78  02/19/22 133/87  01/14/22 132/84     Physical Exam- Limited  Constitutional:  Body mass index is 43.31 kg/m. , not in acute distress, normal state of mind Eyes:  EOMI, no exophthalmos Neck: Supple Cardiovascular: RRR, + murmur, rubs, or gallops, no edema Respiratory: Adequate breathing efforts, no crackles, rales, rhonchi, or wheezing Musculoskeletal: no gross deformities, strength intact in all four extremities, no gross restriction of joint movements Skin:  no rashes, no hyperemia Neurological: no tremor with outstretched hands   Diabetic Foot Exam - Simple   No data filed     CMP ( most recent) CMP     Component  Value Date/Time   NA 136 10/03/2020 1018   NA 142 04/22/2017 0831   K 4.2 10/03/2020 1018   K 4.1 04/22/2017 0831   CL 101 10/03/2020 1018   CO2 23 10/03/2020 1018   CO2 24 04/22/2017 0831   GLUCOSE 302 (H) 10/03/2020 1018   GLUCOSE 176 (H) 04/22/2017 0831   BUN 20 08/27/2021 0000   BUN 15.3 04/22/2017 0831   CREATININE 0.8 08/27/2021 0000  CREATININE 0.93 10/03/2020 1018   CREATININE 1.0 04/22/2017 0831   CALCIUM 10.7 08/27/2021 0000   CALCIUM 10.7 (H) 05/26/2018 1117   CALCIUM 10.7 (H) 04/22/2017 0831   PROT 7.8 10/03/2020 1018   PROT 7.6 04/22/2017 0831   ALBUMIN 4.1 10/03/2020 1018   ALBUMIN 4.0 04/22/2017 0831   AST 78 (A) 08/27/2021 0000   AST 29 04/22/2017 0831   ALT 70 (A) 08/27/2021 0000   ALT 35 04/22/2017 0831   ALKPHOS 109 10/03/2020 1018   ALKPHOS 68 04/22/2017 0831   BILITOT 0.7 10/03/2020 1018   BILITOT 0.51 04/22/2017 0831   GFRNONAA >60 10/03/2020 1018   GFRAA >60 02/21/2020 1322     Diabetic Labs (most recent): Lab Results  Component Value Date   HGBA1C 7.1 (A) 06/03/2022   HGBA1C 9 12/24/2021   HGBA1C 4.3 (L) 12/03/2017   MICROALBUR 34 08/27/2021     Lipid Panel ( most recent) Lipid Panel     Component Value Date/Time   CHOL 199 10/11/2018 1237   TRIG 681 (A) 08/27/2021 0000   HDL 23 (L) 10/11/2018 1237   CHOLHDL 8.7 10/11/2018 1237   VLDL 54 (H) 10/11/2018 1237   [place] 97 08/27/2021 0000      Lab Results  Component Value Date   TSH 2.11 08/27/2021   TSH 2.382 03/23/2018           Assessment & Plan:   1) Type 2 diabetes mellitus with hyperglycemia, without long-term current use of insulin ([place])  She presents today with her logs above target glycemic profile overall, however slightly improved.  Her POCT A1c today is 7.1%, improving from last visit of 9%.  She denies any hypoglycemia.  She admits to eating a snack sometimes after she checks her night time reading, which may be contributing to her higher morning numbers.  -  Renee Harris has currently uncontrolled symptomatic type 2 DM since 61 years of age, with most recent A1c of 9 %.   -Recent labs reviewed.  The following Lifestyle Medicine recommendations according to [facility]) were discussed and offered to patient and she agrees to start the journey:  A. Whole Foods, Plant-based plate comprising of fruits and vegetables, plant-based proteins, whole-grain carbohydrates was discussed in detail with the patient.   A list for source of those nutrients were also provided to the patient.  Patient will use only water or unsweetened tea for hydration. B.  The need to stay away from risky substances including alcohol, smoking; obtaining 7 to 9 hours of restorative sleep, at least 150 minutes of moderate intensity exercise weekly, the importance of healthy social connections,  and stress reduction techniques were discussed. C.  A full color page of  Calorie density of various food groups per pound showing examples of each food groups was provided to the patient.  - Nutritional counseling repeated at each appointment due to patients tendency to fall back in to old habits.  - The patient admits there is a room for improvement in their diet and drink choices. -  Suggestion is made for the patient to avoid simple carbohydrates from their diet including Cakes, Sweet Desserts / Pastries, Ice Cream, Soda (diet and regular), Sweet Tea, Candies, Chips, Cookies, Sweet Pastries, Store Bought Juices, Alcohol in Excess of 1-2 drinks a day, Artificial Sweeteners, Coffee Creamer, and "Sugar-free" Products. This will help patient to have stable blood glucose profile and potentially avoid unintended weight gain.   - I encouraged  the patient to switch to unprocessed or minimally processed complex starch and increased protein intake (animal or plant source), fruits, and vegetables.   - Patient is advised to stick to a routine mealtimes to eat 3 meals a  day and avoid unnecessary snacks (to snack only to correct hypoglycemia).  - she will be scheduled with Jearld Fenton, RDN, CDE for diabetes education.  - I have approached her with the following individualized plan to manage her diabetes and patient agrees:   -She will tolerate increase in her [place] Aas to 30 units SQ nightly (gave coupon to her today to help cover cost).  She can finish her current supply of Janumet 50/500 mg po twice daily with meals until she depletes her current supply (has about 3 months left) and then she will be given Metformin only 500 mg po twice daily with meals as Januvia is too expensive for her.    -she is encouraged to continue monitoring blood glucose twice daily, before breakfast and before bed, and to call the clinic if she has readings less than 70 or above 300 for 3 tests in a row.  - Adjustment parameters are given to her for hypo and hyperglycemia in writing.  - She does note she cannot tolerate higher doses of Metformin due to GI upset.  - she is not an ideal candidate for incretin therapy given her significantly elevated triglycerides increasing her risk of pancreatitis.  - Specific targets for  A1c; LDL, HDL, and Triglycerides were discussed with the patient.  2) Blood Pressure /Hypertension:  her blood pressure is controlled to target.   she is advised to continue her current medications including Losartan 50 mg p.o. daily with breakfast.  3) Lipids/Hyperlipidemia:    Review of her recent lipid panel from 08/27/21 showed controlled LDL at 97 and significantly elevated triglycerides of 681 .  She is intolerant to statins, is on Fenofibrate 160 mg po daily, Vascepa 2 g po BID, and Fish Oil supplement.   4)  Weight/Diet:  her Body mass index is 43.31 kg/m.  -  clearly complicating her diabetes care.   she is a candidate for weight loss. I discussed with her the fact that loss of 5 - 10% of her  current body weight will have the most impact on her  diabetes management.  Exercise, and detailed carbohydrates information provided  -  detailed on discharge instructions.  5) Chronic Care/Health Maintenance: -she is on ACEI/ARB and intolerant to Statin medications and is encouraged to initiate and continue to follow up with Ophthalmology, Dentist, Podiatrist at least yearly or according to recommendations, and advised to stay away from smoking. I have recommended yearly flu vaccine and pneumonia vaccine at least every 5 years; moderate intensity exercise for up to 150 minutes weekly; and sleep for at least 7 hours a day.  - she is advised to maintain close follow up with Sharilyn Sites, MD for primary care needs, as well as her other providers for optimal and coordinated care.     I spent 30 minutes in the care of the patient today including review of labs from [facility], Lipids, Thyroid Function, Hematology (current and previous including abstractions from other facilities); face-to-face time discussing  her blood glucose readings/logs, discussing hypoglycemia and hyperglycemia episodes and symptoms, medications doses, her options of short and long term treatment based on the latest standards of care / guidelines;  discussion about incorporating lifestyle medicine;  and documenting the encounter. Risk reduction counseling performed per USPSTF guidelines  to reduce  obesity and cardiovascular risk factors.     Please refer to Patient Instructions for Blood Glucose Monitoring and Insulin/Medications Dosing Guide"  in media tab for additional information. Please  also refer to " Patient Self Inventory" in the Media  tab for reviewed elements of pertinent patient history.  Jeanella Flattery participated in the discussions, expressed understanding, and voiced agreement with the above plans.  All questions were answered to her satisfaction. she is encouraged to contact clinic should she have any questions or concerns prior to her return visit.     Follow up  plan: - Return in about 3 months (around 09/03/2022) for Diabetes F/U with A1c in office, No previsit labs, Bring meter and logs.   Rayetta Pigg, [facility] [address] [place], [place] [address] Phone: [phone] Fax: [phone]  06/03/2022, 11:49 AM

## 2022-07-10 ENCOUNTER — Other Ambulatory Visit: Payer: Self-pay | Admitting: *Deleted

## 2022-07-10 ENCOUNTER — Other Ambulatory Visit (HOSPITAL_COMMUNITY): Payer: Self-pay | Admitting: Hematology

## 2022-07-10 DIAGNOSIS — Z170 Estrogen receptor positive status [ER+]: Secondary | ICD-10-CM

## 2022-07-10 NOTE — Telephone Encounter (Signed)
Anastrozole refill approved.  Patient tolerating and is to continue therapy. 

## 2022-08-21 ENCOUNTER — Encounter: Payer: Self-pay | Admitting: *Deleted

## 2022-09-01 ENCOUNTER — Other Ambulatory Visit: Payer: Self-pay | Admitting: "Endocrinology

## 2022-09-03 ENCOUNTER — Ambulatory Visit: Payer: Commercial Managed Care - PPO | Admitting: Nurse Practitioner

## 2022-09-17 ENCOUNTER — Encounter: Payer: Self-pay | Admitting: Nurse Practitioner

## 2022-09-17 ENCOUNTER — Ambulatory Visit (INDEPENDENT_AMBULATORY_CARE_PROVIDER_SITE_OTHER): Payer: Commercial Managed Care - PPO | Admitting: Nurse Practitioner

## 2022-09-17 VITALS — BP 135/84 | HR 88 | Ht 61.5 in | Wt 235.0 lb

## 2022-09-17 DIAGNOSIS — Z794 Long term (current) use of insulin: Secondary | ICD-10-CM

## 2022-09-17 DIAGNOSIS — I10 Essential (primary) hypertension: Secondary | ICD-10-CM | POA: Diagnosis not present

## 2022-09-17 DIAGNOSIS — Z7984 Long term (current) use of oral hypoglycemic drugs: Secondary | ICD-10-CM

## 2022-09-17 DIAGNOSIS — E1165 Type 2 diabetes mellitus with hyperglycemia: Secondary | ICD-10-CM

## 2022-09-17 DIAGNOSIS — E782 Mixed hyperlipidemia: Secondary | ICD-10-CM

## 2022-09-17 LAB — POCT GLYCOSYLATED HEMOGLOBIN (HGB A1C): Hemoglobin A1C: 7.5 % — AB (ref 4.0–5.6)

## 2022-09-17 MED ORDER — TRESIBA FLEXTOUCH 100 UNIT/ML ~~LOC~~ SOPN: 40.0000 [IU] | PEN_INJECTOR | Freq: Every day | SUBCUTANEOUS | 3 refills | Status: DC

## 2022-09-17 NOTE — Progress Notes (Signed)
Endocrinology Follow Up Note       09/17/2022, 11:55 AM   Subjective:    Patient ID: Renee Harris, female    DOB: 02/26/1961.  Renee Harris is being seen in follow up after being seen in consultation for management of currently uncontrolled symptomatic diabetes requested by  Sharilyn Sites, MD.   Past Medical History:  Diagnosis Date   Asthma    Blood transfusion    as child   Breast cancer ([place]) 2018   Cancer ([place])    Diabetes ([place])    type 2    GERD (gastroesophageal reflux disease)    Headache    Heart murmur    d/t aortic stenosis    History of kidney stones    Hyperlipidemia    Hypertension    Normal echocardiogram    Personal history of chemotherapy 2018   Personal history of radiation therapy 2018   Sleep apnea     Past Surgical History:  Procedure Laterality Date   ABDOMINAL HYSTERECTOMY  2004   BREAST LUMPECTOMY Right 2018   BREAST LUMPECTOMY WITH RADIOACTIVE SEED AND SENTINEL LYMPH NODE BIOPSY Right 12/09/2017   Procedure: RIGHT BREAST LUMPECTOMY WITH RADIOACTIVE SEED AND SENTINEL LYMPH NODE BIOPSY AND RADIOACTIVE SEED TARGETED RIGHT AXILLARY LYMPH NODE EXCISION;  Surgeon: Alphonsa Overall, MD;  Location: [facility];  Service: General;  Laterality: Right;   CHOLECYSTECTOMY  05/30/2011   Procedure: LAPAROSCOPIC CHOLECYSTECTOMY;  Surgeon: Donato Heinz;  Location: [facility];  Service: General;  Laterality: N/A;   EYE SURGERY     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, on both eyes   KIDNEY STONE SURGERY     2017 2-23   PORT-A-CATH REMOVAL Left 05/10/2020   Procedure: REMOVAL OF POWER PORT;  Surgeon: Alphonsa Overall, MD;  Location: [facility];  Service: General;  Laterality: Left;   PORTACATH PLACEMENT N/A 05/25/2017   Procedure: INSERTION PORT-A-CATH;  Surgeon: Alphonsa Overall, MD;  Location: [facility];  Service: General;  Laterality: N/A;    Social History   Socioeconomic History   Marital  status: Married    Spouse name: Not on file   Number of children: Not on file   Years of education: Not on file   Highest education level: Not on file  Occupational History   Not on file  Tobacco Use   Smoking status: Never   Smokeless tobacco: Never  Vaping Use   Vaping Use: Never used  Substance and Sexual Activity   Alcohol use: Not Currently   Drug use: No   Sexual activity: Yes    Birth control/protection: Surgical  Other Topics Concern   Not on file  Social History Narrative   Not on file   Social Determinants of Health   Financial Resource Strain: Not on file  Food Insecurity: Not on file  Transportation Needs: Not on file  Physical Activity: Not on file  Stress: Not on file  Social Connections: Not on file    Family History  Problem Relation Age of Onset   Hypertension Mother    Diabetes Mother    Heart attack Mother    Hypertension Father    Diabetes Father  Lung cancer Maternal Grandmother    Anesthesia problems Neg Hx    Hypotension Neg Hx    Malignant hyperthermia Neg Hx    Pseudochol deficiency Neg Hx     Outpatient Encounter Medications as of 09/17/2022  Medication Sig   albuterol (PROVENTIL HFA;VENTOLIN HFA) 108 (90 BASE) MCG/ACT inhaler Inhale 1 puff into the lungs every 6 (six) hours as needed for wheezing or shortness of breath.   anastrozole (ARIMIDEX) 1 MG tablet TAKE (1) TABLET BY MOUTH ONCE DAILY.   celecoxib (CELEBREX) 200 MG capsule Take 200 mg by mouth daily as needed.   cholecalciferol (VITAMIN D3) 25 MCG (1000 UT) tablet Take 2,000 Units by mouth daily.   cyclobenzaprine (FLEXERIL) 10 MG tablet Take 10 mg by mouth every 8 (eight) hours as needed.   escitalopram (LEXAPRO) 20 MG tablet Take 10 mg by mouth at bedtime.    fenofibrate 160 MG tablet Take 160 mg by mouth at bedtime.    glucose blood (ONETOUCH VERIO) test strip Use as instructed to monitor glucose 4 times daily   Insulin Pen Needle (B-D ULTRAFINE III SHORT PEN) 31G X 8 MM  MISC 1 each by Does not apply route as directed.   losartan (COZAAR) 50 MG tablet Take 25 mg by mouth at bedtime.   meclizine (ANTIVERT) 25 MG tablet Take 1 tablet (25 mg total) by mouth 3 (three) times daily as needed for dizziness.   metFORMIN (GLUCOPHAGE) 500 MG tablet Take 1 tablet (500 mg total) by mouth 2 (two) times daily with a meal.   Omega-3 Fatty Acids (FISH OIL) 1200 MG CAPS Take 2,400 mg by mouth daily.   omeprazole (PRILOSEC) 40 MG capsule Take 1 capsule (40 mg total) by mouth daily.   Skin Protectants, Misc. (EUCERIN) cream Apply 1 application. topically as needed for dry skin.   [DISCONTINUED] insulin degludec (TRESIBA FLEXTOUCH) 100 UNIT/ML FlexTouch Pen Inject 30 Units into the skin at bedtime.   insulin degludec (TRESIBA FLEXTOUCH) 100 UNIT/ML FlexTouch Pen Inject 40 Units into the skin at bedtime.   No facility-administered encounter medications on file as of 09/17/2022.    ALLERGIES: Allergies  Allergen Reactions   Mestinon [Pyridostigmine]    Latex Itching   Pyridostigmine Bromide Other (See Comments)    Makes muscles twitch   Statins Nausea Only    VACCINATION STATUS: Immunization History  Administered Date(s) Administered   Influenza,inj,Quad PF,6+ Mos 08/17/2017, 08/19/2018    Diabetes She presents for her follow-up diabetic visit. She has type 2 diabetes mellitus. Onset time: Diagnosed at approx age of 36. Her disease course has been stable. Hypoglycemia symptoms include dizziness. Associated symptoms include fatigue. Pertinent negatives for diabetes include no polydipsia, no polyuria and no weight loss. There are no hypoglycemic complications. Symptoms are improving. Diabetic complications include nephropathy. Risk factors for coronary artery disease include diabetes mellitus, dyslipidemia, family history, obesity, hypertension and sedentary lifestyle. Current diabetic treatment includes oral agent (dual therapy) and insulin injections. She is compliant with  treatment most of the time. Her weight is fluctuating minimally. She is following a generally healthy diet. When asked about meal planning, she reported none. She has not had a previous visit with a dietitian. She rarely participates in exercise. Her home blood glucose trend is fluctuating minimally. Her breakfast blood glucose range is generally 180-200 mg/dl. Her bedtime blood glucose range is generally 180-200 mg/dl. (She presents today with her logs, no meter, showing slightly above target glycemic profile.  Her POCT A1c today is 7.2%, essentially  unchanged from previous visit.  She says she tends to eat her feelings and has been more stressed lately (snacks at night).  She denies any hypoglycemia.) An ACE inhibitor/angiotensin II receptor blocker is being taken. She does not see a podiatrist.Eye exam is current.  Hyperlipidemia This is a chronic problem. The current episode started more than 1 year ago. The problem is uncontrolled. Recent lipid tests were reviewed and are variable. Exacerbating diseases include chronic renal disease, diabetes and obesity. Factors aggravating her hyperlipidemia include fatty foods. Current antihyperlipidemic treatment includes fibric acid derivatives. The current treatment provides mild improvement of lipids. Compliance problems include medication side effects, adherence to diet and adherence to exercise.  Risk factors for coronary artery disease include diabetes mellitus, dyslipidemia, family history, obesity, hypertension and a sedentary lifestyle.  Hypertension This is a chronic problem. The current episode started more than 1 year ago. The problem has been resolved since onset. The problem is controlled. There are no associated agents to hypertension. Risk factors for coronary artery disease include diabetes mellitus, dyslipidemia, family history, obesity and sedentary lifestyle. Past treatments include angiotensin blockers. The current treatment provides moderate  improvement. Compliance problems include diet and exercise.  Hypertensive end-organ damage includes kidney disease. Identifiable causes of hypertension include chronic renal disease.     Review of systems  Constitutional: + stable body weight, current Body mass index is 43.68 kg/m., no fatigue, no subjective hyperthermia, no subjective hypothermia Eyes: no blurry vision, no xerophthalmia ENT: no sore throat, no nodules palpated in throat, no dysphagia/odynophagia, no hoarseness Cardiovascular: no chest pain, no shortness of breath, no palpitations, no leg swelling Respiratory: no cough, no shortness of breath Gastrointestinal: no nausea/vomiting/diarrhea Musculoskeletal: no muscle/joint aches Skin: no rashes, no hyperemia Neurological: no tremors, no numbness, no tingling, Psychiatric: no depression, no anxiety  Objective:     BP 135/84 (BP Location: Left Arm, Patient Position: Sitting, Cuff Size: Large)   Pulse 88   Ht 5' 1.5" (1.562 m)   Wt 235 lb (106.6 kg)   BMI 43.68 kg/m   Wt Readings from Last 3 Encounters:  09/17/22 235 lb (106.6 kg)  06/03/22 233 lb (105.7 kg)  02/19/22 231 lb (104.8 kg)     BP Readings from Last 3 Encounters:  09/17/22 135/84  06/03/22 115/78  02/19/22 133/87     Physical Exam- Limited  Constitutional:  Body mass index is 43.68 kg/m. , not in acute distress, normal state of mind Eyes:  EOMI, no exophthalmos Neck: Supple Cardiovascular: RRR, + murmur, rubs, or gallops, no edema Respiratory: Adequate breathing efforts, no crackles, rales, rhonchi, or wheezing Musculoskeletal: no gross deformities, strength intact in all four extremities, no gross restriction of joint movements Skin:  no rashes, no hyperemia Neurological: no tremor with outstretched hands   Diabetic Foot Exam - Simple   No data filed     CMP ( most recent) CMP     Component Value Date/Time   NA 136 10/03/2020 1018   NA 142 04/22/2017 0831   K 4.2 10/03/2020 1018    K 4.1 04/22/2017 0831   CL 101 10/03/2020 1018   CO2 23 10/03/2020 1018   CO2 24 04/22/2017 0831   GLUCOSE 302 (H) 10/03/2020 1018   GLUCOSE 176 (H) 04/22/2017 0831   BUN 20 08/27/2021 0000   BUN 15.3 04/22/2017 0831   CREATININE 0.8 08/27/2021 0000   CREATININE 0.93 10/03/2020 1018   CREATININE 1.0 04/22/2017 0831   CALCIUM 10.7 08/27/2021 0000   CALCIUM 10.7 (  H) 05/26/2018 1117   CALCIUM 10.7 (H) 04/22/2017 0831   PROT 7.8 10/03/2020 1018   PROT 7.6 04/22/2017 0831   ALBUMIN 4.1 10/03/2020 1018   ALBUMIN 4.0 04/22/2017 0831   AST 78 (A) 08/27/2021 0000   AST 29 04/22/2017 0831   ALT 70 (A) 08/27/2021 0000   ALT 35 04/22/2017 0831   ALKPHOS 109 10/03/2020 1018   ALKPHOS 68 04/22/2017 0831   BILITOT 0.7 10/03/2020 1018   BILITOT 0.51 04/22/2017 0831   GFRNONAA >60 10/03/2020 1018   GFRAA >60 02/21/2020 1322     Diabetic Labs (most recent): Lab Results  Component Value Date   HGBA1C 7.5 (A) 09/17/2022   HGBA1C 7.1 (A) 06/03/2022   HGBA1C 9 12/24/2021   MICROALBUR 34 08/27/2021     Lipid Panel ( most recent) Lipid Panel     Component Value Date/Time   CHOL 199 10/11/2018 1237   TRIG 681 (A) 08/27/2021 0000   HDL 23 (L) 10/11/2018 1237   CHOLHDL 8.7 10/11/2018 1237   VLDL 54 (H) 10/11/2018 1237   [place] 97 08/27/2021 0000      Lab Results  Component Value Date   TSH 2.11 08/27/2021   TSH 2.382 03/23/2018           Assessment & Plan:   1) Type 2 diabetes mellitus with hyperglycemia, without long-term current use of insulin ([place])  She presents today with her logs, no meter, showing slightly above target glycemic profile.  Her POCT A1c today is 7.2%, essentially unchanged from previous visit.  She says she tends to eat her feelings and has been more stressed lately (snacks at night).  She denies any hypoglycemia.  - Renee Harris has currently uncontrolled symptomatic type 2 DM since 61 years of age.   -Recent labs reviewed.  The following  Lifestyle Medicine recommendations according to [facility]) were discussed and offered to patient and she agrees to start the journey:  A. Whole Foods, Plant-based plate comprising of fruits and vegetables, plant-based proteins, whole-grain carbohydrates was discussed in detail with the patient.   A list for source of those nutrients were also provided to the patient.  Patient will use only water or unsweetened tea for hydration. B.  The need to stay away from risky substances including alcohol, smoking; obtaining 7 to 9 hours of restorative sleep, at least 150 minutes of moderate intensity exercise weekly, the importance of healthy social connections,  and stress reduction techniques were discussed. C.  A full color page of  Calorie density of various food groups per pound showing examples of each food groups was provided to the patient.  - Nutritional counseling repeated at each appointment due to patients tendency to fall back in to old habits.  - The patient admits there is a room for improvement in their diet and drink choices. -  Suggestion is made for the patient to avoid simple carbohydrates from their diet including Cakes, Sweet Desserts / Pastries, Ice Cream, Soda (diet and regular), Sweet Tea, Candies, Chips, Cookies, Sweet Pastries, Store Bought Juices, Alcohol in Excess of 1-2 drinks a day, Artificial Sweeteners, Coffee Creamer, and "Sugar-free" Products. This will help patient to have stable blood glucose profile and potentially avoid unintended weight gain.   - I encouraged the patient to switch to unprocessed or minimally processed complex starch and increased protein intake (animal or plant source), fruits, and vegetables.   - Patient is advised to stick to a routine mealtimes  to eat 3 meals a day and avoid unnecessary snacks (to snack only to correct hypoglycemia).  - she will be scheduled with Jearld Fenton, RDN, CDE for diabetes education.  - I  have approached her with the following individualized plan to manage her diabetes and patient agrees:   -She will tolerate increase in her [place] Aas to 40 units SQ nightly.  She can finish her current supply of Janumet 50/500 mg po twice daily with meals until she depletes her current supply (has about 2 weeks left) and then she will be given Metformin only 500 mg po twice daily with meals as Januvia is too expensive for her.    -she is encouraged to continue monitoring blood glucose twice daily, before breakfast and before bed, and to call the clinic if she has readings less than 70 or above 300 for 3 tests in a row.  - Adjustment parameters are given to her for hypo and hyperglycemia in writing.  - She does note she cannot tolerate higher doses of Metformin due to GI upset.  - she is not an ideal candidate for incretin therapy given her significantly elevated triglycerides increasing her risk of pancreatitis.  - Specific targets for  A1c; LDL, HDL, and Triglycerides were discussed with the patient.  2) Blood Pressure /Hypertension:  her blood pressure is controlled to target.   she is advised to continue her current medications including Losartan 50 mg p.o. daily with breakfast.  3) Lipids/Hyperlipidemia:    Review of her recent lipid panel from 08/27/21 showed controlled LDL at 97 and significantly elevated triglycerides of 681 .  She is intolerant to statins, is on Fenofibrate 160 mg po daily, Vascepa 2 g po BID, and Fish Oil supplement.   4)  Weight/Diet:  her Body mass index is 43.68 kg/m.  -  clearly complicating her diabetes care.   she is a candidate for weight loss. I discussed with her the fact that loss of 5 - 10% of her  current body weight will have the most impact on her diabetes management.  Exercise, and detailed carbohydrates information provided  -  detailed on discharge instructions.  5) Hypercalcemia She has history of kidney stones, osteopenia.  Recent calcium levels were  elevated at 11.2.  I will do more labs Calcium and PTH levels, PTH-rp, Phosphorous, Magnesium, and vitamin D to investigate further and rule out potential parathyroid problem.  6) Chronic Care/Health Maintenance: -she is on ACEI/ARB and intolerant to Statin medications and is encouraged to initiate and continue to follow up with Ophthalmology, Dentist, Podiatrist at least yearly or according to recommendations, and advised to stay away from smoking. I have recommended yearly flu vaccine and pneumonia vaccine at least every 5 years; moderate intensity exercise for up to 150 minutes weekly; and sleep for at least 7 hours a day.  - she is advised to maintain close follow up with Sharilyn Sites, MD for primary care needs, as well as her other providers for optimal and coordinated care.     I spent 33 minutes in the care of the patient today including review of labs from [place], Lipids, Thyroid Function, Hematology (current and previous including abstractions from other facilities); face-to-face time discussing  her blood glucose readings/logs, discussing hypoglycemia and hyperglycemia episodes and symptoms, medications doses, her options of short and long term treatment based on the latest standards of care / guidelines;  discussion about incorporating lifestyle medicine;  and documenting the encounter. Risk reduction counseling performed per USPSTF guidelines  to reduce obesity and cardiovascular risk factors.     Please refer to Patient Instructions for Blood Glucose Monitoring and Insulin/Medications Dosing Guide"  in media tab for additional information. Please  also refer to " Patient Self Inventory" in the Media  tab for reviewed elements of pertinent patient history.  Jeanella Flattery participated in the discussions, expressed understanding, and voiced agreement with the above plans.  All questions were answered to her satisfaction. she is encouraged to contact clinic should she have any questions or  concerns prior to her return visit.     Follow up plan: - Return in about 3 months (around 12/17/2022) for Diabetes F/U with A1c in office, Bring meter and logs, Previsit labs.   Rayetta Pigg, [facility] [address] [place],  [address] Phone: [phone] Fax: [phone]  09/17/2022, 11:55 AM

## 2022-09-29 ENCOUNTER — Other Ambulatory Visit: Payer: Self-pay | Admitting: Nurse Practitioner

## 2022-10-07 ENCOUNTER — Other Ambulatory Visit: Payer: Self-pay | Admitting: Nurse Practitioner

## 2022-10-07 ENCOUNTER — Other Ambulatory Visit (HOSPITAL_COMMUNITY): Payer: Self-pay | Admitting: Hematology

## 2022-10-07 DIAGNOSIS — Z170 Estrogen receptor positive status [ER+]: Secondary | ICD-10-CM

## 2022-10-08 ENCOUNTER — Other Ambulatory Visit: Payer: Self-pay | Admitting: *Deleted

## 2022-10-08 DIAGNOSIS — C50211 Malignant neoplasm of upper-inner quadrant of right female breast: Secondary | ICD-10-CM

## 2022-10-08 NOTE — Progress Notes (Signed)
Received refill request for anastrozole.  Patient has not been seen since 10/11/20 and missed follow up.  1 refill given and patient will be seen tomorrow with labs.

## 2022-10-08 NOTE — Telephone Encounter (Signed)
Anastrozole refill approved.  Patient is tolerating and is to continue therapy.  

## 2022-10-09 ENCOUNTER — Inpatient Hospital Stay: Payer: Commercial Managed Care - PPO

## 2022-10-09 ENCOUNTER — Other Ambulatory Visit (HOSPITAL_COMMUNITY)
Admission: RE | Admit: 2022-10-09 | Discharge: 2022-10-09 | Disposition: A | Discharge status: Home / Self Care | Payer: Commercial Managed Care - PPO | Source: Ambulatory Visit | Attending: Nurse Practitioner | Admitting: Nurse Practitioner

## 2022-10-09 ENCOUNTER — Other Ambulatory Visit: Payer: Self-pay | Admitting: Hematology

## 2022-10-09 ENCOUNTER — Inpatient Hospital Stay: Payer: Commercial Managed Care - PPO | Attending: Hematology | Admitting: Hematology

## 2022-10-09 VITALS — BP 128/76 | HR 76 | Temp 98.6°F | Resp 16 | Wt 237.0 lb

## 2022-10-09 DIAGNOSIS — Z79811 Long term (current) use of aromatase inhibitors: Secondary | ICD-10-CM | POA: Insufficient documentation

## 2022-10-09 DIAGNOSIS — Z170 Estrogen receptor positive status [ER+]: Secondary | ICD-10-CM | POA: Insufficient documentation

## 2022-10-09 DIAGNOSIS — C50211 Malignant neoplasm of upper-inner quadrant of right female breast: Secondary | ICD-10-CM | POA: Insufficient documentation

## 2022-10-09 DIAGNOSIS — R7989 Other specified abnormal findings of blood chemistry: Secondary | ICD-10-CM | POA: Insufficient documentation

## 2022-10-09 DIAGNOSIS — D696 Thrombocytopenia, unspecified: Secondary | ICD-10-CM | POA: Diagnosis not present

## 2022-10-09 DIAGNOSIS — M8580 Other specified disorders of bone density and structure, unspecified site: Secondary | ICD-10-CM | POA: Diagnosis not present

## 2022-10-09 DIAGNOSIS — I10 Essential (primary) hypertension: Secondary | ICD-10-CM | POA: Diagnosis not present

## 2022-10-09 DIAGNOSIS — E119 Type 2 diabetes mellitus without complications: Secondary | ICD-10-CM | POA: Diagnosis not present

## 2022-10-09 DIAGNOSIS — G629 Polyneuropathy, unspecified: Secondary | ICD-10-CM | POA: Insufficient documentation

## 2022-10-09 DIAGNOSIS — Z8041 Family history of malignant neoplasm of ovary: Secondary | ICD-10-CM | POA: Insufficient documentation

## 2022-10-09 DIAGNOSIS — E559 Vitamin D deficiency, unspecified: Secondary | ICD-10-CM | POA: Diagnosis not present

## 2022-10-09 DIAGNOSIS — Z801 Family history of malignant neoplasm of trachea, bronchus and lung: Secondary | ICD-10-CM | POA: Insufficient documentation

## 2022-10-09 DIAGNOSIS — Z90710 Acquired absence of both cervix and uterus: Secondary | ICD-10-CM | POA: Diagnosis not present

## 2022-10-09 LAB — FOLATE: Folate: 24.2 ng/mL (ref >5.9)

## 2022-10-09 LAB — COMPREHENSIVE METABOLIC PANEL
ALT: 49 U/L — ABNORMAL HIGH (ref 0–44)
AST: 49 U/L — ABNORMAL HIGH (ref 15–41)
Albumin: 4.1 g/dL (ref 3.5–5.0)
Alkaline Phosphatase: 63 U/L (ref 38–126)
Anion gap: 8 (ref 5–15)
BUN: 17 mg/dL (ref 6–20)
CO2: 26 mmol/L (ref 22–32)
Calcium: 10.3 mg/dL (ref 8.9–10.3)
Chloride: 103 mmol/L (ref 98–111)
Creatinine, Ser: 0.81 mg/dL (ref 0.44–1.00)
GFR, Estimated: >60 mL/min (ref >60)
Glucose, Bld: 191 mg/dL — ABNORMAL HIGH (ref 70–99)
Potassium: 4.1 mmol/L (ref 3.5–5.1)
Sodium: 137 mmol/L (ref 135–145)
Total Bilirubin: 0.5 mg/dL (ref 0.3–1.2)
Total Protein: 7.7 g/dL (ref 6.5–8.1)

## 2022-10-09 LAB — CBC WITH DIFFERENTIAL/PLATELET
Abs Immature Granulocytes: 0.04 10*3/uL (ref 0.00–0.07)
Basophils Absolute: 0 10*3/uL (ref 0.0–0.1)
Basophils Relative: 1 %
Eosinophils Absolute: 0.2 10*3/uL (ref 0.0–0.5)
Eosinophils Relative: 4 %
HCT: 40.7 % (ref 36.0–46.0)
Hemoglobin: 13.6 g/dL (ref 12.0–15.0)
Immature Granulocytes: 1 %
Lymphocytes Relative: 25 %
Lymphs Abs: 1.3 10*3/uL (ref 0.7–4.0)
MCH: 28 pg (ref 26.0–34.0)
MCHC: 33.4 g/dL (ref 30.0–36.0)
MCV: 83.7 fL (ref 80.0–100.0)
Monocytes Absolute: 0.3 10*3/uL (ref 0.1–1.0)
Monocytes Relative: 6 %
Neutro Abs: 3.4 10*3/uL (ref 1.7–7.7)
Neutrophils Relative %: 63 %
Platelets: 138 10*3/uL — ABNORMAL LOW (ref 150–400)
RBC: 4.86 MIL/uL (ref 3.87–5.11)
RDW: 14.1 % (ref 11.5–15.5)
WBC: 5.3 10*3/uL (ref 4.0–10.5)
nRBC: 0 % (ref 0.0–0.2)

## 2022-10-09 LAB — VITAMIN D 25 HYDROXY (VIT D DEFICIENCY, FRACTURES): Vit D, 25-Hydroxy: 38.16 ng/mL (ref 30–100)

## 2022-10-09 LAB — VITAMIN B12: Vitamin B-12: 616 pg/mL (ref 180–914)

## 2022-10-09 LAB — PHOSPHORUS: Phosphorus: 2.2 mg/dL — ABNORMAL LOW (ref 2.5–4.6)

## 2022-10-09 LAB — LACTATE DEHYDROGENASE: LDH: 104 U/L (ref 98–192)

## 2022-10-09 LAB — MAGNESIUM: Magnesium: 1.7 mg/dL (ref 1.7–2.4)

## 2022-10-09 NOTE — Patient Instructions (Addendum)
[place] [facility] Discharge Instructions   You were seen and examined today by Dr. Delton Coombes.  He reviewed the results of your lab work which are normal/stable.   We will see you back in 6 months. We will repeat lab work and mammogram prior to your next visit.    Thank you for choosing [place] at [facility] to provide your oncology and hematology care.  To afford each patient quality time with our provider, please arrive at least 15 minutes before your scheduled appointment time.   If you have a lab appointment with the [facility] please come in thru the Main Entrance and check in at the main information desk.  You need to re-schedule your appointment should you arrive 10 or more minutes late.  We strive to give you quality time with our providers, and arriving late affects you and other patients whose appointments are after yours.  Also, if you no show three or more times for appointments you may be dismissed from the clinic at the providers discretion.     Again, thank you for choosing [facility].  Our hope is that these requests will decrease the amount of time that you wait before being seen by our physicians.       _____________________________________________________________  Should you have questions after your visit to [facility], please contact our office at [phone] and follow the prompts.  Our office hours are 8:00 a.m. and 4:30 p.m. Monday - Friday.  Please note that voicemails left after 4:00 p.m. may not be returned until the following business day.  We are closed weekends and major holidays.  You do have access to a nurse 24-7, just call the main number to the clinic [phone] and do not press any options, hold on the line and a nurse will answer the phone.    For prescription refill requests, have your pharmacy contact our office and allow 72 hours.    Due to Covid, you will need  to wear a mask upon entering the hospital. If you do not have a mask, a mask will be given to you at the Main Entrance upon arrival. For doctor visits, patients may have 1 support person age 42 or older with them. For treatment visits, patients can not have anyone with them due to social distancing guidelines and our immunocompromised population.

## 2022-10-09 NOTE — Progress Notes (Signed)
[place], [place] [address]   CLINIC:  Medical Oncology/Hematology  PCP:  Sharilyn Sites, [place] [address] [phone]   REASON FOR VISIT:  Follow-up for right breast cancer  PRIOR THERAPY: 6 cycles of neoadjuvant TCHP followed by right lumpectomy and SLNB, followed by Kadcyla and anastrozole  NGS Results: Not applicable  CURRENT THERAPY: Anastrozole  BRIEF ONCOLOGIC HISTORY:  Oncology History  Malignant neoplasm of upper-inner quadrant of right breast in female, estrogen receptor positive ([place])  04/13/2017 Initial Diagnosis   Screening detected right breast mass with calcifications 2.1 cm in size and 3.4 cm in size. Calcifications were fibroadenoma. Right breast biopsy upper inner quadrant 1:00 mass: IDC with DCIS with necrosis, grade 3, ER 100%, PR 70%, Ki-67 60%, HER-2 positive ratio 2.34, T2 N1 stage IB (New AJCC)   04/25/2017 Breast MRI   Right breast upper inner quadrant 2.2 x 1.7 x 2.7 cm mass, abnormal area of clumped non-mass enhancement in the right lateral breast 3.2 x 1.9 x 1 cm, too abnormal lymph nodes right axilla 4.3 and 1.7 cm (biopsy-proven breast cancer)    05/08/2017 Procedure   Right axilla lymph node biopsy: Metastatic carcinoma   05/13/2017 Procedure   Right breast biopsy retroareolar region: Intraductal papilloma   01/20/2018 - 11/04/2018 Chemotherapy   Patient is on Treatment Plan : BREAST ADO-Trastuzumab Emtansine (Kadcyla) q21d       CANCER STAGING:  Cancer Staging  Malignant neoplasm of upper-inner quadrant of right breast in female, estrogen receptor positive ([place]) Staging form: Breast, AJCC 8th Edition - Clinical stage from 04/22/2017: Stage IB (cT2, cN0, cM0, G3, ER+, PR+, HER2+) - Unsigned    INTERVAL HISTORY:  Renee Harris 61 y.o. female seen for follow-up of right breast cancer.  She was lost to follow-up, was last seen in our clinic on 10/11/2020 due to insurance reasons.   Energy levels are 50%.  She is taking anastrozole without fail.  She has hot flashes occasionally but no new pains.    REVIEW OF SYSTEMS:  Review of Systems  Respiratory:  Positive for cough.   Gastrointestinal:  Positive for constipation and diarrhea.  Neurological:  Positive for headaches and numbness (Fingertips).  Psychiatric/Behavioral:  Positive for depression and sleep disturbance. The patient is nervous/anxious.   All other systems reviewed and are negative.    PAST MEDICAL/SURGICAL HISTORY:  Past Medical History:  Diagnosis Date   Asthma    Blood transfusion    as child   Breast cancer ([place]) 2018   Cancer ([place])    Diabetes ([place])    type 2    GERD (gastroesophageal reflux disease)    Headache    Heart murmur    d/t aortic stenosis    History of kidney stones    Hyperlipidemia    Hypertension    Normal echocardiogram    Personal history of chemotherapy 2018   Personal history of radiation therapy 2018   Sleep apnea    Past Surgical History:  Procedure Laterality Date   ABDOMINAL HYSTERECTOMY  2004   BREAST LUMPECTOMY Right 2018   BREAST LUMPECTOMY WITH RADIOACTIVE SEED AND SENTINEL LYMPH NODE BIOPSY Right 12/09/2017   Procedure: RIGHT BREAST LUMPECTOMY WITH RADIOACTIVE SEED AND SENTINEL LYMPH NODE BIOPSY AND RADIOACTIVE SEED TARGETED RIGHT AXILLARY LYMPH NODE EXCISION;  Surgeon: Alphonsa Overall, MD;  Location: [facility];  Service: General;  Laterality: Right;   CHOLECYSTECTOMY  05/30/2011   Procedure: LAPAROSCOPIC CHOLECYSTECTOMY;  Surgeon:  Donato Heinz;  Location: [facility];  Service: General;  Laterality: N/A;   EYE SURGERY     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, on both eyes   KIDNEY STONE SURGERY     2017 2-23   PORT-A-CATH REMOVAL Left 05/10/2020   Procedure: REMOVAL OF POWER PORT;  Surgeon: Alphonsa Overall, MD;  Location: [facility];  Service: General;  Laterality: Left;   PORTACATH PLACEMENT N/A 05/25/2017   Procedure: INSERTION PORT-A-CATH;  Surgeon:  Alphonsa Overall, MD;  Location: [facility];  Service: General;  Laterality: N/A;     SOCIAL HISTORY:  Social History   Socioeconomic History   Marital status: Married    Spouse name: Not on file   Number of children: Not on file   Years of education: Not on file   Highest education level: Not on file  Occupational History   Not on file  Tobacco Use   Smoking status: Never   Smokeless tobacco: Never  Vaping Use   Vaping Use: Never used  Substance and Sexual Activity   Alcohol use: Not Currently   Drug use: No   Sexual activity: Yes    Birth control/protection: Surgical  Other Topics Concern   Not on file  Social History Narrative   Not on file   Social Determinants of Health   Financial Resource Strain: Not on file  Food Insecurity: Not on file  Transportation Needs: Not on file  Physical Activity: Not on file  Stress: Not on file  Social Connections: Not on file  Intimate Partner Violence: Not At Risk (05/25/2018)   Humiliation, Afraid, Rape, and Kick questionnaire    Fear of Current or Ex-Partner: No    Emotionally Abused: No    Physically Abused: No    Sexually Abused: No    FAMILY HISTORY:  Family History  Problem Relation Age of Onset   Hypertension Mother    Diabetes Mother    Heart attack Mother    Hypertension Father    Diabetes Father    Lung cancer Maternal Grandmother    Anesthesia problems Neg Hx    Hypotension Neg Hx    Malignant hyperthermia Neg Hx    Pseudochol deficiency Neg Hx     CURRENT MEDICATIONS:  Outpatient Encounter Medications as of 10/09/2022  Medication Sig   albuterol (PROVENTIL HFA;VENTOLIN HFA) 108 (90 BASE) MCG/ACT inhaler Inhale 1 puff into the lungs every 6 (six) hours as needed for wheezing or shortness of breath.   anastrozole (ARIMIDEX) 1 MG tablet TAKE (1) TABLET BY MOUTH ONCE DAILY.   celecoxib (CELEBREX) 200 MG capsule Take 200 mg by mouth daily as needed.   cholecalciferol (VITAMIN D3) 25 MCG (1000 UT) tablet Take  2,000 Units by mouth daily.   cyclobenzaprine (FLEXERIL) 10 MG tablet Take 10 mg by mouth every 8 (eight) hours as needed.   escitalopram (LEXAPRO) 20 MG tablet Take 10 mg by mouth at bedtime.    fenofibrate 160 MG tablet Take 160 mg by mouth at bedtime.    glucose blood (ONETOUCH VERIO) test strip Use as instructed to monitor glucose 4 times daily   insulin degludec (TRESIBA FLEXTOUCH) 100 UNIT/ML FlexTouch Pen Inject 40 Units into the skin at bedtime.   losartan (COZAAR) 50 MG tablet Take 25 mg by mouth at bedtime.   meclizine (ANTIVERT) 25 MG tablet Take 1 tablet (25 mg total) by mouth 3 (three) times daily as needed for dizziness.   metFORMIN (GLUCOPHAGE) 500 MG tablet Take 1  tablet (500 mg total) by mouth 2 (two) times daily with a meal.   Omega-3 Fatty Acids (FISH OIL) 1200 MG CAPS Take 2,400 mg by mouth daily.   omeprazole (PRILOSEC) 40 MG capsule Take 1 capsule (40 mg total) by mouth daily.   Skin Protectants, Misc. (EUCERIN) cream Apply 1 application. topically as needed for dry skin.   SURE COMFORT PEN NEEDLES 31G X 8 MM MISC USING DAILY WITH TRESBIA.   [DISCONTINUED] Insulin Pen Needle (B-D ULTRAFINE III SHORT PEN) 31G X 8 MM MISC 1 each by Does not apply route as directed.   No facility-administered encounter medications on file as of 10/09/2022.    ALLERGIES:  Allergies  Allergen Reactions   Mestinon [Pyridostigmine]    Latex Itching   Pyridostigmine Bromide Other (See Comments)    Makes muscles twitch   Statins Nausea Only     PHYSICAL EXAM:  ECOG Performance status: 0  Vitals:   10/09/22 1001  BP: 128/76  Pulse: 76  Resp: 16  Temp: 98.6 F (37 C)  SpO2: 97%   Filed Weights   10/09/22 1001  Weight: 236 lb 15.9 oz (107.5 kg)   Physical Exam Vitals reviewed.  Constitutional:      Appearance: Normal appearance.  Cardiovascular:     Rate and Rhythm: Normal rate and regular rhythm.     Pulses: Normal pulses.     Heart sounds: Normal heart sounds.   Pulmonary:     Effort: Pulmonary effort is normal.     Breath sounds: Normal breath sounds.  Abdominal:     Palpations: Abdomen is soft. There is no mass.  Neurological:     Mental Status: She is alert.  Psychiatric:        Mood and Affect: Mood normal.        Behavior: Behavior normal.      LABORATORY DATA:  I have reviewed the labs as listed.  CBC    Component Value Date/Time   WBC 5.3 10/09/2022 0904   RBC 4.86 10/09/2022 0904   HGB 13.6 10/09/2022 0904   HGB 13.8 04/22/2017 0831   HCT 40.7 10/09/2022 0904   HCT 41.9 04/22/2017 0831   PLT 138 (L) 10/09/2022 0904   PLT 184 04/22/2017 0831   MCV 83.7 10/09/2022 0904   MCV 85.7 04/22/2017 0831   MCH 28.0 10/09/2022 0904   MCHC 33.4 10/09/2022 0904   RDW 14.1 10/09/2022 0904   RDW 13.9 04/22/2017 0831   LYMPHSABS 1.3 10/09/2022 0904   LYMPHSABS 2.1 04/22/2017 0831   MONOABS 0.3 10/09/2022 0904   MONOABS 0.5 04/22/2017 0831   EOSABS 0.2 10/09/2022 0904   EOSABS 0.3 04/22/2017 0831   BASOSABS 0.0 10/09/2022 0904   BASOSABS 0.0 04/22/2017 0831      Latest Ref Rng & Units 10/09/2022    9:04 AM 08/27/2021   12:00 AM 10/03/2020   10:18 AM  CMP  Glucose 70 - 99 mg/dL 191   302   BUN 6 - 20 mg/dL _0 Creatinine 0.44 - 1.00 mg/dL 0.81  0.8     0.93   Sodium 135 - 145 mmol/L 137   136   Potassium 3.5 - 5.1 mmol/L 4.1   4.2   Chloride 98 - 111 mmol/L 103   101   CO2 22 - 32 mmol/L 26   23   Calcium 8.9 - 10.3 mg/dL 10.3  10.7     11.1  Total Protein 6.5 - 8.1 g/dL 7.7   7.8   Total Bilirubin 0.3 - 1.2 mg/dL 0.5   0.7   Alkaline Phos 38 - 126 U/L 63   109   AST 15 - 41 U/L 49  78     77   ALT 0 - 44 U/L 49  70     69      This result is from an external source.    DIAGNOSTIC IMAGING:  I have independently reviewed the scans and discussed with the patient.  ASSESSMENT:  1.  Clinical T2N0 HER-2 positive breast cancer: -Biopsy of the right breast upper inner quadrant 1 o'clock position mass, IDC,  ER/PR positive, HER-2 positive, Ki 67 to 60%. -Neoadjuvant chemotherapy with 6 cycles of TCHP from 05/29/2017 through 10/09/2017. -Right lumpectomy and lymph node biopsy on 12/09/2017, with lumpectomy #1 specimen with no malignancy, lumpectomy #2 specimen showing 2 cm IDC, grade 3, 1 out of 6 lymph nodes positive for malignancy. -Based on [place] trial, started on Kadcyla 3.6 mcg/kg for 14 cycle started on 01/20/2018 through 11/04/2018. - Unable to tolerate letrozole. - Anastrozole started on 05/05/2018   2.  Social/family history: - She is a retired [employer] manager.  Non-smoker.  Exposure to passive smoking present. - Paternal aunt had ovarian cancer.  Father had prostate cancer.     PLAN:  1.  T2 N0 HER2 positive right breast cancer: - Physical examination today shows right lumpectomy scar has mild scar thickness.  No other palpable masses or lymphadenopathy. - Mammogram (01/29/2022): No evidence of malignancy. - Labs show elevated AST and ALT.  CBC was normal with mild thrombocytopenia. - Continue anastrozole. - We will schedule her mammogram next year and RTC 6 months for follow-up.  2.  Osteopenia: - DEXA scan (11/22/2020): With T-score -2.2.  T-score -1.5 on 08/25/2018. - Vitamin D levels were normal at 38. - Continue vitamin D 2000 units daily.  3.  Mild thrombocytopenia/elevated LFTs: - She has mild thrombocytopenia several years duration with no bleeding issues. - She has fatty liver on CT on 02/23/2018, likely causing elevated AST and ALT.  4.  Peripheral neuropathy: - She has occasional shooting pains in the legs.  She is no longer taking gabapentin.    Orders placed this encounter:  Orders Placed This Encounter  Procedures   MM DIAG BREAST TOMO BILATERAL   CBC with Differential   Comprehensive metabolic panel   VITAMIN D 25 Hydroxy (Vit-D Deficiency, Fractures)      Derek Jack, MD [employer] [phone]

## 2022-10-10 LAB — PTH, INTACT AND CALCIUM
Calcium, Total (PTH): 10.8 mg/dL — ABNORMAL HIGH (ref 8.7–10.2)
PTH: 39 pg/mL (ref 15–65)

## 2022-12-17 ENCOUNTER — Ambulatory Visit (INDEPENDENT_AMBULATORY_CARE_PROVIDER_SITE_OTHER): Payer: Commercial Managed Care - PPO | Admitting: Nurse Practitioner

## 2022-12-17 ENCOUNTER — Encounter: Payer: Self-pay | Admitting: Nurse Practitioner

## 2022-12-17 VITALS — BP 104/67 | HR 80 | Ht 61.5 in | Wt 233.0 lb

## 2022-12-17 DIAGNOSIS — E1165 Type 2 diabetes mellitus with hyperglycemia: Secondary | ICD-10-CM

## 2022-12-17 DIAGNOSIS — I10 Essential (primary) hypertension: Secondary | ICD-10-CM

## 2022-12-17 DIAGNOSIS — E782 Mixed hyperlipidemia: Secondary | ICD-10-CM | POA: Diagnosis not present

## 2022-12-17 LAB — POCT UA - MICROALBUMIN
Albumin/Creatinine Ratio, Urine, POC: >300
Creatinine, POC: 50 mg/dL
Microalbumin Ur, POC: 150 mg/L

## 2022-12-17 LAB — POCT GLYCOSYLATED HEMOGLOBIN (HGB A1C): HbA1c, POC (controlled diabetic range): 6.8 % (ref 0.0–7.0)

## 2022-12-17 MED ORDER — ONETOUCH VERIO VI STRP: ORAL_STRIP | 12 refills | Status: AC

## 2022-12-17 MED ORDER — TRESIBA FLEXTOUCH 100 UNIT/ML ~~LOC~~ SOPN: 45.0000 [IU] | PEN_INJECTOR | Freq: Every day | SUBCUTANEOUS | 3 refills | Status: DC

## 2022-12-17 NOTE — Progress Notes (Signed)
Endocrinology Follow Up Note       12/17/2022, 10:20 AM   Subjective:    Patient ID: Renee Harris, female    DOB: 06-25-1961.  Renee Harris is being seen in follow up after being seen in consultation for management of currently uncontrolled symptomatic diabetes requested by  Sharilyn Sites, MD.   Past Medical History:  Diagnosis Date   Asthma    Blood transfusion    as child   Breast cancer ([place]) 2018   Cancer ([place])    Diabetes ([place])    type 2    GERD (gastroesophageal reflux disease)    Headache    Heart murmur    d/t aortic stenosis    History of kidney stones    Hyperlipidemia    Hypertension    Normal echocardiogram    Personal history of chemotherapy 2018   Personal history of radiation therapy 2018   Sleep apnea     Past Surgical History:  Procedure Laterality Date   ABDOMINAL HYSTERECTOMY  2004   BREAST LUMPECTOMY Right 2018   BREAST LUMPECTOMY WITH RADIOACTIVE SEED AND SENTINEL LYMPH NODE BIOPSY Right 12/09/2017   Procedure: RIGHT BREAST LUMPECTOMY WITH RADIOACTIVE SEED AND SENTINEL LYMPH NODE BIOPSY AND RADIOACTIVE SEED TARGETED RIGHT AXILLARY LYMPH NODE EXCISION;  Surgeon: Alphonsa Overall, MD;  Location: [facility];  Service: General;  Laterality: Right;   CHOLECYSTECTOMY  05/30/2011   Procedure: LAPAROSCOPIC CHOLECYSTECTOMY;  Surgeon: Donato Heinz;  Location: [facility];  Service: General;  Laterality: N/A;   EYE SURGERY     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, on both eyes   KIDNEY STONE SURGERY     2017 2-23   PORT-A-CATH REMOVAL Left 05/10/2020   Procedure: REMOVAL OF POWER PORT;  Surgeon: Alphonsa Overall, MD;  Location: [facility];  Service: General;  Laterality: Left;   PORTACATH PLACEMENT N/A 05/25/2017   Procedure: INSERTION PORT-A-CATH;  Surgeon: Alphonsa Overall, MD;  Location: [facility];  Service: General;  Laterality: N/A;    Social History   Socioeconomic History   Marital  status: Married    Spouse name: Not on file   Number of children: Not on file   Years of education: Not on file   Highest education level: Not on file  Occupational History   Not on file  Tobacco Use   Smoking status: Never   Smokeless tobacco: Never  Vaping Use   Vaping Use: Never used  Substance and Sexual Activity   Alcohol use: Not Currently   Drug use: No   Sexual activity: Yes    Birth control/protection: Surgical  Other Topics Concern   Not on file  Social History Narrative   Not on file   Social Determinants of Health   Financial Resource Strain: Not on file  Food Insecurity: Not on file  Transportation Needs: Not on file  Physical Activity: Not on file  Stress: Not on file  Social Connections: Not on file    Family History  Problem Relation Age of Onset   Hypertension Mother    Diabetes Mother    Heart attack Mother    Hypertension Father    Diabetes Father  Lung cancer Maternal Grandmother    Anesthesia problems Neg Hx    Hypotension Neg Hx    Malignant hyperthermia Neg Hx    Pseudochol deficiency Neg Hx     Outpatient Encounter Medications as of 12/17/2022  Medication Sig   albuterol (PROVENTIL HFA;VENTOLIN HFA) 108 (90 BASE) MCG/ACT inhaler Inhale 1 puff into the lungs every 6 (six) hours as needed for wheezing or shortness of breath.   anastrozole (ARIMIDEX) 1 MG tablet TAKE (1) TABLET BY MOUTH ONCE DAILY.   celecoxib (CELEBREX) 200 MG capsule Take 200 mg by mouth daily as needed.   cholecalciferol (VITAMIN D3) 25 MCG (1000 UT) tablet Take 2,000 Units by mouth daily.   cyclobenzaprine (FLEXERIL) 10 MG tablet Take 10 mg by mouth every 8 (eight) hours as needed.   escitalopram (LEXAPRO) 20 MG tablet Take 10 mg by mouth at bedtime.    fenofibrate 160 MG tablet Take 160 mg by mouth at bedtime.    glucose blood (ONETOUCH VERIO) test strip Use as instructed to monitor glucose 4 times daily   insulin degludec (TRESIBA FLEXTOUCH) 100 UNIT/ML FlexTouch Pen  Inject 45 Units into the skin at bedtime.   losartan (COZAAR) 50 MG tablet Take 25 mg by mouth at bedtime.   meclizine (ANTIVERT) 25 MG tablet Take 1 tablet (25 mg total) by mouth 3 (three) times daily as needed for dizziness.   metFORMIN (GLUCOPHAGE) 500 MG tablet Take 1 tablet (500 mg total) by mouth 2 (two) times daily with a meal.   Omega-3 Fatty Acids (FISH OIL) 1200 MG CAPS Take 2,400 mg by mouth daily.   omeprazole (PRILOSEC) 40 MG capsule Take 1 capsule (40 mg total) by mouth daily.   Skin Protectants, Misc. (EUCERIN) cream Apply 1 application. topically as needed for dry skin.   SURE COMFORT PEN NEEDLES 31G X 8 MM MISC USING DAILY WITH TRESBIA.   [DISCONTINUED] glucose blood (ONETOUCH VERIO) test strip Use as instructed to monitor glucose 4 times daily   [DISCONTINUED] insulin degludec (TRESIBA FLEXTOUCH) 100 UNIT/ML FlexTouch Pen Inject 40 Units into the skin at bedtime.   No facility-administered encounter medications on file as of 12/17/2022.    ALLERGIES: Allergies  Allergen Reactions   Mestinon [Pyridostigmine]    Latex Itching   Pyridostigmine Bromide Other (See Comments)    Makes muscles twitch   Statins Nausea Only    VACCINATION STATUS: Immunization History  Administered Date(s) Administered   Influenza Split 08/19/2016   Influenza,inj,Quad PF,6+ Mos 08/17/2017, 08/19/2018   Influenza-Unspecified 08/06/2022    Diabetes She presents for her follow-up diabetic visit. She has type 2 diabetes mellitus. Onset time: Diagnosed at approx age of 62. Her disease course has been improving. Hypoglycemia symptoms include dizziness. Associated symptoms include fatigue. Pertinent negatives for diabetes include no polydipsia, no polyuria and no weight loss. There are no hypoglycemic complications. Symptoms are improving. Diabetic complications include nephropathy. Risk factors for coronary artery disease include diabetes mellitus, dyslipidemia, family history, obesity, hypertension  and sedentary lifestyle. Current diabetic treatment includes insulin injections and oral agent (monotherapy). She is compliant with treatment most of the time. Her weight is fluctuating minimally. She is following a generally healthy diet. When asked about meal planning, she reported none. She has not had a previous visit with a dietitian. She rarely participates in exercise. Her home blood glucose trend is fluctuating minimally. Her breakfast blood glucose range is generally 140-180 mg/dl. Her bedtime blood glucose range is generally 180-200 mg/dl. (She presents today with her  logs, no meter, showing improving glycemic profile.  Her POCT A1c today is 6.8%, improving from last visit of 7.5%.  She notes she recently had a cold and the cough syrup made her glucose readings go up some during that time.  She is working hard to cut out snacking.  She denies any hypoglycemia.) An ACE inhibitor/angiotensin II receptor blocker is being taken. She does not see a podiatrist.Eye exam is current.  Hyperlipidemia This is a chronic problem. The current episode started more than 1 year ago. The problem is uncontrolled. Recent lipid tests were reviewed and are variable. Exacerbating diseases include chronic renal disease, diabetes and obesity. Factors aggravating her hyperlipidemia include fatty foods. Current antihyperlipidemic treatment includes fibric acid derivatives. The current treatment provides mild improvement of lipids. Compliance problems include medication side effects, adherence to diet and adherence to exercise.  Risk factors for coronary artery disease include diabetes mellitus, dyslipidemia, family history, obesity, hypertension and a sedentary lifestyle.  Hypertension This is a chronic problem. The current episode started more than 1 year ago. The problem has been resolved since onset. The problem is controlled. There are no associated agents to hypertension. Risk factors for coronary artery disease include  diabetes mellitus, dyslipidemia, family history, obesity and sedentary lifestyle. Past treatments include angiotensin blockers. The current treatment provides moderate improvement. Compliance problems include diet and exercise.  Hypertensive end-organ damage includes kidney disease. Identifiable causes of hypertension include chronic renal disease.     Review of systems  Constitutional: + stable body weight, current Body mass index is 43.31 kg/m., no fatigue, no subjective hyperthermia, no subjective hypothermia Eyes: no blurry vision, no xerophthalmia ENT: no sore throat, no nodules palpated in throat, no dysphagia/odynophagia, no hoarseness Cardiovascular: no chest pain, no shortness of breath, no palpitations, no leg swelling Respiratory: no cough, no shortness of breath Gastrointestinal: no nausea/vomiting/diarrhea Musculoskeletal: no muscle/joint aches Skin: no rashes, no hyperemia Neurological: no tremors, no numbness, no tingling, Psychiatric: no depression, no anxiety  Objective:     BP 104/67   Pulse 80   Ht 5' 1.5" (1.562 m)   Wt 233 lb (105.7 kg)   BMI 43.31 kg/m   Wt Readings from Last 3 Encounters:  12/17/22 233 lb (105.7 kg)  10/09/22 236 lb 15.9 oz (107.5 kg)  09/17/22 235 lb (106.6 kg)     BP Readings from Last 3 Encounters:  12/17/22 104/67  10/09/22 128/76  09/17/22 135/84     Physical Exam- Limited  Constitutional:  Body mass index is 43.31 kg/m. , not in acute distress, normal state of mind Eyes:  EOMI, no exophthalmos Musculoskeletal: no gross deformities, strength intact in all four extremities, no gross restriction of joint movements Skin:  no rashes, no hyperemia Neurological: no tremor with outstretched hands   Diabetic Foot Exam - Simple   No data filed     CMP ( most recent) CMP     Component Value Date/Time   NA 137 10/09/2022 0904   NA 142 04/22/2017 0831   K 4.1 10/09/2022 0904   K 4.1 04/22/2017 0831   CL 103 10/09/2022 0904    CO2 26 10/09/2022 0904   CO2 24 04/22/2017 0831   GLUCOSE 191 (H) 10/09/2022 0904   GLUCOSE 176 (H) 04/22/2017 0831   BUN 17 10/09/2022 0904   BUN 20 08/27/2021 0000   BUN 15.3 04/22/2017 0831   CREATININE 0.81 10/09/2022 0904   CREATININE 1.0 04/22/2017 0831   CALCIUM 10.8 (H) 10/09/2022 0906   CALCIUM  10.3 10/09/2022 0904   CALCIUM 10.7 (H) 04/22/2017 0831   PROT 7.7 10/09/2022 0904   PROT 7.6 04/22/2017 0831   ALBUMIN 4.1 10/09/2022 0904   ALBUMIN 4.0 04/22/2017 0831   AST 49 (H) 10/09/2022 0904   AST 29 04/22/2017 0831   ALT 49 (H) 10/09/2022 0904   ALT 35 04/22/2017 0831   ALKPHOS 63 10/09/2022 0904   ALKPHOS 68 04/22/2017 0831   BILITOT 0.5 10/09/2022 0904   BILITOT 0.51 04/22/2017 0831   GFRNONAA >60 10/09/2022 0904   GFRAA >60 02/21/2020 1322     Diabetic Labs (most recent): Lab Results  Component Value Date   HGBA1C 6.8 12/17/2022   HGBA1C 7.5 (A) 09/17/2022   HGBA1C 7.1 (A) 06/03/2022   MICROALBUR 150 12/17/2022   MICROALBUR 34 08/27/2021     Lipid Panel ( most recent) Lipid Panel     Component Value Date/Time   CHOL 199 10/11/2018 1237   TRIG 681 (A) 08/27/2021 0000   HDL 23 (L) 10/11/2018 1237   CHOLHDL 8.7 10/11/2018 1237   VLDL 54 (H) 10/11/2018 1237   [place] 97 08/27/2021 0000      Lab Results  Component Value Date   TSH 2.11 08/27/2021   TSH 2.382 03/23/2018           Assessment & Plan:   1) Type 2 diabetes mellitus with hyperglycemia, without long-term current use of insulin ([place])  She presents today with her logs, no meter, showing improving glycemic profile.  Her POCT A1c today is 6.8%, improving from last visit of 7.5%.  She notes she recently had a cold and the cough syrup made her glucose readings go up some during that time.  She is working hard to cut out snacking.  She denies any hypoglycemia.  - Renee Harris has currently uncontrolled symptomatic type 2 DM since 62 years of age.   -Recent labs reviewed.  POCT UM  shows mild microalbuminuria but recent kidney function was normal.  Will plan to recheck kidney function on subsequent visits.  The following Lifestyle Medicine recommendations according to [facility]) were discussed and offered to patient and she agrees to start the journey:  A. Whole Foods, Plant-based plate comprising of fruits and vegetables, plant-based proteins, whole-grain carbohydrates was discussed in detail with the patient.   A list for source of those nutrients were also provided to the patient.  Patient will use only water or unsweetened tea for hydration. B.  The need to stay away from risky substances including alcohol, smoking; obtaining 7 to 9 hours of restorative sleep, at least 150 minutes of moderate intensity exercise weekly, the importance of healthy social connections,  and stress reduction techniques were discussed. C.  A full color page of  Calorie density of various food groups per pound showing examples of each food groups was provided to the patient.  - Nutritional counseling repeated at each appointment due to patients tendency to fall back in to old habits.  - The patient admits there is a room for improvement in their diet and drink choices. -  Suggestion is made for the patient to avoid simple carbohydrates from their diet including Cakes, Sweet Desserts / Pastries, Ice Cream, Soda (diet and regular), Sweet Tea, Candies, Chips, Cookies, Sweet Pastries, Store Bought Juices, Alcohol in Excess of 1-2 drinks a day, Artificial Sweeteners, Coffee Creamer, and "Sugar-free" Products. This will help patient to have stable blood glucose profile and potentially avoid unintended weight gain.   -  I encouraged the patient to switch to unprocessed or minimally processed complex starch and increased protein intake (animal or plant source), fruits, and vegetables.   - Patient is advised to stick to a routine mealtimes to eat 3 meals a day and avoid  unnecessary snacks (to snack only to correct hypoglycemia).  - she will be scheduled with Jearld Fenton, RDN, CDE for diabetes education.  - I have approached her with the following individualized plan to manage her diabetes and patient agrees:   -She will tolerate increase in her Tresiba to 45 units SQ nightly.  She can continue her Metformin 500 mg po twice daily after meals.    -she is encouraged to continue monitoring blood glucose twice daily, before breakfast and before bed, and to call the clinic if she has readings less than 70 or above 300 for 3 tests in a row.  - Adjustment parameters are given to her for hypo and hyperglycemia in writing.  - She does note she cannot tolerate higher doses of Metformin due to GI upset.  - she is not an ideal candidate for incretin therapy given her significantly elevated triglycerides increasing her risk of pancreatitis.  - Specific targets for  A1c; LDL, HDL, and Triglycerides were discussed with the patient.  2) Blood Pressure /Hypertension:  her blood pressure is controlled to target.   she is advised to continue her current medications including Losartan 50 mg p.o. daily with breakfast.  3) Lipids/Hyperlipidemia:    Review of her recent lipid panel from 08/06/22 showed uncontrolled LDL at 153 and significantly elevated triglycerides of 349 (improving) .  She is intolerant to statins, is on Fenofibrate 160 mg po daily, Vascepa 2 g po BID, and Fish Oil supplement.   4)  Weight/Diet:  her Body mass index is 43.31 kg/m.  -  clearly complicating her diabetes care.   she is a candidate for weight loss. I discussed with her the fact that loss of 5 - 10% of her  current body weight will have the most impact on her diabetes management.  Exercise, and detailed carbohydrates information provided  -  detailed on discharge instructions.  5) Hypercalcemia She has history of kidney stones, osteopenia.  Recent calcium levels were elevated at 11.2.  Did  labs showing slightly elevated calcium but normal PTH.  Her phosphorous was slightly low as a result.  Recent vitamin d level on low end of normal.  I do not suspect primary hyperparathyroidism, think more related to supplements vs higher calcium diet.  6) Chronic Care/Health Maintenance: -she is on ACEI/ARB and intolerant to Statin medications and is encouraged to initiate and continue to follow up with Ophthalmology, Dentist, Podiatrist at least yearly or according to recommendations, and advised to stay away from smoking. I have recommended yearly flu vaccine and pneumonia vaccine at least every 5 years; moderate intensity exercise for up to 150 minutes weekly; and sleep for at least 7 hours a day.  - she is advised to maintain close follow up with Sharilyn Sites, MD for primary care needs, as well as her other providers for optimal and coordinated care.      I spent  40  minutes in the care of the patient today including review of labs from [facility], Lipids, Thyroid Function, Hematology (current and previous including abstractions from other facilities); face-to-face time discussing  her blood glucose readings/logs, discussing hypoglycemia and hyperglycemia episodes and symptoms, medications doses, her options of short and long term treatment based on the  latest standards of care / guidelines;  discussion about incorporating lifestyle medicine;  and documenting the encounter. Risk reduction counseling performed per USPSTF guidelines to reduce obesity and cardiovascular risk factors.     Please refer to Patient Instructions for Blood Glucose Monitoring and Insulin/Medications Dosing Guide"  in media tab for additional information. Please  also refer to " Patient Self Inventory" in the Media  tab for reviewed elements of pertinent patient history.  Jeanella Flattery participated in the discussions, expressed understanding, and voiced agreement with the above plans.  All questions were answered to her  satisfaction. she is encouraged to contact clinic should she have any questions or concerns prior to her return visit.     Follow up plan: - Return in about 4 months (around 04/18/2023) for Diabetes F/U with A1c in office, No previsit labs, Bring meter and logs.   Rayetta Pigg, [facility] [address] [place], [place] [address] Phone: [phone] Fax: [phone]  12/17/2022, 10:20 AM

## 2023-01-05 ENCOUNTER — Other Ambulatory Visit: Payer: Self-pay | Admitting: Hematology

## 2023-01-05 DIAGNOSIS — C50211 Malignant neoplasm of upper-inner quadrant of right female breast: Secondary | ICD-10-CM

## 2023-02-03 ENCOUNTER — Inpatient Hospital Stay (HOSPITAL_COMMUNITY): Admission: RE | Admit: 2023-02-03 | Payer: Commercial Managed Care - PPO | Source: Ambulatory Visit

## 2023-02-03 ENCOUNTER — Ambulatory Visit (HOSPITAL_COMMUNITY): Payer: Commercial Managed Care - PPO

## 2023-02-12 ENCOUNTER — Ambulatory Visit (HOSPITAL_COMMUNITY)
Admission: RE | Admit: 2023-02-12 | Discharge: 2023-02-12 | Disposition: A | Discharge status: Home / Self Care | Payer: Commercial Managed Care - PPO | Source: Ambulatory Visit | Attending: Hematology | Admitting: Hematology

## 2023-02-12 DIAGNOSIS — Z170 Estrogen receptor positive status [ER+]: Secondary | ICD-10-CM | POA: Diagnosis present

## 2023-02-12 DIAGNOSIS — C50211 Malignant neoplasm of upper-inner quadrant of right female breast: Secondary | ICD-10-CM

## 2023-02-17 ENCOUNTER — Encounter: Payer: Self-pay | Admitting: *Deleted

## 2023-03-13 ENCOUNTER — Other Ambulatory Visit: Payer: Self-pay | Admitting: Nurse Practitioner

## 2023-04-02 ENCOUNTER — Inpatient Hospital Stay: Payer: Commercial Managed Care - PPO | Attending: Hematology

## 2023-04-02 DIAGNOSIS — Z170 Estrogen receptor positive status [ER+]: Secondary | ICD-10-CM | POA: Diagnosis not present

## 2023-04-02 DIAGNOSIS — I10 Essential (primary) hypertension: Secondary | ICD-10-CM | POA: Insufficient documentation

## 2023-04-02 DIAGNOSIS — Z7984 Long term (current) use of oral hypoglycemic drugs: Secondary | ICD-10-CM | POA: Diagnosis not present

## 2023-04-02 DIAGNOSIS — K219 Gastro-esophageal reflux disease without esophagitis: Secondary | ICD-10-CM | POA: Diagnosis not present

## 2023-04-02 DIAGNOSIS — Z794 Long term (current) use of insulin: Secondary | ICD-10-CM | POA: Diagnosis not present

## 2023-04-02 DIAGNOSIS — J45909 Unspecified asthma, uncomplicated: Secondary | ICD-10-CM | POA: Diagnosis not present

## 2023-04-02 DIAGNOSIS — E785 Hyperlipidemia, unspecified: Secondary | ICD-10-CM | POA: Insufficient documentation

## 2023-04-02 DIAGNOSIS — Z79811 Long term (current) use of aromatase inhibitors: Secondary | ICD-10-CM | POA: Diagnosis not present

## 2023-04-02 DIAGNOSIS — C50211 Malignant neoplasm of upper-inner quadrant of right female breast: Secondary | ICD-10-CM | POA: Diagnosis present

## 2023-04-02 DIAGNOSIS — G4730 Sleep apnea, unspecified: Secondary | ICD-10-CM | POA: Insufficient documentation

## 2023-04-02 DIAGNOSIS — E119 Type 2 diabetes mellitus without complications: Secondary | ICD-10-CM | POA: Insufficient documentation

## 2023-04-02 DIAGNOSIS — Z8041 Family history of malignant neoplasm of ovary: Secondary | ICD-10-CM | POA: Insufficient documentation

## 2023-04-02 DIAGNOSIS — D696 Thrombocytopenia, unspecified: Secondary | ICD-10-CM | POA: Insufficient documentation

## 2023-04-02 DIAGNOSIS — Z79899 Other long term (current) drug therapy: Secondary | ICD-10-CM | POA: Diagnosis not present

## 2023-04-02 DIAGNOSIS — M8580 Other specified disorders of bone density and structure, unspecified site: Secondary | ICD-10-CM | POA: Diagnosis not present

## 2023-04-02 LAB — CBC WITH DIFFERENTIAL/PLATELET
Abs Immature Granulocytes: 0.02 10*3/uL (ref 0.00–0.07)
Basophils Absolute: 0 10*3/uL (ref 0.0–0.1)
Basophils Relative: 1 %
Eosinophils Absolute: 0.4 10*3/uL (ref 0.0–0.5)
Eosinophils Relative: 6 %
HCT: 43.6 % (ref 36.0–46.0)
Hemoglobin: 14.5 g/dL (ref 12.0–15.0)
Immature Granulocytes: 0 %
Lymphocytes Relative: 32 %
Lymphs Abs: 1.8 10*3/uL (ref 0.7–4.0)
MCH: 28.2 pg (ref 26.0–34.0)
MCHC: 33.3 g/dL (ref 30.0–36.0)
MCV: 84.8 fL (ref 80.0–100.0)
Monocytes Absolute: 0.4 10*3/uL (ref 0.1–1.0)
Monocytes Relative: 6 %
Neutro Abs: 3.1 10*3/uL (ref 1.7–7.7)
Neutrophils Relative %: 55 %
Platelets: 192 10*3/uL (ref 150–400)
RBC: 5.14 MIL/uL — ABNORMAL HIGH (ref 3.87–5.11)
RDW: 14 % (ref 11.5–15.5)
WBC: 5.6 10*3/uL (ref 4.0–10.5)
nRBC: 0 % (ref 0.0–0.2)

## 2023-04-02 LAB — VITAMIN D 25 HYDROXY (VIT D DEFICIENCY, FRACTURES): Vit D, 25-Hydroxy: 23.44 ng/mL — ABNORMAL LOW (ref 30–100)

## 2023-04-02 LAB — COMPREHENSIVE METABOLIC PANEL
ALT: 53 U/L — ABNORMAL HIGH (ref 0–44)
AST: 44 U/L — ABNORMAL HIGH (ref 15–41)
Albumin: 4.3 g/dL (ref 3.5–5.0)
Alkaline Phosphatase: 60 U/L (ref 38–126)
Anion gap: 11 (ref 5–15)
BUN: 21 mg/dL — ABNORMAL HIGH (ref 6–20)
CO2: 25 mmol/L (ref 22–32)
Calcium: 10.7 mg/dL — ABNORMAL HIGH (ref 8.9–10.3)
Chloride: 102 mmol/L (ref 98–111)
Creatinine, Ser: 0.88 mg/dL (ref 0.44–1.00)
GFR, Estimated: >60 mL/min (ref >60)
Glucose, Bld: 159 mg/dL — ABNORMAL HIGH (ref 70–99)
Potassium: 4.2 mmol/L (ref 3.5–5.1)
Sodium: 138 mmol/L (ref 135–145)
Total Bilirubin: 0.7 mg/dL (ref 0.3–1.2)
Total Protein: 7.9 g/dL (ref 6.5–8.1)

## 2023-04-08 NOTE — Progress Notes (Signed)
[facility] [address] [place] [address]    Clinic Day:  04/09/2023  Referring physician: Assunta Found, MD  Patient Care Team: Assunta Found, MD as PCP - General (Family Medicine) Ovidio Kin, MD as Consulting Physician (General Surgery) Serena Croissant, MD as Consulting Physician (Hematology and Oncology) Dorothy Puffer, MD as Consulting Physician (Radiation Oncology)   ASSESSMENT & PLAN:   Assessment: 1.  Clinical T2N0 HER-2 positive breast cancer: -Biopsy of the right breast upper inner quadrant 1 o'clock position mass, IDC, ER/PR positive, HER-2 positive, Ki 67 to 60%. -Neoadjuvant chemotherapy with 6 cycles of TCHP from 05/29/2017 through 10/09/2017. -Right lumpectomy and lymph node biopsy on 12/09/2017, with lumpectomy #1 specimen with no malignancy, lumpectomy #2 specimen showing 2 cm IDC, grade 3, 1 out of 6 lymph nodes positive for malignancy. -Based on Goddard trial, started on Kadcyla 3.6 mcg/kg for 14 cycle started on 01/20/2018 through 11/04/2018. - Unable to tolerate letrozole. - Anastrozole started on 05/05/2018   2.  Social/family history: - She is a retired Lawyer.  Non-smoker.  Exposure to passive smoking present. - Paternal aunt had ovarian cancer.  Father had prostate cancer.    Plan: 1.  T2 N0 HER2 positive right breast cancer: - Physical exam: No palpable adenopathy. - Mammogram (02/12/2023): BI-RADS Category 2. - Labs: Elevated LFTs stable.  CBC grossly normal. - She is tolerating anastrozole daily very well. - We talked about extending antiestrogen therapy to 10 years because of her high risk cancer and good tolerance to anastrozole.  She is agreeable.  We will continue anastrozole for total of 10 years.  She will complete 5 years in July.  After next visit, we will switch her to once a year visits.   2.  Osteopenia: - DEXA scan on 11/22/2020: T-score -2.2.  T-score -1.5 on 08/25/2018. - She is taking vitamin D 2000 units daily.  Vitamin  D level is low at 23.  I would not change the dose as her calcium is 10.7 and she has calcium kidney stones.  Will recheck vitamin D next visit. - I plan to repeat DEXA scan in 6 months.  If there is any worsening of T-score, will consider starting her on Prolia every 6 months.   3.  Mild thrombocytopenia/elevated LFTs: - AST and ALT elevated but stable from fatty liver as evidenced on CT scan from 02/23/2018.       Orders Placed This Encounter  Procedures   MM 3D SCREENING MAMMOGRAM BILATERAL BREAST    NAS No implants NMD-Bangle    Standing Status:   Future    Standing Expiration Date:   04/08/2024    Order Specific Question:   Reason for Exam (SYMPTOM  OR DIAGNOSIS REQUIRED)    Answer:   breast cancer screening    Order Specific Question:   Is the patient pregnant?    Answer:   No    Order Specific Question:   Preferred imaging location?    Answer:   [facility]   DG Bone Density    Standing Status:   Future    Standing Expiration Date:   04/08/2024    Order Specific Question:   Reason for Exam (SYMPTOM  OR DIAGNOSIS REQUIRED)    Answer:   drug therapy - anastrozole    Order Specific Question:   Is patient pregnant?    Answer:   No    Order Specific Question:   Preferred imaging location?    Answer:  [facility]      I,Katie Daubenspeck,acting as a scribe for Doreatha Massed, MD.,have documented all relevant documentation on the behalf of Doreatha Massed, MD,as directed by  Doreatha Massed, MD while in the presence of Doreatha Massed, MD.   I, Doreatha Massed MD, have reviewed the above documentation for accuracy and completeness, and I agree with the above.   Doreatha Massed, MD   6/27/202412:27 PM  CHIEF COMPLAINT:   Diagnosis: right breast cancer    Cancer Staging  Malignant neoplasm of upper-inner quadrant of right breast in female, estrogen receptor positive (HCC) Staging form: Breast, AJCC 8th Edition - Clinical stage  from 04/22/2017: Stage IB (cT2, cN0, cM0, G3, ER+, PR+, HER2+) - Unsigned    Prior Therapy: 1. Neoadjuvant TCHP, 6 cycles, 05/29/17 - 10/09/17 2. Right lumpectomy with SLNB, 12/09/17 3. XRT to right breast and nodes, 02/22/18 - 04/08/18 4. Kadcyla, 14 cycles, 01/20/18 - 11/04/18  Current Therapy:  anastrozole   HISTORY OF PRESENT ILLNESS:   Oncology History  Malignant neoplasm of upper-inner quadrant of right breast in female, estrogen receptor positive (HCC)  04/13/2017 Initial Diagnosis   Screening detected right breast mass with calcifications 2.1 cm in size and 3.4 cm in size. Calcifications were fibroadenoma. Right breast biopsy upper inner quadrant 1:00 mass: IDC with DCIS with necrosis, grade 3, ER 100%, PR 70%, Ki-67 60%, HER-2 positive ratio 2.34, T2 N1 stage IB (New AJCC)   04/25/2017 Breast MRI   Right breast upper inner quadrant 2.2 x 1.7 x 2.7 cm mass, abnormal area of clumped non-mass enhancement in the right lateral breast 3.2 x 1.9 x 1 cm, too abnormal lymph nodes right axilla 4.3 and 1.7 cm (biopsy-proven breast cancer)    05/08/2017 Procedure   Right axilla lymph node biopsy: Metastatic carcinoma   05/13/2017 Procedure   Right breast biopsy retroareolar region: Intraductal papilloma   01/20/2018 - 11/04/2018 Chemotherapy   Patient is on Treatment Plan : BREAST ADO-Trastuzumab Emtansine (Kadcyla) q21d        INTERVAL HISTORY:   Renee Harris is a 62 y.o. female presenting to clinic today for follow up of right breast cancer. She was last seen by me on 10/10/22.  Since her last visit, she underwent annual mammogram on 02/12/23 showing stable lumpectomy changes with no mammographic evidence of malignancy bilaterally.  Today, she states that she is doing well overall. Her appetite level is at 100%. Her energy level is at 80%.  PAST MEDICAL HISTORY:   Past Medical History: Past Medical History:  Diagnosis Date   Asthma    Blood transfusion    as child   Breast cancer (HCC)  2018   Cancer (HCC)    Diabetes (HCC)    type 2    GERD (gastroesophageal reflux disease)    Headache    Heart murmur    d/t aortic stenosis    History of kidney stones    Hyperlipidemia    Hypertension    Normal echocardiogram    Personal history of chemotherapy 2018   Personal history of radiation therapy 2018   Sleep apnea     Surgical History: Past Surgical History:  Procedure Laterality Date   ABDOMINAL HYSTERECTOMY  2004   BREAST LUMPECTOMY Right 2018   BREAST LUMPECTOMY WITH RADIOACTIVE SEED AND SENTINEL LYMPH NODE BIOPSY Right 12/09/2017   Procedure: RIGHT BREAST LUMPECTOMY WITH RADIOACTIVE SEED AND SENTINEL LYMPH NODE BIOPSY AND RADIOACTIVE SEED TARGETED RIGHT AXILLARY LYMPH NODE EXCISION;  Surgeon: Ovidio Kin, MD;  Location: [facility] OR;  Service: General;  Laterality: Right;   CHOLECYSTECTOMY  05/30/2011   Procedure: LAPAROSCOPIC CHOLECYSTECTOMY;  Surgeon: Fabio Bering;  Location: [facility]S;  Service: General;  Laterality: N/A;   EYE SURGERY     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, on both eyes   KIDNEY STONE SURGERY     2017 2-23   PORT-A-CATH REMOVAL Left 05/10/2020   Procedure: REMOVAL OF POWER PORT;  Surgeon: Ovidio Kin, MD;  Location: [facility];  Service: General;  Laterality: Left;   PORTACATH PLACEMENT N/A 05/25/2017   Procedure: INSERTION PORT-A-CATH;  Surgeon: Ovidio Kin, MD;  Location: [facility];  Service: General;  Laterality: N/A;    Social History: Social History   Socioeconomic History   Marital status: Married    Spouse name: Not on file   Number of children: Not on file   Years of education: Not on file   Highest education level: Not on file  Occupational History   Not on file  Tobacco Use   Smoking status: Never   Smokeless tobacco: Never  Vaping Use   Vaping Use: Never used  Substance and Sexual Activity   Alcohol use: Not Currently   Drug use: No   Sexual activity: Yes    Birth control/protection: Surgical  Other Topics  Concern   Not on file  Social History Narrative   Not on file   Social Determinants of Health   Financial Resource Strain: Not on file  Food Insecurity: Not on file  Transportation Needs: Not on file  Physical Activity: Not on file  Stress: Not on file  Social Connections: Not on file  Intimate Partner Violence: Not At Risk (05/25/2018)   Humiliation, Afraid, Rape, and Kick questionnaire    Fear of Current or Ex-Partner: No    Emotionally Abused: No    Physically Abused: No    Sexually Abused: No    Family History: Family History  Problem Relation Age of Onset   Hypertension Mother    Diabetes Mother    Heart attack Mother    Hypertension Father    Diabetes Father    Lung cancer Maternal Grandmother    Anesthesia problems Neg Hx    Hypotension Neg Hx    Malignant hyperthermia Neg Hx    Pseudochol deficiency Neg Hx     Current Medications:  Current Outpatient Medications:    albuterol (PROVENTIL HFA;VENTOLIN HFA) 108 (90 BASE) MCG/ACT inhaler, Inhale 1 puff into the lungs every 6 (six) hours as needed for wheezing or shortness of breath., Disp: , Rfl:    celecoxib (CELEBREX) 200 MG capsule, Take 200 mg by mouth daily as needed., Disp: , Rfl:    cholecalciferol (VITAMIN D3) 25 MCG (1000 UT) tablet, Take 2,000 Units by mouth daily., Disp: , Rfl:    cyclobenzaprine (FLEXERIL) 10 MG tablet, Take 10 mg by mouth every 8 (eight) hours as needed., Disp: , Rfl:    escitalopram (LEXAPRO) 20 MG tablet, Take 10 mg by mouth at bedtime. , Disp: , Rfl:    fenofibrate 160 MG tablet, Take 160 mg by mouth at bedtime. , Disp: , Rfl:    glucose blood (ONETOUCH VERIO) test strip, Use as instructed to monitor glucose 4 times daily, Disp: 100 each, Rfl: 12   insulin degludec (TRESIBA FLEXTOUCH) 100 UNIT/ML FlexTouch Pen, Inject 45 Units into the skin at bedtime., Disp: 45 mL, Rfl: 3   losartan (COZAAR) 50 MG tablet, Take 25 mg by mouth at bedtime.,  Disp: , Rfl:    meclizine (ANTIVERT) 25 MG  tablet, Take 1 tablet (25 mg total) by mouth 3 (three) times daily as needed for dizziness., Disp: 15 tablet, Rfl: 0   metFORMIN (GLUCOPHAGE) 500 MG tablet, Take 1 tablet (500 mg total) by mouth 2 (two) times daily with a meal., Disp: 180 tablet, Rfl: 0   Omega-3 Fatty Acids (FISH OIL) 1200 MG CAPS, Take 2,400 mg by mouth daily., Disp: , Rfl:    omeprazole (PRILOSEC) 40 MG capsule, Take 1 capsule (40 mg total) by mouth daily., Disp: 30 capsule, Rfl: 5   Skin Protectants, Misc. (EUCERIN) cream, Apply 1 application. topically as needed for dry skin., Disp: , Rfl:    SURE COMFORT PEN NEEDLES 31G X 8 MM MISC, USING DAILY WITH TRESBIA., Disp: 100 each, Rfl: 0   anastrozole (ARIMIDEX) 1 MG tablet, TAKE (1) TABLET BY MOUTH ONCE DAILY., Disp: 90 tablet, Rfl: 3   Allergies: Allergies  Allergen Reactions   Mestinon [Pyridostigmine]    Latex Itching   Pyridostigmine Bromide Other (See Comments)    Makes muscles twitch   Statins Nausea Only    REVIEW OF SYSTEMS:   Review of Systems  Constitutional:  Negative for chills, fatigue and fever.  HENT:   Negative for lump/mass, mouth sores, nosebleeds, sore throat and trouble swallowing.   Eyes:  Negative for eye problems.  Respiratory:  Negative for cough and shortness of breath.   Cardiovascular:  Negative for chest pain, leg swelling and palpitations.  Gastrointestinal:  Positive for constipation and diarrhea. Negative for abdominal pain, nausea and vomiting.  Genitourinary:  Negative for bladder incontinence, difficulty urinating, dysuria, frequency, hematuria and nocturia.   Musculoskeletal:  Negative for arthralgias, back pain, flank pain, myalgias and neck pain.  Skin:  Negative for itching and rash.  Neurological:  Positive for dizziness and headaches. Negative for numbness.  Hematological:  Does not bruise/bleed easily.  Psychiatric/Behavioral:  Negative for depression, sleep disturbance and suicidal ideas. The patient is not nervous/anxious.    All other systems reviewed and are negative.    VITALS:   Blood pressure (!) 150/85, pulse 83, temperature 97.8 F (36.6 C), temperature source Oral, resp. rate 18, weight 237 lb 12.8 oz (107.9 kg), SpO2 97 %.  Wt Readings from Last 3 Encounters:  04/09/23 237 lb 12.8 oz (107.9 kg)  12/17/22 233 lb (105.7 kg)  10/09/22 236 lb 15.9 oz (107.5 kg)    Body mass index is 44.2 kg/m.  Performance status (ECOG): 1 - Symptomatic but completely ambulatory  PHYSICAL EXAM:   Physical Exam Vitals and nursing note reviewed. Exam conducted with a chaperone present.  Constitutional:      Appearance: Normal appearance.  Cardiovascular:     Rate and Rhythm: Normal rate and regular rhythm.     Pulses: Normal pulses.     Heart sounds: Normal heart sounds.  Pulmonary:     Effort: Pulmonary effort is normal.     Breath sounds: Normal breath sounds.  Abdominal:     Palpations: Abdomen is soft. There is no hepatomegaly, splenomegaly or mass.     Tenderness: There is no abdominal tenderness.  Musculoskeletal:     Right lower leg: No edema.     Left lower leg: No edema.  Lymphadenopathy:     Cervical: No cervical adenopathy.     Right cervical: No superficial, deep or posterior cervical adenopathy.    Left cervical: No superficial, deep or posterior cervical adenopathy.  Upper Body:     Right upper body: No supraclavicular or axillary adenopathy.     Left upper body: No supraclavicular or axillary adenopathy.  Neurological:     General: No focal deficit present.     Mental Status: She is alert and oriented to person, place, and time.  Psychiatric:        Mood and Affect: Mood normal.        Behavior: Behavior normal.     LABS:      Latest Ref Rng & Units 04/02/2023   10:50 AM 10/09/2022    9:04 AM 10/03/2020   10:18 AM  CBC  WBC 4.0 - 10.5 K/uL 5.6  5.3  4.2   Hemoglobin 12.0 - 15.0 g/dL 60.4  54.0  98.1   Hematocrit 36.0 - 46.0 % 43.6  40.7  40.7   Platelets 150 - 400 K/uL  192  138  132       Latest Ref Rng & Units 04/02/2023   10:50 AM 10/09/2022    9:06 AM 10/09/2022    9:04 AM  CMP  Glucose 70 - 99 mg/dL 191   478   BUN 6 - 20 mg/dL 21   17   Creatinine 2.95 - 1.00 mg/dL 6.21   3.08   Sodium 657 - 145 mmol/L 138   137   Potassium 3.5 - 5.1 mmol/L 4.2   4.1   Chloride 98 - 111 mmol/L 102   103   CO2 22 - 32 mmol/L 25   26   Calcium 8.9 - 10.3 mg/dL 84.6  96.2  95.2   Total Protein 6.5 - 8.1 g/dL 7.9   7.7   Total Bilirubin 0.3 - 1.2 mg/dL 0.7   0.5   Alkaline Phos 38 - 126 U/L 60   63   AST 15 - 41 U/L 44   49   ALT 0 - 44 U/L 53   49      No results found for: "CEA1", "CEA" / No results found for: "CEA1", "CEA" No results found for: "PSA1" No results found for: "WUX324" No results found for: "CAN125"  No results found for: "TOTALPROTELP", "ALBUMINELP", "A1GS", "A2GS", "BETS", "BETA2SER", "GAMS", "MSPIKE", "SPEI" No results found for: "TIBC", "FERRITIN", "IRONPCTSAT" Lab Results  Component Value Date   LDH 104 10/09/2022   LDH 118 10/03/2020   LDH 102 10/08/2018     STUDIES:   No results found.

## 2023-04-09 ENCOUNTER — Inpatient Hospital Stay (HOSPITAL_BASED_OUTPATIENT_CLINIC_OR_DEPARTMENT_OTHER): Payer: Commercial Managed Care - PPO | Admitting: Hematology

## 2023-04-09 VITALS — BP 150/85 | HR 83 | Temp 97.8°F | Resp 18 | Wt 237.8 lb

## 2023-04-09 DIAGNOSIS — C50211 Malignant neoplasm of upper-inner quadrant of right female breast: Secondary | ICD-10-CM | POA: Diagnosis not present

## 2023-04-09 DIAGNOSIS — Z170 Estrogen receptor positive status [ER+]: Secondary | ICD-10-CM | POA: Diagnosis not present

## 2023-04-09 DIAGNOSIS — Z79899 Other long term (current) drug therapy: Secondary | ICD-10-CM

## 2023-04-09 MED ORDER — ANASTROZOLE 1 MG PO TABS
ORAL_TABLET | ORAL | 3 refills | Status: DC
Start: 2023-04-09 — End: 2024-05-09

## 2023-04-09 NOTE — Patient Instructions (Signed)
[facility] at [facility] Discharge Instructions   You were seen and examined today by Dr. Ellin Saba.  He reviewed the results of your lab work which are mostly normal/stable. Your vitamin D is low, but Dr. Kirtland Bouchard would like you to continue the same dose of Vitamin D.   We will arrange for you to have a bone density prior to your next visit.   Continue anastozole. Dr. Kirtland Bouchard would like you to continue this for a total of 10 years, so you will continue taking for another 5 years.   We will see you back in 6 months after the bone density test.    Thank you for choosing [facility] at [facility] to provide your oncology and hematology care.  To afford each patient quality time with our provider, please arrive at least 15 minutes before your scheduled appointment time.   If you have a lab appointment with the Cancer Center please come in thru the Main Entrance and check in at the main information desk.  You need to re-schedule your appointment should you arrive 10 or more minutes late.  We strive to give you quality time with our providers, and arriving late affects you and other patients whose appointments are after yours.  Also, if you no show three or more times for appointments you may be dismissed from the clinic at the providers discretion.     Again, thank you for choosing [facility].  Our hope is that these requests will decrease the amount of time that you wait before being seen by our physicians.       _____________________________________________________________  Should you have questions after your visit to [facility], please contact our office at [phone] and follow the prompts.  Our office hours are 8:00 a.m. and 4:30 p.m. Monday - Friday.  Please note that voicemails left after 4:00 p.m. may not be returned until the following business day.  We are closed weekends and major holidays.  You do have access to a  nurse 24-7, just call the main number to the clinic [phone] and do not press any options, hold on the line and a nurse will answer the phone.    For prescription refill requests, have your pharmacy contact our office and allow 72 hours.    Due to Covid, you will need to wear a mask upon entering the hospital. If you do not have a mask, a mask will be given to you at the Main Entrance upon arrival. For doctor visits, patients may have 1 support person age 53 or older with them. For treatment visits, patients can not have anyone with them due to social distancing guidelines and our immunocompromised population.

## 2023-04-17 ENCOUNTER — Other Ambulatory Visit: Payer: Self-pay | Admitting: Nurse Practitioner

## 2023-04-23 ENCOUNTER — Ambulatory Visit: Payer: Commercial Managed Care - PPO | Admitting: Nurse Practitioner

## 2023-04-23 ENCOUNTER — Encounter: Payer: Self-pay | Admitting: Nurse Practitioner

## 2023-04-23 VITALS — BP 118/76 | HR 88 | Ht 61.5 in | Wt 235.4 lb

## 2023-04-23 DIAGNOSIS — Z794 Long term (current) use of insulin: Secondary | ICD-10-CM | POA: Diagnosis not present

## 2023-04-23 DIAGNOSIS — E782 Mixed hyperlipidemia: Secondary | ICD-10-CM | POA: Diagnosis not present

## 2023-04-23 DIAGNOSIS — E1165 Type 2 diabetes mellitus with hyperglycemia: Secondary | ICD-10-CM | POA: Diagnosis not present

## 2023-04-23 DIAGNOSIS — I10 Essential (primary) hypertension: Secondary | ICD-10-CM

## 2023-04-23 DIAGNOSIS — Z7984 Long term (current) use of oral hypoglycemic drugs: Secondary | ICD-10-CM | POA: Diagnosis not present

## 2023-04-23 LAB — POCT GLYCOSYLATED HEMOGLOBIN (HGB A1C): Hemoglobin A1C: 7.1 % — AB (ref 4.0–5.6)

## 2023-04-23 NOTE — Progress Notes (Signed)
Endocrinology Follow Up Note       04/23/2023, 10:16 AM   Subjective:    Patient ID: Renee Harris, female    DOB: 09-28-1961.  Renee Harris is being seen in follow up after being seen in consultation for management of currently uncontrolled symptomatic diabetes requested by  Assunta Found, MD.   Past Medical History:  Diagnosis Date   Asthma    Blood transfusion    as child   Breast cancer (HCC) 2018   Cancer (HCC)    Diabetes (HCC)    type 2    GERD (gastroesophageal reflux disease)    Headache    Heart murmur    d/t aortic stenosis    History of kidney stones    Hyperlipidemia    Hypertension    Normal echocardiogram    Personal history of chemotherapy 2018   Personal history of radiation therapy 2018   Sleep apnea     Past Surgical History:  Procedure Laterality Date   ABDOMINAL HYSTERECTOMY  2004   BREAST LUMPECTOMY Right 2018   BREAST LUMPECTOMY WITH RADIOACTIVE SEED AND SENTINEL LYMPH NODE BIOPSY Right 12/09/2017   Procedure: RIGHT BREAST LUMPECTOMY WITH RADIOACTIVE SEED AND SENTINEL LYMPH NODE BIOPSY AND RADIOACTIVE SEED TARGETED RIGHT AXILLARY LYMPH NODE EXCISION;  Surgeon: Ovidio Kin, MD;  Location: [facility] OR;  Service: General;  Laterality: Right;   CHOLECYSTECTOMY  05/30/2011   Procedure: LAPAROSCOPIC CHOLECYSTECTOMY;  Surgeon: Fabio Bering;  Location: [facility];  Service: General;  Laterality: N/A;   EYE SURGERY     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, on both eyes   KIDNEY STONE SURGERY     2017 2-23   PORT-A-CATH REMOVAL Left 05/10/2020   Procedure: REMOVAL OF POWER PORT;  Surgeon: Ovidio Kin, MD;  Location: [facility];  Service: General;  Laterality: Left;   PORTACATH PLACEMENT N/A 05/25/2017   Procedure: INSERTION PORT-A-CATH;  Surgeon: Ovidio Kin, MD;  Location: [facility];  Service: General;  Laterality: N/A;    Social History   Socioeconomic History   Marital  status: Married    Spouse name: Not on file   Number of children: Not on file   Years of education: Not on file   Highest education level: Not on file  Occupational History   Not on file  Tobacco Use   Smoking status: Never   Smokeless tobacco: Never  Vaping Use   Vaping status: Never Used  Substance and Sexual Activity   Alcohol use: Not Currently   Drug use: No   Sexual activity: Yes    Birth control/protection: Surgical  Other Topics Concern   Not on file  Social History Narrative   Not on file   Social Determinants of Health   Financial Resource Strain: Not on file  Food Insecurity: Not on file  Transportation Needs: Not on file  Physical Activity: Not on file  Stress: Not on file  Social Connections: Not on file    Family History  Problem Relation Age of Onset   Hypertension Mother    Diabetes Mother    Heart attack Mother    Hypertension Father    Diabetes Father  Lung cancer Maternal Grandmother    Anesthesia problems Neg Hx    Hypotension Neg Hx    Malignant hyperthermia Neg Hx    Pseudochol deficiency Neg Hx     Outpatient Encounter Medications as of 04/23/2023  Medication Sig   albuterol (PROVENTIL HFA;VENTOLIN HFA) 108 (90 BASE) MCG/ACT inhaler Inhale 1 puff into the lungs every 6 (six) hours as needed for wheezing or shortness of breath.   anastrozole (ARIMIDEX) 1 MG tablet TAKE (1) TABLET BY MOUTH ONCE DAILY.   celecoxib (CELEBREX) 200 MG capsule Take 200 mg by mouth daily as needed.   cholecalciferol (VITAMIN D3) 25 MCG (1000 UT) tablet Take 2,000 Units by mouth daily.   cyclobenzaprine (FLEXERIL) 10 MG tablet Take 10 mg by mouth every 8 (eight) hours as needed.   escitalopram (LEXAPRO) 20 MG tablet Take 10 mg by mouth at bedtime.    fenofibrate 160 MG tablet Take 160 mg by mouth at bedtime.    glucose blood (ONETOUCH VERIO) test strip Use as instructed to monitor glucose 4 times daily   insulin degludec (TRESIBA FLEXTOUCH) 100 UNIT/ML FlexTouch  Pen Inject 45 Units into the skin at bedtime.   losartan (COZAAR) 50 MG tablet Take 25 mg by mouth at bedtime.   meclizine (ANTIVERT) 25 MG tablet Take 1 tablet (25 mg total) by mouth 3 (three) times daily as needed for dizziness.   Omega-3 Fatty Acids (FISH OIL) 1200 MG CAPS Take 2,400 mg by mouth daily.   omeprazole (PRILOSEC) 40 MG capsule Take 1 capsule (40 mg total) by mouth daily.   Skin Protectants, Misc. (EUCERIN) cream Apply 1 application. topically as needed for dry skin.   SURE COMFORT PEN NEEDLES 31G X 8 MM MISC USING DAILY WITH TRESBIA.   [DISCONTINUED] metFORMIN (GLUCOPHAGE) 500 MG tablet Take 1 tablet (500 mg total) by mouth 2 (two) times daily with a meal.   No facility-administered encounter medications on file as of 04/23/2023.    ALLERGIES: Allergies  Allergen Reactions   Mestinon [Pyridostigmine]    Latex Itching   Pyridostigmine Bromide Other (See Comments)    Makes muscles twitch   Statins Nausea Only    VACCINATION STATUS: Immunization History  Administered Date(s) Administered   Influenza Split 08/19/2016   Influenza,inj,Quad PF,6+ Mos 08/17/2017, 08/19/2018   Influenza-Unspecified 08/06/2022    Diabetes She presents for her follow-up diabetic visit. She has type 2 diabetes mellitus. Onset time: Diagnosed at approx age of 45. Her disease course has been fluctuating. There are no hypoglycemic associated symptoms. Associated symptoms include fatigue. Pertinent negatives for diabetes include no polydipsia, no polyuria and no weight loss. There are no hypoglycemic complications. Symptoms are stable. Diabetic complications include nephropathy. Risk factors for coronary artery disease include diabetes mellitus, dyslipidemia, family history, obesity, hypertension and sedentary lifestyle. Current diabetic treatment includes insulin injections and oral agent (monotherapy). She is compliant with treatment most of the time. Her weight is fluctuating minimally. She is  following a generally healthy diet. When asked about meal planning, she reported none. She has not had a previous visit with a dietitian. She rarely participates in exercise. Her home blood glucose trend is increasing steadily. Her breakfast blood glucose range is generally >200 mg/dl. Her bedtime blood glucose range is generally >200 mg/dl. Her overall blood glucose range is >200 mg/dl. (She presents today with her logs, no meter, showing above target glycemic profile.  Her POCT A1c today is 7.1%, increasing from last visit of 6.8%.  She denies any hypoglycemia.  She notes she does eat more snacks at night recently and has been waking up with gas pains (she thinks stemming from the Metformin).) An ACE inhibitor/angiotensin II receptor blocker is being taken. She does not see a podiatrist.Eye exam is current.  Hyperlipidemia This is a chronic problem. The current episode started more than 1 year ago. The problem is uncontrolled. Recent lipid tests were reviewed and are variable. Exacerbating diseases include chronic renal disease, diabetes and obesity. Factors aggravating her hyperlipidemia include fatty foods. Current antihyperlipidemic treatment includes fibric acid derivatives. The current treatment provides mild improvement of lipids. Compliance problems include medication side effects, adherence to diet and adherence to exercise.  Risk factors for coronary artery disease include diabetes mellitus, dyslipidemia, family history, obesity, hypertension and a sedentary lifestyle.  Hypertension This is a chronic problem. The current episode started more than 1 year ago. The problem has been resolved since onset. The problem is controlled. There are no associated agents to hypertension. Risk factors for coronary artery disease include diabetes mellitus, dyslipidemia, family history, obesity and sedentary lifestyle. Past treatments include angiotensin blockers. The current treatment provides moderate improvement.  Compliance problems include diet and exercise.  Hypertensive end-organ damage includes kidney disease. Identifiable causes of hypertension include chronic renal disease.     Review of systems  Constitutional: + stable body weight, current Body mass index is 43.76 kg/m., no fatigue, no subjective hyperthermia, no subjective hypothermia Eyes: no blurry vision, no xerophthalmia ENT: no sore throat, no nodules palpated in throat, no dysphagia/odynophagia, no hoarseness Cardiovascular: no chest pain, no shortness of breath, no palpitations, no leg swelling Respiratory: no cough, no shortness of breath Gastrointestinal: no nausea/vomiting/diarrhea Musculoskeletal: no muscle/joint aches Skin: no rashes, no hyperemia Neurological: no tremors, no numbness, no tingling, Psychiatric: no depression, no anxiety  Objective:     BP 118/76 (BP Location: Left Arm, Patient Position: Sitting, Cuff Size: Large)   Pulse 88   Ht 5' 1.5" (1.562 m)   Wt 235 lb 6.4 oz (106.8 kg)   BMI 43.76 kg/m   Wt Readings from Last 3 Encounters:  04/23/23 235 lb 6.4 oz (106.8 kg)  04/09/23 237 lb 12.8 oz (107.9 kg)  12/17/22 233 lb (105.7 kg)     BP Readings from Last 3 Encounters:  04/23/23 118/76  04/09/23 (!) 150/85  12/17/22 104/67      Physical Exam- Limited  Constitutional:  Body mass index is 43.76 kg/m. , not in acute distress, normal state of mind Eyes:  EOMI, no exophthalmos Musculoskeletal: no gross deformities, strength intact in all four extremities, no gross restriction of joint movements Skin:  no rashes, no hyperemia Neurological: no tremor with outstretched hands   Diabetic Foot Exam - Simple   No data filed     CMP ( most recent) CMP     Component Value Date/Time   NA 138 04/02/2023 1050   NA 142 04/22/2017 0831   K 4.2 04/02/2023 1050   K 4.1 04/22/2017 0831   CL 102 04/02/2023 1050   CO2 25 04/02/2023 1050   CO2 24 04/22/2017 0831   GLUCOSE 159 (H) 04/02/2023 1050    GLUCOSE 176 (H) 04/22/2017 0831   BUN 21 (H) 04/02/2023 1050   BUN 20 08/27/2021 0000   BUN 15.3 04/22/2017 0831   CREATININE 0.88 04/02/2023 1050   CREATININE 1.0 04/22/2017 0831   CALCIUM 10.7 (H) 04/02/2023 1050   CALCIUM 10.8 (H) 10/09/2022 0906   CALCIUM 10.7 (H) 04/22/2017 0831   PROT 7.9  04/02/2023 1050   PROT 7.6 04/22/2017 0831   ALBUMIN 4.3 04/02/2023 1050   ALBUMIN 4.0 04/22/2017 0831   AST 44 (H) 04/02/2023 1050   AST 29 04/22/2017 0831   ALT 53 (H) 04/02/2023 1050   ALT 35 04/22/2017 0831   ALKPHOS 60 04/02/2023 1050   ALKPHOS 68 04/22/2017 0831   BILITOT 0.7 04/02/2023 1050   BILITOT 0.51 04/22/2017 0831   GFRNONAA >60 04/02/2023 1050   GFRAA >60 02/21/2020 1322     Diabetic Labs (most recent): Lab Results  Component Value Date   HGBA1C 7.1 (A) 04/23/2023   HGBA1C 6.8 12/17/2022   HGBA1C 7.5 (A) 09/17/2022   MICROALBUR 150 12/17/2022   MICROALBUR 34 08/27/2021     Lipid Panel ( most recent) Lipid Panel     Component Value Date/Time   CHOL 199 10/11/2018 1237   TRIG 681 (A) 08/27/2021 0000   HDL 23 (L) 10/11/2018 1237   CHOLHDL 8.7 10/11/2018 1237   VLDL 54 (H) 10/11/2018 1237   LDLCALC 97 08/27/2021 0000      Lab Results  Component Value Date   TSH 2.11 08/27/2021   TSH 2.382 03/23/2018           Assessment & Plan:   1) Type 2 diabetes mellitus with hyperglycemia, without long-term current use of insulin (HCC)  She presents today with her logs, no meter, showing above target glycemic profile.  Her POCT A1c today is 7.1%, increasing from last visit of 6.8%.  She denies any hypoglycemia.  She notes she does eat more snacks at night recently and has been waking up with gas pains (she thinks stemming from the Metformin).  - Renee Harris has currently uncontrolled symptomatic type 2 DM since 62 years of age.   -Recent labs reviewed.    The following Lifestyle Medicine recommendations according to American College of Lifestyle Medicine  [facility]) were discussed and offered to patient and she agrees to start the journey:  A. Whole Foods, Plant-based plate comprising of fruits and vegetables, plant-based proteins, whole-grain carbohydrates was discussed in detail with the patient.   A list for source of those nutrients were also provided to the patient.  Patient will use only water or unsweetened tea for hydration. B.  The need to stay away from risky substances including alcohol, smoking; obtaining 7 to 9 hours of restorative sleep, at least 150 minutes of moderate intensity exercise weekly, the importance of healthy social connections,  and stress reduction techniques were discussed. C.  A full color page of  Calorie density of various food groups per pound showing examples of each food groups was provided to the patient.  - Nutritional counseling repeated at each appointment due to patients tendency to fall back in to old habits.  - The patient admits there is a room for improvement in their diet and drink choices. -  Suggestion is made for the patient to avoid simple carbohydrates from their diet including Cakes, Sweet Desserts / Pastries, Ice Cream, Soda (diet and regular), Sweet Tea, Candies, Chips, Cookies, Sweet Pastries, Store Bought Juices, Alcohol in Excess of 1-2 drinks a day, Artificial Sweeteners, Coffee Creamer, and "Sugar-free" Products. This will help patient to have stable blood glucose profile and potentially avoid unintended weight gain.   - I encouraged the patient to switch to unprocessed or minimally processed complex starch and increased protein intake (animal or plant source), fruits, and vegetables.   - Patient is advised to stick to a routine mealtimes  to eat 3 meals a day and avoid unnecessary snacks (to snack only to correct hypoglycemia).  - she will be scheduled with Norm Salt, RDN, CDE for diabetes education.  - I have approached her with the following individualized plan to manage her diabetes and  patient agrees:   -She is advised to continue Tresiba 45 units SQ nightly and temporarily stop her Metformin to see if her night time gas pains resolve.  If she continues to have them, we can rule out Metformin as the cause and she can resume her  Metformin 500 mg po twice daily after meals.  I did encourage her to eliminate snacking.  -she is encouraged to continue monitoring blood glucose twice daily, before breakfast and before bed, and to call the clinic if she has readings less than 70 or above 300 for 3 tests in a row.  - Adjustment parameters are given to her for hypo and hyperglycemia in writing.  - She does note she cannot tolerate higher doses of Metformin due to GI upset.  - she is not an ideal candidate for incretin therapy given her significantly elevated triglycerides increasing her risk of pancreatitis.  - Specific targets for  A1c; LDL, HDL, and Triglycerides were discussed with the patient.  2) Blood Pressure /Hypertension:  her blood pressure is controlled to target.   she is advised to continue her current medications including Losartan 50 mg p.o. daily with breakfast.  3) Lipids/Hyperlipidemia:    Review of her recent lipid panel from 08/06/22 showed uncontrolled LDL at 153 and significantly elevated triglycerides of 349 (improving) .  She is intolerant to statins, is on Fenofibrate 160 mg po daily, Vascepa 2 g po BID, and Fish Oil supplement.   4)  Weight/Diet:  her Body mass index is 43.76 kg/m.  -  clearly complicating her diabetes care.   she is a candidate for weight loss. I discussed with her the fact that loss of 5 - 10% of her  current body weight will have the most impact on her diabetes management.  Exercise, and detailed carbohydrates information provided  -  detailed on discharge instructions.  5) Hypercalcemia She has history of kidney stones, osteopenia.  Recent calcium levels were elevated at 10.7, improved.  Did labs showing slightly elevated calcium but  normal PTH.  Her phosphorous was slightly low as a result.  Recent vitamin d level on low end of normal.  I do not suspect primary hyperparathyroidism, think more related to supplements vs higher calcium diet.  6) Chronic Care/Health Maintenance: -she is on ACEI/ARB and intolerant to Statin medications and is encouraged to initiate and continue to follow up with Ophthalmology, Dentist, Podiatrist at least yearly or according to recommendations, and advised to stay away from smoking. I have recommended yearly flu vaccine and pneumonia vaccine at least every 5 years; moderate intensity exercise for up to 150 minutes weekly; and sleep for at least 7 hours a day.  - she is advised to maintain close follow up with Assunta Found, MD for primary care needs, as well as her other providers for optimal and coordinated care.      I spent  37  minutes in the care of the patient today including review of labs from CMP, Lipids, Thyroid Function, Hematology (current and previous including abstractions from other facilities); face-to-face time discussing  her blood glucose readings/logs, discussing hypoglycemia and hyperglycemia episodes and symptoms, medications doses, her options of short and long term treatment based on the latest  standards of care / guidelines;  discussion about incorporating lifestyle medicine;  and documenting the encounter. Risk reduction counseling performed per USPSTF guidelines to reduce obesity and cardiovascular risk factors.     Please refer to Patient Instructions for Blood Glucose Monitoring and Insulin/Medications Dosing Guide"  in media tab for additional information. Please  also refer to " Patient Self Inventory" in the Media  tab for reviewed elements of pertinent patient history.  Laurette Schimke participated in the discussions, expressed understanding, and voiced agreement with the above plans.  All questions were answered to her satisfaction. she is encouraged to contact  clinic should she have any questions or concerns prior to her return visit.     Follow up plan: - Return in about 4 months (around 08/24/2023) for Diabetes F/U with A1c in office, No previsit labs, Bring meter and logs.   Ronny Bacon, [facility] [address] [place], [place] [address] Phone: [phone] Fax: [phone]  04/23/2023, 10:16 AM

## 2023-06-18 ENCOUNTER — Telehealth: Payer: Self-pay | Admitting: *Deleted

## 2023-06-18 NOTE — Telephone Encounter (Signed)
Patient was called and given Whitney's recommendation. She will call us back on Monday with blood glucose readings.

## 2023-06-18 NOTE — Telephone Encounter (Signed)
Patient called she shares that she does not feel that the insulin is working as good as it was. She is not sure if her having come off Metformin is contributing to this. Her blood sugars are running 250- 300. Pt called with high BG readings.   Date Before breakfast Before lunch Before supper Bedtime  06/17/23    303 at 9 pm  06/17/23    297 at 11 pm  06/18/23 262             Pt taking: Patient is injecting the 45 units of the sample insulin office gave her. She [place] with her sister and could not recall enough readings. She did admit that she has not been eating correctly, last night hamburger and fries with water.Since being off the Metformin, she has been eating a lot of salads. She is rotating her sites, she has not seen any insulin leaking after injection and she is not taking or has taken any antibiotics/prednisone. She asks that if she needs to go back on the Metformin, she would like to try something else.

## 2023-06-18 NOTE — Telephone Encounter (Signed)
I think we can adjust her insulin to help with her recent readings (also working on diet too).  This will help to avoid putting on new medication just yet to help contain costs.  I have her being on 45 units of Tresiba, have her increase to 55 units for now and let us know what her readings are on Monday.

## 2023-06-22 ENCOUNTER — Other Ambulatory Visit: Payer: Self-pay | Admitting: Nurse Practitioner

## 2023-06-22 ENCOUNTER — Telehealth: Payer: Self-pay | Admitting: *Deleted

## 2023-06-22 MED ORDER — GLIPIZIDE ER 5 MG PO TB24: 5.0000 mg | ORAL_TABLET | Freq: Every day | ORAL | 1 refills | Status: AC

## 2023-06-22 NOTE — Telephone Encounter (Signed)
Would she be willing to try low dose Glipizide?  I am trying not to escalate her insulin treatment any further as I know it worries her.  Also, Glipizide is pretty cheap so should be affordable for her.

## 2023-06-22 NOTE — Telephone Encounter (Signed)
Patient is willing to try the Glipizide and ask that it be sent to the [facility].

## 2023-06-22 NOTE — Telephone Encounter (Signed)
Done, I sent in for Glipizide 5 mg XL daily with breakfast for her.  I did 90 day script as they tend to be same price as the 30 day script in some cases.

## 2023-06-22 NOTE — Telephone Encounter (Signed)
Noted  

## 2023-06-22 NOTE — Telephone Encounter (Signed)
Patient has left a message that he blood sugars are not that much better. She did increase the [place] to 55 units. Pt called with high BG readings.   Date Before breakfast Before lunch Before supper Bedtime  06/19/23 264   276  06/20/23 272   300  06/21/23 270  299 226  06/22/23 240       Pt taking: Patient is injecting  [place] 55 units at bedtime, she started this on 06/19/23. She shares that this started after she was taking Metformin. She is trying to eat 3 meals a day. She has been eating a lot of salads since being off the Metformin. She uses watered down [employer] and she will dip her salad into the dressing. Drinks water mostly, if she goes out to eat she may drink a diet drink and she is drinking coffee with the splenda in the morning.. Eats beef , mainly chicken that is grilled or steamed with the lemon pepper seasoning. Does not eat a lot of bread and when she does she states that she eats wheat bread.She is not a big veggie eater.

## 2023-08-24 ENCOUNTER — Ambulatory Visit: Payer: Commercial Managed Care - PPO | Admitting: Nurse Practitioner

## 2023-10-19 ENCOUNTER — Inpatient Hospital Stay: Payer: Medicaid Other

## 2023-10-19 ENCOUNTER — Other Ambulatory Visit (HOSPITAL_COMMUNITY): Payer: Commercial Managed Care - PPO

## 2023-10-20 DIAGNOSIS — Z6841 Body Mass Index (BMI) 40.0 and over, adult: Secondary | ICD-10-CM | POA: Diagnosis not present

## 2023-10-20 DIAGNOSIS — R011 Cardiac murmur, unspecified: Secondary | ICD-10-CM | POA: Diagnosis not present

## 2023-10-20 DIAGNOSIS — E782 Mixed hyperlipidemia: Secondary | ICD-10-CM | POA: Diagnosis not present

## 2023-10-20 DIAGNOSIS — I10 Essential (primary) hypertension: Secondary | ICD-10-CM | POA: Diagnosis not present

## 2023-10-20 DIAGNOSIS — E1159 Type 2 diabetes mellitus with other circulatory complications: Secondary | ICD-10-CM | POA: Diagnosis not present

## 2023-10-22 ENCOUNTER — Ambulatory Visit: Payer: Commercial Managed Care - PPO | Admitting: Oncology

## 2023-10-29 ENCOUNTER — Inpatient Hospital Stay: Payer: Medicaid Other | Admitting: Oncology

## 2024-02-03 DIAGNOSIS — E1159 Type 2 diabetes mellitus with other circulatory complications: Secondary | ICD-10-CM | POA: Diagnosis not present

## 2024-02-03 DIAGNOSIS — I10 Essential (primary) hypertension: Secondary | ICD-10-CM | POA: Diagnosis not present

## 2024-02-03 DIAGNOSIS — E7849 Other hyperlipidemia: Secondary | ICD-10-CM | POA: Diagnosis not present

## 2024-02-03 DIAGNOSIS — R011 Cardiac murmur, unspecified: Secondary | ICD-10-CM | POA: Diagnosis not present

## 2024-02-03 DIAGNOSIS — E782 Mixed hyperlipidemia: Secondary | ICD-10-CM | POA: Diagnosis not present

## 2024-02-03 DIAGNOSIS — Z6841 Body Mass Index (BMI) 40.0 and over, adult: Secondary | ICD-10-CM | POA: Diagnosis not present

## 2024-02-15 ENCOUNTER — Ambulatory Visit (HOSPITAL_COMMUNITY): Payer: Commercial Managed Care - PPO

## 2024-02-17 ENCOUNTER — Encounter (HOSPITAL_COMMUNITY): Payer: Self-pay

## 2024-02-24 ENCOUNTER — Other Ambulatory Visit (HOSPITAL_COMMUNITY)

## 2024-02-24 ENCOUNTER — Ambulatory Visit (HOSPITAL_COMMUNITY)

## 2024-03-02 ENCOUNTER — Ambulatory Visit (HOSPITAL_COMMUNITY)
Admission: RE | Admit: 2024-03-02 | Discharge: 2024-03-02 | Disposition: A | Discharge status: Home / Self Care | Source: Ambulatory Visit | Attending: Hematology | Admitting: Hematology

## 2024-03-02 DIAGNOSIS — C50211 Malignant neoplasm of upper-inner quadrant of right female breast: Secondary | ICD-10-CM

## 2024-03-02 DIAGNOSIS — Z853 Personal history of malignant neoplasm of breast: Secondary | ICD-10-CM | POA: Insufficient documentation

## 2024-03-02 DIAGNOSIS — Z1231 Encounter for screening mammogram for malignant neoplasm of breast: Secondary | ICD-10-CM | POA: Insufficient documentation

## 2024-03-10 ENCOUNTER — Telehealth (HOSPITAL_COMMUNITY): Payer: Self-pay | Admitting: Cardiology

## 2024-03-10 NOTE — Telephone Encounter (Signed)
 [facility] in [place], [place] at [phone] and spoke to the referrals department. Front office let referral representative know that this patient (whom last saw Dr. Mitzie Anda in 2019) can now see general cardiology.   Per clinical staff at the [facility] "can see gen cards".   The referrals representative stated that she will "let the pt know".

## 2024-04-27 DIAGNOSIS — K6389 Other specified diseases of intestine: Secondary | ICD-10-CM | POA: Diagnosis not present

## 2024-04-27 DIAGNOSIS — K644 Residual hemorrhoidal skin tags: Secondary | ICD-10-CM | POA: Diagnosis not present

## 2024-04-27 DIAGNOSIS — D125 Benign neoplasm of sigmoid colon: Secondary | ICD-10-CM | POA: Diagnosis not present

## 2024-04-27 DIAGNOSIS — D124 Benign neoplasm of descending colon: Secondary | ICD-10-CM | POA: Diagnosis not present

## 2024-04-27 DIAGNOSIS — K640 First degree hemorrhoids: Secondary | ICD-10-CM | POA: Diagnosis not present

## 2024-04-27 DIAGNOSIS — K5730 Diverticulosis of large intestine without perforation or abscess without bleeding: Secondary | ICD-10-CM | POA: Diagnosis not present

## 2024-04-27 DIAGNOSIS — Z1211 Encounter for screening for malignant neoplasm of colon: Secondary | ICD-10-CM | POA: Diagnosis not present

## 2024-04-27 DIAGNOSIS — D123 Benign neoplasm of transverse colon: Secondary | ICD-10-CM | POA: Diagnosis not present

## 2024-05-07 ENCOUNTER — Other Ambulatory Visit: Payer: Self-pay | Admitting: Hematology

## 2024-05-07 DIAGNOSIS — C50211 Malignant neoplasm of upper-inner quadrant of right female breast: Secondary | ICD-10-CM

## 2024-05-09 ENCOUNTER — Other Ambulatory Visit: Payer: Self-pay | Admitting: *Deleted

## 2024-05-09 DIAGNOSIS — Z170 Estrogen receptor positive status [ER+]: Secondary | ICD-10-CM

## 2024-05-24 ENCOUNTER — Inpatient Hospital Stay

## 2024-05-24 ENCOUNTER — Inpatient Hospital Stay: Admitting: Oncology

## 2024-06-09 NOTE — Progress Notes (Unsigned)
 [facility] [address] [place], [place] [address]   CLINIC:  Medical Oncology/Hematology  PCP:  Marvine Rush, MD [address] / [place] [address] [phone]   REASON FOR VISIT:  Follow-up for RIGHT breast cancer  PRIOR THERAPY: - Neoadjuvant chemotherapy with 6 cycles TCHP (05/29/2017 through 10/09/2017) - Right lumpectomy (12/09/2017) - Kadcyla  x 14 cycles (01/20/2018 through 11/04/2018  CURRENT THERAPY: Anastrozole  (since 05/05/2018)  BRIEF ONCOLOGIC HISTORY:   Oncology History  Malignant neoplasm of upper-inner quadrant of right breast in female, estrogen receptor positive (HCC)  04/13/2017 Initial Diagnosis   Screening detected right breast mass with calcifications 2.1 cm in size and 3.4 cm in size. Calcifications were fibroadenoma. Right breast biopsy upper inner quadrant 1:00 mass: IDC with DCIS with necrosis, grade 3, ER 100%, PR 70%, Ki-67 60%, HER-2 positive ratio 2.34, T2 N1 stage IB (New AJCC)   04/25/2017 Breast MRI   Right breast upper inner quadrant 2.2 x 1.7 x 2.7 cm mass, abnormal area of clumped non-mass enhancement in the right lateral breast 3.2 x 1.9 x 1 cm, too abnormal lymph nodes right axilla 4.3 and 1.7 cm (biopsy-proven breast cancer)    05/08/2017 Procedure   Right axilla lymph node biopsy: Metastatic carcinoma   05/13/2017 Procedure   Right breast biopsy retroareolar region: Intraductal papilloma   01/20/2018 - 11/04/2018 Chemotherapy   Patient is on Treatment Plan : BREAST ADO-Trastuzumab Emtansine  (Kadcyla ) q21d       CANCER STAGING: Cancer Staging  Malignant neoplasm of upper-inner quadrant of right breast in female, estrogen receptor positive (HCC) Staging form: Breast, AJCC 8th Edition - Clinical stage from 04/22/2017: Stage IB (cT2, cN0, cM0, G3, ER+, PR+, HER2+) - Unsigned   INTERVAL HISTORY:   [name], a 63 y.o. female, returns for routine follow-up of her ***. [name] was last seen on {XX/XX/XXXX}.    At today's visit, she  reports feeling ***.  She denies any recent hospitalizations, surgeries, or changes in her  baseline health status.  ***She denies any breast lumps or axillary lymphadenopathy. ***No new onset bone pain, chest pain, dyspnea, or abdominal pain. ***She has no new headaches, seizures, or focal neurologic deficits. ***No B symptoms such as fever, chills, night sweats, unintentional weight loss.  She reports ***% energy and ***% appetite.  She is maintaining stable weight at this time.  ASSESSMENT & PLAN:  1.  Clinical T2N0 HER-2 positive breast cancer: -Biopsy of the right breast upper inner quadrant 1 o'clock position mass, IDC, ER/PR positive, HER-2 positive, Ki 67 to 60%. -Neoadjuvant chemotherapy with 6 cycles of TCHP from 05/29/2017 through 10/09/2017. -Right lumpectomy and lymph node biopsy on 12/09/2017, with lumpectomy #1 specimen with no malignancy, lumpectomy #2 specimen showing 2 cm IDC, grade 3, 1 out of 6 lymph nodes positive for malignancy. -Based on Cobden trial, started on Kadcyla  3.6 mcg/kg for 14 cycle started on 01/20/2018 through 11/04/2018. - Unable to tolerate letrozole . - Anastrozole  started on 05/05/2018.  Goal of antiestrogen therapy is 10 years due to high risk cancer and good tolerance of anastrozole . - Most recent mammogram (03/02/2024): BI-RADS Category 1, negative. - Physical exam(***): *** - Labs (***): ***Elevated LFTs stable.  ***CBC grossly normal. - She is tolerating anastrozole  daily very well.*** - PLAN: Continue daily anastrozole . - Mammogram in May 2026. - Labs and RTC in 1 year.  ***  2.  Osteopenia: - T-score -1.5 on 08/25/2018. - DEXA scan on 11/22/2020: T-score -2.2. - She is taking  vitamin D  2000 units daily - at last visit was unable to increase dose of vitamin D  due to mildly elevated calcium and history of calcium kidney stones. - Labs today *** - PLAN: She is overdue for bone density/DEXA.  ***  3.   Mild  thrombocytopenia/elevated LFTs: - LFTs elevated since 2019, with intermittent elevations prior to that - CT scan from 02/23/2018 showed fatty liver disease  4.  Social/family history: - She is a retired Lawyer.  Non-smoker.  Exposure to passive smoking present. - Paternal aunt had ovarian cancer.  Father had prostate cancer.  PLAN SUMMARY: >> *** >> *** >> ***   REVIEW OF SYSTEMS: ***  Review of Systems - Oncology  PHYSICAL EXAM:   Performance status (ECOG): {CHL ONC ED:8845999799} *** There were no vitals filed for this visit. Wt Readings from Last 3 Encounters:  04/23/23 235 lb 6.4 oz (106.8 kg)  04/09/23 237 lb 12.8 oz (107.9 kg)  12/17/22 233 lb (105.7 kg)   Physical Exam   PAST MEDICAL/SURGICAL HISTORY:  Past Medical History:  Diagnosis Date   Asthma    Blood transfusion    as child   Breast cancer (HCC) 2018   Cancer (HCC)    Diabetes (HCC)    type 2    GERD (gastroesophageal reflux disease)    Headache    Heart murmur    d/t aortic stenosis    History of kidney stones    Hyperlipidemia    Hypertension    Normal echocardiogram    Personal history of chemotherapy 2018   Personal history of radiation therapy 2018   Sleep apnea    Past Surgical History:  Procedure Laterality Date   ABDOMINAL HYSTERECTOMY  2004   BREAST LUMPECTOMY Right 2018   BREAST LUMPECTOMY WITH RADIOACTIVE SEED AND SENTINEL LYMPH NODE BIOPSY Right 12/09/2017   Procedure: RIGHT BREAST LUMPECTOMY WITH RADIOACTIVE SEED AND SENTINEL LYMPH NODE BIOPSY AND RADIOACTIVE SEED TARGETED RIGHT AXILLARY LYMPH NODE EXCISION;  Surgeon: Ethyl Lenis, MD;  Location: [facility];  Service: General;  Laterality: Right;   CHOLECYSTECTOMY  05/30/2011   Procedure: LAPAROSCOPIC CHOLECYSTECTOMY;  Surgeon: Thresa JAYSON Pulling;  Location: [facility];  Service: General;  Laterality: N/A;   EYE SURGERY     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, on both eyes   KIDNEY STONE SURGERY     2017 2-23   PORT-A-CATH REMOVAL Left 05/10/2020    Procedure: REMOVAL OF POWER PORT;  Surgeon: Ethyl Lenis, MD;  Location: [facility];  Service: General;  Laterality: Left;   PORTACATH PLACEMENT N/A 05/25/2017   Procedure: INSERTION PORT-A-CATH;  Surgeon: Ethyl Lenis, MD;  Location: [facility];  Service: General;  Laterality: N/A;    SOCIAL HISTORY:  Social History   Socioeconomic History   Marital status: Married    Spouse name: Not on file   Number of children: Not on file   Years of education: Not on file   Highest education level: Not on file  Occupational History   Not on file  Tobacco Use   Smoking status: Never   Smokeless tobacco: Never  Vaping Use   Vaping status: Never Used  Substance and Sexual Activity   Alcohol use: Not Currently   Drug use: No   Sexual activity: Yes    Birth control/protection: Surgical  Other Topics Concern   Not on file  Social History Narrative   Not on file   Social Drivers of Health   Financial Resource Strain: Not on  file  Food Insecurity: Not on file  Transportation Needs: Not on file  Physical Activity: Not on file  Stress: Not on file  Social Connections: Not on file  Intimate Partner Violence: Not At Risk (05/25/2018)   Humiliation, Afraid, Rape, and Kick questionnaire    Fear of Current or Ex-Partner: No    Emotionally Abused: No    Physically Abused: No    Sexually Abused: No    FAMILY HISTORY:  Family History  Problem Relation Age of Onset   Hypertension Mother    Diabetes Mother    Heart attack Mother    Hypertension Father    Diabetes Father    Lung cancer Maternal Grandmother    Anesthesia problems Neg Hx    Hypotension Neg Hx    Malignant hyperthermia Neg Hx    Pseudochol deficiency Neg Hx     CURRENT MEDICATIONS:  Current Outpatient Medications  Medication Sig Dispense Refill   albuterol (PROVENTIL HFA;VENTOLIN HFA) 108 (90 BASE) MCG/ACT inhaler Inhale 1 puff into the lungs every 6 (six) hours as needed for wheezing or shortness of breath.      anastrozole  (ARIMIDEX ) 1 MG tablet TAKE (1) TABLET BY MOUTH ONCE DAILY. 90 tablet 3   celecoxib (CELEBREX) 200 MG capsule Take 200 mg by mouth daily as needed.     cholecalciferol (VITAMIN D3) 25 MCG (1000 UT) tablet Take 2,000 Units by mouth daily.     cyclobenzaprine (FLEXERIL) 10 MG tablet Take 10 mg by mouth every 8 (eight) hours as needed.     escitalopram (LEXAPRO) 20 MG tablet Take 10 mg by mouth at bedtime.      fenofibrate 160 MG tablet Take 160 mg by mouth at bedtime.      glipiZIDE  (GLUCOTROL  XL) 5 MG 24 hr tablet Take 1 tablet (5 mg total) by mouth daily with breakfast. 90 tablet 1   glucose blood (ONETOUCH VERIO) test strip Use as instructed to monitor glucose 4 times daily 100 each 12   insulin  degludec (TRESIBA  FLEXTOUCH) 100 UNIT/ML FlexTouch Pen Inject 45 Units into the skin at bedtime. 45 mL 3   losartan (COZAAR) 50 MG tablet Take 25 mg by mouth at bedtime.     meclizine  (ANTIVERT ) 25 MG tablet Take 1 tablet (25 mg total) by mouth 3 (three) times daily as needed for dizziness. 15 tablet 0   Omega-3 Fatty Acids (FISH OIL) 1200 MG CAPS Take 2,400 mg by mouth daily.     omeprazole  (PRILOSEC) 40 MG capsule Take 1 capsule (40 mg total) by mouth daily. 30 capsule 5   Skin Protectants, Misc. (EUCERIN) cream Apply 1 application. topically as needed for dry skin.     SURE COMFORT PEN NEEDLES 31G X 8 MM MISC USING DAILY WITH TRESBIA. 100 each 0   No current facility-administered medications for this visit.    ALLERGIES:  Allergies  Allergen Reactions   Mestinon [Pyridostigmine]    Latex Itching   Pyridostigmine Bromide Other (See Comments)    Makes muscles twitch   Statins Nausea Only    LABORATORY DATA:  I have reviewed the labs as listed.     Latest Ref Rng & Units 04/02/2023   10:50 AM 10/09/2022    9:04 AM 10/03/2020   10:18 AM  CBC  WBC 4.0 - 10.5 K/uL 5.6  5.3  4.2   Hemoglobin 12.0 - 15.0 g/dL 85.4  86.3  86.7   Hematocrit 36.0 - 46.0 % 43.6  40.7  40.7  Platelets 150 - 400 K/uL 192  138  132       Latest Ref Rng & Units 04/02/2023   10:50 AM 10/09/2022    9:06 AM 10/09/2022    9:04 AM  CMP  Glucose 70 - 99 mg/dL 840   808   BUN 6 - 20 mg/dL 21   17   Creatinine 9.55 - 1.00 mg/dL 9.11   9.18   Sodium 864 - 145 mmol/L 138   137   Potassium 3.5 - 5.1 mmol/L 4.2   4.1   Chloride 98 - 111 mmol/L 102   103   CO2 22 - 32 mmol/L 25   26   Calcium 8.9 - 10.3 mg/dL 89.2  89.1  89.6   Total Protein 6.5 - 8.1 g/dL 7.9   7.7   Total Bilirubin 0.3 - 1.2 mg/dL 0.7   0.5   Alkaline Phos 38 - 126 U/L 60   63   AST 15 - 41 U/L 44   49   ALT 0 - 44 U/L 53   49     DIAGNOSTIC IMAGING:  I have independently reviewed the scans and discussed with the patient. No results found.   WRAP UP:  All questions were answered. The patient knows to call the clinic with any problems, questions or concerns.  Medical decision making: ***  Time spent on visit: I spent {CHL ONC TIME VISIT - DTPQU:8845999869} counseling the patient face to face. The total time spent in the appointment was {CHL ONC TIME VISIT - DTPQU:[id number]} and more than 50% was on counseling.  Pleasant CHRISTELLA Barefoot, PA-C  ***

## 2024-06-10 ENCOUNTER — Inpatient Hospital Stay: Admitting: Physician Assistant

## 2024-06-10 ENCOUNTER — Other Ambulatory Visit

## 2024-06-10 ENCOUNTER — Inpatient Hospital Stay: Attending: Physician Assistant

## 2024-06-10 VITALS — BP 137/81 | HR 71 | Temp 97.8°F | Resp 18 | Wt 244.9 lb

## 2024-06-10 DIAGNOSIS — G4730 Sleep apnea, unspecified: Secondary | ICD-10-CM | POA: Insufficient documentation

## 2024-06-10 DIAGNOSIS — D696 Thrombocytopenia, unspecified: Secondary | ICD-10-CM | POA: Insufficient documentation

## 2024-06-10 DIAGNOSIS — Z87442 Personal history of urinary calculi: Secondary | ICD-10-CM | POA: Diagnosis not present

## 2024-06-10 DIAGNOSIS — Z1721 Progesterone receptor positive status: Secondary | ICD-10-CM | POA: Diagnosis not present

## 2024-06-10 DIAGNOSIS — Z1731 Human epidermal growth factor receptor 2 positive status: Secondary | ICD-10-CM | POA: Insufficient documentation

## 2024-06-10 DIAGNOSIS — Z7984 Long term (current) use of oral hypoglycemic drugs: Secondary | ICD-10-CM | POA: Insufficient documentation

## 2024-06-10 DIAGNOSIS — Z9221 Personal history of antineoplastic chemotherapy: Secondary | ICD-10-CM | POA: Insufficient documentation

## 2024-06-10 DIAGNOSIS — Z794 Long term (current) use of insulin: Secondary | ICD-10-CM | POA: Insufficient documentation

## 2024-06-10 DIAGNOSIS — R232 Flushing: Secondary | ICD-10-CM | POA: Insufficient documentation

## 2024-06-10 DIAGNOSIS — Z79899 Other long term (current) drug therapy: Secondary | ICD-10-CM

## 2024-06-10 DIAGNOSIS — C50211 Malignant neoplasm of upper-inner quadrant of right female breast: Secondary | ICD-10-CM | POA: Diagnosis not present

## 2024-06-10 DIAGNOSIS — K219 Gastro-esophageal reflux disease without esophagitis: Secondary | ICD-10-CM | POA: Insufficient documentation

## 2024-06-10 DIAGNOSIS — Z8041 Family history of malignant neoplasm of ovary: Secondary | ICD-10-CM | POA: Insufficient documentation

## 2024-06-10 DIAGNOSIS — E119 Type 2 diabetes mellitus without complications: Secondary | ICD-10-CM | POA: Insufficient documentation

## 2024-06-10 DIAGNOSIS — R7989 Other specified abnormal findings of blood chemistry: Secondary | ICD-10-CM | POA: Diagnosis not present

## 2024-06-10 DIAGNOSIS — C773 Secondary and unspecified malignant neoplasm of axilla and upper limb lymph nodes: Secondary | ICD-10-CM | POA: Diagnosis not present

## 2024-06-10 DIAGNOSIS — Z923 Personal history of irradiation: Secondary | ICD-10-CM | POA: Diagnosis not present

## 2024-06-10 DIAGNOSIS — R011 Cardiac murmur, unspecified: Secondary | ICD-10-CM | POA: Insufficient documentation

## 2024-06-10 DIAGNOSIS — E785 Hyperlipidemia, unspecified: Secondary | ICD-10-CM | POA: Insufficient documentation

## 2024-06-10 DIAGNOSIS — M8580 Other specified disorders of bone density and structure, unspecified site: Secondary | ICD-10-CM | POA: Insufficient documentation

## 2024-06-10 DIAGNOSIS — Z170 Estrogen receptor positive status [ER+]: Secondary | ICD-10-CM

## 2024-06-10 DIAGNOSIS — Z801 Family history of malignant neoplasm of trachea, bronchus and lung: Secondary | ICD-10-CM | POA: Diagnosis not present

## 2024-06-10 DIAGNOSIS — K760 Fatty (change of) liver, not elsewhere classified: Secondary | ICD-10-CM | POA: Diagnosis not present

## 2024-06-10 DIAGNOSIS — I10 Essential (primary) hypertension: Secondary | ICD-10-CM | POA: Diagnosis not present

## 2024-06-10 DIAGNOSIS — Z79811 Long term (current) use of aromatase inhibitors: Secondary | ICD-10-CM | POA: Diagnosis not present

## 2024-06-10 LAB — COMPREHENSIVE METABOLIC PANEL WITH GFR
ALT: 45 U/L — ABNORMAL HIGH (ref 0–44)
AST: 35 U/L (ref 15–41)
Albumin: 4.1 g/dL (ref 3.5–5.0)
Alkaline Phosphatase: 107 U/L (ref 38–126)
Anion gap: 12 (ref 5–15)
BUN: 19 mg/dL (ref 6–20)
CO2: 26 mmol/L (ref 22–32)
Calcium: 11.9 mg/dL — ABNORMAL HIGH (ref 8.9–10.3)
Chloride: 102 mmol/L (ref 98–111)
Creatinine, Ser: 0.85 mg/dL (ref 0.44–1.00)
GFR, Estimated: >60 mL/min (ref >60)
Glucose, Bld: 148 mg/dL — ABNORMAL HIGH (ref 70–99)
Potassium: 4.2 mmol/L (ref 3.5–5.1)
Sodium: 140 mmol/L (ref 135–145)
Total Bilirubin: 0.6 mg/dL (ref 0.0–1.2)
Total Protein: 7.8 g/dL (ref 6.5–8.1)

## 2024-06-10 LAB — CBC
HCT: 43.5 % (ref 36.0–46.0)
Hemoglobin: 14.1 g/dL (ref 12.0–15.0)
MCH: 27.3 pg (ref 26.0–34.0)
MCHC: 32.4 g/dL (ref 30.0–36.0)
MCV: 84.3 fL (ref 80.0–100.0)
Platelets: 197 K/uL (ref 150–400)
RBC: 5.16 MIL/uL — ABNORMAL HIGH (ref 3.87–5.11)
RDW: 14 % (ref 11.5–15.5)
WBC: 6.9 K/uL (ref 4.0–10.5)
nRBC: 0 % (ref 0.0–0.2)

## 2024-06-10 LAB — VITAMIN B12: Vitamin B-12: 416 pg/mL (ref 180–914)

## 2024-06-10 LAB — VITAMIN D 25 HYDROXY (VIT D DEFICIENCY, FRACTURES): Vit D, 25-Hydroxy: 25.53 ng/mL — ABNORMAL LOW (ref 30–100)

## 2024-06-10 NOTE — Patient Instructions (Signed)
 [facility] **VISIT SUMMARY & IMPORTANT INSTRUCTIONS **   You were seen today by Pleasant Barefoot PA-C for your breast cancer follow-up.   You did not have any evidence of recurrent breast cancer on your most recent labs, mammogram, or physical exam. Follow-up with your primary care provider regarding chronically elevated calcium levels.  You may need to be referred to an endocrinologist to determine why your calcium is elevated. You are due for bone density scan. Your next mammogram will be in May 2026. We will repeat labs and see you for office visit in 1 year.  FOLLOW-UP APPOINTMENT: 1 year  ** Thank you for trusting me with your healthcare!  I strive to provide all of my patients with quality care at each visit.  If you receive a survey for this visit, I would be so grateful to you for taking the time to provide feedback.  Thank you in advance!  ~ Guage Efferson                                        Dr. Mickiel Davonna Pleasant Barefoot, PA-C          Delon Hope, NP   - - - - - - - - - - - - - - - - - -    Thank you for choosing [facility] to provide your oncology and hematology care.  To afford each patient quality time with our provider, please arrive at least 15 minutes before your scheduled appointment time.   If you have a lab appointment with the Cancer Center please come in thru the Main Entrance and check in at the main information desk.  You need to re-schedule your appointment should you arrive 10 or more minutes late.  We strive to give you quality time with our providers, and arriving late affects you and other patients whose appointments are after yours.  Also, if you no show three or more times for appointments you may be dismissed from the clinic at the providers discretion.     Again, thank you for choosing [facility].  Our hope is that these requests will decrease the amount  of time that you wait before being seen by our physicians.       _____________________________________________________________  Should you have questions after your visit to [facility], please contact our office at [phone] and follow the prompts.  Our office hours are 8:00 a.m. and 4:30 p.m. Monday - Friday.  Please note that voicemails left after 4:00 p.m. may not be returned until the following business day.  We are closed weekends and major holidays.  You do have access to a nurse 24-7, just call the main number to the clinic [phone] and do not press any options, hold on the line and a nurse will answer the phone.    For prescription refill requests, have your pharmacy contact our office and allow 72 hours.

## 2024-06-13 ENCOUNTER — Ambulatory Visit: Payer: Self-pay | Admitting: Oncology

## 2024-06-15 ENCOUNTER — Other Ambulatory Visit: Payer: Self-pay | Admitting: *Deleted

## 2024-06-15 MED ORDER — ERGOCALCIFEROL 1.25 MG (50000 UT) PO CAPS: 50000.0000 [IU] | ORAL_CAPSULE | ORAL | 0 refills | Status: DC

## 2024-06-15 NOTE — Progress Notes (Signed)
 Script sent to patient's pharmacy.  LVM for return call.

## 2024-06-15 NOTE — Progress Notes (Signed)
 Can we switch her to the ergocalciferol  50,000 units weekly?  Do mind sending the prescription in for me and I will sign off on it.  Thank you so much.  Delon Hope, NP 06/15/2024 8:51 AM

## 2024-06-16 ENCOUNTER — Ambulatory Visit: Admitting: Internal Medicine

## 2024-06-20 ENCOUNTER — Other Ambulatory Visit (HOSPITAL_COMMUNITY)

## 2024-06-20 ENCOUNTER — Encounter: Payer: Self-pay | Admitting: *Deleted

## 2024-06-20 DIAGNOSIS — I10 Essential (primary) hypertension: Secondary | ICD-10-CM | POA: Diagnosis not present

## 2024-06-20 DIAGNOSIS — Z6841 Body Mass Index (BMI) 40.0 and over, adult: Secondary | ICD-10-CM | POA: Diagnosis not present

## 2024-06-20 DIAGNOSIS — R011 Cardiac murmur, unspecified: Secondary | ICD-10-CM | POA: Diagnosis not present

## 2024-06-20 DIAGNOSIS — E782 Mixed hyperlipidemia: Secondary | ICD-10-CM | POA: Diagnosis not present

## 2024-06-20 DIAGNOSIS — E1159 Type 2 diabetes mellitus with other circulatory complications: Secondary | ICD-10-CM | POA: Diagnosis not present

## 2024-06-20 NOTE — Progress Notes (Signed)
 VM left for patient as well as [facility] with instructions.

## 2024-06-21 ENCOUNTER — Other Ambulatory Visit (HOSPITAL_COMMUNITY)

## 2024-06-22 NOTE — Progress Notes (Signed)
 Patient notified and verbalized understanding.

## 2024-06-23 ENCOUNTER — Ambulatory Visit: Payer: Self-pay | Admitting: Physician Assistant

## 2024-06-23 ENCOUNTER — Ambulatory Visit (HOSPITAL_COMMUNITY)
Admission: RE | Admit: 2024-06-23 | Discharge: 2024-06-23 | Disposition: A | Discharge status: Home / Self Care | Source: Ambulatory Visit | Attending: Physician Assistant | Admitting: Physician Assistant

## 2024-06-23 DIAGNOSIS — Z170 Estrogen receptor positive status [ER+]: Secondary | ICD-10-CM | POA: Insufficient documentation

## 2024-06-23 DIAGNOSIS — Z780 Asymptomatic menopausal state: Secondary | ICD-10-CM | POA: Diagnosis not present

## 2024-06-23 DIAGNOSIS — C50211 Malignant neoplasm of upper-inner quadrant of right female breast: Secondary | ICD-10-CM | POA: Diagnosis not present

## 2024-06-23 DIAGNOSIS — Z79899 Other long term (current) drug therapy: Secondary | ICD-10-CM | POA: Diagnosis not present

## 2024-06-23 DIAGNOSIS — M810 Age-related osteoporosis without current pathological fracture: Secondary | ICD-10-CM | POA: Diagnosis not present

## 2024-06-23 NOTE — Progress Notes (Signed)
 Worsening bone density with lowest T-score -2.6 (right femoral neck) meeting criteria for osteoporosis.  We will schedule patient in the near future to discuss these results in person and go over risks and benefits of possible treatment options.  Pleasant CHRISTELLA Barefoot, PA-C 06/23/24 6:42 PM

## 2024-06-27 NOTE — Progress Notes (Unsigned)
 [facility] [address], [place] [address]   CLINIC:  Medical Oncology/Hematology  PCP:  Marvine Rush, MD [address] [place] [address] [phone]  INTERVAL HISTORY:   Renee Harris 63 y.o. female follows at [facility] for history of right breast cancer.  She was last seen by Pleasant Barefoot PA-C on 06/10/2024.  She had bone density scan on 06/23/2024 which showed worsening bone density with T-score -2.6 (right femoral neck), meeting criteria for osteoporosis.  She was brought back to clinic today to discuss these results and treatment options.  At today's visit, she reports feeling fair. She denies any interval change in her health or symptoms since her visit last month.  ASSESSMENT & PLAN:  1.  Osteoporosis - T-score -1.5 on 08/25/2018. - DEXA/bone density scan on 11/22/2020: T-score -2.2. - DEXA/bone density scan (06/23/2024): T-score -2.6 (right femoral neck), osteoporosis - She remains on anastrozole  since July 2019 - She is taking vitamin D  2000 units daily - at last visit was unable to increase dose of vitamin D  due to mildly elevated calcium and history of calcium kidney stones.  She has chronically elevated calcium for many years. - Labs from 06/10/2024 showed low vitamin D  at 25.53, but calcium elevated 11.9.  She was switched to vitamin D  50,000 units weekly by other provider. - Recommend treatment with Prolia injections every 6 months.   - Discussed with patient that Prolia injections are generally well-tolerated, but side effects could include muscle and joint pain, transient flulike symptoms, low calcium, and injection site irritation.  Rare but serious side effects could include osteonecrosis of the jaw. - PLAN:  Potential risks and side effects of Prolia discussed with patient.  She agrees to proceed with treatment. - Patient instructed to obtain dental clearance for Prolia.   - We will tentatively plan on CMP/Prolia in November 2025,  and again in May 2026  - We will STOP vitamin D  50,000 units that was entered by other provider, due to elevated calcium.   Continue vitamin D  2000 units daily.  2.  Hypercalcemia - She has chronically elevated calcium for many years, and history of calcium kidney stones - She is taking vitamin D  2000 units daily - at last visit was unable to increase dose of vitamin D  due to mildly elevated calcium  - Labs from 06/10/2024 showed low vitamin D  at 25.53, but calcium elevated 11.9.  She was switched to vitamin D  50,000 units weekly by other provider. - She is up-to-date on age-appropriate cancer screenings  Annual mammogram as above Colonoscopy on 04/27/2024 ([facility]) with precancerous adenomatous polyps x 7, planning for repeat colonoscopy in 3 years  - PLAN: Labs today = intact PTH, phosphorus, SPEP, immunofixation, light chains, PTHrP, vitamin D -1,25, BMP - 24-hour urine calcium and creatinine  - We will STOP vitamin D  50,000 units that was entered by other provider, due to elevated calcium.  Continue vitamin D  2000 units daily.     ** OTHER ISSUES NOT addressed during this visit (see full note from 06/10/2024) - History of right breast cancer: Mammogram as scheduled in May 2026.  Labs and RTC as scheduled in August 2026.  PLAN SUMMARY: >> Labs today = intact PTH, phosphorus, SPEP, immunofixation, light chains, PTHrP, vitamin D -1,25, BMP >> 24-hour urine calcium and creatinine  >> CMP + Prolia every 6 months (first dose around last week of November 2025, so the next dose could be scheduled same day as her mammogram on  03/08/2025) >> Labs and office visit as scheduled in August 2026     REVIEW OF SYSTEMS:   Review of Systems  Constitutional:  Positive for fatigue. Negative for appetite change, chills, diaphoresis, fever and unexpected weight change.  HENT:   Negative for lump/mass and nosebleeds.   Eyes:  Negative for eye problems.  Respiratory:  Positive for cough. Negative for hemoptysis  and shortness of breath.   Cardiovascular:  Negative for chest pain, leg swelling and palpitations.  Gastrointestinal:  Positive for constipation and diarrhea. Negative for abdominal pain, blood in stool, nausea and vomiting.  Genitourinary:  Negative for hematuria.   Musculoskeletal:  Positive for arthralgias.  Skin: Negative.   Neurological:  Positive for headaches and numbness. Negative for dizziness and light-headedness.  Hematological:  Does not bruise/bleed easily.     PHYSICAL EXAM:  ECOG PERFORMANCE STATUS: 1 - Symptomatic but completely ambulatory  Vitals:   06/28/24 0833  BP: (!) 147/82  Pulse: 78  Resp: 16  Temp: 97.8 F (36.6 C)  SpO2: 98%   Filed Weights   06/28/24 0833  Weight: 244 lb 4.3 oz (110.8 kg)   Physical Exam Constitutional:      Appearance: Normal appearance. She is obese.  Cardiovascular:     Heart sounds: Normal heart sounds.  Pulmonary:     Breath sounds: Normal breath sounds.  Neurological:     General: No focal deficit present.     Mental Status: Mental status is at baseline.  Psychiatric:        Behavior: Behavior normal. Behavior is cooperative.     PAST MEDICAL/SURGICAL HISTORY:  Past Medical History:  Diagnosis Date   Asthma    Blood transfusion    as child   Breast cancer (HCC) 2018   Cancer (HCC)    Diabetes (HCC)    type 2    GERD (gastroesophageal reflux disease)    Headache    Heart murmur    d/t aortic stenosis    History of kidney stones    Hyperlipidemia    Hypertension    Normal echocardiogram    Personal history of chemotherapy 2018   Personal history of radiation therapy 2018   Sleep apnea    Past Surgical History:  Procedure Laterality Date   ABDOMINAL HYSTERECTOMY  2004   BREAST LUMPECTOMY Right 2018   BREAST LUMPECTOMY WITH RADIOACTIVE SEED AND SENTINEL LYMPH NODE BIOPSY Right 12/09/2017   Procedure: RIGHT BREAST LUMPECTOMY WITH RADIOACTIVE SEED AND SENTINEL LYMPH NODE BIOPSY AND RADIOACTIVE SEED  TARGETED RIGHT AXILLARY LYMPH NODE EXCISION;  Surgeon: Ethyl Lenis, MD;  Location: [facility] OR;  Service: General;  Laterality: Right;   CHOLECYSTECTOMY  05/30/2011   Procedure: LAPAROSCOPIC CHOLECYSTECTOMY;  Surgeon: Thresa JAYSON Pulling;  Location: [facility];  Service: General;  Laterality: N/A;   EYE SURGERY     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, on both eyes   KIDNEY STONE SURGERY     2017 2-23   PORT-A-CATH REMOVAL Left 05/10/2020   Procedure: REMOVAL OF POWER PORT;  Surgeon: Ethyl Lenis, MD;  Location: [facility];  Service: General;  Laterality: Left;   PORTACATH PLACEMENT N/A 05/25/2017   Procedure: INSERTION PORT-A-CATH;  Surgeon: Ethyl Lenis, MD;  Location: [facility];  Service: General;  Laterality: N/A;    SOCIAL HISTORY:  Social History   Socioeconomic History   Marital status: Married    Spouse name: Not on file   Number of children: Not on file   Years of education:  Not on file   Highest education level: Not on file  Occupational History   Not on file  Tobacco Use   Smoking status: Never   Smokeless tobacco: Never  Vaping Use   Vaping status: Never Used  Substance and Sexual Activity   Alcohol use: Not Currently   Drug use: No   Sexual activity: Yes    Birth control/protection: Surgical  Other Topics Concern   Not on file  Social History Narrative   Not on file   Social Drivers of Health   Financial Resource Strain: Not on file  Food Insecurity: Not on file  Transportation Needs: Not on file  Physical Activity: Not on file  Stress: Not on file  Social Connections: Not on file  Intimate Partner Violence: Not At Risk (05/25/2018)   Humiliation, Afraid, Rape, and Kick questionnaire    Fear of Current or Ex-Partner: No    Emotionally Abused: No    Physically Abused: No    Sexually Abused: No    FAMILY HISTORY:  Family History  Problem Relation Age of Onset   Hypertension Mother    Diabetes Mother    Heart attack Mother    Hypertension Father     Diabetes Father    Lung cancer Maternal Grandmother    Anesthesia problems Neg Hx    Hypotension Neg Hx    Malignant hyperthermia Neg Hx    Pseudochol deficiency Neg Hx     CURRENT MEDICATIONS:  Outpatient Encounter Medications as of 06/28/2024  Medication Sig   albuterol (PROVENTIL HFA;VENTOLIN HFA) 108 (90 BASE) MCG/ACT inhaler Inhale 1 puff into the lungs every 6 (six) hours as needed for wheezing or shortness of breath.   anastrozole  (ARIMIDEX ) 1 MG tablet TAKE (1) TABLET BY MOUTH ONCE DAILY.   celecoxib (CELEBREX) 200 MG capsule Take 200 mg by mouth daily as needed.   cyclobenzaprine (FLEXERIL) 10 MG tablet Take 10 mg by mouth every 8 (eight) hours as needed.   escitalopram (LEXAPRO) 20 MG tablet Take 10 mg by mouth at bedtime.    fenofibrate 160 MG tablet Take 160 mg by mouth at bedtime.    glipiZIDE  (GLUCOTROL  XL) 5 MG 24 hr tablet Take 1 tablet (5 mg total) by mouth daily with breakfast.   glucose blood (ONETOUCH VERIO) test strip Use as instructed to monitor glucose 4 times daily   insulin  NPH-regular Human (NOVOLIN 70/30) (70-30) 100 UNIT/ML injection 40 units Subcutaneous at night   losartan (COZAAR) 50 MG tablet Take 25 mg by mouth at bedtime.   meclizine  (ANTIVERT ) 25 MG tablet Take 1 tablet (25 mg total) by mouth 3 (three) times daily as needed for dizziness.   Omega-3 Fatty Acids (FISH OIL) 1200 MG CAPS Take 2,400 mg by mouth daily.   omeprazole  (PRILOSEC) 40 MG capsule Take 1 capsule (40 mg total) by mouth daily.   Skin Protectants, Misc. (EUCERIN) cream Apply 1 application. topically as needed for dry skin.   SURE COMFORT PEN NEEDLES 31G X 8 MM MISC USING DAILY WITH TRESBIA.   TECHLITE INSULIN  SYRINGE 31G X 15/64 0.5 ML MISC USE TO INJECT INSULIN  EVERY DAY   [DISCONTINUED] ergocalciferol  (VITAMIN D2) 1.25 MG (50000 UT) capsule Take 1 capsule (50,000 Units total) by mouth once a week.   No facility-administered encounter medications on file as of 06/28/2024.     ALLERGIES:  Allergies  Allergen Reactions   Mestinon [Pyridostigmine]    Latex Itching   Pyridostigmine Bromide Other (See Comments)  Makes muscles twitch   Statins Nausea Only    LABORATORY DATA:  I have reviewed the labs as listed.  CBC    Component Value Date/Time   WBC 6.9 06/10/2024 1031   RBC 5.16 (H) 06/10/2024 1031   HGB 14.1 06/10/2024 1031   HGB 13.8 04/22/2017 0831   HCT 43.5 06/10/2024 1031   HCT 41.9 04/22/2017 0831   PLT 197 06/10/2024 1031   PLT 184 04/22/2017 0831   MCV 84.3 06/10/2024 1031   MCV 85.7 04/22/2017 0831   MCH 27.3 06/10/2024 1031   MCHC 32.4 06/10/2024 1031   RDW 14.0 06/10/2024 1031   RDW 13.9 04/22/2017 0831   LYMPHSABS 1.8 04/02/2023 1050   LYMPHSABS 2.1 04/22/2017 0831   MONOABS 0.4 04/02/2023 1050   MONOABS 0.5 04/22/2017 0831   EOSABS 0.4 04/02/2023 1050   EOSABS 0.3 04/22/2017 0831   BASOSABS 0.0 04/02/2023 1050   BASOSABS 0.0 04/22/2017 0831      Latest Ref Rng & Units 06/10/2024   10:31 AM 04/02/2023   10:50 AM 10/09/2022    9:06 AM  CMP  Glucose 70 - 99 mg/dL 851  840    BUN 6 - 20 mg/dL 19  21    Creatinine 9.55 - 1.00 mg/dL 9.14  9.11    Sodium 864 - 145 mmol/L 140  138    Potassium 3.5 - 5.1 mmol/L 4.2  4.2    Chloride 98 - 111 mmol/L 102  102    CO2 22 - 32 mmol/L 26  25    Calcium 8.9 - 10.3 mg/dL 88.0  89.2  89.1   Total Protein 6.5 - 8.1 g/dL 7.8  7.9    Total Bilirubin 0.0 - 1.2 mg/dL 0.6  0.7    Alkaline Phos 38 - 126 U/L 107  60    AST 15 - 41 U/L 35  44    ALT 0 - 44 U/L 45  53      DIAGNOSTIC IMAGING:  I have independently reviewed the relevant imaging and discussed with the patient.   WRAP UP:  All questions were answered. The patient knows to call the clinic with any problems, questions or concerns.  Medical decision making: Moderate  Time spent on visit: I spent 20 minutes counseling the patient face to face. The total time spent in the appointment was 30 minutes and more than 50% was on  counseling.  Pleasant CHRISTELLA Barefoot, PA-C  06/28/24 9:45 AM

## 2024-06-28 ENCOUNTER — Other Ambulatory Visit

## 2024-06-28 ENCOUNTER — Inpatient Hospital Stay: Attending: Physician Assistant | Admitting: Physician Assistant

## 2024-06-28 ENCOUNTER — Inpatient Hospital Stay

## 2024-06-28 ENCOUNTER — Encounter: Payer: Self-pay | Admitting: Physician Assistant

## 2024-06-28 VITALS — BP 147/82 | HR 78 | Temp 97.8°F | Resp 16 | Wt 244.3 lb

## 2024-06-28 DIAGNOSIS — M810 Age-related osteoporosis without current pathological fracture: Secondary | ICD-10-CM | POA: Diagnosis not present

## 2024-06-28 DIAGNOSIS — Z853 Personal history of malignant neoplasm of breast: Secondary | ICD-10-CM | POA: Insufficient documentation

## 2024-06-28 DIAGNOSIS — Z170 Estrogen receptor positive status [ER+]: Secondary | ICD-10-CM | POA: Diagnosis not present

## 2024-06-28 DIAGNOSIS — C50211 Malignant neoplasm of upper-inner quadrant of right female breast: Secondary | ICD-10-CM

## 2024-06-28 LAB — BASIC METABOLIC PANEL WITH GFR
Anion gap: 10 (ref 5–15)
BUN: 23 mg/dL — ABNORMAL HIGH (ref 6–20)
CO2: 24 mmol/L (ref 22–32)
Calcium: 10.9 mg/dL — ABNORMAL HIGH (ref 8.9–10.3)
Chloride: 105 mmol/L (ref 98–111)
Creatinine, Ser: 0.85 mg/dL (ref 0.44–1.00)
GFR, Estimated: >60 mL/min (ref >60)
Glucose, Bld: 194 mg/dL — ABNORMAL HIGH (ref 70–99)
Potassium: 4.1 mmol/L (ref 3.5–5.1)
Sodium: 139 mmol/L (ref 135–145)

## 2024-06-28 LAB — PHOSPHORUS: Phosphorus: 2.6 mg/dL (ref 2.5–4.6)

## 2024-06-28 NOTE — Addendum Note (Signed)
 Addended by: LAMON HERTER on: 06/28/2024 10:04 AM   Modules accepted: Orders

## 2024-06-28 NOTE — Patient Instructions (Signed)
 [facility] at [facility] **VISIT SUMMARY & IMPORTANT INSTRUCTIONS **   You were seen today by Pleasant Barefoot PA-C for your osteoporosis.    OSTEOPOROSIS: Osteoporosis (weak and fragile bones) is common in postmenopausal women, but can be worsened by the antiestrogen breast cancer pill that you are taking. Your most recent bone density scan showed worsening bone density. We recommend that you start PROLIA INJECTIONS to improve your bone strength.  These injections are given in our office every 6 months. Please see attached handout for important information on potential side effects of Prolia. You will need to see your DENTIST to obtain DENTAL CLEARANCE before starting Prolia. Please make an appointment with your dentist within the next 30 days and ask your dentist to FAX note with Prolia clearance to our office at [phone]. We will tentatively schedule you for your first Prolia injection in about 2 months, but we MUST receive clearance from your dentist prior to your first injection.  ELEVATED CALCIUM We will check some additional labs to see why your calcium levels are elevated. We will also check a 24-hour urine study.  Kit has been provided for you. FIRST MORNING: Discard first urine of the morning into the toilet. Collect the rest of your urine in the orange jug for the next 24 hours. SECOND MORNING: Collect your first urine of the morning in the jug.  This ends your 24-hour urine collection. **Store the urine jug in the refrigerator but is not being used. Return urine jug to fourth floor front desk as soon as it is completed.  Please STOP taking the vitamin D  50,000 units weekly that was recently prescribed by another provider.  This could make your calcium levels worse. Continue taking vitamin D  2000 units daily.   FOLLOW-UP APPOINTMENT: Labs and office visit as scheduled in August 2026.  (We will call you to discuss results of the labs we are checking  today, and if there are any significant findings, we will bring you in for another appointment.)  ** Thank you for trusting me with your healthcare!  I strive to provide all of my patients with quality care at each visit.  If you receive a survey for this visit, I would be so grateful to you for taking the time to provide feedback.  Thank you in advance!  ~ Laydon Martis                                        Dr. Mickiel Davonna Pleasant Barefoot, PA-C          Delon Hope, NP   - - - - - - - - - - - - - - - - - -    Thank you for choosing [facility] at [facility] to provide your oncology and hematology care.  To afford each patient quality time with our provider, please arrive at least 15 minutes before your scheduled appointment time.   If you have a lab appointment with the Cancer Center please come in thru the Main Entrance and check in at the main information desk.  You need to re-schedule your appointment should you arrive 10 or more minutes late.  We strive to give you quality time with our providers, and arriving late affects you and other patients whose appointments are after yours.  Also, if  you no show three or more times for appointments you may be dismissed from the clinic at the providers discretion.     Again, thank you for choosing [facility].  Our hope is that these requests will decrease the amount of time that you wait before being seen by our physicians.       _____________________________________________________________  Should you have questions after your visit to [facility], please contact our office at [phone] and follow the prompts.  Our office hours are 8:00 a.m. and 4:30 p.m. Monday - Friday.  Please note that voicemails left after 4:00 p.m. may not be returned until the following business day.  We are closed weekends and major holidays.  You do have access to a nurse 24-7, just call the main number to the  clinic [phone] and do not press any options, hold on the line and a nurse will answer the phone.    For prescription refill requests, have your pharmacy contact our office and allow 72 hours.

## 2024-06-29 ENCOUNTER — Other Ambulatory Visit: Payer: Self-pay

## 2024-06-29 DIAGNOSIS — Z79899 Other long term (current) drug therapy: Secondary | ICD-10-CM

## 2024-06-29 DIAGNOSIS — Z170 Estrogen receptor positive status [ER+]: Secondary | ICD-10-CM

## 2024-06-29 DIAGNOSIS — M810 Age-related osteoporosis without current pathological fracture: Secondary | ICD-10-CM

## 2024-06-29 LAB — PROTEIN ELECTROPHORESIS, SERUM
A/G Ratio: 1 (ref 0.7–1.7)
Albumin ELP: 3.6 g/dL (ref 2.9–4.4)
Alpha-1-Globulin: 0.3 g/dL (ref 0.0–0.4)
Alpha-2-Globulin: 0.9 g/dL (ref 0.4–1.0)
Beta Globulin: 1.4 g/dL — ABNORMAL HIGH (ref 0.7–1.3)
Gamma Globulin: 1.1 g/dL (ref 0.4–1.8)
Globulin, Total: 3.7 g/dL (ref 2.2–3.9)
Total Protein ELP: 7.3 g/dL (ref 6.0–8.5)

## 2024-06-29 LAB — KAPPA/LAMBDA LIGHT CHAINS
Kappa free light chain: 30.8 mg/L — ABNORMAL HIGH (ref 3.3–19.4)
Kappa, lambda light chain ratio: 1.34 (ref 0.26–1.65)
Lambda free light chains: 22.9 mg/L (ref 5.7–26.3)

## 2024-06-29 LAB — PTH, INTACT AND CALCIUM
Calcium, Total (PTH): 11.3 mg/dL — ABNORMAL HIGH (ref 8.7–10.3)
PTH: 38 pg/mL (ref 15–65)

## 2024-06-30 ENCOUNTER — Other Ambulatory Visit: Payer: Self-pay

## 2024-06-30 ENCOUNTER — Inpatient Hospital Stay

## 2024-06-30 DIAGNOSIS — Z79899 Other long term (current) drug therapy: Secondary | ICD-10-CM

## 2024-06-30 DIAGNOSIS — C50211 Malignant neoplasm of upper-inner quadrant of right female breast: Secondary | ICD-10-CM

## 2024-06-30 DIAGNOSIS — M810 Age-related osteoporosis without current pathological fracture: Secondary | ICD-10-CM

## 2024-06-30 DIAGNOSIS — Z853 Personal history of malignant neoplasm of breast: Secondary | ICD-10-CM | POA: Diagnosis not present

## 2024-06-30 LAB — CREATININE, URINE, 24 HOUR
Collection Interval-UCRE24: 24 h
Creatinine, 24H Ur: 1472 mg/d (ref 600–1800)
Creatinine, Urine: 54 mg/dL
Urine Total Volume-UCRE24: 2725 mL

## 2024-07-01 LAB — CALCIUM, URINE, 24 HOUR
Calcium, 24 hour urine: 548 mg/(24.h) — ABNORMAL HIGH (ref 0–320)
Calcium, Ur: 20.1 mg/dL
Total Volume: 2725

## 2024-07-03 LAB — IMMUNOFIXATION ELECTROPHORESIS
IgA: 291 mg/dL (ref 87–352)
IgG (Immunoglobin G), Serum: 982 mg/dL (ref 586–1602)
IgM (Immunoglobulin M), Srm: 247 mg/dL — ABNORMAL HIGH (ref 26–217)
Total Protein ELP: 7.3 g/dL (ref 6.0–8.5)

## 2024-07-08 LAB — PTH-RELATED PEPTIDE: PTH-related peptide: <2 pmol/L

## 2024-07-11 LAB — VITAMIN D 1,25 DIHYDROXY
Vitamin D 1, 25 (OH)2 Total: 120 pg/mL — ABNORMAL HIGH
Vitamin D2 1, 25 (OH)2: <10 pg/mL
Vitamin D3 1, 25 (OH)2: 118 pg/mL

## 2024-07-14 ENCOUNTER — Ambulatory Visit: Payer: Self-pay | Admitting: Physician Assistant

## 2024-07-14 NOTE — Progress Notes (Signed)
 WORKUP OF HYPERCALCEMIA: - PTH/calcium = calcium 11.3, PTH 38 (non-parathyroid hypercalcemia) - Normal phosphorus - SPEP negative for M spike.  Immunofixation showing polyclonal increase in immunoglobulins. - Normal FLC ratio 1.34 (mildly elevated kappa 30.8, lambda 22.9) - Normal PTH related peptide - Vitamin D  1, 25 dihydroxy elevated at 120 (vitamin D  25-hydroxy is low at 25.53) 24-hour urine with calcium/creatinine ratio showed ratio 0.028, which makes familial hypocalciuric hypercalcemia unlikely.  Differential does include lymphoma, granulomatous diseases (such as sarcoidosis or tuberculosis), which can cause hypercalcemia and elevations in D1,25 dihydroxy.  We will check CT CAP w/ contrast for further evaluation, and swill see patient for office visit to discuss results.  Pleasant Renee Barefoot, PA-C 07/14/24 1:01 PM

## 2024-08-01 ENCOUNTER — Ambulatory Visit (HOSPITAL_COMMUNITY)

## 2024-08-01 ENCOUNTER — Encounter (HOSPITAL_COMMUNITY): Payer: Self-pay

## 2024-08-09 ENCOUNTER — Inpatient Hospital Stay: Admitting: Physician Assistant

## 2024-09-02 ENCOUNTER — Inpatient Hospital Stay

## 2024-10-10 ENCOUNTER — Encounter: Payer: Self-pay | Admitting: *Deleted

## 2025-03-08 ENCOUNTER — Ambulatory Visit (HOSPITAL_COMMUNITY)

## 2025-06-05 ENCOUNTER — Other Ambulatory Visit

## 2025-06-12 ENCOUNTER — Ambulatory Visit: Admitting: Physician Assistant
